# Patient Record
Sex: Male | Born: 1935 | Race: White | Hispanic: No | Marital: Married | Smoking: Former smoker
Health system: Southern US, Community
[De-identification: ages and names within clinical notes are randomized; demographics above are authoritative.]

## PROBLEM LIST (undated history)

## (undated) DIAGNOSIS — K5282 Eosinophilic colitis: Secondary | ICD-10-CM

## (undated) DIAGNOSIS — E7800 Pure hypercholesterolemia, unspecified: Secondary | ICD-10-CM

## (undated) DIAGNOSIS — I10 Essential (primary) hypertension: Secondary | ICD-10-CM

## (undated) DIAGNOSIS — K219 Gastro-esophageal reflux disease without esophagitis: Secondary | ICD-10-CM

## (undated) DIAGNOSIS — M1990 Unspecified osteoarthritis, unspecified site: Secondary | ICD-10-CM

## (undated) DIAGNOSIS — H9190 Unspecified hearing loss, unspecified ear: Secondary | ICD-10-CM

## (undated) DIAGNOSIS — F0390 Unspecified dementia without behavioral disturbance: Secondary | ICD-10-CM

## (undated) HISTORY — DX: Eosinophilic colitis: K52.82

## (undated) HISTORY — PX: TONSILLECTOMY: SUR1361

## (undated) HISTORY — PX: KNEE ARTHROSCOPY: SUR90

---

## 2000-12-07 ENCOUNTER — Ambulatory Visit (HOSPITAL_COMMUNITY): Admission: RE | Admit: 2000-12-07 | Discharge: 2000-12-07 | Payer: Self-pay | Admitting: Orthopedic Surgery

## 2002-12-16 ENCOUNTER — Ambulatory Visit (HOSPITAL_COMMUNITY): Admission: RE | Admit: 2002-12-16 | Discharge: 2002-12-16 | Payer: Self-pay | Admitting: Orthopaedic Surgery

## 2002-12-16 ENCOUNTER — Encounter: Payer: Self-pay | Admitting: Orthopaedic Surgery

## 2003-07-18 ENCOUNTER — Ambulatory Visit (HOSPITAL_COMMUNITY): Admission: RE | Admit: 2003-07-18 | Discharge: 2003-07-18 | Payer: Self-pay | Admitting: Otolaryngology

## 2003-10-09 ENCOUNTER — Inpatient Hospital Stay (HOSPITAL_COMMUNITY): Admission: RE | Admit: 2003-10-09 | Discharge: 2003-10-13 | Payer: Self-pay | Admitting: Orthopedic Surgery

## 2004-11-26 ENCOUNTER — Ambulatory Visit (HOSPITAL_COMMUNITY): Admission: RE | Admit: 2004-11-26 | Discharge: 2004-11-26 | Payer: Self-pay | Admitting: Family Medicine

## 2005-02-07 ENCOUNTER — Ambulatory Visit (HOSPITAL_COMMUNITY): Admission: RE | Admit: 2005-02-07 | Discharge: 2005-02-07 | Payer: Self-pay | Admitting: Family Medicine

## 2005-10-17 ENCOUNTER — Ambulatory Visit (HOSPITAL_COMMUNITY): Admission: RE | Admit: 2005-10-17 | Discharge: 2005-10-17 | Payer: Self-pay | Admitting: Family Medicine

## 2006-01-30 ENCOUNTER — Ambulatory Visit (HOSPITAL_COMMUNITY): Admission: RE | Admit: 2006-01-30 | Discharge: 2006-01-30 | Payer: Self-pay | Admitting: Family Medicine

## 2007-01-11 ENCOUNTER — Encounter (INDEPENDENT_AMBULATORY_CARE_PROVIDER_SITE_OTHER): Payer: Self-pay | Admitting: Urology

## 2007-01-11 ENCOUNTER — Inpatient Hospital Stay (HOSPITAL_COMMUNITY): Admission: RE | Admit: 2007-01-11 | Discharge: 2007-01-13 | Payer: Self-pay | Admitting: Urology

## 2007-08-05 HISTORY — PX: COLONOSCOPY: SHX174

## 2007-11-02 ENCOUNTER — Ambulatory Visit: Payer: Self-pay | Admitting: Gastroenterology

## 2007-11-09 ENCOUNTER — Ambulatory Visit: Payer: Self-pay | Admitting: Gastroenterology

## 2007-11-09 ENCOUNTER — Ambulatory Visit (HOSPITAL_COMMUNITY): Admission: RE | Admit: 2007-11-09 | Discharge: 2007-11-09 | Payer: Self-pay | Admitting: Gastroenterology

## 2008-07-26 ENCOUNTER — Ambulatory Visit (HOSPITAL_COMMUNITY): Admission: RE | Admit: 2008-07-26 | Discharge: 2008-07-26 | Payer: Self-pay | Admitting: Family Medicine

## 2008-09-07 ENCOUNTER — Emergency Department (HOSPITAL_COMMUNITY): Admission: EM | Admit: 2008-09-07 | Discharge: 2008-09-07 | Payer: Self-pay | Admitting: Emergency Medicine

## 2010-08-24 ENCOUNTER — Encounter: Payer: Self-pay | Admitting: Otolaryngology

## 2010-08-25 ENCOUNTER — Encounter: Payer: Self-pay | Admitting: Family Medicine

## 2010-11-19 LAB — COMPREHENSIVE METABOLIC PANEL
ALT: 27 U/L (ref 0–53)
AST: 34 U/L (ref 0–37)
Albumin: 4 g/dL (ref 3.5–5.2)
Alkaline Phosphatase: 61 U/L (ref 39–117)
BUN: 10 mg/dL (ref 6–23)
CO2: 23 mEq/L (ref 19–32)
Calcium: 9.3 mg/dL (ref 8.4–10.5)
Chloride: 102 mEq/L (ref 96–112)
Creatinine, Ser: 0.8 mg/dL (ref 0.4–1.5)
GFR calc Af Amer: >60 mL/min (ref >60)
GFR calc non Af Amer: >60 mL/min (ref >60)
Glucose, Bld: 306 mg/dL — ABNORMAL HIGH (ref 70–99)
Potassium: 4.6 mEq/L (ref 3.5–5.1)
Sodium: 133 mEq/L — ABNORMAL LOW (ref 135–145)
Total Bilirubin: 0.4 mg/dL (ref 0.3–1.2)
Total Protein: 7.2 g/dL (ref 6.0–8.3)

## 2010-11-19 LAB — DIFFERENTIAL
Basophils Absolute: 0 10*3/uL (ref 0.0–0.1)
Basophils Relative: 0 % (ref 0–1)
Eosinophils Absolute: 0.1 10*3/uL (ref 0.0–0.7)
Eosinophils Relative: 1 % (ref 0–5)
Lymphocytes Relative: 21 % (ref 12–46)
Lymphs Abs: 1.6 10*3/uL (ref 0.7–4.0)
Monocytes Absolute: 0.5 10*3/uL (ref 0.1–1.0)
Monocytes Relative: 6 % (ref 3–12)
Neutro Abs: 5.7 10*3/uL (ref 1.7–7.7)
Neutrophils Relative %: 72 % (ref 43–77)

## 2010-11-19 LAB — CBC
HCT: 38.2 % — ABNORMAL LOW (ref 39.0–52.0)
Hemoglobin: 13 g/dL (ref 13.0–17.0)
MCHC: 34 g/dL (ref 30.0–36.0)
MCV: 81.9 fL (ref 78.0–100.0)
Platelets: 160 10*3/uL (ref 150–400)
RBC: 4.66 MIL/uL (ref 4.22–5.81)
RDW: 13.2 % (ref 11.5–15.5)
WBC: 7.9 10*3/uL (ref 4.0–10.5)

## 2010-11-19 LAB — PROTIME-INR
INR: 1 (ref 0.00–1.49)
Prothrombin Time: 13.3 seconds (ref 11.6–15.2)

## 2010-12-17 NOTE — Op Note (Signed)
[name], [name]              ACCOUNT NO.:  [id number]   MEDICAL RECORD NO.:  [id number]          PATIENT TYPE:  AMB   LOCATION:  DAY                           FACILITY:  [facility]   PHYSICIAN:  Kassie Mends, M.D.      DATE OF BIRTH:  11/23/1929   DATE OF PROCEDURE:  11/09/2007  DATE OF DISCHARGE:                                PROCEDURE NOTE   REFERRING PHYSICIAN:  Angus G. Renard Matter, MD   PROCEDURE:  Colonoscopy.   INDICATION FOR EXAMINATION:  [name] is a 77 year old male reportedly  with a history of polyps.  He has had change in his bowel habits.  He  says it is more difficult for him to pass stools.  He has a significant  past medical history of diabetes.  He takes cyclobenzaprine and  methocarbamol as an outpatient.   FINDINGS:  1. Small internal hemorrhoids.  2. Pancolonic diverticulosis, most pronounced in the left colon.   RECOMMENDATIONS:  1. [name] is to continue his chewable fiber supplements.  If his      chewable fiber supplements do not help with his difficulty having      bowel movements, then we would be more than happy to see him in      followup in 4-6 weeks.  2. He should follow a high-fiber diet.  He is given handout on high-      fiber diet, constipation, hemorrhoids, and diverticulosis.  3. Consider screening colonoscopy in 10-15 years.   PROCEDURE TECHNIQUE:  Physical exam was performed.  Informed consent was  obtained from the patient after explaining the benefits, risks, and  alternatives of the procedure.  The patient was connected to the monitor  and placed in left lateral position.  Continuous oxygen was provided by  nasal cannula and IV medicine administered through an indwelling  cannula.  After administration of sedation and rectal exam, the  patient's rectum was intubated  and the scope was advanced under direct visualization to the cecum.  The  scope was removed slowly by carefully examining the color, texture,  anatomy, and integrity  of the mucosa on the way out.  The patient was  recovered in endoscopy and discharged home in satisfactory condition.      Kassie Mends, M.D.  Electronically Signed     SM/MEDQ  D:  11/09/2007  T:  11/10/2007  Job:  [id number]   cc:   Angus G. Renard Matter, MD  Fax: [phone]

## 2010-12-17 NOTE — H&P (Signed)
NAMEKINGSLY, [name]              ACCOUNT NO.:  [id number]   MEDICAL RECORD NO.:  [id number]          PATIENT TYPE:  AMB   LOCATION:  DAY                           FACILITY:  [facility]   PHYSICIAN:  Dennie Maizes, M.D.   DATE OF BIRTH:  [birth year]   DATE OF ADMISSION:  01/11/2007  DATE OF DISCHARGE:  LH                              HISTORY & PHYSICAL   CHIEF COMPLAINT:  Low urinary stream, urinary frequency, nocturia.   HISTORY OF PRESENT ILLNESS:  This is a 77 year old male who is referred  to me by Dr. Megan Mans.  He has an enlarged prostate with bladder neck  obstruction for 2-3 years.  He was treated with Uroxatral 10 mg one p.o.  q.h.s. for two years.  He had acute symptomatic improvement initially.  He had the relapse of the symptoms gradually after the treatment.  The  patient still has considerable voiding difficulty and a slow urinary  stream.  He has urinary frequency times 5-6 and nocturia times three.  There is no history of dysuria, hematuria, or urolithiasis in the past.   PAST MEDICAL HISTORY:  History of hypertension, diabetes mellitus,  elevated cholesterol, BPH with bladder neck obstruction, status post  knee replacement in 2005.   MEDICATIONS:  1. Cyclobenzaprine 10 mg one p.o. t.i.d.  2. Metformin 5 mg two p.o. b.i.d.  3. Glipizide 5 mg one p.o. q. daily.  4. Avodart 0.4 mg p.o. q. daily.  5. Lisinopril 40 mg one p.o. q. daily.  6. Uroxatral 10 mg one p.o. q. daily.  7. Simvastatin 80 mg one p.o. q. daily.  8. Aspirin 81 mg one p.o. q. daily which has been stopped for the      surgery.   ALLERGIES:  None.   FAMILY HISTORY:  The family history is positive for diabetes mellitus.   PHYSICAL EXAMINATION:  VITAL SIGNS:  Height of 5 feet 10 inches, weight  198 pounds.  GENERAL:  Alert and oriented.  HEENT:  The patient is hard of hearing.  He wears hearing aids in the  left ear.  The HEENT are otherwise normal.  NECK:  No masses.  LUNGS:  The lungs are clear  to auscultation.  HEART:  Regular rate and rhythm, no murmurs.  ABDOMEN:  The abdomen is soft, no palpable flank pain or costovertebral  angle tenderness.  The bladder is not palpable.  The penis and testes  are normal.  RECTAL:  Examination shows a 35 gram benign prostate.   SUMMARY:  The patient underwent further evaluation in the office.  Urinary flow studies reveal a peak flow rate of 11 ml per second and the  mean flow rate was 6 ml per second.  The patient voided 263 ml.  The  bladder scan revealed a post residual urine of 50 cc.  A cystoscopy was  done.  The urethra was normal.  There was moderate hypertrophy involving  both lateral lobes of the prostate with bladder neck obstruction.   IMPRESSION:  BPH with bladder neck obstruction, prostatism.   PLAN:  I have discussed with the patient regarding management  options.  He has not responded to medications.  I recommended a TURP and the  patient is agreeable.  I have explained to the patient regarding the  diagnosis, operative details of, alternative treatment, outcome,  possible risks and complications, and he is agreed for the procedure to  be done.  He will be admitted to te hospital in the postoperative  period.      Dennie Maizes, M.D.  Electronically Signed     SK/MEDQ  D:  01/10/2007  T:  01/10/2007  Job:  [id number]   cc:   Dennie Maizes, M.D.  Fax: [phone]

## 2010-12-17 NOTE — Consult Note (Signed)
[name], [name]              ACCOUNT NO.:  [id number]   MEDICAL RECORD NO.:  [id number]          PATIENT TYPE:  INP   LOCATION:  A323                          FACILITY:  [facility]   PHYSICIAN:  Angus G. Renard Matter, MD   DATE OF BIRTH:  1930/05/20   DATE OF CONSULTATION:  01/11/2007  DATE OF DISCHARGE:                                 CONSULTATION   HISTORY OF PRESENT ILLNESS:  A 77 year old male patient of Dr. Rito Ehrlich.  Has a history of bladder neck obstruction for 2-3 years and enlarged  prostate.  Had been treated with Uroxatral, but he had considerable  voiding difficulty and slowing of urinary strain.  Nocturia.  He had  TURP today by Dr. Rito Ehrlich and appears to be fairly comfortable with no  excess bleeding.   PAST MEDICAL HISTORY:  1. History of diabetes.  2. Hypertension.  3. Hyperlipidemia.  4. Previous history of knee replacement 2005.   PHYSICAL EXAMINATION:  GENERAL:  Alert male in no acute distress.  VITAL SIGNS:  BP 118/68, pulse 82.  HEENT:  Negative.  NECK:  Supple.  No JVD.  No thyroid abnormalities.  LUNGS:  Clear to P&A.  HEART:  Regular rhythm.  No murmurs.  ABDOMEN:  No palpable organs or masses   ASSESSMENT:  The patient is in an immediate postop period following  TURP.  Has been stable.  He does have a history of non-insulin dependent  diabetes.   SUGGESTIONS:  Continue current regimen.  Will keep the patient on low  carbohydrate diet.  Will restart his oral medications for his diabetes  tomorrow, but today will go with Accu-Cheks a.c. and h.s. and Humalog  insulin according to sliding scale.      Angus G. Renard Matter, MD  Electronically Signed     AGM/MEDQ  D:  01/11/2007  T:  01/12/2007  Job:  [id number]

## 2010-12-17 NOTE — Op Note (Signed)
NAMEHAIM, HANSSON              ACCOUNT NO.:  [id number]   MEDICAL RECORD NO.:  [id number]          PATIENT TYPE:  INP   LOCATION:  A323                          FACILITY:  [facility]   PHYSICIAN:  Dennie Maizes, M.D.   DATE OF BIRTH:  January 18, 1930   DATE OF PROCEDURE:  01/11/2007  DATE OF DISCHARGE:                               OPERATIVE REPORT   PREOPERATIVE DIAGNOSIS:  Benign prostate hypertrophy with bladder neck  obstruction, prostatism.   POSTOPERATIVE DIAGNOSIS:  Benign prostate hypertrophy with bladder neck  obstruction, prostatism.   OPERATIVE PROCEDURE:  Transurethral resection of the prostate.   ANESTHESIA:  Spinal.   SURGEON:  Dennie Maizes, M.D.   COMPLICATIONS:  None.   ESTIMATED BLOOD LOSS:  100 mL.   DRAINS:  22-French triple lumen Foley catheter 30 mL balloon in the  bladder.   COMPLICATIONS:  None.   INDICATIONS FOR PROCEDURE:  This 77 year old male had enlarged prostate  with bladder neck obstruction.  He did not respond to combination  medical therapy with a Uroxatral and  Avodart.  He was taken to  operating room today for transurethral resection of prostate.   DESCRIPTION OF PROCEDURE:  Spinal anesthesia was induced and the patient  was placed on the OR table in the dorsal lithotomy position.  The lower  abdomen and genitalia were prepped and draped in a sterile fashion.  Cystoscopy was done with to 25-French scope.  Urethra was normal.  There  is moderate hypertrophy involving both lateral lobes of prostate with  obstruction of bladder neck area.  The bladder was found to be normal.   Cystoscope was removed.  The urethra dilated to 30-French with Sissy Hoff  sounds.  A 28-French Iglesias resectoscope with continuous bladder  irrigation was then inserted into the bladder.  Obstructing adenoma at 6  o'clock position was resected first.  The right and left lateral lobes  were then dissected up to the level of capsule.  Finally, dissection was  done in  the anterior midline area.  All the prostate chips were  evacuated and sent for histopathological examination.  The prostate  fossa was then closely examined and fulguration was obtained by  rollerball electrode.  There was no active bleeding at this time.  The  resectoscope was removed.  A 22-French triple  lumen Foley catheter 30 mL balloon was inserted in the bladder.  Continuous bladder irrigation started and the returns were clear.  Estimated blood loss was about 100 mL.  The patient was transferred to  the PACU in a satisfactory condition.      Dennie Maizes, M.D.  Electronically Signed     SK/MEDQ  D:  01/11/2007  T:  01/11/2007  Job:  [id number]   cc:   Angus G. Renard Matter, MD  Fax: [phone]

## 2010-12-17 NOTE — Consult Note (Signed)
[name], Kramer              ACCOUNT NO.:  [id number]   MEDICAL RECORD NO.:  [id number]         PATIENT TYPE:  AMB   LOCATION:  DAY                           FACILITY:  [facility]   PHYSICIAN:  Kassie Mends, M.D.      DATE OF BIRTH:  May 30, 1930   DATE OF CONSULTATION:  11/02/2007  DATE OF DISCHARGE:                                 CONSULTATION   REFERRING PHYSICIAN:  Angus G. Renard Matter, MD   REASON FOR CONSULTATION:  Chronic constipation/need for colonoscopy.   HISTORY OF PRESENT ILLNESS:  [name] is a 77 year old male who was  referred through the courtesy of Dr. Renard Matter for a screening  colonoscopy. Upon further triage he was found to have a recent bout of  constipation.  He has a several-month history of chronic dry hard  stools, although he does seem to have a bowel movement about once a day.  He denies any significant straining.  Denies any abdominal pain or  bloating.  Denies any rectal bleeding or melena.  He believes he had  polyps on a previous colonoscopy, however his wife states that [place] was  [place] of [place] and they are unsure of the findings, and it was at  least 10 years ago or more.  He denies any nausea or vomiting, denies  any problems with his appetite.  He has started over-the-counter stool  softeners this week p.r.n. as well as chewable fiber supplement.   PAST MEDICAL AND SURGICAL HISTORY:  Diabetes mellitus, hypertension,  hyperlipidemia, chronic GERD, arthritis, hard of hearing, tonsillectomy,  right ankle surgery.   CURRENT MEDICATIONS:  1. Aspirin 81 mg daily.  2. Metformin 500 mg nightly.  3. Glipizide 5 mg nightly.  4. Cyclobenzaprine 5 mg t.i.d.  5. Lisinopril 20 mg daily.  6. simvastatin 40 mg daily.  7. Methocarbamol 750 mg q.i.d.  8. Prilosec 20 mg daily.  9. Celebrex 20 mg daily.  10.Chewable fiber once daily.  11.Over-the-counter stool softeners p.r.n.   ALLERGIES:  NO KNOWN DRUG ALLERGIES.   FAMILY HISTORY:  There is no known family  history of colon carcinoma,  liver or chronic GI problems.  Mother deceased in her 45s secondary to  cancer of unknown etiology.  Father deceased at 29 secondary to CVA.  He  has one brother with diabetes mellitus, two siblings who he is unsure  what happened to them and four healthy sisters.   SOCIAL HISTORY:  He is married.  He has three grown healthy children.  He is a retired Academic librarian.  He has a remote history of tobacco use.  He  consumes beer, one to two beers in the evenings when he takes a  notion.  He admits it is not every day, however has difficulty  quantifying how often.   REVIEW OF SYSTEMS:  See HPI.  Otherwise is negative.   PHYSICAL EXAMINATION:  VITAL SIGNS:  Weight 197 pounds, height 70-1/2  inches, temperature 98 degrees, blood pressure 140/82 and pulse 92.  GENERAL:  [name] is an elderly Caucasian male who is alert,  oriented, pleasant, cooperative in no acute distress.  HEENT:  Pupils equal.  Sclerae clear.  Conjunctivae pink.  Oropharynx  pink and moist without any lesions.  NECK:  He does have an approximate 2 cm soft tissue, possible lipoma  just anterior to the right ear.  Neck is supple without thyromegaly.  CHEST:  Heart regular rate rhythm.  Normal S1 and S2.  No murmurs,  clicks, rubs or gallops.  LUNGS:  Clear to auscultation bilaterally.  ABDOMEN:  Positive bowel sounds x4.  No bruits auscultated.  Soft,  nontender without palpable mass or megaly.  No rashes or guarding.  EXTREMITIES:  Without clubbing or edema bilaterally.  SKIN:  Warm and dry without any rash or jaundice.   IMPRESSION:  Mr.  Weide is a 77 year old male with a history of chronic  hard stools.  He has responded well to over-the-counter stool softeners  and chewable fiber which he started 1 week ago.  He gives history of  polyps on a colonoscopy that was [place] over 10 years ago out of state but  is unsure as to whether these were adenomatous.  Would proceed with  colonoscopy for  further evaluation and surveillance.   PLAN:  1. Continue chewable fiber supplement daily.  2. Continue stool softeners p.r.n.  3. Colonoscopy with Dr. Cira Servant in the near future.  I have discussed      the procedure including risks and benefits which include but not      limited to bleeding, infection, perforation, drug reaction.  He      agrees with plan.  Consent will be obtained.  4. He is to take half of his metformin and glipizide the night prior      to the procedure.  5. Constipation literature given for his review.   Thank you Dr. Renard Matter for allowing [place] to participate in the care of Mr.  Melroy.      Lorenza Burton, N.P.      Kassie Mends, M.D.  Electronically Signed    KJ/MEDQ  D:  11/02/2007  T:  11/02/2007  Job:  [id number]   cc:   Kassie Mends, M.D.  [address]  [place] , [place] [address]   Ishmael Holter. Renard Matter, MD  Fax: [phone]

## 2010-12-20 NOTE — H&P (Signed)
[name], CARICO NO.:  [id number]   MEDICAL RECORD NO.:  [id number]                   PATIENT TYPE:  INP   LOCATION:  0457                                 FACILITY:  [facility]   PHYSICIAN:  Ollen Gross, M.D.                 DATE OF BIRTH:  [birth year]   DATE OF ADMISSION:  10/09/2003  DATE OF DISCHARGE:                                HISTORY & PHYSICAL   CHIEF COMPLAINT:  Left knee pain.   HISTORY OF PRESENT ILLNESS:  The patient is a 77 year old male seen by Dr.  Lequita Halt for ongoing left knee pain.  It has been progressive over the past  two years now, no specific injury leading to this.  He is a very active  hunter and fisherman, but this pain progressively over the past couple of  years has started to interfere with his mobility and activity.  He has been  seen by Dr. Hilda Lias in [place] and was told that he needed knee  replacement.  He has had injections, but were not beneficial.  Dr. Butch Penny recommended to be referred over to Dr. Lequita Halt.  The patient was  seen and evaluated and found to have bone-on-bone changes with significant  spurring.  It is felt he has end-stage arthritis and could benefit from  undergoing surgical intervention.  Risks and benefits discussed, and the  patient has elected to proceed with surgery.   ALLERGIES:  No known drug allergies.   CURRENT MEDICATIONS:  1. Relafen.  2. UroXatral.  3. Vytorin.  4. Travatan eye drops.  5. Lisinopril.  6. Aspirin.  7. Metformin.   PAST MEDICAL HISTORY:  1. Diabetes.  2. Hypertension.  3. Glaucoma.  4. Benign prostatic hypertrophy.  5. Difficulty hearing.   PAST SURGICAL HISTORY:  1. Left knee arthroscopy x2.  2. ORIF of the right ankle.  3. Tonsillectomy at age 89.   SOCIAL HISTORY:  Married, three children.  No tobacco, occasional beer.  He  is retired from the [employer].   FAMILY HISTORY:  Father deceased at age 64 old age with a history of  stroke.  Mother deceased in her late 62s with cancer.   REVIEW OF SYSTEMS:  GENERAL:  No fevers, chills, night sweats.  NEUROLOGIC:  No seizures, syncope, paralysis.  RESPIRATORY:  No shortness of breath,  productive cough, hemoptysis.  CARDIOVASCULAR:  No chest pain, angina,  orthopnea.  GASTROINTESTINAL:  Has had a recent stomach virus.  This has  resolved at this time.  No current nausea, vomiting, diarrhea, constipation.  GENITOURINARY:  No dysuria, hematuria, discharge.  MUSCULOSKELETAL:  Pertinent of the knee found in the history of present illness.   PHYSICAL EXAMINATION:  VITAL SIGNS:  Pulse 106, respirations 16, blood  pressure 100/60.  GENERAL:  A 77 year old white male, well-developed, well-nourished, in no  acute distress.  Alert, oriented, cooperative, and pleasant.  HEENT:  Normocephalic, atraumatic.  Pupils equal, round, reactive.  Oropharynx clear.  EOMs are intact.  He is noted to have bilateral hearing  aids.  NECK:  Supple.  No carotid bruits.  CHEST:  Clear anterior and posterior chest walls.  No wheezes, rhonchi, or  rales noted.  HEART:  A grade 3/6 pansystolic murmur best heard at pulmonic and Erb's  point, also at aortic and mitral point.  ABDOMEN:  Soft, nontender, slightly round, bowel sounds are present.  RECTAL:  Not done, not pertinent to present illness.  BREASTS:  Not done, not pertinent to present illness.  GENITOURINARY:  Not done, not pertinent to present illness.  EXTREMITIES:  Significant to that of the left knee.  Just has a slight varus  malalignment, marked crepitus noted on passive range of motion.  Range of  motion is 5 to 115 degrees.  No swelling.  He is more tender medially than  he is laterally.   IMPRESSION:  1. Osteoarthritis, left knee.  2. Diabetes mellitus.  3. Hypertension.  4. Glaucoma.  5. Benign prostatic hypertrophy.  6. Difficulty hearing.   PLAN:  The patient will be admitted to [facility] to undergo a  left  total knee replacement arthroplasty.  Surgery will be performed by Dr.  Ollen Gross.     Alexzandrew L. Julien Girt, P.A.              Ollen Gross, M.D.    ALP/MEDQ  D:  10/10/2003  T:  10/11/2003  Job:  [id number]   cc:   Angus G. Renard Matter, M.D.  [address]  [place]  [place] [address]  Fax: [phone]

## 2010-12-20 NOTE — Op Note (Signed)
Brandon, Kramer NO.:  [id number]   MEDICAL RECORD NO.:  [id number]                   PATIENT TYPE:  INP   LOCATION:  X002                                 FACILITY:  [facility]   PHYSICIAN:  Ollen Gross, M.D.                 DATE OF BIRTH:  November 18, 1929   DATE OF PROCEDURE:  10/09/2003  DATE OF DISCHARGE:                                 OPERATIVE REPORT   PREOPERATIVE DIAGNOSIS:  Osteoarthritis of the left knee.   POSTOPERATIVE DIAGNOSES:  Osteoarthritis of the left knee.   OPERATION/PROCEDURE:  Left total knee arthroplasty.   SURGEON:  Ollen Gross, M.D.   ASSISTANT:  Alexzandrew L. Perkins, P.A.-C.   ANESTHESIA:  General with postoperative pain pump.   ESTIMATED BLOOD LOSS:  Minimal.   DRAINS:  Hemovac x1.   COMPLICATIONS:  None.   TOURNIQUET TIME:  43 minutes at 300 mmHg.   DISPOSITION:  Stable to recovery.   BRIEF CLINICAL NOTE:  [name] is a 77 year old male with end-stage  osteoarthritis of the left knee with pain refractory to nonoperative  management.  He presents now for left total knee arthroplasty.   DESCRIPTION OF PROCEDURE:  After the successful administration of general  anesthesia, tourniquet was placed high on the left thigh and left lower  extremity prepped and draped in the usual sterile fashion.  The extremity  was wrapped in Esmarch, knee flexed, tourniquet inflated to 300 mmHg.   A midline incision was made with a 10-blade through the subcutaneous tissue  to the level of the extensor mechanism.  A fresh blade was used to make a  medial parapatellar arthrotomy and the soft tissue of proximal medial tibia  subperiosteally elevated to the joint line with the knife into the  semimembranosus bursa with a curved osteotome.  Soft tissue over the lateral  tibia was also elevated with attention being paid to avoiding the patellar  tendon on the tibial tubercle.  Patella was then everted and knee flexed 90  degrees and  the ACL and PCL removed.  Drill was used to create a starting  hole at the distal femur.  The canal was irrigated.  A five-degree left  valgus alignment guide was placed.  Referencing with the posterior condyles,  rotation was marked and the block pinned to remove 10 mm off the distal  femur.  Distal femoral resection was made with an oscillating saw.  Sizing  block was then  placed and size 5 was the most appropriate.  Rotation is  marked up the epicondylar axis and AP cutting block placed.  The anterior  and posterior cuts were subsequently made.   Tibia was subluxed forward and the menisci removed.  Extra medullary tibial  alignment guide placed and referencing proximally at the medial aspect of  the tibial tubercle and distally along the second metatarsal axis and tibial  crest.  A block was subsequently  pinned to remove 10 mm of the nondeficient  lateral side.  Tibial resection was made with an oscillating saw.  The size  5 was most appropriate tibia component.  Then the proximal tibia was  prepared with a modular drill and keel punch.  Femoral preparation was then  completed with the intercondylar and chamfer cuts.   The size 5 posterior stabilized posterior femoral was placed with a the size  5 mobile bearing tibial trial and a 10 mm posterior stabilized rotating  platform insert trial.  Full extension was achieved with excellent varus and  valgus balance throughout the full range of motion.  The patella was then  everted.  Thickness measured to be 26 mm free-handed, free-hand resection  taken down to 15 mm, 41 template placed, lug holes were drilled, trial  patella was placed and it tracks normally.  Osteophytes were removed off the  posterior femur with the trial in place.  All trials were then removed and  the cut bone surfaces were then prepared with pulsatile lavage.  Cement was  mixed and once ready for implantation, the size 5 mobile bearing tibial  tray, size 5 posterior  stabilized femur and 41 patella were cemented into  place and patella held with a clamp.  The trial 10 mm insert was placed and  the knee held in full extension and all extruded cement removed.  Once the  cement is fully hardened, then a permanent 10 mm posterior stabilized  rotating platform insert was placed.  The wounds were then copiously  irrigated with antibiotic solution and extensor mechanism closed over a  Hemovac drain with interrupted #1 PDS.  The tourniquet released after a  total time of 43 minutes.  Flexion against gravity is 135 degrees.  Subcutaneous tissues were then closed with interrupted 2-0 Vicryl,  subcuticular running 4-0 Monocryl.  Incision was cleaned and dried and Steri-  Strips and a bulky sterile dressing applied.  Prior to this the Marcaine  pain catheter was placed subcutaneously.  The pump is then hooked up for the  pain catheter.  He was then awakened and transported to recovery in stable  condition.                                               Ollen Gross, M.D.    FA/MEDQ  D:  10/09/2003  T:  10/09/2003  Job:  [id number]

## 2010-12-20 NOTE — Discharge Summary (Signed)
NAMEBARRY, FAIRCLOTH NO.:  [id number]   MEDICAL RECORD NO.:  [id number]                   PATIENT TYPE:  INP   LOCATION:  0457                                 FACILITY:  [facility]   PHYSICIAN:  Ollen Gross, M.D.                 DATE OF BIRTH:  1930-02-12   DATE OF ADMISSION:  10/09/2003  DATE OF DISCHARGE:  10/13/2003                                 DISCHARGE SUMMARY   ADMISSION DIAGNOSES:  1. Osteoarthritis left knee.  2. Diabetes mellitus.  3. Hypertension.  4. Glaucoma.  5. Benign prostatic hypertrophy.  6. Difficulty hearing.   DISCHARGE DIAGNOSES:  1. Osteoarthritis left knee status post left total knee arthroplasty.  2. Mild postoperative blood loss anemia that did not require a transfusion.  3. Postoperative hyponatremia, improved.  4. Postoperative tachycardia, improved.  5. Postoperative fluid overload, improved.  6. Diabetes mellitus.  7. Hypertension.  8. Glaucoma.  9. Benign prostatic hypertrophy.  10.      Difficulty hearing.   PROCEDURE:  The patient was taken to the OR on October 09, 2003 and underwent a  left total knee arthroplasty. Surgeon:  Ollen Gross, M.D.  Assistant:  Alexzandrew L. Perkins, P.A.-C.  Anesthesia:  General.  Postoperative pain  pump.  Minimal blood loss.  Hemovac drain x1.  Tourniquet time 43 minutes at  300 mmHg.   CONSULTATIONS:  None.   BRIEF HISTORY:  [name] is a 77 year old male with an end-stage arthritis  of the left knee.  The pain has been refractory of nonoperative management  and now presents for a total knee arthroplasty.   LABORATORY DATA:  CBC preop showed a hemoglobin of 13.5, hematocrit of 39.9,  white cell count 5.4, red cell count 4.62.  Differential within normal  limits.  Postop H&H 11.3 and 32.9.  Last noted H&H 9 and 26.5.  PT/PTT preop  12.2 and 33 respectively with an INR of 0.9.  Serial pro time was followed.  Last noted PT/INR 15.7 and 1.4.  Chem panel on admission:   Elevated glucose  of 239, minimally elevated AST and ALT of 43 and 41 respectively.  Serial  BMETs were followed.  Sodium dropped from 135 to 129 back up to 132.  Her  electrolytes remained within normal limits.  Urinalysis positive glucose,  otherwise negative.  Blood group type O positive.   EKG preop dated October 04, 2003, normal sinus rhythm, normal EKG, no previous  tracing, confirmed by Dr. Olga Millers.  Follow up EKG on October 11, 2003  sinus tachycardia and possible infarct age-undetermined.  Since last  tracing, rate is faster.  Confirmed by Dr. Peter Swazil[place].  Preop chest x-ray  dated October 04, 2003 no active disease.   HOSPITAL COURSE:  The patient was admitted to [facility], taken to  the OR, and underwent the above-stated procedure without complication.  The  patient tolerated the  procedure well, later sent to recovery, and then to  the orthopedic floor for postoperative care.  Vital signs were followed.  The patient was given 24 hours of postop IV antibiotics in the form of  Ancef, Coumadin for DVT prophylaxis, started back on home meds.  PT and OT  were consulted postop, placed weightbearing as tolerated to the left lower  extremity.  Hemovac drain which was placed at the time of surgery was pulled  on postop day #1 without difficulty.  The patient was noted to have a slight  elevated pulse rate of 117 which was felt to be associated with  postoperative pain.  Abdomen was slightly distended but the patient was  passing flatus.  By day #2, pulse rate had gotten up to 140 through the  night and was still 125.  EKG was checked, showed to be in sinus rhythm  although tachycardic type rhythm with the rate faster.  Was noted the  patient also has some volume fluid overload felt may be an influence on the  possible tachycardia.  Denies any chest pain, or shortness of breath, or  palpitations.  Denied any nausea and vomiting.  The patient was passing more  flatus, which the  abdomen was softer and felt better.  Did undergo some mild  diuresis.  Diuresed the fluid very well.  From a therapy standpoint, the  patient actually did very well.  Was up ambulating approximately 80 feet by  day #2 and then 60 feet by day #3.  Starting moving bowels by day #3.  Dressing change initiated on postop day #2.  Incision was healing well.  The  pain pump was also removed.  PCA and IV's were discontinued on day #2.  By  day #3 continued to do well.  The patient was progressing so well that it  was felt would probably do better and well enough to go home by the  following day.  By day #4, pulse rate had continued to climb down and was  back down to 108 only just slight tachycardia as compared to 2 nights  before.  Ambulating much better.  Pain under control with no complaints and  was discharged home.   DISCHARGE MEDICATIONS AND PLAN:  1. The patient was discharged home on October 13, 2003.  2. Discharge diagnoses please see above.  3. Discharge medications:  Percocet, Robaxin, Coumadin.   DIET:  Diabetic diet, low sodium diet.   ACTIVITY:  Weightbearing as tolerated.  Total knee protocol.  Home health  PT, home health Nursing.   DISPOSITION:  To home.   FOLLOW UP:  In 2 weeks.   CONDITION ON DISCHARGE:  Improved.     Alexzandrew L. Julien Girt, P.A.              Ollen Gross, M.D.    ALP/MEDQ  D:  11/15/2003  T:  11/15/2003  Job:  [id number]   cc:   Angus G. Renard Matter, M.D.  [address]  [place]  [place] [address]  Fax: [phone]

## 2010-12-20 NOTE — Discharge Summary (Signed)
[name], [name]              ACCOUNT NO.:  [id number]   MEDICAL RECORD NO.:  [id number]          PATIENT TYPE:  INP   LOCATION:  A323                          FACILITY:  [facility]   PHYSICIAN:  Dennie Maizes, M.D.   DATE OF BIRTH:  12/26/1929   DATE OF ADMISSION:  01/11/2007  DATE OF DISCHARGE:  06/11/2008LH                               DISCHARGE SUMMARY   CONSULTING PHYSICIAN:  Brandon G. Renard Matter, MD.   FINAL DIAGNOSES:  1. 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  2. Prostatism.  3. Type 2 diabetes mellitus.  4. Hypertension.  5. Hypercholesterolemia.   OPERATIVE PROCEDURE:  Transverse resection of the prostate done on January 11, 2007.   COMPLICATIONS:  None.   DISCHARGE SUMMARY:  This 77 year old male was referred to me by Dr.  Renard Kramer.  He has enlarged prostate with bladder neck obstruction for 2-3  years.  He was treated with Uroxatral 10 mg one p.o. nightly for 2 years  and he had good symptomatic improvement initially.  He  had worsening  symptoms  for the past 1 year.  He had been treated with Avodart  without much improvement.  Still has considerable voiding difficulty and  slow urinary stream.  He has urinary frequency x5-6 and nocturia x3.  There is no history of dysuria, hematuria or urolithiasis in the past.   PAST MEDICAL HISTORY:  1. History of hypertension.  2. Diabetes mellitus.  3. Elevated cholesterol.  4. BPH with bladder neck obstruction.  5. Status post knee replacement in 2005.   MEDICATIONS:  1. Cyclobenzaprine 10 mg one p.o. t.i.d.  2. Metformin 5 mg two p.o. b.i.d.  3. Glipizide 5 mg one p.o. daily.  4. __________  0.4 mg one p.o. daily.  5. Lisinopril 40 mg one p.o. daily.  6. Uroxatral 10 mg one p.o. daily.  7. Simvastatin 80 mg one p.o. daily.  8. Aspirin 81 mg one p.o. daily which has been stopped for the      surgery.   ALLERGIES:  NONE.   FAMILY HISTORY:  Positive for diabetes mellitus.   PHYSICAL EXAMINATION:   VITAL SIGNS:  Height 5 feet 10, weight 298  pounds.  HEENT:  Normal.  The patient is hard of hearing.  He uses a hearing aide  in the left.  NECK:  No masses.  LUNGS:  Clear to auscultation.  HEART:  Regular rate and rhythm.  No murmurs.  ABDOMEN:  Soft.  No palpable flank mass or costovertebral angle  tenderness.  The bladder is not palpable.  GU:  Penis and testes are normal.  RECTAL:  33 g benign prostate.   Evaluation in the office revealed BPH with bladder neck obstruction.  Urinary flow study revealed a peak flow rate of 11 mL /second and the  mean flow rate was only 6 cc/second.  The postvoid residual was 50 mL.  Cystoscopy revealed moderate prostate hypertrophy involving both lateral  lobes  of the prostate bladder with neck obstruction.   COURSE IN THE HOSPITAL:  Preop labs were within normal range.  The  patient was taken  to the OR on January 11, 2007.  Under spinal anesthesia,  transverse resection of the prostate was done.  There were no  intraoperative problems.  The patient did well in the postoperative  period.  He was seen by Dr. Renard Kramer for management of his medical  problems.  The diabetes mellitus was closely followed and he was on  sliding scale insulin.  The first postoperative day, the urine became  clear and the bladder irrigation was discontinued.  His labs within  normal limits.  The second postoperative day, Foley catheter was removed  and the patient was able to empty the bladder well.  He was discharged  and sent home on January 13, 2007.   Pathology of the prostate revealed benign prostate hyperplasia.  There  was small foci of prostatic adenocarcinoma, and Gleason Scale of 60 + 3.  Special stains are being done and the final results were not available  at the time of the discharge summary.   The patient was advised to continue regular medications.  He was also  given Cipro 500 p.o. b.i.d. for 7 days and Percocet 5/325 p.o. nightly  p.r.n. pain #20.  He  will be reviewed in the office in two weeks.   CONDITION ON DISCHARGE:  Condition of the patient at the time of  discharge is stable.  He is advised to call me for fever, chills,  voiding difficulty or gross hematuria.      Dennie Maizes, M.D.  Electronically Signed     SK/MEDQ  D:  02/08/2007  T:  02/08/2007  Job:  [id number]   cc:   Brandon G. Renard Matter, MD  Fax: [phone]

## 2011-05-22 LAB — DIFFERENTIAL
Basophils Absolute: 0
Basophils Relative: 0
Eosinophils Absolute: 0.1
Eosinophils Relative: 1
Lymphocytes Relative: 22
Lymphs Abs: 1.9
Monocytes Absolute: 0.7
Monocytes Relative: 8
Neutro Abs: 6.1
Neutrophils Relative %: 69

## 2011-05-22 LAB — BASIC METABOLIC PANEL
BUN: 12
BUN: 9
CO2: 21
CO2: 26
Calcium: 8.7
Calcium: 8.9
Chloride: 104
Chloride: 106
Creatinine, Ser: 0.86
Creatinine, Ser: 0.92
GFR calc Af Amer: >60
GFR calc Af Amer: >60
GFR calc non Af Amer: >60
GFR calc non Af Amer: >60
Glucose, Bld: 168 — ABNORMAL HIGH
Glucose, Bld: 248 — ABNORMAL HIGH
Potassium: 4.1
Potassium: 4.7
Sodium: 134 — ABNORMAL LOW
Sodium: 138

## 2011-05-22 LAB — CBC
HCT: 35.3 — ABNORMAL LOW
HCT: 36.2 — ABNORMAL LOW
Hemoglobin: 12.3 — ABNORMAL LOW
Hemoglobin: 12.6 — ABNORMAL LOW
MCHC: 34.7
MCHC: 34.8
MCV: 83.6
MCV: 84.6
Platelets: 130 — ABNORMAL LOW
Platelets: 138 — ABNORMAL LOW
RBC: 4.22
RBC: 4.27
RDW: 12.9
RDW: 13.2
WBC: 6.6
WBC: 8.9

## 2011-07-03 ENCOUNTER — Ambulatory Visit (INDEPENDENT_AMBULATORY_CARE_PROVIDER_SITE_OTHER): Payer: Medicare Other | Admitting: Orthopedic Surgery

## 2011-07-03 ENCOUNTER — Encounter: Payer: Self-pay | Admitting: Orthopedic Surgery

## 2011-07-03 VITALS — BP 126/70 | Ht 70.0 in | Wt 192.0 lb

## 2011-07-03 DIAGNOSIS — M7552 Bursitis of left shoulder: Secondary | ICD-10-CM

## 2011-07-03 DIAGNOSIS — M719 Bursopathy, unspecified: Secondary | ICD-10-CM

## 2011-07-03 DIAGNOSIS — M67919 Unspecified disorder of synovium and tendon, unspecified shoulder: Secondary | ICD-10-CM

## 2011-07-03 DIAGNOSIS — IMO0002 Reserved for concepts with insufficient information to code with codable children: Secondary | ICD-10-CM

## 2011-07-03 DIAGNOSIS — M7050 Other bursitis of knee, unspecified knee: Secondary | ICD-10-CM

## 2011-07-03 MED ORDER — HYDROCODONE-ACETAMINOPHEN 5-325 MG PO TABS: 1.0000 | ORAL_TABLET | Freq: Four times a day (QID) | ORAL | Status: AC | PRN

## 2011-07-03 NOTE — Progress Notes (Signed)
Subacromial Shoulder Injection Procedure Note  Pre-operative Diagnosis: left RC Syndrome  Post-operative Diagnosis: same  Indications: pain   Anesthesia: ethyl chloride   Procedure Details   Verbal consent was obtained for the procedure. The shoulder was prepped withalcohol and the skin was anesthetized. A 20 gauge needle was advanced into the subacromial space through posterior approach without difficulty  The space was then injected with 3 ml 1% lidocaine and 1 ml of depomedrol. The injection site was cleansed with isopropyl alcohol and a dressing was applied.  Complications:  None; patient tolerated the procedure well.  Subjective:    [name] is a 77 y.o. male who presents with left shoulder pain. The symptoms began 1 year ago. Aggravating factors: no known event. Pain is located between the neck and shoulder and deltoid. Discomfort is described as sharp/stabbing and throbbing. Symptoms are exacerbated by overhead movements, lying on the shoulder and at night . Evaluation to date: 2 injection at his primary care doctors office . Therapy to date includes: nothing specific.  The following portions of the patient's history were reviewed and updated as appropriate: allergies, current medications, past family history, past medical history, past social history, past surgical history and problem list.  Review of Systems Gastrointestinal: positive for diarrhea and dyspepsia   Objective:    BP 126/70  Ht 5\' 10"  (1.778 m)  Wt 192 lb (87.091 kg)  BMI 27.55 kg/m2 Physical Exam(12) GENERAL: normal development   CDV: pulses are normal   Skin: normal  Lymph: nodes were not palpable/normal  Psychiatric: awake, alert and oriented  Neuro: normal sensation  MSK ambulation is normal   Right shoulder: normal active ROM, no tenderness, no impingement sign  Left shoulder: non-specific diffuse tenderness about the shoulder, moderate tenderness about the glenohumeral joint, negative  for tenderness over the acromioclavicular joint, full ROM, positive for impingement sign, sensory exam normal, motor exam normal and radial pulse intact     Assessment:    Left rotator cuff tendinitis, /bursitis    Plan:    Shoulder injection. See procedure note. Physical therapy referral. hydrocodone

## 2011-07-03 NOTE — Patient Instructions (Signed)
You have received a steroid shot. 15% of patients experience increased pain at the injection site with in the next 24 hours. This is best treated with ice and tylenol extra strength 2 tabs every 8 hours. If you are still having pain please call the office.   Start  OT 

## 2011-07-09 ENCOUNTER — Ambulatory Visit (HOSPITAL_COMMUNITY)
Admission: RE | Admit: 2011-07-09 | Discharge: 2011-07-09 | Disposition: A | Discharge status: Home / Self Care | Payer: Medicare Other | Source: Ambulatory Visit | Attending: Orthopedic Surgery | Admitting: Orthopedic Surgery

## 2011-07-09 DIAGNOSIS — M7552 Bursitis of left shoulder: Secondary | ICD-10-CM

## 2011-07-09 DIAGNOSIS — M25619 Stiffness of unspecified shoulder, not elsewhere classified: Secondary | ICD-10-CM | POA: Insufficient documentation

## 2011-07-09 DIAGNOSIS — M25519 Pain in unspecified shoulder: Secondary | ICD-10-CM | POA: Insufficient documentation

## 2011-07-09 DIAGNOSIS — IMO0001 Reserved for inherently not codable concepts without codable children: Secondary | ICD-10-CM | POA: Insufficient documentation

## 2011-07-09 DIAGNOSIS — M6281 Muscle weakness (generalized): Secondary | ICD-10-CM | POA: Insufficient documentation

## 2011-07-09 NOTE — Progress Notes (Signed)
Occupational Therapy Evaluation  Patient Details  Name: [name] MRN: [id number] Date of Birth: 1930-04-14  Today's Date: 07/09/2011 Time: 1100-1140 Time Calculation (min): 40 min OT Eval 1100-1115 15' Manual Therapy 1116-1130 14' Heat 1131-1140 9' Visit#: 1  of 8   Re-eval: 08/06/11  Assessment Diagnosis: Left Shoulder Bursitis - Dr. Romeo Apple Prior Therapy: none  Past Medical History:  Past Medical History  Diagnosis Date  . Diabetes mellitus    Past Surgical History:  Past Surgical History  Procedure Date  . Tonsillectomy   . Knee arthroscopy     Subjective Symptoms/Limitations Symptoms: S:  I dont baby my arm, I keep going even thought it hurts. Limitations: Pertinent History:  [name] has had pain in his left shoulder for over a year.  He has received a few cortisone injections, with the most recent being 07/03/11.  He experiences the most pain when lying on his shoulder at night.  He has been referred to occupational therapy for evaluation and treatment. Pain Assessment Currently in Pain?: Yes Pain Score:   3 Pain Location: Shoulder Pain Orientation: Left;Posterior Pain Type: Acute pain  Assessment LUE AROM (degrees) LUE Overall AROM Comments: assessed in seated, ER and IR with 90 abd Left Shoulder Flexion  0-170: 142 Degrees Left Shoulder ABduction 0-40: 140 Degrees Left Shoulder Internal Rotation  0-70: 40 Degrees Left Shoulder External Rotation  0-90: 80 Degrees LUE Strength Left Shoulder Flexion:  (4+/5) Left Shoulder ABduction:  (4+/5) Left Shoulder Internal Rotation:  (4+/5) Left Shoulder External Rotation:  (4+/5)  O:  Exercise/Treatments  07/09/11 0700  Shoulder Exercises: Supine  Protraction PROM;10 reps  Horizontal ABduction PROM;10 reps  External Rotation PROM;10 reps  Internal Rotation PROM;10 reps  Flexion PROM;10 reps  ABduction PROM;10 reps   Modalities Modalities: Moist Heat Manual Therapy Manual Therapy: Myofascial  release Myofascial Release: MFR and manual stretching to left shoulder, focusing on supraspinatus, trapezius, and rhomboid region.  1116-1130 Moist Heat Therapy Number Minutes Moist Heat: 10 Minutes Moist Heat Location: Shoulder  Occupational Therapy Assessment and Plan OT Assessment and Plan Clinical Impression Statement: A:  Patient presents with decreased AROM and strength and increased pain and restrictions causing decreased use of LUE with ADLs. Rehab Potential: Excellent OT Frequency: Min 2X/week OT Duration: 4 weeks OT Treatment/Interventions: Self-care/ADL training;Therapeutic exercise;Manual therapy;Therapeutic activities;Patient/family education;Other (comment) (Modalities PRN, HEP:  shoulder and cervical stretches) OT Plan: P:  Skilled OT intervention to increase AROM and strength and decrease pain in his left shoulder.  Treatment Plan:  Manual Therapy as outlined above.  Ther Ex:  supine PROM and AROM.  seated  AROM, scapular tband, x to v, w arms, thumbtacks, prot/ret//elev/dep.  progress as tolerated.   Goals Short Term Goals Time to Complete Short Term Goals: 2 weeks Short Term Goal 1: Patient will be educated on a HEP. Short Term Goal 2: Patient will increase AROM by 10 in his left shoulder for increased ability to reach into overhead shelf in closet. Short Term Goal 3: Patient will decrease pain to 2/10 when lying down at night. Short Term Goal 4: Patient will decrease fascial restrictions from mod to min in his left shoulder region. Long Term Goals Time to Complete Long Term Goals: 4 weeks Long Term Goal 1: Patient will return to prior level of independence with all B/IADLs, work, and leisure activities. Long Term Goal 2: Patient will decrease pain to 1/10 in his left shoulder. 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 Long Term Goal 4: Patient will increase left shoulder strength to 5/5 for increased  ability to lift gardening equipment. Long Term Goal 5: Patient will decrease fascial restrictions to trace in his left shoulder. Additional Long Term Goals?: Yes End of Session Patient Active Problem List  Diagnoses  . Bursitis of shoulder, left  . Pain in joint, shoulder region  . Muscle weakness (generalized)   End of Session Activity Tolerance: Patient tolerated treatment well General Behavior During Session: [facility] for tasks performed Cognition: [facility] for tasks performed  Time Calculation Start Time: 1100 Stop Time: 1140 Time Calculation (min): 40 min  Shirlean Mylar, OTR/L  07/09/2011, 12:03 PM  Physician Documentation Your signature is required to indicate approval of the treatment plan as stated above.  Please sign and either send electronically or make a copy of this report for your files and return this physician signed original.  Please mark one 1.__approve of plan  2. ___approve of plan with the following conditions.   ______________________________                                                          _____________________ Physician Signature                                                                                                             Date

## 2011-07-11 ENCOUNTER — Ambulatory Visit (HOSPITAL_COMMUNITY)
Admission: RE | Admit: 2011-07-11 | Discharge: 2011-07-11 | Disposition: A | Discharge status: Home / Self Care | Payer: Medicare Other | Source: Ambulatory Visit | Attending: Orthopedic Surgery | Admitting: Orthopedic Surgery

## 2011-07-11 DIAGNOSIS — M6281 Muscle weakness (generalized): Secondary | ICD-10-CM

## 2011-07-11 DIAGNOSIS — M25519 Pain in unspecified shoulder: Secondary | ICD-10-CM

## 2011-07-11 NOTE — Progress Notes (Signed)
Occupational Therapy Treatment  Patient Details  Name: Quentyn Kolbeck MRN: [id number] Date of Birth: 04-19-1930  Today's Date: 07/11/2011 Time: 6578-4696 Time Calculation (min): 39 min Visit#: 2  of 8   Re-eval: 08/06/11 Manual Therapy 320-344 24' Therapeutic Exercise  345-359 14'    Subjective Symptoms/Limitations Symptoms: S:  My arm is doing ok. Pain Assessment Currently in Pain?: No/denies  Exercise/Treatments Supine Protraction: PROM;AROM;10 reps Horizontal ABduction: PROM;AROM;10 reps External Rotation: PROM;AROM;10 reps Internal Rotation: PROM;AROM;10 reps Flexion: PROM;AROM;10 reps ABduction: PROM;AROM;10 reps Seated Protraction: AROM;10 reps Horizontal ABduction: AROM;10 reps External Rotation: AROM;10 reps Internal Rotation: AROM;10 reps Flexion: AROM;10 reps Abduction: AROM;10 reps Therapy Ball Flexion: 15 reps ABduction: 15 reps ROM / Strengthening / Isometric Strengthening "W" Arms: x10 X to V Arms: x10  Manual Therapy Myofascial Release: MFR and manual stretching to left shoulder, focusing on supraspinatus, trapezius, and rhomboid region.   Occupational Therapy Assessment and Plan OT Assessment and Plan Clinical Impression Statement: A:  Added multiple new exercises which patient tolerated well. Rehab Potential: Excellent OT Plan: A:  Add prot/ret/elev/dep and wall wash.   Goals Short Term Goals Time to Complete Short Term Goals: 2 weeks Short Term Goal 1: Patient will be educated on a HEP. Short Term Goal 2: Patient will increase AROM by 10 in his left shoulder for increased ability to reach into overhead shelf in closet. Short Term Goal 3: Patient will decrease pain to 2/10 when lying down at night. Short Term Goal 4: Patient will decrease fascial restrictions from mod to min in his left shoulder region. Long Term Goals Time to Complete Long Term Goals: 4 weeks Long Term Goal 1: Patient will return to prior level of independence with all  B/IADLs, work, and leisure activities. Long Term Goal 2: Patient will decrease pain to 1/10 in his left shoulder. 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 Long Term Goal 4: Patient will increase left shoulder strength to 5/5 for increased ability to lift gardening equipment. Long Term Goal 5: Patient will decrease fascial restrictions to trace in his left shoulder. Additional Long Term Goals?: Yes End of Session Patient Active Problem List  Diagnoses  . Bursitis of shoulder, left  . Pain in joint, shoulder region  . Muscle weakness (generalized)   End of Session Activity Tolerance: Patient tolerated treatment well General Behavior During Session: [facility] for tasks performed Cognition: [facility] for tasks performed    Rajvir Ernster L. Shandon [place], COTA/L  07/11/2011, 4:13 PM

## 2011-07-16 ENCOUNTER — Ambulatory Visit (HOSPITAL_COMMUNITY)
Admission: RE | Admit: 2011-07-16 | Discharge: 2011-07-16 | Disposition: A | Discharge status: Home / Self Care | Payer: Medicare Other | Source: Ambulatory Visit | Attending: Orthopedic Surgery | Admitting: Orthopedic Surgery

## 2011-07-16 DIAGNOSIS — M25519 Pain in unspecified shoulder: Secondary | ICD-10-CM

## 2011-07-16 DIAGNOSIS — M6281 Muscle weakness (generalized): Secondary | ICD-10-CM

## 2011-07-16 NOTE — Progress Notes (Signed)
Occupational Therapy Treatment  Patient Details  Name: [name] MRN: [id number] Date of Birth: [birth year]  Today's Date: 07/16/2011 Time: 9147-8295 Time Calculation (min): 37 min Manual Therapy 6213-0865 16' Therapeutic Exercises [phone] 21' Visit#: 3  of 8   Re-eval: 08/06/11    Subjective Symptoms/Limitations Symptoms: S:  I think I was a little too aggressive with my stretches yesterday. Pain Assessment Currently in Pain?: Yes Pain Score:   1 Pain Location: Shoulder Pain Orientation: Left Pain Type: Acute pain  O:  Exercise/Treatments Supine Protraction: PROM;10 reps;AROM;15 reps Horizontal ABduction: PROM;10 reps;AROM;15 reps External Rotation: PROM;10 reps;AROM;15 reps Internal Rotation: PROM;10 reps;AROM;15 reps Flexion: PROM;10 reps;AROM;15 reps ABduction: PROM;10 reps;AROM;15 reps Seated Protraction: AROM;12 reps Horizontal ABduction: AROM;12 reps External Rotation: AROM;12 reps Internal Rotation: AROM;12 reps Flexion: AROM;12 reps Abduction: AROM;12 reps Therapy Ball Flexion: 20 reps ABduction: 20 reps ROM / Strengthening / Isometric Strengthening Wall Wash: 2' "W" Arms:  x 12 X to V Arms: x 12   Manual Therapy Manual Therapy: Myofascial release Myofascial Release: MFR and manual stretching to left shoulder, focusing on supraspinatus, trapezius, and rhomboid region.  [id number]   Occupational Therapy Assessment and Plan OT Assessment and Plan Clinical Impression Statement: A:  Full PROM noted this date. OT Plan: P:  Add UBE and 1 # in supine.   Goals Short Term Goals Time to Complete Short Term Goals: 2 weeks Short Term Goal 1: Patient will be educated on a HEP. Short Term Goal 1 Progress: Progressing toward goal Short Term Goal 2: Patient will increase AROM by 10 in his left shoulder for increased ability to reach into overhead shelf in closet. Short Term Goal 2 Progress: Progressing toward goal Short Term Goal 3: Patient will decrease  pain to 2/10 when lying down at night. Short Term Goal 3 Progress: Progressing toward goal Short Term Goal 4: Patient will decrease fascial restrictions from mod to min in his left shoulder region. Short Term Goal 4 Progress: Progressing toward goal Long Term Goals Time to Complete Long Term Goals: 4 weeks Long Term Goal 1: Patient will return to prior level of independence with all B/IADLs, work, and leisure activities. Long Term Goal 1 Progress: Progressing toward goal Long Term Goal 2: Patient will decrease pain to 1/10 in his left shoulder. 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 Long Term Goal 3 Progress: Progressing toward goal Long Term Goal 4: Patient will increase left shoulder strength to 5/5 for increased ability to lift gardening equipment. Long Term Goal 4 Progress: Progressing toward goal Long Term Goal 5: Patient will decrease fascial restrictions to trace in his left shoulder. Additional Long Term Goals?: Yes End of Session Patient Active Problem List  Diagnoses  . Bursitis of shoulder, left  . Pain in joint, shoulder region  . Muscle weakness (generalized)   End of Session Activity Tolerance: Patient tolerated treatment well General Behavior During Session: [facility] for tasks performed Cognition: [facility] for tasks performed   Shirlean Mylar, OTR/L  07/16/2011, 5:08 PM

## 2011-07-23 ENCOUNTER — Ambulatory Visit (HOSPITAL_COMMUNITY)
Admission: RE | Admit: 2011-07-23 | Discharge: 2011-07-23 | Disposition: A | Discharge status: Home / Self Care | Payer: Medicare Other | Source: Ambulatory Visit | Attending: Orthopedic Surgery | Admitting: Orthopedic Surgery

## 2011-07-23 DIAGNOSIS — M6281 Muscle weakness (generalized): Secondary | ICD-10-CM

## 2011-07-23 DIAGNOSIS — M25519 Pain in unspecified shoulder: Secondary | ICD-10-CM

## 2011-07-23 NOTE — Progress Notes (Signed)
Occupational Therapy Treatment  Patient Details  Name: [name] MRN: [id number] Date of Birth: 17-Jul-1930  Today's Date: 07/23/2011 Time: 4098-1191 Time Calculation (min): 43 min Visit#: 4  of 8   Re-eval: 08/06/11 Manual Therapy: [phone] 17' Therapeutic Exercise: 204-229 25'    Subjective Symptoms/Limitations Symptoms: S:  It comes and goes, sometimes it is bad sometimes not so bad. Pain Assessment Currently in Pain?: Yes Pain Score:   2 Pain Location: Shoulder Pain Orientation: Left Pain Type: Acute pain   Exercise/Treatments Supine Protraction: PROM;Strengthening;10 reps Protraction Weight (lbs): 1# Horizontal ABduction: PROM;Strengthening;10 reps Horizontal ABduction Weight (lbs): 1# External Rotation: PROM;Strengthening;10 reps External Rotation Weight (lbs): 1# Internal Rotation: PROM;Strengthening;10 reps Internal Rotation Weight (lbs): 1# Flexion: PROM;Strengthening;10 reps Shoulder Flexion Weight (lbs): 1# ABduction: PROM;Strengthening;10 reps Shoulder ABduction Weight (lbs): 1# Seated Extension: Theraband;10 reps Theraband Level (Shoulder Extension): Level 3 (Green) Retraction: Theraband;10 reps Theraband Level (Shoulder Retraction): Level 3 (Green) Row: Theraband;10 reps Theraband Level (Shoulder Row): Level 3 (Green) Protraction: AROM;15 reps Horizontal ABduction: AROM;15 reps External Rotation: AROM;15 reps;Theraband;10 reps Theraband Level (Shoulder External Rotation): Level 3 (Green) Internal Rotation: AROM;10 reps;Theraband Theraband Level (Shoulder Internal Rotation): Level 3 (Green) Flexion: AROM;15 reps Abduction: AROM;15 reps  Flexion: 20 reps ABduction: 20 reps ROM / Strengthening / Isometric Strengthening Wall Wash: 3' "W" Arms:  x 12 X to V Arms: x 12     Manual Therapy Manual Therapy: Myofascial release Myofascial Release: MFR and manual stretching to left shoulder, focusing on supraspinatus, trapezius, and rhomboid  region  Occupational Therapy Assessment and Plan OT Assessment and Plan Clinical Impression Statement: A: Added T-Band for scapular strengthening and thumb tacks; added 1 # in supine. Rehab Potential: Excellent OT Plan: P: Add UBE    Goals Short Term Goals Time to Complete Short Term Goals: 2 weeks Short Term Goal 1: Patient will be educated on a HEP. Short Term Goal 2: Patient will increase AROM by 10 in his left shoulder for increased ability to reach into overhead shelf in closet. Short Term Goal 3: Patient will decrease pain to 2/10 when lying down at night. Short Term Goal 4: Patient will decrease fascial restrictions from mod to min in his left shoulder region. Long Term Goals Time to Complete Long Term Goals: 4 weeks Long Term Goal 1: Patient will return to prior level of independence with all B/IADLs, work, and leisure activities. Long Term Goal 2: Patient will decrease pain to 1/10 in his left shoulder. 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 Long Term Goal 4: Patient will increase left shoulder strength to 5/5 for increased ability to lift gardening equipment. Long Term Goal 5: Patient will decrease fascial restrictions to trace in his left shoulder. Additional Long Term Goals?: Yes End of Session Patient Active Problem List  Diagnoses  . Bursitis of shoulder, left  . Pain in joint, shoulder region  . Muscle weakness (generalized)   End of Session Activity Tolerance: Patient tolerated treatment well General Behavior During Session: [facility] for tasks performed Cognition: [facility] for tasks performed   [name], COTA/L  07/23/2011, 2:34 PM

## 2011-07-25 ENCOUNTER — Ambulatory Visit (HOSPITAL_COMMUNITY)
Admission: RE | Admit: 2011-07-25 | Discharge: 2011-07-25 | Disposition: A | Discharge status: Home / Self Care | Payer: Medicare Other | Source: Ambulatory Visit | Attending: Family Medicine | Admitting: Family Medicine

## 2011-07-25 DIAGNOSIS — M25519 Pain in unspecified shoulder: Secondary | ICD-10-CM

## 2011-07-25 DIAGNOSIS — M6281 Muscle weakness (generalized): Secondary | ICD-10-CM

## 2011-07-25 NOTE — Progress Notes (Signed)
Occupational Therapy Treatment  Patient Details  Name: [name] MRN: [id number] Date of Birth: [birth year]  Today's Date: 07/25/2011 Time: 4098-1191 Time Calculation (min): 43 min Manual Therapy [phone] 14' Therapeutic Exercises [phone] 29' Visit#: 5  of 8   Re-eval: 08/06/11    Subjective Symptoms/Limitations Symptoms: S:  My shoulder is feeling ok. Pain Assessment Currently in Pain?: No/denies Pain Score: 0-No pain O:  Exercise/Treatments Supine Protraction: PROM;10 reps;Strengthening;12 reps Protraction Weight (lbs): 1# Horizontal ABduction: PROM;10 reps;Strengthening;12 reps Horizontal ABduction Weight (lbs): 1# External Rotation: PROM;10 reps;Strengthening;12 reps External Rotation Weight (lbs): 1# Internal Rotation: PROM;10 reps;Strengthening;12 reps Internal Rotation Weight (lbs): 1# Flexion: PROM;10 reps;Strengthening;12 reps Shoulder Flexion Weight (lbs): 1# ABduction: PROM;10 reps;Strengthening;12 reps Shoulder ABduction Weight (lbs): 1# Seated Protraction: Strengthening;10 reps Protraction Weight (lbs): 1 Horizontal ABduction: Strengthening;10 reps Horizontal ABduction Weight (lbs): 1 External Rotation: Strengthening;10 reps External Rotation Weight (lbs): 1 Internal Rotation: Strengthening;10 reps Internal Rotation Weight (lbs): 1 Flexion: Strengthening;10 reps Flexion Weight (lbs): 1 Abduction: Strengthening;10 reps ABduction Weight (lbs): 1 Therapy Ball Flexion:  (dc) ABduction:  (dc) Right/Left: 5 reps ROM / Strengthening / Isometric Strengthening UBE (Upper Arm Bike): 3' and 3' 1.0 Cybex Press:  (begin next visit) Cybex Row:  (begin next visit) Wall Wash: 4' "W" Arms: 10 with 1# X to V Arms: 10 with 1#   Manual Therapy Manual Therapy: Myofascial release Myofascial Release: MFR and manual stretching to left shoulder, focusing on supraspinatus, trapezius, and rhomboid region to decrease pain and restrictions.  [phone]  Occupational  Therapy Assessment and Plan OT Assessment and Plan Clinical Impression Statement: A:  Added UBE and 1# to seated. OT Plan: P:  Resume tband, add cybex.   Goals Short Term Goals Time to Complete Short Term Goals: 2 weeks Short Term Goal 1: Patient will be educated on a HEP. Short Term Goal 1 Progress: Progressing toward goal Short Term Goal 2: Patient will increase AROM by 10 in his left shoulder for increased ability to reach into overhead shelf in closet. Short Term Goal 2 Progress: Progressing toward goal Short Term Goal 3: Patient will decrease pain to 2/10 when lying down at night. Short Term Goal 3 Progress: Progressing toward goal Short Term Goal 4: Patient will decrease fascial restrictions from mod to min in his left shoulder region. Short Term Goal 4 Progress: Progressing toward goal Long Term Goals Time to Complete Long Term Goals: 4 weeks Long Term Goal 1: Patient will return to prior level of independence with all B/IADLs, work, and leisure activities. Long Term Goal 1 Progress: Progressing toward goal Long Term Goal 2: Patient will decrease pain to 1/10 in his left shoulder. 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 Long Term Goal 3 Progress: Progressing toward goal Long Term Goal 4: Patient will increase left shoulder strength to 5/5 for increased ability to lift gardening equipment. Long Term Goal 4 Progress: Progressing toward goal Long Term Goal 5: Patient will decrease fascial restrictions to trace in his left shoulder. Long Term Goal 5 Progress: Progressing toward goal Additional Long Term Goals?: Yes End of Session Patient Active Problem List  Diagnoses  . Bursitis of shoulder, left  . Pain in joint, shoulder region  . Muscle weakness (generalized)   End of Session Activity Tolerance: Patient tolerated treatment well General Behavior  During Session: [facility] for tasks performed Cognition: [facility] for tasks performed  Shirlean Mylar, OTR/L  07/25/2011, 8:38 AM

## 2011-07-30 ENCOUNTER — Ambulatory Visit (HOSPITAL_COMMUNITY)
Admission: RE | Admit: 2011-07-30 | Discharge: 2011-07-30 | Disposition: A | Discharge status: Home / Self Care | Payer: Medicare Other | Source: Ambulatory Visit | Attending: Family Medicine | Admitting: Family Medicine

## 2011-07-30 DIAGNOSIS — M25519 Pain in unspecified shoulder: Secondary | ICD-10-CM

## 2011-07-30 DIAGNOSIS — M6281 Muscle weakness (generalized): Secondary | ICD-10-CM

## 2011-07-30 NOTE — Progress Notes (Signed)
Occupational Therapy Treatment  Patient Details  Name: [name] MRN: [id number] Date of Birth: [birth year]  Today's Date: 07/30/2011 Time: 7829-5621 Time Calculation (min): 53 min Visit#: 6  of 8   Re-eval: 08/06/11 Manual Therapy [phone] 22 Therapeutic Exercise [phone]     Subjective Symptoms/Limitations Symptoms: S:  It is too wet to do anything, I feel good. Pain Assessment Currently in Pain?: No/denies  Exercise/Treatments Supine Protraction: PROM;10 reps;Strengthening;15 reps Protraction Weight (lbs): 1# Horizontal ABduction: PROM;10 reps;Strengthening;15 reps Horizontal ABduction Weight (lbs): 1# External Rotation: PROM;10 reps;Strengthening;15 reps External Rotation Weight (lbs): 1# Internal Rotation: PROM;10 reps;Strengthening;15 reps Internal Rotation Weight (lbs): 1# Flexion: PROM;10 reps;Strengthening;15 reps Shoulder Flexion Weight (lbs): 1# ABduction: PROM;10 reps;Strengthening;15 reps Shoulder ABduction Weight (lbs): 1# Seated Extension: Theraband;10 reps Theraband Level (Shoulder Extension): Level 3 (Green) Retraction: Theraband;10 reps Theraband Level (Shoulder Retraction): Level 3 (Green) Row: Theraband;10 reps Theraband Level (Shoulder Row): Level 3 (Green) Protraction: Strengthening;15 reps Protraction Weight (lbs): 1 Horizontal ABduction: Strengthening;15 reps Horizontal ABduction Weight (lbs): 1 External Rotation: Strengthening;15 reps;Theraband;12 reps Theraband Level (Shoulder External Rotation): Level 3 (Green) External Rotation Weight (lbs): 1 Internal Rotation: Strengthening;15 reps;Theraband;12 reps Theraband Level (Shoulder Internal Rotation): Level 3 (Green) Internal Rotation Weight (lbs): 1 Flexion: Strengthening;15 reps Flexion Weight (lbs): 1 Abduction: Strengthening;15 reps ABduction Weight (lbs): 1 Therapy Ball Right/Left: 5 reps ROM / Strengthening / Isometric Strengthening UBE (Upper Arm Bike): 3' and 3' 1.5 Wall  Wash: 3' with 1# "W" Arms: 10 with 1# X to V Arms: 10 with 1#    Manual Therapy Manual Therapy: Myofascial release Myofascial Release: MFR and manual stretching to left shoulder, focusing on supraspinatus, trapezius, and rhomboid region.  Occupational Therapy Assessment and Plan OT Assessment and Plan Clinical Impression Statement: A:  Added 1# to wall wash. Rehab Potential: Excellent OT Plan: P:  Add cybex.   Goals Short Term Goals Time to Complete Short Term Goals: 2 weeks Short Term Goal 1: Patient will be educated on a HEP. Short Term Goal 2: Patient will increase AROM by 10 in his left shoulder for increased ability to reach into overhead shelf in closet. Short Term Goal 3: Patient will decrease pain to 2/10 when lying down at night. Short Term Goal 4: Patient will decrease fascial restrictions from mod to min in his left shoulder region. Long Term Goals Time to Complete Long Term Goals: 4 weeks Long Term Goal 1: Patient will return to prior level of independence with all B/IADLs, work, and leisure activities. Long Term Goal 2: Patient will decrease pain to 1/10 in his left shoulder. 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 Long Term Goal 4: Patient will increase left shoulder strength to 5/5 for increased ability to lift gardening equipment. Long Term Goal 5: Patient will decrease fascial restrictions to trace in his left shoulder. Additional Long Term Goals?: Yes End of Session Patient Active Problem List  Diagnoses  . Bursitis of shoulder, left  . Pain in joint, shoulder region  . Muscle weakness (generalized)   End of Session Activity Tolerance: Patient tolerated treatment well General Behavior During Session: [facility] for tasks performed Cognition: [facility] for tasks performed   Cassius Cullinane L. Maleke Feria, COTA/L  07/30/2011, 4:55 PM

## 2011-08-06 ENCOUNTER — Ambulatory Visit (HOSPITAL_COMMUNITY)
Admission: RE | Admit: 2011-08-06 | Discharge: 2011-08-06 | Disposition: A | Discharge status: Home / Self Care | Payer: Medicare Other | Source: Ambulatory Visit | Attending: Orthopedic Surgery | Admitting: Orthopedic Surgery

## 2011-08-06 DIAGNOSIS — M25619 Stiffness of unspecified shoulder, not elsewhere classified: Secondary | ICD-10-CM | POA: Insufficient documentation

## 2011-08-06 DIAGNOSIS — M6281 Muscle weakness (generalized): Secondary | ICD-10-CM | POA: Insufficient documentation

## 2011-08-06 DIAGNOSIS — IMO0001 Reserved for inherently not codable concepts without codable children: Secondary | ICD-10-CM | POA: Insufficient documentation

## 2011-08-06 DIAGNOSIS — M25519 Pain in unspecified shoulder: Secondary | ICD-10-CM

## 2011-08-06 NOTE — Progress Notes (Signed)
Occupational Therapy Treatment  Patient Details  Name: [name] MRN: [id number] Date of Birth: 01/14/1930  Today's Date: 08/06/2011 Time: 7829-5621 Time Calculation (min): 49 min Manual Therapy [id number] 13' Therapeutic Exercises [phone] 60' Visit#: 7  of 8   Re-eval: 08/07/11    Subjective Symptoms/Limitations Symptoms: S:  It feels much better, I have just a little bit of pain in the front of my shoulder. Pain Assessment Currently in Pain?: Yes Pain Location: Shoulder Pain Orientation: Right Pain Type: Acute pain  Exercise/Treatments Supine Protraction: PROM;Strengthening;10 reps Protraction Weight (lbs): 2 Horizontal ABduction: PROM;Strengthening;10 reps Horizontal ABduction Weight (lbs): 2 External Rotation: PROM;Strengthening;10 reps External Rotation Weight (lbs): 2 Internal Rotation: PROM;Strengthening;10 reps Internal Rotation Weight (lbs): 2 Flexion: PROM;Strengthening;10 reps Shoulder Flexion Weight (lbs): 2 ABduction: PROM;Strengthening;10 reps Shoulder ABduction Weight (lbs): 2 Seated Extension: Theraband;15 reps (greem) Retraction: Theraband;15 reps (green) Row: Theraband;15 reps (green) Protraction: Strengthening;10 reps Protraction Weight (lbs): 2 Horizontal ABduction: Strengthening;10 reps Horizontal ABduction Weight (lbs): 2 External Rotation: Strengthening;10 reps;Theraband;15 reps (green) External Rotation Weight (lbs): 2 Internal Rotation: Strengthening;10 reps;Theraband;15 reps (green) Internal Rotation Weight (lbs): 2 Flexion: Strengthening;10 reps Flexion Weight (lbs): 2 Abduction: Strengthening;10 reps ABduction Weight (lbs): 2 Therapy Ball Right/Left:  (dc) ROM / Strengthening / Isometric Strengthening Cybex Press: 1.5 plate;15 reps Cybex Row: 1.5 plate;15 reps Wall Wash: 4' with 1# "W" Arms: 10 wtih 2# X to V Arms: 10 wtih 2#       Manual Therapy Manual Therapy: Myofascial release Myofascial Release: MFR and manual  stretching to left shoulder focusing on supraspinatus, trapezius, and rhomboid region.  [phone]  Occupational Therapy Assessment and Plan OT Assessment and Plan Clinical Impression Statement: A:  Increased to 2# with supine and seated strengthening exercises. OT Plan: P:  Reassess.   Goals Short Term Goals Time to Complete Short Term Goals: 2 weeks Short Term Goal 1: Patient will be educated on a HEP. Short Term Goal 2: Patient will increase AROM by 10 in his left shoulder for increased ability to reach into overhead shelf in closet. Short Term Goal 3: Patient will decrease pain to 2/10 when lying down at night. Short Term Goal 4: Patient will decrease fascial restrictions from mod to min in his left shoulder region. Long Term Goals Time to Complete Long Term Goals: 4 weeks Long Term Goal 1: Patient will return to prior level of independence with all B/IADLs, work, and leisure activities. Long Term Goal 2: Patient will decrease pain to 1/10 in his left shoulder. 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 Long Term Goal 4: Patient will increase left shoulder strength to 5/5 for increased ability to lift gardening equipment. Long Term Goal 5: Patient will decrease fascial restrictions to trace in his left shoulder. Additional Long Term Goals?: Yes  Problem List Patient Active Problem List  Diagnoses  . Bursitis of shoulder, left  . Pain in joint, shoulder region  . Muscle weakness (generalized)    End of Session Activity Tolerance: Patient tolerated treatment well General Behavior During Session: [facility] for tasks performed Cognition: [facility] for tasks performed   Shirlean Mylar, OTR/L  08/06/2011, 4:01 PM

## 2011-08-07 ENCOUNTER — Ambulatory Visit (HOSPITAL_COMMUNITY)
Admission: RE | Admit: 2011-08-07 | Discharge: 2011-08-07 | Disposition: A | Discharge status: Home / Self Care | Payer: Medicare Other | Source: Ambulatory Visit | Attending: Family Medicine | Admitting: Family Medicine

## 2011-08-07 DIAGNOSIS — M25519 Pain in unspecified shoulder: Secondary | ICD-10-CM

## 2011-08-07 DIAGNOSIS — M6281 Muscle weakness (generalized): Secondary | ICD-10-CM

## 2011-08-07 NOTE — Progress Notes (Signed)
Occupational Therapy Treatment  Patient Details  Name: Ichiro Chesnut MRN: [id number] Date of Birth: January 29, 1930  Today's Date: 08/07/2011 Time: 4098-1191 Time Calculation (min): 27 min Manual Therapy [phone] 15' Therapeutic Exercises [phone] 8' Reassessment [phone] 4' Visit#: 8  of 8   Re-eval: 08/07/11    Subjective Symptoms/Limitations Symptoms: S:  It feels alright so far.  O:  Exercise/Treatments Supine Protraction: PROM;10 reps;Strengthening;12 reps Protraction Weight (lbs): 2 Horizontal ABduction: PROM;10 reps;Strengthening;12 reps Horizontal ABduction Weight (lbs): 2 External Rotation: PROM;10 reps;Strengthening;12 reps External Rotation Weight (lbs): 2 Internal Rotation: PROM;10 reps;Strengthening;12 reps Internal Rotation Weight (lbs): 2 Flexion: PROM;10 reps;Strengthening;12 reps Shoulder Flexion Weight (lbs): 2 ABduction: PROM;10 reps;Strengthening;12 reps Shoulder ABduction Weight (lbs): 2    Manual Therapy Manual Therapy: Myofascial release Myofascial Release: MFR and manual stretching to left shoulder focusing on supraspinatus, trapezius, and rhomboid region. [phone]  Occupational Therapy Assessment and Plan OT Assessment and Plan Clinical Impression Statement: A:  Please see dc summary. OT Plan: P:  DC secondary to all goals met.   Goals Short Term Goals Time to Complete Short Term Goals: 2 weeks Short Term Goal 1: Patient will be educated on a HEP. Short Term Goal 1 Progress: Met Short Term Goal 2: Patient will increase AROM by 10 in his left shoulder for increased ability to reach into overhead shelf in closet. Short Term Goal 2 Progress: Met Short Term Goal 3: Patient will decrease pain to 2/10 when lying down at night. Short Term Goal 3 Progress: Met Short Term Goal 4: Patient will decrease fascial restrictions from mod to min in his left shoulder region. Short Term Goal 4 Progress: Met Long Term Goals Time to Complete Long Term Goals: 4  weeks Long Term Goal 1: Patient will return to prior level of independence with all B/IADLs, work, and leisure activities. Long Term Goal 1 Progress: Met Long Term Goal 2: Patient will decrease pain to 1/10 in his left shoulder. Long Term Goal 2 Progress: Met Long Term Goal 3: Patient will increase AROM to WNL in his left shoulder in order to stock top shelves at work. Long Term Goal 3 Progress: Met Long Term Goal 4: Patient will increase left shoulder strength to 5/5 for increased ability to lift gardening equipment. Long Term Goal 4 Progress: Met Long Term Goal 5: Patient will decrease fascial restrictions to trace in his left shoulder. Long Term Goal 5 Progress: Met Additional Long Term Goals?: Yes  Problem List Patient Active Problem List  Diagnoses  . Bursitis of shoulder, left  . Pain in joint, shoulder region  . Muscle weakness (generalized)    End of Session Activity Tolerance: Patient tolerated treatment well General Behavior During Session: [facility] for tasks performed Cognition: [facility] for tasks performed   Shirlean Mylar, OTR/L  08/07/2011, 10:12 AM

## 2012-07-06 ENCOUNTER — Ambulatory Visit (INDEPENDENT_AMBULATORY_CARE_PROVIDER_SITE_OTHER): Payer: Medicare Other | Admitting: Orthopedic Surgery

## 2012-07-06 ENCOUNTER — Encounter: Payer: Self-pay | Admitting: Orthopedic Surgery

## 2012-07-06 VITALS — Ht 70.0 in | Wt 187.0 lb

## 2012-07-06 DIAGNOSIS — M7552 Bursitis of left shoulder: Secondary | ICD-10-CM

## 2012-07-06 DIAGNOSIS — M67919 Unspecified disorder of synovium and tendon, unspecified shoulder: Secondary | ICD-10-CM

## 2012-07-06 NOTE — Progress Notes (Signed)
Patient ID: [name], male   DOB: 01-13-1930, 78 y.o.   MRN: [id number] Chief Complaint  Patient presents with  . Follow-up    Recheck on left shoulder.    No diagnosis found.  He did well with an injection back in November of last year would like another injection. He still has 150 of active forward elevation. We have approved him to get the following injection  Subacromial Shoulder Injection Procedure Note  Pre-operative Diagnosis: left RC Syndrome  Post-operative Diagnosis: same  Indications: pain   Anesthesia: ethyl chloride   Procedure Details   Verbal consent was obtained for the procedure. The shoulder was prepped withalcohol and the skin was anesthetized. A 20 gauge needle was advanced into the subacromial space through posterior approach without difficulty  The space was then injected with 3 ml 1% lidocaine and 1 ml of depomedrol. The injection site was cleansed with isopropyl alcohol and a dressing was applied.  Complications:  None; patient tolerated the procedure well.

## 2012-07-06 NOTE — Patient Instructions (Addendum)
You have received a steroid shot. 15% of patients experience increased pain at the injection site with in the next 24 hours. This is best treated with ice and tylenol extra strength 2 tabs every 8 hours. If you are still having pain please call the office.    Exercises at home

## 2012-08-16 ENCOUNTER — Other Ambulatory Visit: Payer: Self-pay | Admitting: *Deleted

## 2012-08-16 ENCOUNTER — Telehealth: Payer: Self-pay | Admitting: Orthopedic Surgery

## 2012-08-16 DIAGNOSIS — R52 Pain, unspecified: Secondary | ICD-10-CM

## 2012-08-16 MED ORDER — HYDROCODONE-ACETAMINOPHEN 5-325 MG PO TABS: 1.0000 | ORAL_TABLET | Freq: Four times a day (QID) | ORAL | Status: DC | PRN

## 2012-08-16 NOTE — Telephone Encounter (Signed)
Called and relayed doctor's reply.  [name] will bring his new insurance card by the office today.

## 2012-08-16 NOTE — Telephone Encounter (Signed)
Mr. Colby Reels received an injection in left shoulder 07/06/12 but it has not helped his pain very much.  He takes Hydrocodone and it helps some. Asking if he needs to come back in or what do you advise?   His # [phone]

## 2012-08-16 NOTE — Telephone Encounter (Signed)
CALL HIM  TELL HIM HE WILL NEED MRI (OR ARTHROGRAM IF ANY CONTRAINDICATIONS)  AND AFTER THAT HE CAN BE SEEN AGAIN   (THEN ORDER MRI)

## 2012-08-19 ENCOUNTER — Other Ambulatory Visit: Payer: Self-pay | Admitting: Radiology

## 2012-08-19 DIAGNOSIS — M75100 Unspecified rotator cuff tear or rupture of unspecified shoulder, not specified as traumatic: Secondary | ICD-10-CM

## 2012-08-24 ENCOUNTER — Telehealth: Payer: Self-pay | Admitting: Radiology

## 2012-08-24 NOTE — Telephone Encounter (Signed)
Patient has MRI appointment at [facility] on 08-27-12 at 11:30. Patient has [name], authorization # [id number] and it expires on 10-08-12. Patient will follow up back here in our office for results.

## 2012-08-27 ENCOUNTER — Ambulatory Visit (HOSPITAL_COMMUNITY)
Admission: RE | Admit: 2012-08-27 | Discharge: 2012-08-27 | Disposition: A | Discharge status: Home / Self Care | Payer: Medicare Other | Source: Ambulatory Visit | Attending: Orthopedic Surgery | Admitting: Orthopedic Surgery

## 2012-08-27 DIAGNOSIS — M249 Joint derangement, unspecified: Secondary | ICD-10-CM | POA: Insufficient documentation

## 2012-08-27 DIAGNOSIS — M75100 Unspecified rotator cuff tear or rupture of unspecified shoulder, not specified as traumatic: Secondary | ICD-10-CM

## 2012-08-27 DIAGNOSIS — R937 Abnormal findings on diagnostic imaging of other parts of musculoskeletal system: Secondary | ICD-10-CM | POA: Insufficient documentation

## 2012-08-27 DIAGNOSIS — M25519 Pain in unspecified shoulder: Secondary | ICD-10-CM | POA: Insufficient documentation

## 2012-09-01 ENCOUNTER — Encounter: Payer: Self-pay | Admitting: Orthopedic Surgery

## 2012-09-01 ENCOUNTER — Ambulatory Visit (INDEPENDENT_AMBULATORY_CARE_PROVIDER_SITE_OTHER): Payer: Medicare Other | Admitting: Orthopedic Surgery

## 2012-09-01 VITALS — BP 118/68 | Ht 70.0 in | Wt 187.0 lb

## 2012-09-01 DIAGNOSIS — S43429A Sprain of unspecified rotator cuff capsule, initial encounter: Secondary | ICD-10-CM

## 2012-09-01 DIAGNOSIS — M7502 Adhesive capsulitis of left shoulder: Secondary | ICD-10-CM

## 2012-09-01 DIAGNOSIS — M75110 Incomplete rotator cuff tear or rupture of unspecified shoulder, not specified as traumatic: Secondary | ICD-10-CM | POA: Insufficient documentation

## 2012-09-01 DIAGNOSIS — R52 Pain, unspecified: Secondary | ICD-10-CM

## 2012-09-01 DIAGNOSIS — M7500 Adhesive capsulitis of unspecified shoulder: Secondary | ICD-10-CM

## 2012-09-01 MED ORDER — HYDROCODONE-ACETAMINOPHEN 5-325 MG PO TABS: 1.0000 | ORAL_TABLET | Freq: Four times a day (QID) | ORAL | Status: DC | PRN

## 2012-09-01 NOTE — Progress Notes (Signed)
Patient ID: [name], male   DOB: Dec 29, 1929, 79 y.o.   MRN: [id number] Chief Complaint  Patient presents with  . Follow-up    MRI results, left shoulder pain     The patient reports for MRI followup left shoulder continued pain after physical therapy and to injections  MRI was done on January 24  The impression is that he has a small full-thickness not retracted supraspinatus tendon tear no atrophy with changes consistent with adhesive capsulitis he has some mild superior labral degeneration and bicipital tendinosis  He is 79 years old he still has good forward elevation good strength is pain and some limitations in his range of motion  I advised him to continue with therapy and pain medication and if that is unsatisfactory to him to come back and see [place] for reevaluation and possible arthroscopic lysis of adhesions and manipulation and cuff repair  He was not too excited about surgery so he is agreed to continue exercises at home and take Norco for pain on as-needed basis  Review of systems he denies numbness tingling in his upper extremity  His shoulder is nontender to palpation has restrictions in internal rotation external rotation and forward elevation compared to his opposite right shoulder which has 150 of flexion 45 external rotation. It is stable. Left shoulder stable. Strength appears to be normal with internal and external rotation as well as the supraspinatus. Neurovascular exam is intact bilaterally  Impression  1. Pain  HYDROcodone-acetaminophen (NORCO/VICODIN) 5-325 MG per tablet  2. Adhesive bursitis of left shoulder    3. Partial tear of rotator cuff       Plan continue therapy return in 2 months reassess

## 2012-09-01 NOTE — Patient Instructions (Signed)
Take norco for pain and do exercises at home

## 2012-09-06 ENCOUNTER — Telehealth: Payer: Self-pay | Admitting: Orthopedic Surgery

## 2012-09-06 NOTE — Telephone Encounter (Signed)
Patient, wife, called to ask for a call back, as has questions about what was discussed about shoulder at last office visit, 09/01/12. States patient needs to know about how long out of work, if he does consider surgery, and whether it would be done as out-patient.   Ph [phone].

## 2012-09-07 NOTE — Telephone Encounter (Signed)
Called patient's wife back and answered questions regarding surgery.

## 2012-09-29 ENCOUNTER — Ambulatory Visit (INDEPENDENT_AMBULATORY_CARE_PROVIDER_SITE_OTHER): Payer: Medicare Other | Admitting: Orthopedic Surgery

## 2012-09-29 ENCOUNTER — Encounter: Payer: Self-pay | Admitting: Orthopedic Surgery

## 2012-09-29 VITALS — BP 120/58 | Ht 70.0 in | Wt 187.0 lb

## 2012-09-29 DIAGNOSIS — M75110 Incomplete rotator cuff tear or rupture of unspecified shoulder, not specified as traumatic: Secondary | ICD-10-CM

## 2012-09-29 DIAGNOSIS — M7502 Adhesive capsulitis of left shoulder: Secondary | ICD-10-CM

## 2012-09-29 DIAGNOSIS — S43429A Sprain of unspecified rotator cuff capsule, initial encounter: Secondary | ICD-10-CM

## 2012-09-29 DIAGNOSIS — M7500 Adhesive capsulitis of unspecified shoulder: Secondary | ICD-10-CM

## 2012-09-29 NOTE — Progress Notes (Signed)
Patient ID: [name], male   DOB: 11/15/1929, 79 y.o.   MRN: [id number] Chief Complaint  Patient presents with  . Follow-up    Recheck oleft shoulder, schedule surgery.    This patient has been evaluated several times for pain and stiffness in his left shoulder is also undergone physical therapy and cortisone injections as well as oral medications he continues to complain of pain in the left shoulder with a deep dull ache moderate in severity constant worse at night, affect his ability to sleep. He sat for several months. No major trauma was noted. He is concerned about his motion and his inability to perform normal activities  Review of systems x10 no findings at this time  Past family social history as follows  Past Medical History  Diagnosis Date  . Diabetes mellitus     Past Surgical History  Procedure Laterality Date  . Tonsillectomy    . Knee arthroscopy      Family History  Problem Relation Age of Onset  . Arthritis    . Cancer    . Diabetes      Comprehensive exam BP 120/58  Ht 5\' 10"  (1.778 m)  Wt 187 lb (84.823 kg)  BMI 26.83 kg/m2 General appearance is normal frame is small he is oriented x3 with normal mood and affect he ambulates reasonably normal gait  Lower extremity exam  Ambulation is normal.  The right and left lower extremity:  Inspection and palpation revealed no tenderness or abnormality in alignment in the lower extremities. Range of motion is full.  Strength is grade 5.  All joints are stable.   His right upper extremity shows full range of motion no tenderness. Stability tests are normal strength is normal grade 5. Skin is intact.  His left upper extremity shows pain around the shoulder joint and the deltoid. No a.c. joint tenderness. Positive impingement sign pain at 120 of flexion passive range of motion limited to 130 of flexion. Stability tests normal. Strength tests show reasonable strength grade 5 minus over 5 supraspinatus  normal external and internal rotators. Skin intact.  Pulses are normal he has no palpable lymph nodes sensations intact deep tendon reflexes are normal and his coordination and balance are normal  The MRI as recorded in the epigastric partial tear with findings consistent with adhesive capsulitis  Partial tear of rotator cuff  Adhesive bursitis of left shoulder   Plan is for arthroscopic rotator cuff repair left shoulder, with the option to open.  I don't think he'll need manipulation. I think we can lyse any adhesions during the arthroscopic portion of the procedure.  The procedure risks and benefits postoperative plan is been explained to the patient his wife was present. They wish to proceed with surgery despite these risks and they understand him.

## 2012-09-29 NOTE — Patient Instructions (Signed)
Surgery March 14th  Shoulder arthroscopy and open rotator cuff repair left shoulder   Surgery for Rotator Cuff Tear with Rehab Rotator cuff surgery is only recommended for individuals who have experienced persistent disability for greater than 3 months of non-surgical (conservative) treatment. Surgery is not necessary but is recommended for individuals who experience difficulty completing daily activities or athletes who are unable to compete. Rotator cuff tears do not usually heal without surgical intervention. If left alone small rotator cuff tears usually become larger. Younger athletes who have a rotator cuff tear may be recommended for surgery without attempting conservative rehabilitation. The purpose of surgery is to regain function of the shoulder joint and eliminate pain associated with the injury. In addition to repairing the tendon tear, the surgery often removes a portion of the bony roof of the shoulder (acromion) as well as the chronically thickened and inflamed membrane below the acromion (subacromial bursa). REASONS NOT TO OPERATE   Infection of the shoulder.  Inability to complete a rehabilitation program.  Patients who have other conditions (emotional or psychological) conditions that contribute to their shoulder condition. RISKS AND COMPLICATIONS  Infection.  Re-tear of the rotator cuff tendons or muscles.  Shoulder stiffness and/or weakness.  Inability to compete in athletics.  Acromioclavicular (AC) joint paint.  Risks of surgery: infection, bleeding, nerve damage, or damage to surrounding tissues. TECHNIQUE There are different surgical procedures used to treat rotator cuff tears. The type of procedure depends on the extent of injury as well as the surgeon's preference. All of the surgical techniques for rotator cuff tears have the same goal of repairing the torn tendon, removing part of the acromion, and removing the subacromial bursa. There are two main types of  procedures: arthroscopic and open incision. Arthroscopic procedures are usually completed and you go home the same day as surgery (outpatient). These procedures use multiple small incisions in which tools and a video camera are placed to work on the shoulder. An electric shaver removes the bursa, then a power burr shaves down the portion of the acromion that places pressure on the rotator cuff. Finally the rotator cuff is sewed (sutured) back to the humeral head. Open incision procedures require a larger incision. The deltoid muscle is detached from the acromion and a ligament in the shoulder (coracoacromial) is cut in order for the surgeon to access the rotator cuff. The subacromial bursa is removed as well as part of the acromion to give the rotator cuff room to move freely. The torn tendon is then sutured to the humeral head. After the rotator cuff is repaired, then the deltoid is reattached and the incision is closed up.  RECOVERY   Post-operative care depends on the surgical technique and the preferences of your therapist.  Keep the wound clean and dry for the first 10 to 14 days after surgery.  Keep your shoulder and arm in the sling provided to you for as long as you have been instructed to.  You will be given pain medications by your caregiver.  Passive (without using muscles) shoulder movements may be begun immediately after surgery.  It is important to follow through with you rehabilitation program in order to have the best possible recovery. RETURN TO SPORTS   The rehabilitation period will depend on the sport and position you play as well as the success of the operation.  The minimum recovery period is 6 months.  You must have regained complete shoulder motion and strength before returning to sports. SEEK IMMEDIATE MEDICAL  CARE IF:   Any medications produce adverse side effects.  Any complications from surgery occur:  Pain, numbness, or coldness in the extremity operated  upon.  Discoloration of the nail beds (they become blue or gray) of the extremity operated upon.  Signs of infections (fever, pain, inflammation, redness, or persistent bleeding).

## 2012-10-01 ENCOUNTER — Encounter (HOSPITAL_COMMUNITY): Payer: Self-pay | Admitting: Pharmacy Technician

## 2012-10-07 ENCOUNTER — Telehealth: Payer: Self-pay | Admitting: Orthopedic Surgery

## 2012-10-07 NOTE — H&P (Signed)
[name]  MRN:  [id number]   Description: 79 year old male  Provider: Vickki Hearing, MD  Department: [facility]        Diagnoses    Partial tear of rotator cuff    -  Primary    840.4    Adhesive bursitis of left shoulder        726.0      Reason for Visit    Follow-up    Recheck oleft shoulder, schedule surgery.        Current Vitals - Last Recorded    BP Ht Wt BMI        120/58 5\' 10"  (1.778 m) 187 lb (84.823 kg) 26.83 kg/m2           Vitals History Recorded       Progress Notes    Vickki Hearing, MD at 09/29/2012 11:04 AM    Status: Signed                   Patient ID: [name], male   DOB: 26-Aug-1929, 79 y.o.   MRN: [id number] Chief Complaint   Patient presents with   .  Follow-up       Recheck oleft shoulder, schedule surgery.        This patient has been evaluated several times for pain and stiffness in his left shoulder is also undergone physical therapy and cortisone injections as well as oral medications he continues to complain of pain in the left shoulder with a deep dull ache moderate in severity constant worse at night, affect his ability to sleep. He sat for several months. No major trauma was noted. He is concerned about his motion and his inability to perform normal activities   Review of systems x10 no findings at this time   Past family social history as follows    Past Medical History   Diagnosis  Date   .  Diabetes mellitus           Past Surgical History   Procedure  Laterality  Date   .  Tonsillectomy       .  Knee arthroscopy             Family History   Problem  Relation  Age of Onset   .  Arthritis       .  Cancer       .  Diabetes            Comprehensive exam BP 120/58  Ht 5\' 10"  (1.778 m)  Wt 187 lb (84.823 kg)  BMI 26.83 kg/m2 General appearance is normal frame is small he is oriented x3 with normal mood and affect he ambulates reasonably normal gait   Lower extremity  exam   Ambulation is normal.   The right and left lower extremity:   Inspection and palpation revealed no tenderness or abnormality in alignment in the lower extremities. Range of motion is full.  Strength is grade 5.   All joints are stable.     His right upper extremity shows full range of motion no tenderness. Stability tests are normal strength is normal grade 5. Skin is intact.   His left upper extremity shows pain around the shoulder joint and the deltoid. No a.c. joint tenderness. Positive impingement sign pain at 120 of flexion passive range of motion limited to 130 of flexion. Stability tests normal. Strength tests show reasonable strength grade 5 minus over 5 supraspinatus normal external  and internal rotators. Skin intact.   Pulses are normal he has no palpable lymph nodes sensations intact deep tendon reflexes are normal and his coordination and balance are normal   The MRI as recorded in the epigastric partial tear with findings consistent with adhesive capsulitis   Partial tear of rotator cuff   Adhesive bursitis of left shoulder     Plan is for arthroscopic rotator cuff repair left shoulder, with the option to open.   I don't think he'll need manipulation. I think we can lyse any adhesions during the arthroscopic portion of the procedure.   The procedure risks and benefits postoperative plan is been explained to the patient his wife was present. They wish to proceed with surgery despite these risks and they understand him.

## 2012-10-07 NOTE — Telephone Encounter (Signed)
Contacted [employer] at [phone], per representative Joaquin Courts. No precert required for outpatient surgery for CPT [phone] and [phone]

## 2012-10-08 NOTE — Patient Instructions (Addendum)
[name]  10/08/2012   Your procedure is scheduled on:  10/15/2012  Report to [facility] at  615  AM.  Call this number if you have problems the morning of surgery: [phone]   Remember:   Do not eat food or drink liquids after midnight.   Take these medicines the morning of surgery with A SIP OF WATER: norco,lisinopril,zantac   Do not wear jewelry, make-up or nail polish.  Do not wear lotions, powders, or perfumes.   Do not shave 48 hours prior to surgery. Men may shave face and neck.  Do not bring valuables to the hospital.  Contacts, dentures or bridgework may not be worn into surgery.  Leave suitcase in the car. After surgery it may be brought to your room.  For patients admitted to the hospital, checkout time is 11:00 AM the day of discharge.   Patients discharged the day of surgery will not be allowed to drive  home.  Name and phone number of your driver: family  Special Instructions: Shower using CHG 2 nights before surgery and the night before surgery.  If you shower the day of surgery use CHG.  Use special wash - you have one bottle of CHG for all showers.  You should use approximately 1/3 of the bottle for each shower.   Please read over the following fact sheets that you were given: Pain Booklet, Coughing and Deep Breathing, MRSA Information, Surgical Site Infection Prevention, Anesthesia Post-op Instructions and Care and Recovery After Surgery Surgery for Rotator Cuff Tear with Rehab Rotator cuff surgery is only recommended for individuals who have experienced persistent disability for greater than 3 months of non-surgical (conservative) treatment. Surgery is not necessary but is recommended for individuals who experience difficulty completing daily activities or athletes who are unable to compete. Rotator cuff tears do not usually heal without surgical intervention. If left alone small rotator cuff tears usually become larger. Younger athletes who have a rotator  cuff tear may be recommended for surgery without attempting conservative rehabilitation. The purpose of surgery is to regain function of the shoulder joint and eliminate pain associated with the injury. In addition to repairing the tendon tear, the surgery often removes a portion of the bony roof of the shoulder (acromion) as well as the chronically thickened and inflamed membrane below the acromion (subacromial bursa). REASONS NOT TO OPERATE   Infection of the shoulder.  Inability to complete a rehabilitation program.  Patients who have other conditions (emotional or psychological) conditions that contribute to their shoulder condition. RISKS AND COMPLICATIONS  Infection.  Re-tear of the rotator cuff tendons or muscles.  Shoulder stiffness and/or weakness.  Inability to compete in athletics.  Acromioclavicular (AC) joint paint.  Risks of surgery: infection, bleeding, nerve damage, or damage to surrounding tissues. TECHNIQUE There are different surgical procedures used to treat rotator cuff tears. The type of procedure depends on the extent of injury as well as the surgeon's preference. All of the surgical techniques for rotator cuff tears have the same goal of repairing the torn tendon, removing part of the acromion, and removing the subacromial bursa. There are two main types of procedures: arthroscopic and open incision. Arthroscopic procedures are usually completed and you go home the same day as surgery (outpatient). These procedures use multiple small incisions in which tools and a video camera are placed to work on the shoulder. An electric shaver removes the bursa, then a power burr shaves down the portion  of the acromion that places pressure on the rotator cuff. Finally the rotator cuff is sewed (sutured) back to the humeral head. Open incision procedures require a larger incision. The deltoid muscle is detached from the acromion and a ligament in the shoulder (coracoacromial) is cut  in order for the surgeon to access the rotator cuff. The subacromial bursa is removed as well as part of the acromion to give the rotator cuff room to move freely. The torn tendon is then sutured to the humeral head. After the rotator cuff is repaired, then the deltoid is reattached and the incision is closed up.  RECOVERY   Post-operative care depends on the surgical technique and the preferences of your therapist.  Keep the wound clean and dry for the first 10 to 14 days after surgery.  Keep your shoulder and arm in the sling provided to you for as long as you have been instructed to.  You will be given pain medications by your caregiver.  Passive (without using muscles) shoulder movements may be begun immediately after surgery.  It is important to follow through with you rehabilitation program in order to have the best possible recovery. RETURN TO SPORTS   The rehabilitation period will depend on the sport and position you play as well as the success of the operation.  The minimum recovery period is 6 months.  You must have regained complete shoulder motion and strength before returning to sports. SEEK IMMEDIATE MEDICAL CARE IF:   Any medications produce adverse side effects.  Any complications from surgery occur:  Pain, numbness, or coldness in the extremity operated upon.  Discoloration of the nail beds (they become blue or gray) of the extremity operated upon.  Signs of infections (fever, pain, inflammation, redness, or persistent bleeding). EXERCISES  RANGE OF MOTION (ROM) AND STRETCHING EXERCISES - Rotator Cuff Tear, Surgery For These exercises may help you restore your elbow mobility once your physician has discontinued your immobilization period. Beginning these before your provider's approval may result in delayed healing. Your symptoms may resolve with or without further involvement from your physician, physical therapist or athletic trainer. While completing these  exercises, remember:   Restoring tissue flexibility helps normal motion to return to the joints. This allows healthier, less painful movement and activity.  An effective stretch should be held for at least 30 seconds. A stretch should never be painful. You should only feel a gentle lengthening or release in the stretch. ROM - Pendulum   Bend at the waist so that your right / left arm falls away from your body. Support yourself with your opposite hand on a solid surface, such as a table or a countertop.  Your right / left arm should be perpendicular to the ground. If it is not perpendicular, you need to lean over farther. Relax the muscles in your right / left arm and shoulder as much as possible.  Gently sway your hips and trunk so they move your right / left arm without any use of your right / left shoulder muscles.  Progress your movements so that your right / left arm moves side to side, then forward and backward, and finally, both clockwise and counterclockwise.  Complete __________ repetitions in each direction. Many people use this exercise to relieve discomfort in their shoulder as well as to gain range of motion. Repeat __________ times. Complete this exercise __________ times per day. STRETCH  Flexion, Seated   Sit in a firm chair so that your right / left  forearm can rest on a table or on a table or countertop. Your right / left elbow should rest below the height of your shoulder so that your shoulder feels supported and not tense or uncomfortable.  Keeping your right / left shoulder relaxed, lean forward at your waist, allowing your right / left hand to slide forward. Bend forward until you feel a moderate stretch in your shoulder, but before you feel an increase in your pain.  Hold __________ seconds. Slowly return to your starting position. Repeat __________ times. Complete this exercise __________ times per day.  STRETCH  Flexion, Standing   Stand with good posture. With an  underhand grip on your right / left and an overhand grip on the opposite hand, grasp a broomstick or cane so that your hands are a little more than shoulder-width apart.  Keeping your right / left elbow straight and shoulder muscles relaxed, push the stick with your opposite hand to raise your right / left arm in front of your body and then overhead. Raise your arm until you feel a stretch in your right / left shoulder, but before you have increased shoulder pain.  Try to avoid shrugging your right / left shoulder as your arm rises by keeping your shoulder blade tucked down and toward your mid-back spine. Hold __________ seconds.  Slowly return to the starting position. Repeat __________ times. Complete this exercise __________ times per day.  STRETCH  Abduction, Supine   Stand with good posture. With an underhand grip on your right / left and an overhand grip on the opposite hand, grasp a broomstick or cane so that your hands are a little more than shoulder-width apart.  Keeping your right / left elbow straight and shoulder muscles relaxed, push the stick with your opposite hand to raise your right / left arm out to the side of your body and then overhead. Raise your arm until you feel a stretch in your right / left shoulder, but before you have increased shoulder pain.  Try to avoid shrugging your right / left shoulder as your arm rises by keeping your shoulder blade tucked down and toward your mid-back spine. Hold __________ seconds.  Slowly return to the starting position. Repeat __________ times. Complete this exercise __________ times per day.  ROM  Flexion, Active-Assisted  Lie on your back. You may bend your knees for comfort.  Grasp a broomstick or cane so your hands are about shoulder-width apart. Your right / left hand should grip the end of the stick/cane so that your hand is positioned "thumbs-up," as if you were about to shake hands.  Using your healthy arm to lead, raise your  right / left arm overhead until you feel a gentle stretch in your shoulder. Hold __________ seconds.  Use the stick/cane to assist in returning your right / left arm to its starting position. Repeat __________ times. Complete this exercise __________ times per day.  STRETCH  External Rotation   Tuck a folded towel or small ball under your right / left upper arm. Grasp a broomstick or cane with an underhand grasp a little more than shoulder width apart. Bend your elbows to 90 degrees.  Stand with good posture or sit in a chair without arms.  Use your strong arm to push the stick across your body. Do not allow the towel or ball to fall. This will rotate your right / left arm away from your abdomen. Using the stick turn/rotate your hand and forearm away from  your body. Hold __________ seconds. Repeat __________ times. Complete this exercise __________ times per day.  STRENGTHENING EXERCISES - Rotator Cuff Tear, Surgery For These exercises may help you begin to restore your elbow strength in the initial stage of your rehabilitation. Your physician will determine when you begin these exercises depending on the severity of your injury and the integrity of your repaired tissues. Beginning these before your provider's approval may result in delayed healing. While completing these exercises, remember:   Muscles can gain both the endurance and the strength needed for everyday activities through controlled exercises.  Complete these exercises as instructed by your physician, physical therapist or athletic trainer. Progress the resistance and repetitions only as guided.  You may experience muscle soreness or fatigue, but the pain or discomfort you are trying to eliminate should never worsen during these exercises. If this pain does worsen, stop and make certain you are following the directions exactly. If the pain is still present after adjustments, discontinue the exercise until you can discuss the trouble  with your clinician. STRENGTH - Shoulder Flexion, Isometric   With good posture and facing a wall, stand or sit about 4-6 inches away.  Keeping your right / left elbow straight, gently press the top of your fist into the wall. Increase the pressure gradually until you are pressing as hard as you can without shrugging your shoulder or increasing any shoulder discomfort.  Hold __________ seconds. Release the tension slowly. Relax your shoulder muscles completely before you do the next repetition. Repeat __________ times. Complete this exercise __________ times per day.  STRENGTH - Shoulder Abductors, Isometric   With good posture, stand or sit about 4-6 inches from a wall with your right / left side facing the wall.  Bend your right / left elbow. Gently press your right / left elbow into the wall. Increase the pressure gradually until you are pressing as hard as you can without shrugging your shoulder or increasing any shoulder discomfort.  Hold __________ seconds.  Release the tension slowly. Relax your shoulder muscles completely before you do the next repetition. Repeat __________ times. Complete this exercise __________ times per day.  STRENGTH - Internal Rotators, Isometric   Keep your right / left elbow at your side and bend it 90 degrees.  Step into a door frame so that the inside of your right / left wrist can press against the door frame without your upper arm leaving your side.  Gently press your right / left wrist into the door frame as if you were trying to draw the palm of your hand to your abdomen. Gradually increase the tension until you are pressing as hard as you can without shrugging your shoulder or increasing any shoulder discomfort.  Hold __________ seconds.  Release the tension slowly. Relax your shoulder muscles completely before you do the next repetition. Repeat __________ times. Complete this exercise __________ times per day.  STRENGTH - External Rotators,  Isometric   Keep your right / left elbow at your side and bend it 90 degrees.  Step into a door frame so that the outside of your right / left wrist can press against the door frame without your upper arm leaving your side.  Gently press your right / left wrist into the door frame as if you were trying to swing the back of your hand away from your abdomen. Gradually increase the tension until you are pressing as hard as you can without shrugging your shoulder or increasing any shoulder  discomfort.  Hold __________ seconds.  Release the tension slowly. Relax your shoulder muscles completely before you do the next repetition. Repeat __________ times. Complete this exercise __________ times per day.  Document Released: 07/21/2005 Document Revised: 10/13/2011 Document Reviewed: 11/02/2008 [facility] Patient Information 2013 [place], [place]. Shoulder Arthroscopy Because the shoulder is one of the most mobile joints, it is more prone to injury. It is a very shallow ball and socket joint located between the large bone in your upper arm (humerus) and the shoulder blade (scapula). Arthroscopy is a valuable test for evaluating and treating injuries involving the shoulder joint. Arthroscopy is a surgical technique which uses small incisions (cuts by the surgeon) to insert a small telescope like instrument (arthroscope) and other tools into the shoulder. This allows the surgeon to look directly at the problem. When the arthroscope is in the joint, fluid is used to expand the joint space. This allows the surgeon to examine it more easily. The arthroscope then beams light into the joint and sends an image to a TV screen. As your surgeon examines your shoulder, he or she can also repair a number of problems found at the same time. Sometimes the procedure may change to an open surgery. This would happen if the problems are severe enough that they cannot be corrected with just arthroscopy. This is usually a very safe  surgery. Rare complications include damage to nerves or blood vessels, excess bleeding, blood clots, infection, and rarely instrument failure. This is most often performed as a same day surgery. This means you will not have to stay in the hospital overnight. Recovery from this surgery is also much faster than having an open procedure. LET YOUR CAREGIVER KNOW ABOUT:  Allergies.  Medications taken including herbs, eye drops, over the counter medications, and creams.  Use of steroids (by mouth or creams).  Previous problems with anesthetics or novocaine.  Possibility of pregnancy, if this applies.  History of blood clots (thrombophlebitis).  History of bleeding or blood problems.  Previous surgery.  Other health problems.  Family history of anesthetic problems. BEFORE THE PROCEDURE   Stop all anti-inflammatory medications at least one week before surgery unless instructed otherwise. Tell your surgeon if you have been taking cortisone or other steroids.  Do not eat or drink after midnight or as instructed. Take medications as directed by your caregiver. You may have lab tests the morning of surgery.  You should be present 60 minutes prior to your procedure or as directed. PROCEDURE  You may have general (go to sleep) or local (numb the area) anesthetic. Your surgeon will discuss this with you. During the procedure as discussed above, your surgeon may find a variety of problems which he or she can improve or correct using small instruments. When the procedure is finished the tiny incisions will be closed with stitches or tape. AFTER YOUR PROCEDURE  After surgery you will be taken to the recovery area. A nurse will watch and check your progress. Once you are awake, stable, and taking fluids well, barring other problems you will be allowed to go home.  Once home, apply an ice pack to your operative site for twenty minutes, three to four times per day, for two to three days. This may help  with discomfort and keep the swelling down.  Use a sling and medications if prescribed or as instructed.  Unless your caregiver advises otherwise, move your arm and shoulder gently and frequently following the procedure. This can help prevent stiffness and  swelling. REHABILITATION  Almost as important as your surgery is your rehabilitation. If physical therapy and exercises are prescribed by your surgeon, follow them diligently. Once comfortable and on your way to full use, do muscle strengthening exercises as instructed.  Only take over-the-counter or prescription medicines for pain, discomfort, or fever as directed by your caregiver. SEEK IMMEDIATE MEDICAL CARE IF:   There is redness, swelling, or increasing pain in the wound or joint.  You notice purulent (colored- pus-like) drainage coming from the wound.  An unexplained oral temperature above 102 F (38.9 C) develops.  You notice a foul smell coming from the wound or dressing.  There is a breaking open of the wound. The edges do not stay together after sutures or tape has been removed.  Persistent bleeding from the small incision. Document Released: 07/18/2000 Document Revised: 10/13/2011 Document Reviewed: 11/06/2008 [facility] Patient Information 2013 [place], [place]. PATIENT INSTRUCTIONS POST-ANESTHESIA  IMMEDIATELY FOLLOWING SURGERY:  Do not drive or operate machinery for the first twenty four hours after surgery.  Do not make any important decisions for twenty four hours after surgery or while taking narcotic pain medications or sedatives.  If you develop intractable nausea and vomiting or a severe headache please notify your doctor immediately.  FOLLOW-UP:  Please make an appointment with your surgeon as instructed. You do not need to follow up with anesthesia unless specifically instructed to do so.  WOUND CARE INSTRUCTIONS (if applicable):  Keep a dry clean dressing on the anesthesia/puncture wound site if there is  drainage.  Once the wound has quit draining you may leave it open to air.  Generally you should leave the bandage intact for twenty four hours unless there is drainage.  If the epidural site drains for more than 36-48 hours please call the anesthesia department.  QUESTIONS?:  Please feel free to call your physician or the hospital operator if you have any questions, and they will be happy to assist you.

## 2012-10-11 ENCOUNTER — Other Ambulatory Visit: Payer: Self-pay

## 2012-10-11 ENCOUNTER — Encounter (HOSPITAL_COMMUNITY): Payer: Self-pay

## 2012-10-11 ENCOUNTER — Encounter (HOSPITAL_COMMUNITY)
Admission: RE | Admit: 2012-10-11 | Discharge: 2012-10-11 | Disposition: A | Discharge status: Home / Self Care | Payer: Medicare Other | Source: Ambulatory Visit | Attending: Orthopedic Surgery | Admitting: Orthopedic Surgery

## 2012-10-11 HISTORY — DX: Unspecified osteoarthritis, unspecified site: M19.90

## 2012-10-11 HISTORY — DX: Gastro-esophageal reflux disease without esophagitis: K21.9

## 2012-10-11 HISTORY — DX: Essential (primary) hypertension: I10

## 2012-10-11 LAB — BASIC METABOLIC PANEL
BUN: 15 mg/dL (ref 6–23)
Calcium: 9.9 mg/dL (ref 8.4–10.5)
Chloride: 100 mEq/L (ref 96–112)
Creatinine, Ser: 0.98 mg/dL (ref 0.50–1.35)
GFR calc Af Amer: 88 mL/min — ABNORMAL LOW (ref >90)
GFR calc non Af Amer: 76 mL/min — ABNORMAL LOW (ref >90)

## 2012-10-11 LAB — SURGICAL PCR SCREEN: MRSA, PCR: NEGATIVE

## 2012-10-11 LAB — HEMOGLOBIN AND HEMATOCRIT, BLOOD
HCT: 38.6 % — ABNORMAL LOW (ref 39.0–52.0)
Hemoglobin: 13 g/dL (ref 13.0–17.0)

## 2012-10-15 ENCOUNTER — Encounter (HOSPITAL_COMMUNITY)
Admission: RE | Disposition: A | Discharge status: Home / Self Care | Payer: Self-pay | Source: Ambulatory Visit | Attending: Orthopedic Surgery

## 2012-10-15 ENCOUNTER — Encounter (HOSPITAL_COMMUNITY): Payer: Self-pay | Admitting: Anesthesiology

## 2012-10-15 ENCOUNTER — Other Ambulatory Visit: Payer: Self-pay | Admitting: Orthopedic Surgery

## 2012-10-15 ENCOUNTER — Ambulatory Visit (HOSPITAL_COMMUNITY)
Admission: RE | Admit: 2012-10-15 | Discharge: 2012-10-15 | Disposition: A | Discharge status: Home / Self Care | Payer: Medicare Other | Source: Ambulatory Visit | Attending: Orthopedic Surgery | Admitting: Orthopedic Surgery

## 2012-10-15 ENCOUNTER — Ambulatory Visit (HOSPITAL_COMMUNITY): Payer: Medicare Other | Admitting: Anesthesiology

## 2012-10-15 ENCOUNTER — Encounter (HOSPITAL_COMMUNITY): Payer: Self-pay | Admitting: *Deleted

## 2012-10-15 DIAGNOSIS — M7502 Adhesive capsulitis of left shoulder: Secondary | ICD-10-CM

## 2012-10-15 DIAGNOSIS — S43429A Sprain of unspecified rotator cuff capsule, initial encounter: Secondary | ICD-10-CM

## 2012-10-15 DIAGNOSIS — M19019 Primary osteoarthritis, unspecified shoulder: Secondary | ICD-10-CM | POA: Diagnosis present

## 2012-10-15 DIAGNOSIS — M75110 Incomplete rotator cuff tear or rupture of unspecified shoulder, not specified as traumatic: Secondary | ICD-10-CM

## 2012-10-15 DIAGNOSIS — M67919 Unspecified disorder of synovium and tendon, unspecified shoulder: Secondary | ICD-10-CM | POA: Insufficient documentation

## 2012-10-15 DIAGNOSIS — M7520 Bicipital tendinitis, unspecified shoulder: Secondary | ICD-10-CM

## 2012-10-15 DIAGNOSIS — I10 Essential (primary) hypertension: Secondary | ICD-10-CM | POA: Insufficient documentation

## 2012-10-15 DIAGNOSIS — Z01812 Encounter for preprocedural laboratory examination: Secondary | ICD-10-CM | POA: Insufficient documentation

## 2012-10-15 DIAGNOSIS — M75102 Unspecified rotator cuff tear or rupture of left shoulder, not specified as traumatic: Secondary | ICD-10-CM

## 2012-10-15 DIAGNOSIS — M7522 Bicipital tendinitis, left shoulder: Secondary | ICD-10-CM | POA: Diagnosis present

## 2012-10-15 DIAGNOSIS — Z01810 Encounter for preprocedural cardiovascular examination: Secondary | ICD-10-CM | POA: Insufficient documentation

## 2012-10-15 DIAGNOSIS — M25819 Other specified joint disorders, unspecified shoulder: Secondary | ICD-10-CM | POA: Insufficient documentation

## 2012-10-15 DIAGNOSIS — M7552 Bursitis of left shoulder: Secondary | ICD-10-CM

## 2012-10-15 DIAGNOSIS — M25512 Pain in left shoulder: Secondary | ICD-10-CM

## 2012-10-15 DIAGNOSIS — E119 Type 2 diabetes mellitus without complications: Secondary | ICD-10-CM | POA: Insufficient documentation

## 2012-10-15 DIAGNOSIS — M719 Bursopathy, unspecified: Secondary | ICD-10-CM | POA: Insufficient documentation

## 2012-10-15 HISTORY — PX: SHOULDER ARTHROSCOPY WITH OPEN ROTATOR CUFF REPAIR: SHX6092

## 2012-10-15 HISTORY — PX: SHOULDER ACROMIOPLASTY: SHX6093

## 2012-10-15 HISTORY — PX: RESECTION DISTAL CLAVICAL: SHX5053

## 2012-10-15 LAB — GLUCOSE, CAPILLARY: Glucose-Capillary: 213 mg/dL — ABNORMAL HIGH (ref 70–99)

## 2012-10-15 SURGERY — ARTHROSCOPY, SHOULDER WITH REPAIR, ROTATOR CUFF, OPEN
Anesthesia: General | Site: Shoulder | Laterality: Left | Wound class: Clean

## 2012-10-15 MED ORDER — PREGABALIN 50 MG PO CAPS
ORAL_CAPSULE | ORAL | Status: AC
  Filled 2012-10-15: qty 1

## 2012-10-15 MED ORDER — ROCURONIUM BROMIDE 50 MG/5ML IV SOLN
INTRAVENOUS | Status: AC
  Filled 2012-10-15: qty 1

## 2012-10-15 MED ORDER — CELECOXIB 100 MG PO CAPS
400.0000 mg | ORAL_CAPSULE | Freq: Once | ORAL | Status: AC
  Administered 2012-10-15: 400 mg via ORAL

## 2012-10-15 MED ORDER — ONDANSETRON HCL 4 MG/2ML IJ SOLN
4.0000 mg | Freq: Once | INTRAMUSCULAR | Status: AC
  Administered 2012-10-15: 4 mg via INTRAVENOUS

## 2012-10-15 MED ORDER — BUPIVACAINE-EPINEPHRINE PF 0.5-1:200000 % IJ SOLN
INTRAMUSCULAR | Status: AC
  Filled 2012-10-15: qty 20

## 2012-10-15 MED ORDER — PROPOFOL 10 MG/ML IV BOLUS
INTRAVENOUS | Status: DC | PRN
  Administered 2012-10-15: 110 mg via INTRAVENOUS

## 2012-10-15 MED ORDER — PROPOFOL 10 MG/ML IV EMUL
INTRAVENOUS | Status: AC
  Filled 2012-10-15: qty 20

## 2012-10-15 MED ORDER — CELECOXIB 100 MG PO CAPS
ORAL_CAPSULE | ORAL | Status: AC
  Filled 2012-10-15: qty 4

## 2012-10-15 MED ORDER — SODIUM CHLORIDE 0.9 % IR SOLN
Status: DC | PRN
  Administered 2012-10-15 (×7)

## 2012-10-15 MED ORDER — ONDANSETRON HCL 4 MG/2ML IJ SOLN: 4.0000 mg | Freq: Once | INTRAMUSCULAR | Status: DC | PRN

## 2012-10-15 MED ORDER — CEFAZOLIN SODIUM-DEXTROSE 2-3 GM-% IV SOLR: 2.0000 g | INTRAVENOUS | Status: DC

## 2012-10-15 MED ORDER — METHOCARBAMOL 500 MG PO TABS: 500.0000 mg | ORAL_TABLET | Freq: Four times a day (QID) | ORAL | Status: DC

## 2012-10-15 MED ORDER — PREGABALIN 50 MG PO CAPS
50.0000 mg | ORAL_CAPSULE | Freq: Once | ORAL | Status: AC
  Administered 2012-10-15: 50 mg via ORAL

## 2012-10-15 MED ORDER — MIDAZOLAM HCL 2 MG/2ML IJ SOLN
INTRAMUSCULAR | Status: AC
  Filled 2012-10-15: qty 2

## 2012-10-15 MED ORDER — GLYCOPYRROLATE 0.2 MG/ML IJ SOLN
INTRAMUSCULAR | Status: DC | PRN
  Administered 2012-10-15: 0.4 mg via INTRAVENOUS

## 2012-10-15 MED ORDER — EPHEDRINE SULFATE 50 MG/ML IJ SOLN
INTRAMUSCULAR | Status: DC | PRN
  Administered 2012-10-15: 10 mg via INTRAVENOUS
  Administered 2012-10-15 (×2): 5 mg via INTRAVENOUS
  Administered 2012-10-15: 10 mg via INTRAVENOUS

## 2012-10-15 MED ORDER — HYDROCODONE-ACETAMINOPHEN 10-325 MG PO TABS: 1.0000 | ORAL_TABLET | ORAL | Status: DC | PRN

## 2012-10-15 MED ORDER — SODIUM CHLORIDE 0.9 % IR SOLN
Status: DC | PRN
  Administered 2012-10-15: 1000 mL

## 2012-10-15 MED ORDER — CHLORHEXIDINE GLUCONATE 4 % EX LIQD: 60.0000 mL | Freq: Once | CUTANEOUS | Status: DC

## 2012-10-15 MED ORDER — EPINEPHRINE HCL 1 MG/ML IJ SOLN
INTRAMUSCULAR | Status: AC
  Filled 2012-10-15: qty 5

## 2012-10-15 MED ORDER — HYDROCODONE-ACETAMINOPHEN 7.5-325 MG PO TABS
1.0000 | ORAL_TABLET | Freq: Once | ORAL | Status: AC
  Administered 2012-10-15: 1 via ORAL

## 2012-10-15 MED ORDER — IBUPROFEN 800 MG PO TABS: 800.0000 mg | ORAL_TABLET | Freq: Three times a day (TID) | ORAL | Status: DC | PRN

## 2012-10-15 MED ORDER — KETOROLAC TROMETHAMINE 30 MG/ML IJ SOLN
INTRAMUSCULAR | Status: AC
  Filled 2012-10-15: qty 1

## 2012-10-15 MED ORDER — LACTATED RINGERS IV SOLN
INTRAVENOUS | Status: DC
  Administered 2012-10-15 (×2): via INTRAVENOUS

## 2012-10-15 MED ORDER — MIDAZOLAM HCL 5 MG/5ML IJ SOLN
INTRAMUSCULAR | Status: DC | PRN
  Administered 2012-10-15: 2 mg via INTRAVENOUS

## 2012-10-15 MED ORDER — EPHEDRINE SULFATE 50 MG/ML IJ SOLN
INTRAMUSCULAR | Status: AC
  Filled 2012-10-15: qty 1

## 2012-10-15 MED ORDER — LIDOCAINE HCL 1 % IJ SOLN
INTRAMUSCULAR | Status: DC | PRN
  Administered 2012-10-15: 50 mg via INTRADERMAL

## 2012-10-15 MED ORDER — HYDROCODONE-ACETAMINOPHEN 7.5-325 MG PO TABS
ORAL_TABLET | ORAL | Status: AC
  Filled 2012-10-15: qty 1

## 2012-10-15 MED ORDER — FENTANYL CITRATE 0.05 MG/ML IJ SOLN: 25.0000 ug | INTRAMUSCULAR | Status: DC | PRN

## 2012-10-15 MED ORDER — FENTANYL CITRATE 0.05 MG/ML IJ SOLN
INTRAMUSCULAR | Status: AC
  Filled 2012-10-15: qty 5

## 2012-10-15 MED ORDER — PROMETHAZINE HCL 12.5 MG PO TABS: 12.5000 mg | ORAL_TABLET | Freq: Four times a day (QID) | ORAL | Status: DC | PRN

## 2012-10-15 MED ORDER — EPINEPHRINE HCL 1 MG/ML IJ SOLN
INTRAMUSCULAR | Status: AC
  Filled 2012-10-15: qty 3

## 2012-10-15 MED ORDER — KETOROLAC TROMETHAMINE 30 MG/ML IJ SOLN
30.0000 mg | Freq: Once | INTRAMUSCULAR | Status: AC
  Administered 2012-10-15: 30 mg via INTRAVENOUS

## 2012-10-15 MED ORDER — ACETAMINOPHEN 10 MG/ML IV SOLN
INTRAVENOUS | Status: AC
  Filled 2012-10-15: qty 100

## 2012-10-15 MED ORDER — SUCCINYLCHOLINE CHLORIDE 20 MG/ML IJ SOLN
INTRAMUSCULAR | Status: AC
  Filled 2012-10-15: qty 1

## 2012-10-15 MED ORDER — ONDANSETRON HCL 4 MG/2ML IJ SOLN
4.0000 mg | Freq: Once | INTRAMUSCULAR | Status: AC
Start: 2012-10-15 — End: 2012-10-15
  Administered 2012-10-15: 4 mg via INTRAVENOUS

## 2012-10-15 MED ORDER — ACETAMINOPHEN 10 MG/ML IV SOLN
500.0000 mg | Freq: Once | INTRAVENOUS | Status: AC
  Administered 2012-10-15: 500 mg via INTRAVENOUS

## 2012-10-15 MED ORDER — MIDAZOLAM HCL 2 MG/2ML IJ SOLN
1.0000 mg | INTRAMUSCULAR | Status: DC | PRN
  Administered 2012-10-15: 2 mg via INTRAVENOUS

## 2012-10-15 MED ORDER — FENTANYL CITRATE 0.05 MG/ML IJ SOLN
INTRAMUSCULAR | Status: DC | PRN
  Administered 2012-10-15 (×3): 50 ug via INTRAVENOUS
  Administered 2012-10-15: 100 ug via INTRAVENOUS
  Administered 2012-10-15 (×2): 50 ug via INTRAVENOUS

## 2012-10-15 MED ORDER — ONDANSETRON HCL 4 MG/2ML IJ SOLN
INTRAMUSCULAR | Status: AC
  Filled 2012-10-15: qty 2

## 2012-10-15 MED ORDER — ROCURONIUM BROMIDE 100 MG/10ML IV SOLN
INTRAVENOUS | Status: DC | PRN
  Administered 2012-10-15: 40 mg via INTRAVENOUS
  Administered 2012-10-15: 20 mg via INTRAVENOUS

## 2012-10-15 MED ORDER — BUPIVACAINE-EPINEPHRINE 0.5% -1:200000 IJ SOLN
INTRAMUSCULAR | Status: DC | PRN
  Administered 2012-10-15: 60 mL

## 2012-10-15 MED ORDER — LIDOCAINE HCL (PF) 1 % IJ SOLN
INTRAMUSCULAR | Status: AC
  Filled 2012-10-15: qty 5

## 2012-10-15 MED ORDER — CEFAZOLIN SODIUM-DEXTROSE 2-3 GM-% IV SOLR
INTRAVENOUS | Status: DC | PRN
  Administered 2012-10-15: 2 g via INTRAVENOUS

## 2012-10-15 MED ORDER — NEOSTIGMINE METHYLSULFATE 1 MG/ML IJ SOLN
INTRAMUSCULAR | Status: DC | PRN
  Administered 2012-10-15: 3 mg via INTRAVENOUS

## 2012-10-15 MED ORDER — FENTANYL CITRATE 0.05 MG/ML IJ SOLN
INTRAMUSCULAR | Status: AC
  Filled 2012-10-15: qty 2

## 2012-10-15 MED ORDER — CEFAZOLIN SODIUM-DEXTROSE 2-3 GM-% IV SOLR
INTRAVENOUS | Status: AC
  Filled 2012-10-15: qty 50

## 2012-10-15 SURGICAL SUPPLY — 101 items
ANCH SUT PUSHLCK 24X4.5 STRL (Orthopedic Implant) ×1 IMPLANT
ANCHOR TITAN 6.5 (Anchor) ×1 IMPLANT
BAG HAMPER (MISCELLANEOUS) ×2 IMPLANT
BIT DRILL 2.0X128 (BIT) ×1 IMPLANT
BLADE AGGRESSIVE PLUS 4.0 (BLADE) ×2 IMPLANT
BLADE AVERAGE 25X9 (BLADE) IMPLANT
BLADE HEX COATED 2.75 (ELECTRODE) ×2 IMPLANT
BLADE SURG 15 STRL LF DISP TIS (BLADE) ×1 IMPLANT
BLADE SURG 15 STRL SS (BLADE) ×2
BLADE SURG SZ10 CARB STEEL (BLADE) ×3 IMPLANT
BLADE SURG SZ11 CARB STEEL (BLADE) ×2 IMPLANT
BNDG COHESIVE 4X5 TAN STRL (GAUZE/BANDAGES/DRESSINGS) ×2 IMPLANT
BUR 5.0 BARRELL (BURR) ×1 IMPLANT
BUR BARRELL 4.0 (BURR) IMPLANT
BUR ROUND 5.0 (BURR) IMPLANT
CANNULA DRILOCK 5.0X75 (CANNULA) IMPLANT
CANNULA DRILOCK 6.5X75 (CANNULA) ×1 IMPLANT
CANNULA DRILOCK 8.0X75 (CANNULA) IMPLANT
CATH KIT ON Q 2.5IN SLV (PAIN MANAGEMENT) IMPLANT
CHLORAPREP W/TINT 26ML (MISCELLANEOUS) ×2 IMPLANT
CLOTH BEACON ORANGE TIMEOUT ST (SAFETY) ×2 IMPLANT
COOLER CRYO CUFF IC AND MOTOR (MISCELLANEOUS) ×1 IMPLANT
COVER LIGHT HANDLE STERIS (MISCELLANEOUS) ×4 IMPLANT
COVER PROBE W GEL 5X96 (DRAPES) ×2 IMPLANT
CUFF CRYO UNI SHDR 32X48 (MISCELLANEOUS) ×1 IMPLANT
DECANTER SPIKE VIAL GLASS SM (MISCELLANEOUS) ×3 IMPLANT
DRAPE PROXIMA HALF (DRAPES) ×2 IMPLANT
DRAPE SHOULDER BEACH CHAIR (DRAPES) ×2 IMPLANT
DRAPE U-SHAPE 47X51 STRL (DRAPES) ×1 IMPLANT
DRESSING ALLEVYN BORDER HEEL (GAUZE/BANDAGES/DRESSINGS) ×3 IMPLANT
ELECT REM PT RETURN 9FT ADLT (ELECTROSURGICAL) ×2
ELECTRODE REM PT RTRN 9FT ADLT (ELECTROSURGICAL) ×1 IMPLANT
FACESHIELD STD STERILE (MASK) ×1 IMPLANT
FIBERSTICK 2 (SUTURE) IMPLANT
FLOOR PAD 36X40 (MISCELLANEOUS)
GAUZE SPONGE 4X4 16PLY XRAY LF (GAUZE/BANDAGES/DRESSINGS) ×2 IMPLANT
GAUZE XEROFORM 5X9 LF (GAUZE/BANDAGES/DRESSINGS) IMPLANT
GLOVE ECLIPSE 6.5 STRL STRAW (GLOVE) ×1 IMPLANT
GLOVE SKINSENSE NS SZ8.0 LF (GLOVE) ×1
GLOVE SKINSENSE STRL SZ8.0 LF (GLOVE) ×1 IMPLANT
GLOVE SS N UNI LF 8.5 STRL (GLOVE) ×2 IMPLANT
GOWN STRL REIN XL XLG (GOWN DISPOSABLE) ×7 IMPLANT
INST SET MINOR BONE (KITS) ×2 IMPLANT
IV NS IRRIG 3000ML ARTHROMATIC (IV SOLUTION) ×9 IMPLANT
KIT BLADEGUARD II DBL (SET/KITS/TRAYS/PACK) ×2 IMPLANT
KIT POSITION SHOULDER SCHLEI (MISCELLANEOUS) ×2 IMPLANT
KIT ROOM TURNOVER APOR (KITS) ×2 IMPLANT
MANIFOLD NEPTUNE II (INSTRUMENTS) ×2 IMPLANT
MARKER SKIN DUAL TIP RULER LAB (MISCELLANEOUS) ×2 IMPLANT
NDL HYPO 18GX1.5 BLUNT FILL (NEEDLE) IMPLANT
NDL HYPO 21X1.5 SAFETY (NEEDLE) ×1 IMPLANT
NDL MAYO 6 CRC TAPER PT (NEEDLE) ×1 IMPLANT
NDL SCORPION (NEEDLE) IMPLANT
NDL SPNL 18GX3.5 QUINCKE PK (NEEDLE) ×1 IMPLANT
NEEDLE HYPO 18GX1.5 BLUNT FILL (NEEDLE) ×4 IMPLANT
NEEDLE HYPO 21X1.5 SAFETY (NEEDLE) ×2 IMPLANT
NEEDLE MAYO 6 CRC TAPER PT (NEEDLE) ×2 IMPLANT
NEEDLE SCORPION (NEEDLE) IMPLANT
NEEDLE SPNL 18GX3.5 QUINCKE PK (NEEDLE) ×2 IMPLANT
NS IRRIG 1000ML POUR BTL (IV SOLUTION) ×2 IMPLANT
PACK BASIC III (CUSTOM PROCEDURE TRAY) ×2
PACK SRG BSC III STRL LF ECLPS (CUSTOM PROCEDURE TRAY) ×1 IMPLANT
PAD ABD 5X9 TENDERSORB (GAUZE/BANDAGES/DRESSINGS) IMPLANT
PAD ARMBOARD 7.5X6 YLW CONV (MISCELLANEOUS) ×3 IMPLANT
PAD FLOOR 36X40 (MISCELLANEOUS) ×1 IMPLANT
PASSER SUT CAPTURE FIRST (SUTURE) IMPLANT
PENCIL HANDSWITCHING (ELECTRODE) ×2 IMPLANT
PUSHLOCK PEEK 4.5X24 (Orthopedic Implant) ×1 IMPLANT
RASP SM TEAR CROSS CUT (RASP) IMPLANT
SET ARTHROSCOPY INST (INSTRUMENTS) ×2 IMPLANT
SET BASIN LINEN APH (SET/KITS/TRAYS/PACK) ×2 IMPLANT
SPONGE GAUZE 4X4 12PLY (GAUZE/BANDAGES/DRESSINGS) IMPLANT
SPONGE LAP 18X18 X RAY DECT (DISPOSABLE) ×1 IMPLANT
STAPLER VISISTAT 35W (STAPLE) ×1 IMPLANT
STOCKINETTE IMPERVIOUS LG (DRAPES) ×2 IMPLANT
STRIP CLOSURE SKIN 1/2X4 (GAUZE/BANDAGES/DRESSINGS) IMPLANT
SUT BONE WAX W31G (SUTURE) IMPLANT
SUT ETHIBOND NAB OS 4 #2 30IN (SUTURE) ×4 IMPLANT
SUT ETHILON 3 0 FSL (SUTURE) ×1 IMPLANT
SUT FIBERWIRE #2 38 REV NDL BL (SUTURE)
SUT FIBERWIRE #2 38 T-5 BLUE (SUTURE)
SUT LASSO 45 DEGREE (SUTURE) IMPLANT
SUT LASSO 45 DEGREE LEFT (SUTURE) IMPLANT
SUT LASSO 45D RIGHT (SUTURE) IMPLANT
SUT MON AB 2-0 CT1 36 (SUTURE) ×1 IMPLANT
SUT PROLENE 2 0 FS (SUTURE) IMPLANT
SUT PROLENE 2 0 SH 30 (SUTURE) IMPLANT
SUT PROLENE 3 0 PS 1 (SUTURE) ×1 IMPLANT
SUT VIC AB 1 CT1 27 (SUTURE) ×4
SUT VIC AB 1 CT1 27XBRD ANTBC (SUTURE) ×2 IMPLANT
SUTURE FIBERWR #2 38 T-5 BLUE (SUTURE) IMPLANT
SUTURE FIBERWR#2 38 REV NDL BL (SUTURE) IMPLANT
SYR 30ML LL (SYRINGE) ×2 IMPLANT
SYR 5ML LL (SYRINGE) ×1 IMPLANT
SYR BULB IRRIGATION 50ML (SYRINGE) ×1 IMPLANT
SYRINGE 10CC LL (SYRINGE) ×1 IMPLANT
TAPE MEDIFIX FOAM 3 (GAUZE/BANDAGES/DRESSINGS) IMPLANT
TOWEL OR 17X26 4PK STRL BLUE (TOWEL DISPOSABLE) ×2 IMPLANT
TUBING ARTHROSCOPY INFLOW/OUT (IRRIGATION / IRRIGATOR) ×2 IMPLANT
WAND 90 DEG TURBOVAC W/CORD (SURGICAL WAND) ×2 IMPLANT
YANKAUER SUCT BULB TIP 10FT TU (MISCELLANEOUS) ×5 IMPLANT

## 2012-10-15 NOTE — Op Note (Addendum)
10/15/2012  10:14 AM  PATIENT:  [name]  79 y.o. male  PRE-OPERATIVE DIAGNOSIS:  left rotator cuff tear biceps tendinosis, impingement syndrome, a.c. joint arthrosis  POST-OPERATIVE DIAGNOSIS:  left rotator cuff tear, biceps tendinosis, impingement syndrome, a.c. joint arthrosis  PROCEDURE:  Procedure(s) with comments: SHOULDER ARTHROSCOPY WITH OPEN ROTATOR CUFF REPAIR (Left) - open at 0912; with bicep tendon debridement; removal of distal clavicle SHOULDER ACROMIOPLASTY (Left)  Procedure shoulder arthroscopy with arthroscopic biceps tendon debridement limited, acromioplasty, arthroscopic removal of distal clavicle.  Open rotator cuff repair  Operative findings biceps tendinosis with intact biceps tendon, large impinging osteophyte from the acromioclavicular joint. Anterior acromial impingement. Supraspinatus tear full thickness and partial thickness no retraction measuring 2 cm. Tear was chronic bursal side  Because of the procedure  Primary indication of is pain despite injection medication therapy  The patient was identified in the preop area the surgical site was confirmed and marked. The chart is reviewed. The patient was taken to the operating room and given an appropriate dose of Ancef based on this week.  After intubation was placed in the modified beach chair position. Timeout was completed after sterile prep and drape.  The posterior portal for diagnostic arthroscopy was established and the scope was placed in the joint. A diagnostic arthroscopy revealed a normal humeral head a normal glenoid . The biceps tendon was partially torn and inflamed. This was debrided with a motorized shaver was stable tendon and the stent an anterior portal.  The rotator cuff was examined from underneath on the articular side was found to be intact with a small rent in the supraspinatus tendon.  The scope was then placed into the subacromial space where a large amount of bursal tissue was  noted and this was debrided.  The acromioplasty was performed due to an anterior spur. A large acromial clavicular spur was also noted on MRI and was found to be impinging on the rotator cuff and this was debrided and removed as well. Approximately 7 mm of bone was removed with a barrel tipped burr.  The scope was removed from the joint and an anterolateral incision was made over the anterior and middle third of the deltoid. The deltoid split was performed and the tear was noticeable once the subacromial bursa which was thick was removed. A lot of adhesions were noted in the subdeltoid space and these were released with finger dissection. An ArthroCare suture anchor was placed after debridement of the torn tendon at its attachment on the tuberosity and drilling of 3 bone stimulation tunnels. 2 sutures were then placed in the rotator cuff to repair it side to side and to bone. The sutures were placed in the remaining undersurface portion of the supraspinatus tendon and in the side to side tear. This gave an excellent approximation of the tendon edges. We also placed a second horizontal interrupted mattress suture in the rotator cuff tendon supraspinatus secondary to a small superficial tear that was noted. The suture limbs were then passed in a separate bone tunnel using a push LOCK anchor.  The cuff repair was then inspected and found to be watertight. There were no suture edges or lens noted in the subacromial space the subacromial decompression was inspected and palpated and was found to be smooth.  The wound was then irrigated. The deltoid split was repaired with #1 prolonged suture in a running fashion. Subcutaneous tissue was closed with 2-0 Monocryl. Its staples were then used to reapproximate the skin edges. The portal  sites were closed with 3-0 nylon suture and 30 cc of Marcaine with epinephrine was injected in the subacromial space  A sterile dressing and sling were applied. The patient was  extubated taken to recovery room in stable condition   SURGEON:  Surgeon(s) and Role:    * Vickki Hearing, MD - Primary  PHYSICIAN ASSISTANT:   ASSISTANTS: BETTY ASHLEY  ANESTHESIA:   general  EBL:  Total I/O In: 1000 [I.V.:1000] Out: -   BLOOD ADMINISTERED:none  DRAINS: none   LOCAL MEDICATIONS USED:  MARCAINE   , Amount: 60 ml and OTHER W EPI  SPECIMEN:  No Specimen  DISPOSITION OF SPECIMEN:  N/A  COUNTS:  YES  TOURNIQUET:  * No tourniquets in log *  DICTATION: .Dragon Dictation  PLAN OF CARE: To be determined after PACU evaluation  PATIENT DISPOSITION:  PACU - hemodynamically stable.   Delay start of Pharmacological VTE agent (>24hrs) due to surgical blood loss or risk of bleeding: not applicable

## 2012-10-15 NOTE — Interval H&P Note (Signed)
History and Physical Interval Note:  10/15/2012 7:20 AM  [name]  has presented today for surgery, with the diagnosis of left rotator cuff tear  The various methods of treatment have been discussed with the patient and family. After consideration of risks, benefits and other options for treatment, the patient has consented to  Procedure(s): SHOULDER ARTHROSCOPY WITH OPEN ROTATOR CUFF REPAIR (Left) as a surgical intervention .  The patient's history has been reviewed, patient examined, no change in status, stable for surgery.  I have reviewed the patient's chart and labs.  Questions were answered to the patient's satisfaction.     Fuller Canada

## 2012-10-15 NOTE — Anesthesia Postprocedure Evaluation (Signed)
  Anesthesia Post-op Note  Patient: [name]  Procedure(s) Performed: Procedure(s) with comments: SHOULDER ARTHROSCOPY WITH OPEN ROTATOR CUFF REPAIR (Left) - open at 0912; with bicep tendon debridement; removal of distal clavicle SHOULDER ACROMIOPLASTY (Left)  Patient Location: PACU  Anesthesia Type:General  Level of Consciousness: awake, alert , oriented and patient cooperative  Airway and Oxygen Therapy: Patient Spontanous Breathing  Post-op Pain: 5 /10, moderate  Post-op Assessment: Post-op Vital signs reviewed, Patient's Cardiovascular Status Stable, Respiratory Function Stable, Patent Airway, No signs of Nausea or vomiting and Pain level controlled  Post-op Vital Signs: Reviewed and stable  Complications: No apparent anesthesia complications

## 2012-10-15 NOTE — Anesthesia Procedure Notes (Signed)
Procedure Name: Intubation Date/Time: 10/15/2012 7:44 AM Performed by: Despina Hidden Pre-anesthesia Checklist: Emergency Drugs available, Suction available, Patient identified and Patient being monitored Patient Re-evaluated:Patient Re-evaluated prior to inductionOxygen Delivery Method: Circle system utilized Intubation Type: IV induction Ventilation: Mask ventilation without difficulty Laryngoscope Size: Mac and 3 Grade View: Grade I Tube type: Oral Tube size: 8.0 mm Number of attempts: 1 Airway Equipment and Method: Patient positioned with wedge pillow and Stylet Placement Confirmation: ETT inserted through vocal cords under direct vision,  positive ETCO2 and breath sounds checked- equal and bilateral Secured at: 22 cm Tube secured with: Tape Dental Injury: Teeth and Oropharynx as per pre-operative assessment

## 2012-10-15 NOTE — Transfer of Care (Signed)
Immediate Anesthesia Transfer of Care Note  Patient: [name]  Procedure(s) Performed: Procedure(s) with comments: SHOULDER ARTHROSCOPY WITH OPEN ROTATOR CUFF REPAIR (Left) - open at 0912; with bicep tendon debridement; removal of distal clavicle SHOULDER ACROMIOPLASTY (Left)  Patient Location: PACU  Anesthesia Type:General  Level of Consciousness: patient cooperative  Airway & Oxygen Therapy: Patient Spontanous Breathing and Patient connected to face mask oxygen  Post-op Assessment: Report given to PACU RN, Post -op Vital signs reviewed and stable and Patient moving all extremities  Post vital signs: Reviewed and stable  Complications: No apparent anesthesia complications

## 2012-10-15 NOTE — Anesthesia Preprocedure Evaluation (Signed)
Anesthesia Evaluation  Patient identified by MRN, date of birth, ID band Patient awake    Reviewed: Allergy & Precautions, H&P , NPO status , Patient's Chart, lab work & pertinent test results  Airway Mallampati: III TM Distance: >3 FB     Dental  (+) Edentulous Upper and Edentulous Lower   Pulmonary neg pulmonary ROS,  breath sounds clear to auscultation        Cardiovascular hypertension, Pt. on medications Rhythm:Regular Rate:Normal     Neuro/Psych    GI/Hepatic GERD-  Medicated and Controlled,  Endo/Other  diabetes, Well Controlled, Type 2, Oral Hypoglycemic Agents  Renal/GU      Musculoskeletal   Abdominal   Peds  Hematology   Anesthesia Other Findings   Reproductive/Obstetrics                           Anesthesia Physical Anesthesia Plan  ASA: III  Anesthesia Plan: General   Post-op Pain Management:    Induction: Intravenous, Rapid sequence and Cricoid pressure planned  Airway Management Planned: Oral ETT  Additional Equipment:   Intra-op Plan:   Post-operative Plan: Extubation in OR  Informed Consent: I have reviewed the patients History and Physical, chart, labs and discussed the procedure including the risks, benefits and alternatives for the proposed anesthesia with the patient or authorized representative who has indicated his/her understanding and acceptance.     Plan Discussed with:   Anesthesia Plan Comments:         Anesthesia Quick Evaluation

## 2012-10-15 NOTE — Brief Op Note (Addendum)
10/15/2012  10:14 AM  PATIENT:  [name]  79 y.o. male  PRE-OPERATIVE DIAGNOSIS:  left rotator cuff tear biceps tendinosis, impingement syndrome, a.c. joint arthrosis  POST-OPERATIVE DIAGNOSIS:  left rotator cuff tear, biceps tendinosis, impingement syndrome, a.c. joint arthrosis  PROCEDURE:  Procedure(s) with comments: SHOULDER ARTHROSCOPY WITH OPEN ROTATOR CUFF REPAIR (Left) - open at 0912; with bicep tendon debridement; removal of distal clavicle SHOULDER ACROMIOPLASTY (Left) Procedure shoulder arthroscopy with arthroscopic biceps tendon debridement limited, acromioplasty, arthroscopic removal of distal clavicle <1.0 cm). (this will be coded as shoulder arthroscopy extensive debridement) per cpt code update 2012   Open rotator cuff repair  Operative findings biceps tendinosis with intact biceps tendon, large impinging osteophyte from the acromioclavicular joint. Anterior acromial impingement. Supraspinatus tear full thickness and partial thickness no retraction measuring 2 cm.   SURGEON:  Surgeon(s) and Role:    * Vickki Hearing, MD - Primary  PHYSICIAN ASSISTANT:   ASSISTANTS: BETTY ASHLEY  ANESTHESIA:   general  EBL:  Total I/O In: 1000 [I.V.:1000] Out: -   BLOOD ADMINISTERED:none  DRAINS: none   LOCAL MEDICATIONS USED:  MARCAINE   , Amount: 60 ml and OTHER W EPI  SPECIMEN:  No Specimen  DISPOSITION OF SPECIMEN:  N/A  COUNTS:  YES  TOURNIQUET:  * No tourniquets in log *  DICTATION: .Dragon Dictation  PLAN OF CARE: To be determined after PACU evaluation  PATIENT DISPOSITION:  PACU - hemodynamically stable.   Delay start of Pharmacological VTE agent (>24hrs) due to surgical blood loss or risk of bleeding: not applicable

## 2012-10-18 ENCOUNTER — Ambulatory Visit (INDEPENDENT_AMBULATORY_CARE_PROVIDER_SITE_OTHER): Payer: Medicare Other | Admitting: Orthopedic Surgery

## 2012-10-18 VITALS — BP 122/60 | Ht 70.0 in | Wt 187.0 lb

## 2012-10-18 DIAGNOSIS — M7522 Bicipital tendinitis, left shoulder: Secondary | ICD-10-CM

## 2012-10-18 DIAGNOSIS — M7520 Bicipital tendinitis, unspecified shoulder: Secondary | ICD-10-CM

## 2012-10-18 DIAGNOSIS — M67919 Unspecified disorder of synovium and tendon, unspecified shoulder: Secondary | ICD-10-CM

## 2012-10-18 DIAGNOSIS — M7552 Bursitis of left shoulder: Secondary | ICD-10-CM

## 2012-10-18 DIAGNOSIS — S43429A Sprain of unspecified rotator cuff capsule, initial encounter: Secondary | ICD-10-CM

## 2012-10-18 DIAGNOSIS — M19019 Primary osteoarthritis, unspecified shoulder: Secondary | ICD-10-CM

## 2012-10-18 DIAGNOSIS — M75110 Incomplete rotator cuff tear or rupture of unspecified shoulder, not specified as traumatic: Secondary | ICD-10-CM

## 2012-10-18 NOTE — Progress Notes (Signed)
Patient ID: [name], male   DOB: 28-Mar-1930, 79 y.o.   MRN: [id number] Chief Complaint  Patient presents with  . Follow-up    post op 1 left rotator cuff surgery DOS 10/15/12    Postop #1 status post rotator cuff repair PRE-OPERATIVE DIAGNOSIS: left rotator cuff tear biceps tendinosis, impingement syndrome, a.c. joint arthrosis  POST-OPERATIVE DIAGNOSIS: left rotator cuff tear, biceps tendinosis, impingement syndrome, a.c. joint arthrosis  PROCEDURE: Procedure(s) with comments:  SHOULDER ARTHROSCOPY WITH OPEN ROTATOR CUFF REPAIR (Left) - open at 0912; with bicep tendon debridement; removal of distal clavicle  SHOULDER ACROMIOPLASTY (Left)  Procedure shoulder arthroscopy with arthroscopic biceps tendon debridement limited, acromioplasty, arthroscopic removal of distal clavicle.  Open rotator cuff repair  Operative findings biceps tendinosis with intact biceps tendon, large impinging osteophyte from the acromioclavicular joint. Anterior acromial impingement. Supraspinatus tear full thickness and partial thickness no retraction measuring 2 cm. Tear was chronic bursal side    He is doing well we change his dressing had a little bloody drainage. He is neurovascularly intact is placed in a sling shot  Return for suture removal  Will probably start his range of motion exercises at 1 month postop

## 2012-10-19 ENCOUNTER — Encounter (HOSPITAL_COMMUNITY): Payer: Self-pay | Admitting: Orthopedic Surgery

## 2012-10-25 ENCOUNTER — Encounter: Payer: Self-pay | Admitting: Orthopedic Surgery

## 2012-10-25 ENCOUNTER — Ambulatory Visit (INDEPENDENT_AMBULATORY_CARE_PROVIDER_SITE_OTHER): Payer: Medicare Other | Admitting: Orthopedic Surgery

## 2012-10-25 VITALS — BP 110/62 | Ht 70.0 in | Wt 187.0 lb

## 2012-10-25 DIAGNOSIS — S43429A Sprain of unspecified rotator cuff capsule, initial encounter: Secondary | ICD-10-CM

## 2012-10-25 DIAGNOSIS — M7520 Bicipital tendinitis, unspecified shoulder: Secondary | ICD-10-CM

## 2012-10-25 DIAGNOSIS — M7522 Bicipital tendinitis, left shoulder: Secondary | ICD-10-CM

## 2012-10-25 DIAGNOSIS — M75110 Incomplete rotator cuff tear or rupture of unspecified shoulder, not specified as traumatic: Secondary | ICD-10-CM

## 2012-10-25 DIAGNOSIS — M19019 Primary osteoarthritis, unspecified shoulder: Secondary | ICD-10-CM

## 2012-10-25 NOTE — Patient Instructions (Addendum)
Sling off   3 x a day   Do not move arm away from the body

## 2012-10-25 NOTE — Progress Notes (Signed)
Patient ID: [name], male   DOB: 02-25-1930, 79 y.o.   MRN: [id number] Chief Complaint  Patient presents with  . Follow-up    Post op 2 Left shoulder surgery DOS 10/15/12    Postop appointment to get his staples and sutures removed from the LEFT shoulder status post rotator cuff repair. He is in a sling shot. A stay in this for 4 weeks and then followup for six-week appointment and can start therapy at that time.  He can remove the sling 3 times a day as not to move his arm away from his body.  He will be out of work an additional 6 weeks than the original estimate.  This is primarily because he cannot return to work without restrictions.

## 2012-10-26 ENCOUNTER — Telehealth: Payer: Self-pay | Admitting: Orthopedic Surgery

## 2012-10-26 NOTE — Telephone Encounter (Signed)
Office notes + work notes faxed (DOS 10/25/12) as update to short-term disability claim, to [place], fax# [phone], ph# [phone]. Authorization on file.

## 2012-11-01 ENCOUNTER — Telehealth: Payer: Self-pay | Admitting: Orthopedic Surgery

## 2012-11-01 NOTE — Telephone Encounter (Signed)
6 WEEKS AFTER SURGERY

## 2012-11-01 NOTE — Telephone Encounter (Signed)
Mr. Sarin asked when he can start driving?  [phone]

## 2012-11-01 NOTE — Telephone Encounter (Signed)
Advised of doctor's reply °

## 2012-11-03 ENCOUNTER — Ambulatory Visit: Payer: Medicare Other | Admitting: Orthopedic Surgery

## 2012-11-23 ENCOUNTER — Encounter: Payer: Self-pay | Admitting: Orthopedic Surgery

## 2012-11-23 ENCOUNTER — Ambulatory Visit (INDEPENDENT_AMBULATORY_CARE_PROVIDER_SITE_OTHER): Payer: Medicare Other | Admitting: Orthopedic Surgery

## 2012-11-23 VITALS — BP 120/60 | Ht 70.0 in | Wt 187.0 lb

## 2012-11-23 DIAGNOSIS — S43422D Sprain of left rotator cuff capsule, subsequent encounter: Secondary | ICD-10-CM

## 2012-11-23 DIAGNOSIS — Z5189 Encounter for other specified aftercare: Secondary | ICD-10-CM

## 2012-11-23 NOTE — Progress Notes (Signed)
Patient ID: [name], male   DOB: 1930/06/28, 79 y.o.   MRN: [id number] Postop visit status post rotator cuff repair left shoulder  Surgery was March 14  She's been in a sling  He is ready to start therapy  Is not taking any pain medicine wound looks good  Start therapy followup 6 weeks

## 2012-11-23 NOTE — Patient Instructions (Signed)
Call to make therapy appointment

## 2012-11-24 ENCOUNTER — Telehealth: Payer: Self-pay | Admitting: Orthopedic Surgery

## 2012-11-24 NOTE — Telephone Encounter (Signed)
Office notes + work notes faxed (DOS 11/23/12) as update to short-term disability claim, to [place], fax# [phone], ph# [phone].  Authorization on file.

## 2012-11-29 ENCOUNTER — Ambulatory Visit (HOSPITAL_COMMUNITY)
Admission: RE | Admit: 2012-11-29 | Discharge: 2012-11-29 | Disposition: A | Discharge status: Home / Self Care | Payer: Medicare Other | Source: Ambulatory Visit | Attending: Orthopedic Surgery | Admitting: Orthopedic Surgery

## 2012-11-29 DIAGNOSIS — M25519 Pain in unspecified shoulder: Secondary | ICD-10-CM | POA: Insufficient documentation

## 2012-11-29 DIAGNOSIS — I10 Essential (primary) hypertension: Secondary | ICD-10-CM | POA: Insufficient documentation

## 2012-11-29 DIAGNOSIS — E119 Type 2 diabetes mellitus without complications: Secondary | ICD-10-CM | POA: Insufficient documentation

## 2012-11-29 DIAGNOSIS — IMO0001 Reserved for inherently not codable concepts without codable children: Secondary | ICD-10-CM | POA: Insufficient documentation

## 2012-11-29 NOTE — Evaluation (Signed)
Occupational Therapy Evaluation  Patient Details  Name: [name] MRN: [id number] Date of Birth: 1929-08-25  Today's Date: 11/29/2012 Time: 7829-5621 OT Time Calculation (min): 35 min OT eval [phone]  Visit#: 1 of 12  Re-eval: 12/27/12  Assessment Diagnosis: Lt shoulder bursitis Surgical Date: 10/15/12 Next MD Visit: 01/04/13 Prior Therapy: None  Authorization: [employer] Medicare  Authorization Time Period: before 10th visit  Authorization Visit#: 1 of 10   Past Medical History:  Past Medical History  Diagnosis Date  . Diabetes mellitus   . Hypertension   . Hyperchloremia   . GERD (gastroesophageal reflux disease)   . Arthritis    Past Surgical History:  Past Surgical History  Procedure Laterality Date  . Tonsillectomy    . Knee arthroscopy    . Sholder arthroscopy with open rotator cuff repair Left 10/15/2012    Procedure: SHOULDER ARTHROSCOPY WITH OPEN ROTATOR CUFF REPAIR;  Surgeon: Vickki Hearing, MD;  Location: [facility];  Service: Orthopedics;  Laterality: Left;  open at 0912; with bicep tendon debridement;   . Shoulder acromioplasty Left 10/15/2012    Procedure: SHOULDER ACROMIOPLASTY;  Surgeon: Vickki Hearing, MD;  Location: [facility];  Service: Orthopedics;  Laterality: Left;  . Resection distal clavical Left 10/15/2012    Procedure: RESECTION DISTAL CLAVICAL;  Surgeon: Vickki Hearing, MD;  Location: [facility];  Service: Orthopedics;  Laterality: Left;    Subjective Symptoms/Limitations Symptoms: S: I want to be able to do everything I did before. The doctor said it just needs to be freed up.  Pertinent History: Strother is a 79 y/o male s/p Left bursitis sx presenting with left shoulder pain, decreased ROM, and increased fascial restrictions. Dr. Romeo Apple has referred patient to occupational therapy for evaluation and treatment.  Limitations: lifting, reaching overhead, unable to ride tractor, decreased comfort during sleep, unable to work at  [employer] Repetition: Increases Symptoms Special Tests: DASH 42.5 with ideal score being 0. Patient Stated Goals: To get my arm back to normal and return to work. Pain Assessment Currently in Pain?: No/denies (only during movement.)  Precautions/Restrictions  Precautions Precautions: Shoulder  Balance Screening Balance Screen Has the patient fallen in the past 6 months: No  Prior Functioning  Home Living Lives With: Spouse Prior Function Level of Independence: Independent with basic ADLs;Independent with gait Able to Take Stairs?: Yes Vocation: Part time employment Vocation Requirements: Biochemist, clinical and odd jobs Leisure: Hobbies-yes (Comment) Comments: being outside on [place], gardening  Assessment ADL/Vision/Perception ADL ADL Comments: difficulty with reaching overhead, donning shirt, lifting heavy items Dominant Hand: Right Vision - History Baseline Vision: Wears glasses all the time  Cognition/Observation Cognition Overall Cognitive Status: Within Functional Limits for tasks assessed Arousal/Alertness: Awake/alert Orientation Level: Oriented X4 Comments: HOH. Hearing aid Rt. ear   Additional Assessments LUE Assessment LUE Assessment:  (assessed supine. IR/ER ADD) LUE AROM (degrees) Left Shoulder Flexion: 90 Degrees Left Shoulder ABduction: 79 Degrees Left Shoulder Internal Rotation: 75 Degrees Left Shoulder External Rotation: 49 Degrees LUE PROM (degrees) Left Shoulder Flexion: 105 Degrees Left Shoulder ABduction: 82 Degrees Left Shoulder Internal Rotation: 82 Degrees Left Shoulder External Rotation: 51 Degrees LUE Strength Left Shoulder Flexion: 3-/5 Left Shoulder ABduction: 3-/5 Left Shoulder Internal Rotation: 3/5 Left Shoulder External Rotation: 3/5 Palpation Palpation: Max fascial restrictions felt in bicep and trapezius region.       Occupational Therapy Assessment and Plan OT Assessment and Plan Clinical Impression Statement:  A:   Patient presents s/p :eft shoulder bursitis with decreased  PROM and strength and increased pain and fascial restrictions. These deficts are causing decreased independence with daily activities. Pt will benefit from skilled therapeutic intervention in order to improve on the following deficits: Impaired UE functional use;Increased fascial restricitons;Pain;Decreased strength Rehab Potential: Excellent OT Frequency: Min 2X/week OT Duration: 6 weeks OT Treatment/Interventions: Self-care/ADL training;Therapeutic activities;Therapeutic exercise;Manual therapy;Modalities;Patient/family education OT Plan: P:  Skilled OT intervention to decrease pain and fascial restrictions and increase pain free mobility and strength in his left shoulder for increased independence with daily activities.  Treatment Plan: Myofascial release, PROM, isometric strengthening, bridges, elev, ext, row.  pulleys. Progress as tolerater.   Goals Short Term Goals Time to Complete Short Term Goals: 3 weeks Short Term Goal 1: Patient will be educated on HEP. Short Term Goal 2:  Patient will increase PROM to [facility] for increased independence with donning and doffing jackets and shirts. Short Term Goal 3: Patient will increase his left shoulder strength to 3+/5 for increased ability to lift arm overhead into cabinets. Short Term Goal 4: Patient will decrease the pain level in his left shoulder to 4/10 with activity and when sleeping. Short Term Goal 5: Patient will decrease fascial restrictions from mod-max to moderate in his left shoulder region for greater mobility with B/IADLs.  Long Term Goals Time to Complete Long Term Goals: 6 weeks Long Term Goal 1: Patient will return to prior level of independence with all daily, leisure, and work activities. Long Term Goal 2:  Patient will increase AROM to [facility] for increased independence with donning and doffing jackets and shirts. Long Term Goal 3: Patient will increase his left shoulder  strength to 4/5 for increased ability to lift arm overhead into cabinets. Long Term Goal 4: Patient will decrease the pain level in his left shoulder to 1/10 with activity and when sleeping. Long Term Goal 5: Patient will decrease fascial restrictions from moderate to trace in his left shoulder region for greater mobility with B/IADLs.   Problem List Patient Active Problem List  Diagnosis  . Bursitis of shoulder, left  . Pain in joint, shoulder region  . Muscle weakness (generalized)  . Adhesive bursitis of left shoulder  . Partial tear of rotator cuff  . Arthritis, shoulder region  . Biceps tendonitis on left    End of Session Activity Tolerance: Patient tolerated treatment well General Behavior During Therapy: WFL for tasks assessed/performed Cognition: WFL for tasks performed OT Plan of Care OT Home Exercise Plan: towels slides and pendulum OT Patient Instructions: handout Consulted and Agree with Plan of Care: Family member/caregiver;Patient Family Member Consulted: wife  GO Functional Assessment Tool Used: DASH 42.5 with ideal score being 0. Functional Limitation: Carrying, moving and handling objects Carrying, Moving and Handling Objects Current Status [phone]): At least 40 percent but less than 60 percent impaired, limited or restricted Carrying, Moving and Handling Objects Goal Status [phone]): At least 1 percent but less than 20 percent impaired, limited or restricted  Limmie Patricia, OTR/L,CBIS   11/29/2012, 1:38 PM  Physician Documentation Your signature is required to indicate approval of the treatment plan as stated above.  Please sign and either send electronically or make a copy of this report for your files and return this physician signed original.  Please mark one 1.__approve of plan  2. ___approve of plan with the following conditions.   ______________________________  _____________________ Physician  Signature                                                                                                             Date

## 2012-12-07 ENCOUNTER — Ambulatory Visit (HOSPITAL_COMMUNITY)
Admission: RE | Admit: 2012-12-07 | Discharge: 2012-12-07 | Disposition: A | Discharge status: Home / Self Care | Payer: Medicare Other | Source: Ambulatory Visit | Attending: Orthopedic Surgery | Admitting: Orthopedic Surgery

## 2012-12-07 DIAGNOSIS — I10 Essential (primary) hypertension: Secondary | ICD-10-CM | POA: Insufficient documentation

## 2012-12-07 DIAGNOSIS — E119 Type 2 diabetes mellitus without complications: Secondary | ICD-10-CM | POA: Insufficient documentation

## 2012-12-07 DIAGNOSIS — M25519 Pain in unspecified shoulder: Secondary | ICD-10-CM | POA: Insufficient documentation

## 2012-12-07 DIAGNOSIS — IMO0001 Reserved for inherently not codable concepts without codable children: Secondary | ICD-10-CM | POA: Insufficient documentation

## 2012-12-07 NOTE — Progress Notes (Signed)
Occupational Therapy Treatment Patient Details  Name: [name] MRN: [id number] Date of Birth: May 30, 1930  Today's Date: 12/07/2012 Time: 1020-1100 OT Time Calculation (min): 40 min MFR 1020-1037 17' Therex 1037-1100 23'  Visit#: 2 of 12  Re-eval: 12/27/12    Authorization: [employer]  Authorization Time Period: before 10th visit  Authorization Visit#: 2 of 10  Subjective Symptoms/Limitations Pertinent History: Surgery on 10/15/12 was left rotator cuff repair.   Limitations: Dr. Mort Sawyers protocol:  First 3 weeks of therapy through 5/19:  PROM only of shoulder, AROM of elbow-hand.  5/20-6/2:  continue PROM, scapular stability (ext, row strengthening).  6/3 and beyond, begin AAROM and progress to AROM as tolerated.   Pain Assessment Currently in Pain?: No/denies  Precautions/Restrictions  Precautions Precautions: Shoulder Type of Shoulder Precautions: Rotator Cuff Protocol  Exercise/Treatments Supine Protraction: PROM;10 reps Horizontal ABduction: PROM;10 reps External Rotation: PROM;10 reps Internal Rotation: PROM;10 reps Flexion: PROM;10 reps ABduction: PROM;10 reps Seated Elevation: AROM;10 reps Extension: AROM;10 reps Row: AROM;10 reps Therapy Ball Flexion: 15 reps ABduction: 15 reps ROM / Strengthening / Isometric Strengthening   Flexion: Supine;3X3" Extension: Supine;3X3" External Rotation: Supine;3X3" Internal Rotation: Supine;3X3" ABduction: Supine;3X3" ADduction: Supine;3X3"    Manual Therapy Manual Therapy: Myofascial release Myofascial Release: MFR and manual stretching to left upper arm, scapularis and trapezius region to decrease fascial restrictions and increase joint mobility in a pain free zone.   Occupational Therapy Assessment and Plan OT Assessment and Plan Clinical Impression Statement: A: pt tolerated exercises with minimal pain during manual stretching.  OT Plan: P: Increase isometric exercises to 3x5   Goals Short Term  Goals Short Term Goal 1 Progress: Progressing toward goal Short Term Goal 2:  Patient will increase PROM to [facility] for increased independence with donning and doffing jackets and shirts. Short Term Goal 2 Progress: Progressing toward goal Short Term Goal 3: Patient will increase his left shoulder strength to 3+/5 for increased ability to lift arm overhead into cabinets. Short Term Goal 3 Progress: Progressing toward goal Short Term Goal 4: Patient will decrease the pain level in his left shoulder to 4/10 with activity and when sleeping. Short Term Goal 4 Progress: Progressing toward goal Short Term Goal 5: Patient will decrease fascial restrictions from mod-max to moderate in his left shoulder region for greater mobility with B/IADLs.  Long Term Goals Long Term Goal 1: Patient will return to prior level of independence with all daily, leisure, and work activities. Long Term Goal 1 Progress: Progressing toward goal Long Term Goal 2:  Patient will increase AROM to [facility] for increased independence with donning and doffing jackets and shirts. Long Term Goal 2 Progress: Progressing toward goal Long Term Goal 3: Patient will increase his left shoulder strength to 4/5 for increased ability to lift arm overhead into cabinets. Long Term Goal 3 Progress: Progressing toward goal Long Term Goal 4: Patient will decrease the pain level in his left shoulder to 1/10 with activity and when sleeping. Long Term Goal 4 Progress: Progressing toward goal Long Term Goal 5: Patient will decrease fascial restrictions from moderate to trace in his left shoulder region for greater mobility with B/IADLs.   Problem List Patient Active Problem List   Diagnosis Date Noted  . Arthritis, shoulder region 10/15/2012  . Biceps tendonitis on left 10/15/2012  . Adhesive bursitis of left shoulder 09/01/2012  . Partial tear of rotator cuff 09/01/2012  . Pain in joint, shoulder region 07/09/2011  . Muscle weakness (generalized)  07/09/2011  . Bursitis of  shoulder, left 07/03/2011    End of Session Activity Tolerance: Patient tolerated treatment well General Behavior During Therapy: [facility] for tasks assessed/performed Cognition: [facility] for tasks performed   Limmie Patricia, OTR/L,CBIS   12/07/2012, 1:11 PM

## 2012-12-09 ENCOUNTER — Ambulatory Visit (HOSPITAL_COMMUNITY)
Admission: RE | Admit: 2012-12-09 | Discharge: 2012-12-09 | Disposition: A | Discharge status: Home / Self Care | Payer: Medicare Other | Source: Ambulatory Visit | Attending: Orthopedic Surgery | Admitting: Orthopedic Surgery

## 2012-12-09 NOTE — Progress Notes (Signed)
Occupational Therapy Treatment Patient Details  Name: [name] MRN: [id number] Date of Birth: August 17, 1929  Today's Date: 12/09/2012 Time: 4034-7425 OT Time Calculation (min): 42 min MFR 1026-1040 19' Therex [id number] 23'  Visit#: 3 of 12  Re-eval: 12/27/12    Authorization: [employer]  Authorization Time Period: before 10th visit  Authorization Visit#: 3 of 10  Subjective Symptoms/Limitations Symptoms: S: I've been doing a lot outside in the garden. Pain Assessment Currently in Pain?: No/denies  Precautions/Restrictions  Precautions Precautions: Shoulder Type of Shoulder Precautions: Rotator Cuff Protocol  Exercise/Treatments Supine Protraction: PROM;10 reps Horizontal ABduction: PROM;10 reps External Rotation: PROM;10 reps Internal Rotation: PROM;10 reps Flexion: PROM;10 reps ABduction: PROM;10 reps Seated Elevation: AROM;15 reps Extension: AROM;15 reps Row: AROM;15 reps Therapy Ball Flexion: 15 reps ABduction: 15 reps ROM / Strengthening / Isometric Strengthening Prot/Ret//Elev/Dep: 1' Flexion: Supine;3X5" Extension: Supine;3X5" External Rotation: Supine;3X5" Internal Rotation: Supine;3X5" ABduction: Supine;3X5" ADduction: Supine;3X5"     Manual Therapy Manual Therapy: Myofascial release Myofascial Release: MFR and manual stretching to left upper arm, scapularis and trapezius region to decrease fascial restrictions and increase joint mobility in a pain free zone.  Occupational Therapy Assessment and Plan OT Assessment and Plan Clinical Impression Statement: A: increased time during supine isometrics and tolerated well. Added pro/ret/elev/dep. OT Plan: P: increase isometric exercises to 5x5   Goals Short Term Goals Short Term Goal 2:  Patient will increase PROM to [facility] for increased independence with donning and doffing jackets and shirts. Short Term Goal 3: Patient will increase his left shoulder strength to 3+/5 for increased ability to lift  arm overhead into cabinets. Short Term Goal 4: Patient will decrease the pain level in his left shoulder to 4/10 with activity and when sleeping. Short Term Goal 5: Patient will decrease fascial restrictions from mod-max to moderate in his left shoulder region for greater mobility with B/IADLs.  Long Term Goals Long Term Goal 1: Patient will return to prior level of independence with all daily, leisure, and work activities. Long Term Goal 2:  Patient will increase AROM to [id number] for increased independence with donning and doffing jackets and shirts. Long Term Goal 3: Patient will increase his left shoulder strength to 4/5 for increased ability to lift arm overhead into cabinets. Long Term Goal 4: Patient will decrease the pain level in his left shoulder to 1/10 with activity and when sleeping. Long Term Goal 5: Patient will decrease fascial restrictions from moderate to trace in his left shoulder region for greater mobility with B/IADLs.   Problem List Patient Active Problem List   Diagnosis Date Noted  . Arthritis, shoulder region 10/15/2012  . Biceps tendonitis on left 10/15/2012  . Adhesive bursitis of left shoulder 09/01/2012  . Partial tear of rotator cuff 09/01/2012  . Pain in joint, shoulder region 07/09/2011  . Muscle weakness (generalized) 07/09/2011  . Bursitis of shoulder, left 07/03/2011    End of Session Activity Tolerance: Patient tolerated treatment well General Behavior During Therapy: [facility] for tasks assessed/performed Cognition: [facility] for tasks performed   Limmie Patricia, OTR/L,CBIS   12/09/2012, 11:27 AM

## 2012-12-14 ENCOUNTER — Ambulatory Visit (HOSPITAL_COMMUNITY)
Admission: RE | Admit: 2012-12-14 | Discharge: 2012-12-14 | Disposition: A | Discharge status: Home / Self Care | Payer: Medicare Other | Source: Ambulatory Visit | Attending: Orthopedic Surgery | Admitting: Orthopedic Surgery

## 2012-12-14 NOTE — Progress Notes (Signed)
Occupational Therapy Treatment Patient Details  Name: [name] MRN: [id number] Date of Birth: 08/17/1929  Today's Date: 12/14/2012 Time: 1020-1057 OT Time Calculation (min): 37 min Manual Therapy 1020-1034 14' Therapeutic Exercisese 4098-1191 23' Visit#: 4 of 12  Re-eval: 12/27/12    Authorization: [employer]  Authorization Time Period: before 10th visit  Authorization Visit#: 4 of 10  Subjective S:  Its getting easier or less painful to do things around the house.  (reminded patient of precautions - no lifting and no AROM of shoulder above waist height) Limitations: Dr. Mort Sawyers protocol:  First 3 weeks of therapy through 5/19:  PROM only of shoulder, AROM of elbow-hand.  5/20-6/2:  continue PROM, scapular stability (ext, row strengthening).  6/3 and beyond, begin AAROM and progress to AROM as tolerated.   Pain Assessment Currently in Pain?: Yes Pain Score:   3 Pain Location: Shoulder Pain Orientation: Left Pain Type: Acute pain  Precautions/Restrictions    Dr. Mort Sawyers protocol:  First 3 weeks of therapy through 5/19:  PROM only of shoulder, AROM of elbow-hand.  5/20-6/2:  continue PROM, scapular stability (ext, row strengthening).  6/3 and beyond, begin AAROM and progress to AROM as tolerated.    Exercise/Treatments Supine Protraction: PROM;10 reps Horizontal ABduction: PROM;10 reps External Rotation: PROM;10 reps Internal Rotation: PROM;10 reps Flexion: PROM;10 reps ABduction: PROM;10 reps Other Supine Exercises: bridges 20 times  Seated Elevation: AROM;15 reps Extension: AROM;15 reps Row: AROM;15 reps Other Seated Exercises: elbow flexion/extension, supination/pronation, wrist flexion/extension X 10 reps with 1# resistance.  Therapy Ball Flexion: 20 reps ABduction: 20 reps ROM / Strengthening / Isometric Strengthening Prot/Ret//Elev/Dep: 1' with max vg and tactile cuing for proper sequencing of exercise. Flexion: Supine;5X5" Extension:  Supine;5X5" External Rotation: Supine;5X5" Internal Rotation: Supine;5X5" ABduction: Supine;5X5" ADduction: Supine;5X5"    Manual Therapy Manual Therapy: Myofascial release Myofascial Release: MFR and manual stretching to left upper arm, scapularis and trapezius region to decrease fascial restrictions and increase joint mobility in a pain free zone within parameters of protocol.  Occupational Therapy Assessment and Plan OT Assessment and Plan Clinical Impression Statement: A:  Requires mod vg with bicep curl for proper technique.  Abduction is most painful movement for patient, both at home and in the clinic.  OT Plan: P:  Increase to 3X10" for isometric strengthening in supine. Decrease pain with abduction while increasing PROM.    Goals Short Term Goals Short Term Goal 1 Progress: Progressing toward goal Short Term Goal 2:  Patient will increase PROM to [facility] for increased independence with donning and doffing jackets and shirts. Short Term Goal 3: Patient will increase his left shoulder strength to 3+/5 for increased ability to lift arm overhead into cabinets. Short Term Goal 3 Progress: Progressing toward goal Short Term Goal 4: Patient will decrease the pain level in his left shoulder to 4/10 with activity and when sleeping. Short Term Goal 4 Progress: Progressing toward goal Short Term Goal 5: Patient will decrease fascial restrictions from mod-max to moderate in his left shoulder region for greater mobility with B/IADLs.  Short Term Goal 5 Progress: Progressing toward goal Long Term Goals Long Term Goal 1: Patient will return to prior level of independence with all daily, leisure, and work activities. Long Term Goal 1 Progress: Progressing toward goal Long Term Goal 2:  Patient will increase AROM to [facility] for increased independence with donning and doffing jackets and shirts. Long Term Goal 2 Progress: Progressing toward goal Long Term Goal 3: Patient will increase his left shoulder  strength to 4/5 for increased ability to lift arm overhead into cabinets. Long Term Goal 3 Progress: Progressing toward goal Long Term Goal 4: Patient will decrease the pain level in his left shoulder to 1/10 with activity and when sleeping. Long Term Goal 4 Progress: Progressing toward goal Long Term Goal 5: Patient will decrease fascial restrictions from moderate to trace in his left shoulder region for greater mobility with B/IADLs.  Long Term Goal 5 Progress: Progressing toward goal  Problem List Patient Active Problem List   Diagnosis Date Noted  . Arthritis, shoulder region 10/15/2012  . Biceps tendonitis on left 10/15/2012  . Adhesive bursitis of left shoulder 09/01/2012  . Partial tear of rotator cuff 09/01/2012  . Pain in joint, shoulder region 07/09/2011  . Muscle weakness (generalized) 07/09/2011  . Bursitis of shoulder, left 07/03/2011    End of Session Activity Tolerance: Patient tolerated treatment well General Behavior During Therapy: [facility] for tasks assessed/performed Cognition: WFL for tasks performed  GO    Shirlean Mylar, OTR/L  12/14/2012, 10:58 AM

## 2012-12-16 ENCOUNTER — Inpatient Hospital Stay (HOSPITAL_COMMUNITY): Admission: RE | Admit: 2012-12-16 | Payer: Medicare Other | Source: Ambulatory Visit

## 2012-12-17 ENCOUNTER — Ambulatory Visit (HOSPITAL_COMMUNITY)
Admission: RE | Admit: 2012-12-17 | Discharge: 2012-12-17 | Disposition: A | Discharge status: Home / Self Care | Payer: Medicare Other | Source: Ambulatory Visit | Attending: Orthopedic Surgery | Admitting: Orthopedic Surgery

## 2012-12-17 NOTE — Progress Notes (Signed)
Occupational Therapy Treatment Patient Details  Name: [name] MRN: [id number] Date of Birth: 1929/11/16  Today's Date: 12/17/2012 Time: 4098-1191 OT Time Calculation (min): 38 min Manual Therapy 932-944 12' Therapeutic Exercises [phone] 26' Visit#: 5 of 12  Re-eval: 12/27/12    Authorization: [employer]  Authorization Time Period: before 10th visit  Authorization Visit#: 5 of 10  Subjective Symptoms/Limitations Symptoms: S:  It hurts a little bit in my arm.  Limitations: Dr. Mort Sawyers protocol:  First 3 weeks of therapy through 5/19:  PROM only of shoulder, AROM of elbow-hand.  5/20-6/2:  continue PROM, scapular stability (ext, row strengthening).  6/3 and beyond, begin AAROM and progress to AROM as tolerated.   Pain Assessment Currently in Pain?: Yes Pain Score:   3 Pain Location: Shoulder Pain Orientation: Left Pain Type: Acute pain  Precautions/Restrictions    Dr. Mort Sawyers protocol:  First 3 weeks of therapy through 5/19:  PROM only of shoulder, AROM of elbow-hand.  5/20-6/2:  continue PROM, scapular stability (ext, row strengthening).  6/3 and beyond, begin AAROM and progress to AROM as tolerated.   Exercise/Treatments Supine Protraction: PROM;10 reps Horizontal ABduction: PROM;10 reps External Rotation: PROM;10 reps Internal Rotation: PROM;10 reps Flexion: PROM;10 reps ABduction: PROM;10 reps Other Supine Exercises: bridges 20 times  Seated Elevation: AROM;15 reps Extension: AROM;15 reps Row: AROM;15 reps Other Seated Exercises: elbow flexion/extension, supination/pronation, wrist flexion/extension X 15 reps with 1# resistance.  Therapy Ball Flexion: 20 reps ABduction: 20 reps ROM / Strengthening / Isometric Strengthening Prot/Ret//Elev/Dep: 1' with max vg and tactile cuing for proper sequencing of exercise. Flexion: Supine;Other (comment) (3 X 10") Extension: Supine;Other (comment) (3 X 10") External Rotation: Supine (3X 10") Internal Rotation:  Supine (3 X 10") ABduction: Supine (3 X 10") ADduction: Supine;Other (comment) (3 X 10")    Manual Therapy Manual Therapy: Myofascial release Myofascial Release: MFR and manual stretching to left upper arm, scapularis and trapezius region to decrease fascial restrictions and increase joint mobility in a pain free zone within parameters of protocol.  Occupational Therapy Assessment and Plan OT Assessment and Plan Clinical Impression Statement: A:  Hard end feel at 100 degrees of PROM flexion in supine.  less cuing required with elbow flexion exercises.   OT Plan: P:  Add tband for scapular strengthening of extension, row.  Increase PROM to [facility] for all shoulder ranges in supine for greater mobility and I with BADLS.   Goals Short Term Goals Short Term Goal 2:  Patient will increase PROM to [facility] for increased independence with donning and doffing jackets and shirts. Short Term Goal 3: Patient will increase his left shoulder strength to 3+/5 for increased ability to lift arm overhead into cabinets. Short Term Goal 4: Patient will decrease the pain level in his left shoulder to 4/10 with activity and when sleeping. Short Term Goal 5: Patient will decrease fascial restrictions from mod-max to moderate in his left shoulder region for greater mobility with B/IADLs.  Long Term Goals Long Term Goal 1: Patient will return to prior level of independence with all daily, leisure, and work activities. Long Term Goal 2:  Patient will increase AROM to [facility] for increased independence with donning and doffing jackets and shirts. Long Term Goal 3: Patient will increase his left shoulder strength to 4/5 for increased ability to lift arm overhead into cabinets. Long Term Goal 4: Patient will decrease the pain level in his left shoulder to 1/10 with activity and when sleeping. Long Term Goal 5: Patient will decrease fascial  restrictions from moderate to trace in his left shoulder region for greater mobility with  B/IADLs.   Problem List Patient Active Problem List   Diagnosis Date Noted  . Arthritis, shoulder region 10/15/2012  . Biceps tendonitis on left 10/15/2012  . Adhesive bursitis of left shoulder 09/01/2012  . Partial tear of rotator cuff 09/01/2012  . Pain in joint, shoulder region 07/09/2011  . Muscle weakness (generalized) 07/09/2011  . Bursitis of shoulder, left 07/03/2011    End of Session Activity Tolerance: Patient tolerated treatment well General Behavior During Therapy: [facility] for tasks assessed/performed Cognition: WFL for tasks performed  GO    Shirlean Mylar, OTR/L  12/17/2012, 10:07 AM

## 2012-12-21 ENCOUNTER — Other Ambulatory Visit: Payer: Self-pay | Admitting: *Deleted

## 2012-12-21 ENCOUNTER — Ambulatory Visit (HOSPITAL_COMMUNITY)
Admission: RE | Admit: 2012-12-21 | Discharge: 2012-12-21 | Disposition: A | Discharge status: Home / Self Care | Payer: Medicare Other | Source: Ambulatory Visit | Attending: Family Medicine | Admitting: Family Medicine

## 2012-12-21 DIAGNOSIS — M75102 Unspecified rotator cuff tear or rupture of left shoulder, not specified as traumatic: Secondary | ICD-10-CM

## 2012-12-21 MED ORDER — IBUPROFEN 800 MG PO TABS: 800.0000 mg | ORAL_TABLET | Freq: Three times a day (TID) | ORAL | Status: DC | PRN

## 2012-12-21 NOTE — Progress Notes (Signed)
Occupational Therapy Treatment Patient Details  Name: [name] MRN: [id number] Date of Birth: Aug 11, 1929  Today's Date: 12/21/2012 Time: 4098-1191 OT Time Calculation (min): 46 min Manual Therapy [id number] 25' Therpeutic Exercises 1308-6578 21' Visit#: 6 of 12  Re-eval: 12/27/12    Authorization: [employer] Medicare  Authorization Time Period: before 10th visit  Authorization Visit#: 6 of 10  Subjective Symptoms/Limitations Symptoms: S:  It feels ok, I cut my thumb working on a tractor. Limitations: Dr. Mort Sawyers protocol:  First 3 weeks of therapy through 5/19:  PROM only of shoulder, AROM of elbow-hand.  5/20-6/2:  continue PROM, scapular stability (ext, row strengthening).  6/3 and beyond, begin AAROM and progress to AROM as tolerated.   Pain Assessment Currently in Pain?: Yes Pain Score:   3 Pain Location: Shoulder Pain Orientation: Left Pain Type: Acute pain  Precautions/Restrictions    Dr. Mort Sawyers protocol:  First 3 weeks of therapy through 5/19:  PROM only of shoulder, AROM of elbow-hand.  5/20-6/2:  continue PROM, scapular stability (ext, row strengthening).  6/3 and beyond, begin AAROM and progress to AROM as tolerated.   Exercise/Treatments Supine Protraction: PROM;10 reps Horizontal ABduction: PROM;10 reps External Rotation: PROM;10 reps Internal Rotation: PROM;10 reps Flexion: PROM;10 reps ABduction: PROM;10 reps Other Supine Exercises: bridges 20 times  Seated Elevation: AROM;15 reps Extension: Theraband;10 reps Theraband Level (Shoulder Extension): Level 2 (Red) Retraction: Theraband;10 reps Theraband Level (Shoulder Retraction): Level 2 (Red) Row: Theraband;10 reps Theraband Level (Shoulder Row): Level 2 (Red) Other Seated Exercises: resume next visit Therapy Ball Flexion: 20 reps ABduction: 20 reps ROM / Strengthening / Isometric Strengthening Proximal Shoulder Strengthening, Supine: 10 reps in supine without resting between activities, with min  hand over hand facilitation by OTR/L Prot/Ret//Elev/Dep: resume next visit Flexion: Supine;Other (comment) (3 X 10") Extension: Supine;Other (comment) (3 X 10") External Rotation: Supine (3X 10") Internal Rotation: Supine (3 X 10") ABduction: Supine (3 X 10") ADduction: Supine;Other (comment) (3 X 10")    Manual Therapy Manual Therapy: Myofascial release Myofascial Release: MFR and manual stretching to left upper arm, scapularis and trapezius region to decrease fascial restrictions and increase joint mobility in a pain free zone within parameters of protocol.  Occupational Therapy Assessment and Plan OT Assessment and Plan Clinical Impression Statement: A:  Per protocol added scapular stability exercises in seated and supine.  Required hand over hand assist with theraband exercises, for technique only. OT Plan: P:  Increase Independence with technique of exercises.  Add serratus anterior punch in supine for added scapular stability needed for functional use of LUE with daily activities.    Goals Short Term Goals Short Term Goal 1 Progress: Progressing toward goal Short Term Goal 2:  Patient will increase PROM to [facility] for increased independence with donning and doffing jackets and shirts. Short Term Goal 2 Progress: Progressing toward goal Short Term Goal 3: Patient will increase his left shoulder strength to 3+/5 for increased ability to lift arm overhead into cabinets. Short Term Goal 3 Progress: Progressing toward goal Short Term Goal 4: Patient will decrease the pain level in his left shoulder to 4/10 with activity and when sleeping. Short Term Goal 4 Progress: Progressing toward goal Short Term Goal 5: Patient will decrease fascial restrictions from mod-max to moderate in his left shoulder region for greater mobility with B/IADLs.  Short Term Goal 5 Progress: Progressing toward goal Long Term Goals Long Term Goal 1: Patient will return to prior level of independence with all daily,  leisure, and work activities. Long  Term Goal 1 Progress: Progressing toward goal Long Term Goal 2:  Patient will increase AROM to [facility] for increased independence with donning and doffing jackets and shirts. Long Term Goal 2 Progress: Progressing toward goal Long Term Goal 3: Patient will increase his left shoulder strength to 4/5 for increased ability to lift arm overhead into cabinets. Long Term Goal 3 Progress: Progressing toward goal Long Term Goal 4: Patient will decrease the pain level in his left shoulder to 1/10 with activity and when sleeping. Long Term Goal 4 Progress: Progressing toward goal Long Term Goal 5: Patient will decrease fascial restrictions from moderate to trace in his left shoulder region for greater mobility with B/IADLs.  Long Term Goal 5 Progress: Progressing toward goal  Problem List Patient Active Problem List   Diagnosis Date Noted  . Arthritis, shoulder region 10/15/2012  . Biceps tendonitis on left 10/15/2012  . Adhesive bursitis of left shoulder 09/01/2012  . Partial tear of rotator cuff 09/01/2012  . Pain in joint, shoulder region 07/09/2011  . Muscle weakness (generalized) 07/09/2011  . Bursitis of shoulder, left 07/03/2011    End of Session Activity Tolerance: Patient tolerated treatment well General Behavior During Therapy: [facility] for tasks assessed/performed Cognition: WFL for tasks performed   Shirlean Mylar, OTR/L  12/21/2012, 11:56 AM

## 2012-12-23 ENCOUNTER — Telehealth: Payer: Self-pay | Admitting: Orthopedic Surgery

## 2012-12-23 ENCOUNTER — Emergency Department (HOSPITAL_COMMUNITY)
Admission: EM | Admit: 2012-12-23 | Discharge: 2012-12-23 | Disposition: A | Discharge status: Home / Self Care | Payer: Medicare Other | Attending: Emergency Medicine | Admitting: Emergency Medicine

## 2012-12-23 ENCOUNTER — Encounter (HOSPITAL_COMMUNITY): Payer: Self-pay | Admitting: Emergency Medicine

## 2012-12-23 ENCOUNTER — Ambulatory Visit (HOSPITAL_COMMUNITY)
Admission: RE | Admit: 2012-12-23 | Discharge: 2012-12-23 | Disposition: A | Discharge status: Home / Self Care | Payer: Medicare Other | Source: Ambulatory Visit | Attending: Orthopedic Surgery | Admitting: Orthopedic Surgery

## 2012-12-23 ENCOUNTER — Emergency Department (HOSPITAL_COMMUNITY): Payer: Medicare Other

## 2012-12-23 DIAGNOSIS — Z87891 Personal history of nicotine dependence: Secondary | ICD-10-CM | POA: Insufficient documentation

## 2012-12-23 DIAGNOSIS — E119 Type 2 diabetes mellitus without complications: Secondary | ICD-10-CM | POA: Insufficient documentation

## 2012-12-23 DIAGNOSIS — I10 Essential (primary) hypertension: Secondary | ICD-10-CM | POA: Insufficient documentation

## 2012-12-23 DIAGNOSIS — M79672 Pain in left foot: Secondary | ICD-10-CM

## 2012-12-23 DIAGNOSIS — Z862 Personal history of diseases of the blood and blood-forming organs and certain disorders involving the immune mechanism: Secondary | ICD-10-CM | POA: Insufficient documentation

## 2012-12-23 DIAGNOSIS — Z8739 Personal history of other diseases of the musculoskeletal system and connective tissue: Secondary | ICD-10-CM | POA: Insufficient documentation

## 2012-12-23 DIAGNOSIS — K219 Gastro-esophageal reflux disease without esophagitis: Secondary | ICD-10-CM | POA: Insufficient documentation

## 2012-12-23 DIAGNOSIS — M25579 Pain in unspecified ankle and joints of unspecified foot: Secondary | ICD-10-CM | POA: Insufficient documentation

## 2012-12-23 DIAGNOSIS — Z7982 Long term (current) use of aspirin: Secondary | ICD-10-CM | POA: Insufficient documentation

## 2012-12-23 DIAGNOSIS — Z8639 Personal history of other endocrine, nutritional and metabolic disease: Secondary | ICD-10-CM | POA: Insufficient documentation

## 2012-12-23 DIAGNOSIS — Z79899 Other long term (current) drug therapy: Secondary | ICD-10-CM | POA: Insufficient documentation

## 2012-12-23 MED ORDER — NAPROXEN 250 MG PO TABS
375.0000 mg | ORAL_TABLET | Freq: Once | ORAL | Status: AC
  Administered 2012-12-23: 375 mg via ORAL
  Filled 2012-12-23: qty 2

## 2012-12-23 MED ORDER — NAPROXEN 375 MG PO TABS: 375.0000 mg | ORAL_TABLET | Freq: Two times a day (BID) | ORAL | Status: DC

## 2012-12-23 MED ORDER — PREDNISONE 20 MG PO TABS
40.0000 mg | ORAL_TABLET | Freq: Once | ORAL | Status: AC
  Administered 2012-12-23: 40 mg via ORAL
  Filled 2012-12-23: qty 2

## 2012-12-23 MED ORDER — PREDNISONE 20 MG PO TABS: ORAL_TABLET | ORAL | Status: DC

## 2012-12-23 NOTE — Telephone Encounter (Signed)
Patient had called following receipt of a letter from [facility], the provider of the FMLA, under patient's employer [employer], as patient is concerned that Loletta Parish is awaiting further medical records information.  I have contacted Loletta Parish at ph# [phone] (fax # is [phone]), and have spoken with Janelle.  She has reviewed patient's claim, which indicates, as patient has been made aware, that the current FMLA leave is approved through 01/04/13.  This is also the date of patient's next scheduled appointment in this office.  Karolee Stamps has also spoken with the claims examiner for patient's FMLA, who has also reviewed all records, work notes, and forms which have been forwarded to them as requested, with the appropriate signed release of medical information.  Janelle relayed that at this time, no further medical records or notes are needed, as they already have received all current records.    At patient's next appointment, 01/04/13, we are to provide the medical records and work status note, as we have been faxing to their # [phone].  She states that if it is determined that patient is unable to return to work by 01/11/13 (current note indicates out of work through estimated date of 01/10/13),  then patient's FMLA leave will expire, and patient will be then applying for, and needing to be approved for, personal leave of absence.  I have reviewed this information to the patient, and have advised to contact [facility] and/or employer, [facility] directly, if any further questions or concerns.

## 2012-12-23 NOTE — ED Provider Notes (Signed)
History    This chart was scribed for Donnetta Hutching, MD by Sofie Rower, ED Scribe. The patient was seen in room APA11/APA11 and the patient's care was started at 8:15PM.    CSN: [id number]  Arrival date & time 12/23/12  2000   First MD Initiated Contact with Patient 12/23/12 2015      Chief Complaint  Patient presents with  . Foot Pain    (Consider location/radiation/quality/duration/timing/severity/associated sxs/prior treatment) The history is provided by the patient, the spouse and a relative. No language interpreter was used.    [name] is a 79 y.o. male , with a hx of diabetes mellitus, hypertension, GERD, and arthritis, who presents to the Emergency Department complaining of intermittent, progressively worsening, foot pain located at the left foot, onset yesterday (12/22/12). The pt reports he has been experiencing a sharp, shooting, painful sensation within his left foot, occuring every few minutes, for the past 24 hours, which has inhibited his ability to sleep. The progressively worsening foot pain has prompted the pt's concern and desire to seek medical evaluation at [facility] this evening (12/23/12). The pt is able to ambulate at present, however, he informs it is indeed painful.  The pt denies any injury or trauma to the left foot which may have given rise to his left foot pain at present.   The pt is a former smoker, however, she does not drink alcohol.   PCP is Dr. Renard Matter.    Past Medical History  Diagnosis Date  . Diabetes mellitus   . Hypertension   . Hyperchloremia   . GERD (gastroesophageal reflux disease)   . Arthritis     Past Surgical History  Procedure Laterality Date  . Tonsillectomy    . Knee arthroscopy    . Sholder arthroscopy with open rotator cuff repair Left 10/15/2012    Procedure: SHOULDER ARTHROSCOPY WITH OPEN ROTATOR CUFF REPAIR;  Surgeon: Vickki Hearing, MD;  Location: [facility];  Service: Orthopedics;  Laterality: Left;  open at 0912; with  bicep tendon debridement;   . Shoulder acromioplasty Left 10/15/2012    Procedure: SHOULDER ACROMIOPLASTY;  Surgeon: Vickki Hearing, MD;  Location: [facility];  Service: Orthopedics;  Laterality: Left;  . Resection distal clavical Left 10/15/2012    Procedure: RESECTION DISTAL CLAVICAL;  Surgeon: Vickki Hearing, MD;  Location: [facility];  Service: Orthopedics;  Laterality: Left;    Family History  Problem Relation Age of Onset  . Arthritis    . Cancer    . Diabetes      History  Substance Use Topics  . Smoking status: Former Smoker -- 3.00 packs/day for 30 years    Types: Cigarettes    Quit date: 10/11/1993  . Smokeless tobacco: Not on file  . Alcohol Use: No      Review of Systems  Musculoskeletal: Positive for arthralgias.  All other systems reviewed and are negative.    Allergies  Review of patient's allergies indicates no known allergies.  Home Medications   Current Outpatient Rx  Name  Route  Sig  Dispense  Refill  . aspirin 81 MG tablet   Oral   Take 81 mg by mouth daily.           . fish oil-omega-3 fatty acids 1000 MG capsule   Oral   Take 1 g by mouth 2 (two) times daily.          Marland Kitchen glipiZIDE (GLUCOTROL) 5 MG tablet   Oral   Take 5  mg by mouth 2 (two) times daily before a meal.           . HYDROcodone-acetaminophen (NORCO) 10-325 MG per tablet   Oral   Take 1 tablet by mouth every 4 (four) hours as needed for pain.   42 tablet   3   . ibuprofen (ADVIL,MOTRIN) 800 MG tablet   Oral   Take 1 tablet (800 mg total) by mouth every 8 (eight) hours as needed for pain.   90 tablet   0   . lisinopril (PRINIVIL,ZESTRIL) 40 MG tablet   Oral   Take 40 mg by mouth daily.           . metFORMIN (GLUCOPHAGE) 1000 MG tablet   Oral   Take 1,000 mg by mouth 2 (two) times daily with a meal.           . methocarbamol (ROBAXIN) 500 MG tablet   Oral   Take 1 tablet (500 mg total) by mouth 4 (four) times daily.   60 tablet   1   . NIASPAN 500 MG CR  tablet   Oral   Take 500 mg by mouth at bedtime.          . ONGLYZA 5 MG TABS tablet   Oral   Take 5 mg by mouth daily.          . promethazine (PHENERGAN) 12.5 MG tablet   Oral   Take 1 tablet (12.5 mg total) by mouth every 6 (six) hours as needed for nausea.   30 tablet   0   . ranitidine (ZANTAC) 150 MG capsule   Oral   Take 150 mg by mouth 2 (two) times daily.          . simvastatin (ZOCOR) 80 MG tablet   Oral   Take 80 mg by mouth at bedtime.             BP 173/89  Pulse 98  Temp(Src) 97.1 F (36.2 C) (Oral)  Resp 22  Ht 5\' 10"  (1.778 m)  Wt 190 lb (86.183 kg)  BMI 27.26 kg/m2  SpO2 98%  Physical Exam  Nursing note and vitals reviewed. Constitutional: He is oriented to person, place, and time. He appears well-developed and well-nourished.  HENT:  Head: Normocephalic and atraumatic.  Eyes: Conjunctivae and EOM are normal. Pupils are equal, round, and reactive to light.  Neck: Normal range of motion. Neck supple.  Cardiovascular: Normal rate, regular rhythm and normal heart sounds.   Pulmonary/Chest: Effort normal and breath sounds normal.  Abdominal: Soft. Bowel sounds are normal.  Musculoskeletal: Normal range of motion. He exhibits tenderness.       Left foot: He exhibits tenderness.  Tender within the proximal, dorsal, lateral aspect of the left foot. No swelling detected. Neurovascularly intact.   Neurological: He is alert and oriented to person, place, and time.  Skin: Skin is warm and dry.  Psychiatric: He has a normal mood and affect.    ED Course  Procedures (including critical care time)  DIAGNOSTIC STUDIES: Oxygen Saturation is 98% on room air, normal by my interpretation.    COORDINATION OF CARE:  8:29 PM- Treatment plan concerning radiologic evaluation of the left foot discussed with patient. Pt agrees with treatment.      Labs Reviewed - No data to display  Dg Foot Complete Left  12/23/2012   *RADIOLOGY REPORT*  Clinical  Data: Lateral left foot pain, no known injury  LEFT FOOT - COMPLETE 3+  VIEW  Comparison: 01/30/2006  Findings: No fracture or dislocation is seen.  The joint spaces are essentially preserved.  Small plantar and posterior calcaneal enthesophytes.  The visualized soft tissues are unremarkable.  IMPRESSION: No acute osseous abnormality is seen.   Original Report Authenticated By: [name], M.D.      No diagnosis found.    MDM  Probable nerve pain in left foot. Uncertain etiology. X-ray negative. Discharge home with prednisone and Naprosyn. Followup with orthopedics      I personally performed the services described in this documentation, which was scribed in my presence. The recorded information has been reviewed and is accurate.    Donnetta Hutching, MD 12/23/12 2217

## 2012-12-23 NOTE — ED Notes (Signed)
Patient complaining of left foot pain that started suddenly last night. Complaining of throbbing and sharp pains.

## 2012-12-23 NOTE — Progress Notes (Signed)
Occupational Therapy Treatment Patient Details  Name: [name] MRN: [id number] Date of Birth: 02/05/1930  Today's Date: 12/23/2012 Time: 1350-1430 OT Time Calculation (min): 40 min MFR 1350-1400 10' Therex 1400-1430 30'  Visit#: 7 of 12  Re-eval: 12/27/12    Authorization: [employer]  Authorization Time Period: before 10th visit  Authorization Visit#: 7 of 10  Subjective Symptoms/Limitations Symptoms: S: I did something to my foot. It jumps all over. I have been up since 2am.  Pain Assessment Currently in Pain?: No/denies  Precautions/Restrictions     Exercise/Treatments Supine Protraction: PROM;10 reps Horizontal ABduction: PROM;10 reps External Rotation: PROM;10 reps Internal Rotation: PROM;10 reps Flexion: PROM;10 reps ABduction: PROM;10 reps Other Supine Exercises: bridges 20 times  Seated Elevation: AROM;15 reps Extension: Theraband;10 reps Theraband Level (Shoulder Extension): Level 2 (Red) Retraction: Theraband;10 reps Theraband Level (Shoulder Retraction): Level 2 (Red) Row: Theraband;10 reps Theraband Level (Shoulder Row): Level 2 (Red) Therapy Ball Flexion: 20 reps ABduction: 20 reps ROM / Strengthening / Isometric Strengthening   Flexion: Supine;Other (comment) (3x10) Extension: Supine;Other (comment) (3x10) External Rotation: Supine (3x10) Internal Rotation: Supine (3x10) ABduction: Supine (3x10) ADduction: Supine;Other (comment) (3x10)    Manual Therapy Manual Therapy: Myofascial release Myofascial Release: MFR and manual stretching to left upper arm, scapularis and trapezius region to decrease fascial restrictions and increase joint mobility in a pain free zone within parameters of protocol  Occupational Therapy Assessment and Plan OT Assessment and Plan Clinical Impression Statement: A: Added serratus anterior punch. Tolerated well. Patient required only intial cues to begn exercises.  OT Plan: P: Cont. working on correct  form  with all exercises.    Goals Short Term Goals Short Term Goal 2:  Patient will increase PROM to [facility] for increased independence with donning and doffing jackets and shirts. Short Term Goal 3: Patient will increase his left shoulder strength to 3+/5 for increased ability to lift arm overhead into cabinets. Short Term Goal 4: Patient will decrease the pain level in his left shoulder to 4/10 with activity and when sleeping. Short Term Goal 5: Patient will decrease fascial restrictions from mod-max to moderate in his left shoulder region for greater mobility with B/IADLs.  Long Term Goals Long Term Goal 1: Patient will return to prior level of independence with all daily, leisure, and work activities. Long Term Goal 2:  Patient will increase AROM to [facility] for increased independence with donning and doffing jackets and shirts. Long Term Goal 3: Patient will increase his left shoulder strength to 4/5 for increased ability to lift arm overhead into cabinets. Long Term Goal 4: Patient will decrease the pain level in his left shoulder to 1/10 with activity and when sleeping. Long Term Goal 5: Patient will decrease fascial restrictions from moderate to trace in his left shoulder region for greater mobility with B/IADLs.   Problem List Patient Active Problem List   Diagnosis Date Noted  . Arthritis, shoulder region 10/15/2012  . Biceps tendonitis on left 10/15/2012  . Adhesive bursitis of left shoulder 09/01/2012  . Partial tear of rotator cuff 09/01/2012  . Pain in joint, shoulder region 07/09/2011  . Muscle weakness (generalized) 07/09/2011  . Bursitis of shoulder, left 07/03/2011    End of Session Activity Tolerance: Patient tolerated treatment well General Behavior During Therapy: [facility] for tasks assessed/performed Cognition: [facility] for tasks performed   Limmie Patricia, OTR/L,CBIS   12/23/2012, 2:32 PM

## 2012-12-23 NOTE — ED Notes (Signed)
Pt c/o left foot pain x1 day. Pt describes pain as sharp and shooting. Pt denies any injury.

## 2012-12-29 ENCOUNTER — Ambulatory Visit (HOSPITAL_COMMUNITY)
Admission: RE | Admit: 2012-12-29 | Discharge: 2012-12-29 | Disposition: A | Discharge status: Home / Self Care | Payer: Medicare Other | Source: Ambulatory Visit | Attending: Orthopedic Surgery | Admitting: Orthopedic Surgery

## 2012-12-29 NOTE — Evaluation (Signed)
Occupational Therapy Re-Evaluation  Patient Details  Name: [name] MRN: [id number] Date of Birth: 11/27/1929  Today's Date: 12/29/2012 Time: 1350-1430 OT Time Calculation (min): 40 min MFR 1350-1410 20' Reassess 4098-1191 12' Therex 4782-9562  9'  Visit#: 8 of 12  Re-eval: 01/26/13  Assessment Diagnosis: Lt shoulder bursitis  Authorization: [employer]  Authorization Time Period: before 18th visit  Authorization Visit#: 8 of 18   Past Medical History:  Past Medical History  Diagnosis Date  . Diabetes mellitus   . Hypertension   . Hyperchloremia   . GERD (gastroesophageal reflux disease)   . Arthritis    Past Surgical History:  Past Surgical History  Procedure Laterality Date  . Tonsillectomy    . Knee arthroscopy    . Sholder arthroscopy with open rotator cuff repair Left 10/15/2012    Procedure: SHOULDER ARTHROSCOPY WITH OPEN ROTATOR CUFF REPAIR;  Surgeon: Vickki Hearing, MD;  Location: [facility];  Service: Orthopedics;  Laterality: Left;  open at 0912; with bicep tendon debridement;   . Shoulder acromioplasty Left 10/15/2012    Procedure: SHOULDER ACROMIOPLASTY;  Surgeon: Vickki Hearing, MD;  Location: [facility];  Service: Orthopedics;  Laterality: Left;  . Resection distal clavical Left 10/15/2012    Procedure: RESECTION DISTAL CLAVICAL;  Surgeon: Vickki Hearing, MD;  Location: [facility];  Service: Orthopedics;  Laterality: Left;    Subjective Symptoms/Limitations Special Tests: DASH score of 25 with ideal score of 0. (On eval: 42.5) Pain Assessment Currently in Pain?: No/denies  Precautions/Restrictions  Precautions Precautions: Shoulder Type of Shoulder Precautions: Rotator Cuff Protocol   Assessment Additional Assessments LUE Assessment LUE Assessment:  (assessed supine. I) LUE AROM (degrees) Left Shoulder Flexion: 120 Degrees (on eval: 90) Left Shoulder ABduction: 110 Degrees (On eval: 79) Left Shoulder Internal Rotation: 80 Degrees (on  eval: 75) Left Shoulder External Rotation: 50 Degrees (on eval: 49) LUE PROM (degrees) Left Shoulder Flexion: 134 Degrees (on eval: 105) Left Shoulder ABduction: 112 Degrees (on eval: 82) Left Shoulder Internal Rotation: 83 Degrees (on eval: 82) Left Shoulder External Rotation: 56 Degrees (on eval: 51) LUE Strength Left Shoulder Flexion: 3/5 (on eval: 3-/5) Left Shoulder ABduction: 3/5 (on eval: 3-/5) Left Shoulder Internal Rotation: 3/5 Left Shoulder External Rotation: 3/5 Palpation Palpation: Mod fascial restrictions felt in bicep and trapezius region.     Exercise/Treatments Supine Protraction: PROM;10 reps Horizontal ABduction: PROM;10 reps External Rotation: PROM;10 reps Internal Rotation: PROM;10 reps Flexion: PROM;10 reps ABduction: PROM;10 reps Therapy Ball Flexion: 20 reps ABduction: 20 reps ROM / Strengthening / Isometric Strengthening Prot/Ret//Elev/Dep: 1'      Manual Therapy Manual Therapy: Myofascial release Myofascial Release: MFR and manual stretching to left upper arm, scapularis and trapezius region to decrease fascial restrictions and increase joint mobility in a pain free zone within parameters of protocol  Occupational Therapy Assessment and Plan OT Assessment and Plan Clinical Impression Statement: A: See MD note for progress. Pt has shown increased AROM/PROM measurements this date. Pt has no pain when sleeping. OT Plan: P: Cont. working on correct  form with all exercises. 6/3: begin AAROM>   Goals Short Term Goals Short Term Goal 1 Progress: Met Short Term Goal 2:  Patient will increase PROM to [facility] for increased independence with donning and doffing jackets and shirts. Short Term Goal 2 Progress: Met Short Term Goal 3: Patient will increase his left shoulder strength to 3+/5 for increased ability to lift arm overhead into cabinets. Short Term Goal 3 Progress: Met Short Term Goal  4: Patient will decrease the pain level in his left shoulder to 4/10  with activity and when sleeping. Short Term Goal 4 Progress: Met Short Term Goal 5: Patient will decrease fascial restrictions from mod-max to moderate in his left shoulder region for greater mobility with B/IADLs.  Short Term Goal 5 Progress: Met Long Term Goals Long Term Goal 1: Patient will return to prior level of independence with all daily, leisure, and work activities. Long Term Goal 1 Progress: Progressing toward goal Long Term Goal 2:  Patient will increase AROM to [facility] for increased independence with donning and doffing jackets and shirts. Long Term Goal 2 Progress: Progressing toward goal Long Term Goal 3: Patient will increase his left shoulder strength to 4/5 for increased ability to lift arm overhead into cabinets. Long Term Goal 3 Progress: Progressing toward goal Long Term Goal 4: Patient will decrease the pain level in his left shoulder to 1/10 with activity and when sleeping. Long Term Goal 4 Progress: Met Long Term Goal 5: Patient will decrease fascial restrictions from moderate to trace in his left shoulder region for greater mobility with B/IADLs.  Long Term Goal 5 Progress: Progressing toward goal  Problem List Patient Active Problem List   Diagnosis Date Noted  . Arthritis, shoulder region 10/15/2012  . Biceps tendonitis on left 10/15/2012  . Adhesive bursitis of left shoulder 09/01/2012  . Partial tear of rotator cuff 09/01/2012  . Pain in joint, shoulder region 07/09/2011  . Muscle weakness (generalized) 07/09/2011  . Bursitis of shoulder, left 07/03/2011    End of Session Activity Tolerance: Patient tolerated treatment well General Behavior During Therapy: [facility] for tasks assessed/performed Cognition: WFL for tasks performed  GO Functional Assessment Tool Used: DASH score of 25 with ideal score of 0. Functional Limitation: Carrying, moving and handling objects Carrying, Moving and Handling Objects Current Status [phone]): At least 20 percent but less than 40  percent impaired, limited or restricted Carrying, Moving and Handling Objects Goal Status [phone]): At least 1 percent but less than 20 percent impaired, limited or restricted  Limmie Patricia, OTR/L,CBIS   12/29/2012, 2:35 PM  Physician Documentation Your signature is required to indicate approval of the treatment plan as stated above.  Please sign and either send electronically or make a copy of this report for your files and return this physician signed original.  Please mark one 1.__approve of plan  2. ___approve of plan with the following conditions.   ______________________________                                                          _____________________ Physician Signature                                                                                                             Date

## 2012-12-31 ENCOUNTER — Ambulatory Visit (HOSPITAL_COMMUNITY)
Admission: RE | Admit: 2012-12-31 | Discharge: 2012-12-31 | Disposition: A | Discharge status: Home / Self Care | Payer: Medicare Other | Source: Ambulatory Visit | Attending: Orthopedic Surgery | Admitting: Orthopedic Surgery

## 2012-12-31 NOTE — Progress Notes (Signed)
Occupational Therapy Treatment Patient Details  Name: [name] MRN: [id number] Date of Birth: [birth year]  Today's Date: 12/31/2012 Time: 1150-1230 OT Time Calculation (min): 40 min MFR 4098-1191 12' Therex 4782-9562 28'   Visit#: 9 of 20  Re-eval: 01/26/13    Authorization: [employer]  Authorization Time Period: before 10th visit  Authorization Visit#: 8 of 18  Subjective Symptoms/Limitations Symptoms: S: I go to the doctor next week. Pain Assessment Currently in Pain?: No/denies  Precautions/Restrictions  Precautions Precautions: Shoulder Type of Shoulder Precautions: Rotator Cuff Protocol  Exercise/Treatments Supine Protraction: PROM;10 reps Horizontal ABduction: PROM;10 reps External Rotation: PROM;10 reps Internal Rotation: PROM;10 reps Flexion: PROM;10 reps ABduction: PROM;10 reps Other Supine Exercises: bridges 20 times  Seated Elevation: AROM;15 reps Extension: Theraband;12 reps Theraband Level (Shoulder Extension): Level 2 (Red) Retraction: Theraband;12 reps Theraband Level (Shoulder Retraction): Level 2 (Red) Row: Theraband;12 reps Theraband Level (Shoulder Row): Level 2 (Red) Protraction: Theraband;12 reps Therapy Ball Flexion: 20 reps ABduction: 20 reps ROM / Strengthening / Isometric Strengthening Thumb Tacks: 1' Proximal Shoulder Strengthening, Supine: 10 reps in supine without resting between activities, with min hand over hand facilitation by OTR/L Prot/Ret//Elev/Dep: 1' Flexion: Supine;Other (comment) (3x10) Extension: Supine;Other (comment) (3x10) External Rotation: Supine (3x10) Internal Rotation: Supine (3x10) ABduction: Supine (3x10) ADduction: Supine;Other (comment) (3x10)    Manual Therapy Manual Therapy: Myofascial release Myofascial Release: MFR and manual stretching to left upper arm, scapularis and trapezius region to decrease fascial restrictions and increase joint mobility in a pain free zone within parameters of  protocol  Occupational Therapy Assessment and Plan OT Assessment and Plan Clinical Impression Statement: A: Added thumb tacks and increased theraband reps. Pt tolerated well. Pt has MD appt on 6/3. OT Plan: P: Begin AAROM supine. Add pulleys.   Goals Short Term Goals Short Term Goal 2:  Patient will increase PROM to [facility] for increased independence with donning and doffing jackets and shirts. Short Term Goal 3: Patient will increase his left shoulder strength to 3+/5 for increased ability to lift arm overhead into cabinets. Short Term Goal 4: Patient will decrease the pain level in his left shoulder to 4/10 with activity and when sleeping. Short Term Goal 5: Patient will decrease fascial restrictions from mod-max to moderate in his left shoulder region for greater mobility with B/IADLs.  Long Term Goals Long Term Goal 1: Patient will return to prior level of independence with all daily, leisure, and work activities. Long Term Goal 1 Progress: Progressing toward goal Long Term Goal 2:  Patient will increase AROM to [facility] for increased independence with donning and doffing jackets and shirts. Long Term Goal 2 Progress: Progressing toward goal Long Term Goal 3: Patient will increase his left shoulder strength to 4/5 for increased ability to lift arm overhead into cabinets. Long Term Goal 4: Patient will decrease the pain level in his left shoulder to 1/10 with activity and when sleeping. Long Term Goal 5: Patient will decrease fascial restrictions from moderate to trace in his left shoulder region for greater mobility with B/IADLs.  Long Term Goal 5 Progress: Progressing toward goal  Problem List Patient Active Problem List   Diagnosis Date Noted  . Arthritis, shoulder region 10/15/2012  . Biceps tendonitis on left 10/15/2012  . Adhesive bursitis of left shoulder 09/01/2012  . Partial tear of rotator cuff 09/01/2012  . Pain in joint, shoulder region 07/09/2011  . Muscle weakness (generalized)  07/09/2011  . Bursitis of shoulder, left 07/03/2011    End of Session Activity Tolerance:  Patient tolerated treatment well General Behavior During Therapy: [facility] for tasks assessed/performed Cognition: WFL for tasks performed   Limmie Patricia, OTR/L,CBIS   12/31/2012, 12:35 PM

## 2013-01-03 ENCOUNTER — Ambulatory Visit (HOSPITAL_COMMUNITY)
Admission: RE | Admit: 2013-01-03 | Discharge: 2013-01-03 | Disposition: A | Discharge status: Home / Self Care | Payer: Medicare Other | Source: Ambulatory Visit | Attending: Orthopedic Surgery | Admitting: Orthopedic Surgery

## 2013-01-03 DIAGNOSIS — I10 Essential (primary) hypertension: Secondary | ICD-10-CM | POA: Insufficient documentation

## 2013-01-03 DIAGNOSIS — M25519 Pain in unspecified shoulder: Secondary | ICD-10-CM | POA: Insufficient documentation

## 2013-01-03 DIAGNOSIS — E119 Type 2 diabetes mellitus without complications: Secondary | ICD-10-CM | POA: Insufficient documentation

## 2013-01-03 DIAGNOSIS — IMO0001 Reserved for inherently not codable concepts without codable children: Secondary | ICD-10-CM | POA: Insufficient documentation

## 2013-01-03 NOTE — Progress Notes (Signed)
Occupational Therapy Treatment Patient Details  Name: Willoughby Doell MRN: [id number] Date of Birth: Dec 14, 1929  Today's Date: 01/03/2013 Time: 4098-1191 OT Time Calculation (min): 41 min MFR 4782-9562 13' Therex 1308-6578 28  Visit#: 10 of 20  Re-eval: 01/26/13    Authorization: [employer]  Authorization Time Period: before 10th visit  Authorization Visit#: 10 of 18  Subjective Symptoms/Limitations Symptoms: S: I did a lot of sweeping in the buildings this weekend. My Dr appt is tomorrow.  Pain Assessment Currently in Pain?: No/denies  Precautions/Restrictions  Precautions Precautions: Shoulder Type of Shoulder Precautions: Rotator Cuff Protocol  Exercise/Treatments Supine Protraction: PROM;AAROM;10 reps Horizontal ABduction: PROM;AAROM;10 reps External Rotation: PROM;AAROM;10 reps Internal Rotation: PROM;AAROM;10 reps Flexion: PROM;AAROM;10 reps ABduction: PROM;AAROM;10 reps Other Supine Exercises: d/c Seated Protraction: AAROM;10 reps Horizontal ABduction: AAROM;10 reps External Rotation: AAROM;10 reps Internal Rotation: AAROM;10 reps Flexion: AAROM;10 reps Abduction: AAROM;10 reps Pulleys Flexion: 1 minute ABduction: 1 minute ROM / Strengthening / Isometric Strengthening Thumb Tacks: 1' Flexion:  (d/c) Extension:  (d/c) External Rotation:  (d/c) Internal Rotation:  (d/c) ABduction:  (d/c) ADduction:  (d/c)       Manual Therapy Manual Therapy: Myofascial release Myofascial Release: MFR and manual stretching to left upper arm, scapularis and trapezius region to decrease fascial restrictions and increase joint mobility in a pain free zone within parameters of protocol  Occupational Therapy Assessment and Plan OT Assessment and Plan Clinical Impression Statement: A: Added AAROM supine and seated. Required min physical and verbal guidance for form and technique. Added pulleys. Tolerated well  with min soreness.  OT Plan: P: Increase AAROM reps if able  to tolerate.   Goals Short Term Goals Short Term Goal 2:  Patient will increase PROM to [facility] for increased independence with donning and doffing jackets and shirts. Short Term Goal 3: Patient will increase his left shoulder strength to 3+/5 for increased ability to lift arm overhead into cabinets. Short Term Goal 4: Patient will decrease the pain level in his left shoulder to 4/10 with activity and when sleeping. Short Term Goal 5: Patient will decrease fascial restrictions from mod-max to moderate in his left shoulder region for greater mobility with B/IADLs.  Long Term Goals Long Term Goal 1: Patient will return to prior level of independence with all daily, leisure, and work activities. Long Term Goal 1 Progress: Progressing toward goal Long Term Goal 2:  Patient will increase AROM to [facility] for increased independence with donning and doffing jackets and shirts. Long Term Goal 2 Progress: Progressing toward goal Long Term Goal 3: Patient will increase his left shoulder strength to 4/5 for increased ability to lift arm overhead into cabinets. Long Term Goal 3 Progress: Progressing toward goal Long Term Goal 4: Patient will decrease the pain level in his left shoulder to 1/10 with activity and when sleeping. Long Term Goal 5: Patient will decrease fascial restrictions from moderate to trace in his left shoulder region for greater mobility with B/IADLs.  Long Term Goal 5 Progress: Progressing toward goal  Problem List Patient Active Problem List   Diagnosis Date Noted  . Arthritis, shoulder region 10/15/2012  . Biceps tendonitis on left 10/15/2012  . Adhesive bursitis of left shoulder 09/01/2012  . Partial tear of rotator cuff 09/01/2012  . Pain in joint, shoulder region 07/09/2011  . Muscle weakness (generalized) 07/09/2011  . Bursitis of shoulder, left 07/03/2011    End of Session Activity Tolerance: Patient tolerated treatment well General Behavior During Therapy: [facility] for tasks  assessed/performed Cognition: [facility]  for tasks performed   Limmie Patricia, OTR/L,CBIS   01/03/2013, 12:15 PM

## 2013-01-04 ENCOUNTER — Ambulatory Visit (INDEPENDENT_AMBULATORY_CARE_PROVIDER_SITE_OTHER): Payer: Medicare Other | Admitting: Orthopedic Surgery

## 2013-01-04 ENCOUNTER — Encounter: Payer: Self-pay | Admitting: Orthopedic Surgery

## 2013-01-04 VITALS — BP 112/60 | Ht 70.0 in | Wt 187.0 lb

## 2013-01-04 DIAGNOSIS — Z9889 Other specified postprocedural states: Secondary | ICD-10-CM

## 2013-01-04 DIAGNOSIS — M75102 Unspecified rotator cuff tear or rupture of left shoulder, not specified as traumatic: Secondary | ICD-10-CM

## 2013-01-04 DIAGNOSIS — S43429A Sprain of unspecified rotator cuff capsule, initial encounter: Secondary | ICD-10-CM

## 2013-01-04 DIAGNOSIS — M75100 Unspecified rotator cuff tear or rupture of unspecified shoulder, not specified as traumatic: Secondary | ICD-10-CM | POA: Insufficient documentation

## 2013-01-04 NOTE — Progress Notes (Signed)
Patient ID: [name], male   DOB: [birth year], 79 y.o.   MRN: [id number] Chief Complaint  Patient presents with  . Follow-up    6 week recheck left RC post op DOS 10/15/12    Left shoulder    The incision is clean, healed well. Passive range of motion 110 of forward flexion.  Recommend continue therapy to work on flexion and strength  Follow up in 8 weeks continue to work status

## 2013-01-04 NOTE — Patient Instructions (Addendum)
8 weeks OOW  Continue therapy

## 2013-01-06 ENCOUNTER — Ambulatory Visit (HOSPITAL_COMMUNITY)
Admission: RE | Admit: 2013-01-06 | Discharge: 2013-01-06 | Disposition: A | Discharge status: Home / Self Care | Payer: Medicare Other | Source: Ambulatory Visit | Attending: Orthopedic Surgery | Admitting: Orthopedic Surgery

## 2013-01-06 NOTE — Progress Notes (Signed)
Occupational Therapy Treatment Patient Details  Name: [name] MRN: [id number] Date of Birth: 1930/06/17  Today's Date: 01/06/2013 Time: 4098-1191 OT Time Calculation (min): 44 min Manual Therapy 4782-9562 20' Therapeutic exercises 1205-1229 24' Visit#: 11 of 20  Re-eval: 01/26/13    Authorization: [employer]  Authorization Time Period: before 18th visit  Authorization Visit#: 11 of 18  Subjective Symptoms/Limitations Symptoms: S:  I went to the MD and he gave me 8 more weeks off of therapy.  Limitations: Dr. Mort Sawyers protocol:  First 3 weeks of therapy through 5/19:  PROM only of shoulder, AROM of elbow-hand.  5/20-6/2:  continue PROM, scapular stability (ext, row strengthening).  6/3 and beyond, begin AAROM and progress to AROM as tolerated.   Pain Assessment Currently in Pain?: No/denies Pain Score: 0-No pain  Precautions/Restrictions     6/3 and beyond, begin AAROM and progress to AROM as tolerated.     Exercise/Treatments Supine Protraction: PROM;10 reps;AAROM;15 reps Horizontal ABduction: PROM;10 reps;AAROM;15 reps External Rotation: PROM;10 reps;AAROM;15 reps Internal Rotation: PROM;10 reps;AAROM;15 reps Flexion: PROM;10 reps;AAROM;15 reps ABduction: PROM;10 reps;AAROM;15 reps Other Supine Exercises: serratus anterior punch 10 tiems Seated Protraction: AAROM;10 reps Horizontal ABduction: AAROM;10 reps External Rotation: AAROM;10 reps Internal Rotation: AAROM;10 reps Flexion: AAROM;10 reps Abduction: AAROM;10 reps Pulleys Flexion: 2 minutes ABduction: 2 minutes Therapy Ball Flexion: 20 reps ABduction: 20 reps ROM / Strengthening / Isometric Strengthening Thumb Tacks: resume next visit Proximal Shoulder Strengthening, Supine: 10 reps in supine without resting between activities, with min hand over hand facilitation by OTR/L Prot/Ret//Elev/Dep: resume next visit  Rhythmic Stabilization, Supine: 30" with shoulder flexed to 90 and 30" for ER/IR       Manual Therapy Manual Therapy: Myofascial release Myofascial Release: MFR and manual stretching to left upper arm, scapularis and trapezius region to decrease fascial restrictions and increase joint mobility in a pain free zone within parameters of protocol  Occupational Therapy Assessment and Plan OT Assessment and Plan Clinical Impression Statement: A:  added proximal shoudler strengthening and rhythmic stabilization in supine for increased scapular stability.   OT Plan: P:  Increase ability to reach overhead and out to side during functional activities.  Increase to I with proximal shoulder strengthening in supine.    Goals Short Term Goals Short Term Goal 2:  Patient will increase PROM to [facility] for increased independence with donning and doffing jackets and shirts. Short Term Goal 3: Patient will increase his left shoulder strength to 3+/5 for increased ability to lift arm overhead into cabinets. Short Term Goal 4: Patient will decrease the pain level in his left shoulder to 4/10 with activity and when sleeping. Short Term Goal 5: Patient will decrease fascial restrictions from mod-max to moderate in his left shoulder region for greater mobility with B/IADLs.  Long Term Goals Long Term Goal 1: Patient will return to prior level of independence with all daily, leisure, and work activities. Long Term Goal 2:  Patient will increase AROM to [facility] for increased independence with donning and doffing jackets and shirts. Long Term Goal 3: Patient will increase his left shoulder strength to 4/5 for increased ability to lift arm overhead into cabinets. Long Term Goal 4: Patient will decrease the pain level in his left shoulder to 1/10 with activity and when sleeping. Long Term Goal 5: Patient will decrease fascial restrictions from moderate to trace in his left shoulder region for greater mobility with B/IADLs.   Problem List Patient Active Problem List   Diagnosis Date Noted  . Rotator  cuff tear  01/04/2013  . S/P rotator cuff repair 01/04/2013  . Arthritis, shoulder region 10/15/2012  . Biceps tendonitis on left 10/15/2012  . Adhesive bursitis of left shoulder 09/01/2012  . Partial tear of rotator cuff 09/01/2012  . Pain in joint, shoulder region 07/09/2011  . Muscle weakness (generalized) 07/09/2011  . Bursitis of shoulder, left 07/03/2011    End of Session Activity Tolerance: Patient tolerated treatment well General Behavior During Therapy: [facility] for tasks assessed/performed Cognition: WFL for tasks performed OT Plan of Care OT Home Exercise Plan: dowel rod exercises in seated or supine (instruction sheet given and scanned)  Shirlean Mylar, OTR/L  01/06/2013, 12:23 PM

## 2013-01-11 ENCOUNTER — Ambulatory Visit (HOSPITAL_COMMUNITY)
Admission: RE | Admit: 2013-01-11 | Discharge: 2013-01-11 | Disposition: A | Discharge status: Home / Self Care | Payer: Medicare Other | Source: Ambulatory Visit | Attending: Orthopedic Surgery | Admitting: Orthopedic Surgery

## 2013-01-11 NOTE — Progress Notes (Signed)
Occupational Therapy Treatment Patient Details  Name: [name] MRN: [id number] Date of Birth: 1930/05/21  Today's Date: 01/11/2013 Time: 6962-9528 OT Time Calculation (min): 40 min Manual Therapy 1435-1450 15' Therapeutic Exercises [phone] 25' Visit#: 12 of 24  Re-eval: 01/26/13    Authorization: [employer]  Authorization Time Period: before 18th visit  Authorization Visit#: 12 of 18  Subjective Symptoms/Limitations Symptoms: S:  Everything is fine. Limitations: Dr. Mort Sawyers protocol:  First 3 weeks of therapy through 5/19:  PROM only of shoulder, AROM of elbow-hand.  5/20-6/2:  continue PROM, scapular stability (ext, row strengthening).  6/3 and beyond, begin AAROM and progress to AROM as tolerated.   Pain Assessment Currently in Pain?: No/denies Pain Score: 0-No pain  Precautions/Restrictions    First 3 weeks of therapy through 5/19:  PROM only of shoulder, AROM of elbow-hand.  5/20-6/2:  continue PROM, scapular stability (ext, row strengthening).  6/3 and beyond, begin AAROM and progress to AROM as tolerated.     Exercise/Treatments Supine Protraction: PROM;10 reps;AAROM;15 reps Horizontal ABduction: PROM;10 reps;AAROM;15 reps External Rotation: PROM;10 reps;AAROM;15 reps Internal Rotation: PROM;10 reps;AAROM;15 reps Flexion: PROM;10 reps;AAROM;15 reps ABduction: PROM;10 reps;AAROM;15 reps Other Supine Exercises: serratus anterior punch 10 tiems Seated Extension: Theraband;15 reps Theraband Level (Shoulder Extension): Level 2 (Red) Row: Theraband;15 reps Theraband Level (Shoulder Row): Level 2 (Red) Protraction: AAROM;12 reps Horizontal ABduction: AAROM;12 reps External Rotation: AAROM;12 reps Internal Rotation: AAROM;12 reps Flexion: AAROM;12 reps Abduction: AAROM;12 reps ROM / Strengthening / Isometric Strengthening Thumb Tacks: 1' Proximal Shoulder Strengthening, Supine: 10 reps in supine without resting between activities, with min hand over hand  facilitation by OTR/L Prot/Ret//Elev/Dep: 1' with max vg for technique Rhythmic Stabilization, Supine: 30" with shoulder flexed to 90 and 30" for ER/IR      Manual Therapy Manual Therapy: Myofascial release Myofascial Release: MFR and manual stretching to left upper arm, scapularis and trapezius region to decrease fascial restrictions and increase joint mobility in a pain free zone within parameters of protocol  Occupational Therapy Assessment and Plan OT Assessment and Plan Clinical Impression Statement: A:  Increased AAROM in supine and seated this date.  Required min facilitation with proximal shoulder strengthening in supine.  OT Plan: P:  Increase to [facility] AAROM in supine.  Resume pulleys and ball stretches, attempt ball circles.    Goals Short Term Goals Short Term Goal 2:  Patient will increase PROM to [facility] for increased independence with donning and doffing jackets and shirts. Short Term Goal 3: Patient will increase his left shoulder strength to 3+/5 for increased ability to lift arm overhead into cabinets. Short Term Goal 4: Patient will decrease the pain level in his left shoulder to 4/10 with activity and when sleeping. Short Term Goal 5: Patient will decrease fascial restrictions from mod-max to moderate in his left shoulder region for greater mobility with B/IADLs.  Long Term Goals Long Term Goal 1: Patient will return to prior level of independence with all daily, leisure, and work activities. Long Term Goal 1 Progress: Progressing toward goal Long Term Goal 2:  Patient will increase AROM to [facility] for increased independence with donning and doffing jackets and shirts. Long Term Goal 2 Progress: Progressing toward goal Long Term Goal 3: Patient will increase his left shoulder strength to 4/5 for increased ability to lift arm overhead into cabinets. Long Term Goal 3 Progress: Progressing toward goal Long Term Goal 4: Patient will decrease the pain level in his left shoulder to 1/10  with activity and when sleeping.  Long Term Goal 5: Patient will decrease fascial restrictions from moderate to trace in his left shoulder region for greater mobility with B/IADLs.  Long Term Goal 5 Progress: Progressing toward goal  Problem List Patient Active Problem List   Diagnosis Date Noted  . Rotator cuff tear 01/04/2013  . S/P rotator cuff repair 01/04/2013  . Arthritis, shoulder region 10/15/2012  . Biceps tendonitis on left 10/15/2012  . Adhesive bursitis of left shoulder 09/01/2012  . Partial tear of rotator cuff 09/01/2012  . Pain in joint, shoulder region 07/09/2011  . Muscle weakness (generalized) 07/09/2011  . Bursitis of shoulder, left 07/03/2011    End of Session Activity Tolerance: Patient tolerated treatment well General Behavior During Therapy: [facility] for tasks assessed/performed Cognition: WFL for tasks performed  GO    Shirlean Mylar, OTR/L  01/11/2013, 3:20 PM

## 2013-01-14 ENCOUNTER — Ambulatory Visit (HOSPITAL_COMMUNITY)
Admission: RE | Admit: 2013-01-14 | Discharge: 2013-01-14 | Disposition: A | Discharge status: Home / Self Care | Payer: Medicare Other | Source: Ambulatory Visit | Attending: Orthopedic Surgery | Admitting: Orthopedic Surgery

## 2013-01-14 NOTE — Progress Notes (Signed)
Occupational Therapy Treatment Patient Details  Name: [name] MRN: [id number] Date of Birth: 1929-10-29  Today's Date: 01/14/2013 Time: 7829-5621 OT Time Calculation (min): 42 min MFR 3086-5784 13' Therex 6962-9528 29'  Visit#: 13 of 24  Re-eval: 01/26/13    Authorization: [employer]  Authorization Time Period: before 18th visit  Authorization Visit#: 13 of 18  Subjective Symptoms/Limitations Symptoms: S: I did my exercises this morning. I have a pulley in the barn with a rope and the doctor said I could use that to stretch it.  Pain Assessment Currently in Pain?: No/denies  Precautions/Restrictions  Precautions Precautions: Shoulder Type of Shoulder Precautions: Rotator Cuff Protocol  Exercise/Treatments Supine Protraction: PROM;10 reps;AAROM;15 reps Horizontal ABduction: PROM;10 reps;AAROM;15 reps External Rotation: PROM;10 reps;AAROM;15 reps Internal Rotation: PROM;10 reps;AAROM;15 reps Flexion: PROM;10 reps;AAROM;15 reps ABduction: PROM;10 reps;AAROM;15 reps Other Supine Exercises: serratus anterior punch 10 tiems Seated Protraction: AAROM;12 reps Horizontal ABduction: AAROM;12 reps External Rotation: AAROM;12 reps Internal Rotation: AAROM;12 reps Flexion: AAROM;12 reps Abduction: AAROM;12 reps Therapy Ball Right/Left: 5 reps ROM / Strengthening / Isometric Strengthening Thumb Tacks: 1' Proximal Shoulder Strengthening, Supine: 10 reps in supine without resting between activities, with min hand over hand facilitation by OTR/L      Manual Therapy Manual Therapy: Myofascial release Myofascial Release: MFR and manual stretching to left upper arm, scapularis and trapezius region to decrease fascial restrictions and increase joint mobility in a pain free zone within parameters of protocol  Occupational Therapy Assessment and Plan OT Assessment and Plan Clinical Impression Statement: A: Added ball circles. Patient tolerated well. Still have ROM  limitations during manual stretching and AAROM exercises.  OT Plan: P: Per protocol, add theraband.   Goals Short Term Goals Short Term Goal 2:  Patient will increase PROM to [facility] for increased independence with donning and doffing jackets and shirts. Short Term Goal 3: Patient will increase his left shoulder strength to 3+/5 for increased ability to lift arm overhead into cabinets. Short Term Goal 4: Patient will decrease the pain level in his left shoulder to 4/10 with activity and when sleeping. Short Term Goal 5: Patient will decrease fascial restrictions from mod-max to moderate in his left shoulder region for greater mobility with B/IADLs.  Long Term Goals Long Term Goal 1: Patient will return to prior level of independence with all daily, leisure, and work activities. Long Term Goal 2:  Patient will increase AROM to [facility] for increased independence with donning and doffing jackets and shirts. Long Term Goal 3: Patient will increase his left shoulder strength to 4/5 for increased ability to lift arm overhead into cabinets. Long Term Goal 4: Patient will decrease the pain level in his left shoulder to 1/10 with activity and when sleeping. Long Term Goal 5: Patient will decrease fascial restrictions from moderate to trace in his left shoulder region for greater mobility with B/IADLs.   Problem List Patient Active Problem List   Diagnosis Date Noted  . Rotator cuff tear 01/04/2013  . S/P rotator cuff repair 01/04/2013  . Arthritis, shoulder region 10/15/2012  . Biceps tendonitis on left 10/15/2012  . Adhesive bursitis of left shoulder 09/01/2012  . Partial tear of rotator cuff 09/01/2012  . Pain in joint, shoulder region 07/09/2011  . Muscle weakness (generalized) 07/09/2011  . Bursitis of shoulder, left 07/03/2011    End of Session Activity Tolerance: Patient tolerated treatment well General Behavior During Therapy: [facility] for tasks assessed/performed Cognition: [facility] for tasks  performed   Limmie Patricia, OTR/L,CBIS   01/14/2013,  1:47 PM

## 2013-01-18 ENCOUNTER — Ambulatory Visit (HOSPITAL_COMMUNITY)
Admission: RE | Admit: 2013-01-18 | Discharge: 2013-01-18 | Disposition: A | Discharge status: Home / Self Care | Payer: Medicare Other | Source: Ambulatory Visit | Attending: Orthopedic Surgery | Admitting: Orthopedic Surgery

## 2013-01-18 DIAGNOSIS — M6281 Muscle weakness (generalized): Secondary | ICD-10-CM | POA: Insufficient documentation

## 2013-01-18 DIAGNOSIS — IMO0001 Reserved for inherently not codable concepts without codable children: Secondary | ICD-10-CM | POA: Insufficient documentation

## 2013-01-18 DIAGNOSIS — M25519 Pain in unspecified shoulder: Secondary | ICD-10-CM | POA: Insufficient documentation

## 2013-01-18 NOTE — Progress Notes (Signed)
Occupational Therapy Treatment Patient Details  Name: [name] MRN: [id number] Date of Birth: Jul 07, 1930  Today's Date: 01/18/2013 Time: 5784-6962 OT Time Calculation (min): 37 min Manual Therapy 1045-1100 15' Therapeutic exercises 1100-1122 22' Visit#: 14 of 24  Re-eval: 01/26/13    Authorization: [employer]  Authorization Time Period: before 18th visit  Authorization Visit#: 14 of 18  Subjective S:  I use my arm alot, I just dont do anything that involves lifting anything heavy. Pertinent History: Surgery on 10/15/12 was left rotator cuff repair.   Limitations: Dr. Mort Sawyers protocol:  First 3 weeks of therapy through 5/19:  PROM only of shoulder, AROM of elbow-hand.  5/20-6/2:  continue PROM, scapular stability (ext, row strengthening).  6/3 and beyond, begin AAROM and progress to AROM as tolerated.   Pain Assessment Currently in Pain?: No/denies  Precautions/Restrictions     6/3 and beyond, begin AAROM and progress to AROM as tolerated.     Exercise/Treatments Supine Protraction: PROM;AROM;10 reps Horizontal ABduction: PROM;AROM;10 reps External Rotation: PROM;AROM;10 reps Internal Rotation: PROM;AROM;10 reps Flexion: PROM;AROM;10 reps ABduction: PROM;AROM;10 reps Seated Protraction: AROM;10 reps Horizontal ABduction: AROM;10 reps External Rotation: AROM;10 reps Internal Rotation: AROM;10 reps Flexion: AROM;10 reps Abduction: AROM;10 reps Standing External Rotation: Theraband;15 reps Theraband Level (Shoulder External Rotation): Level 2 (Red) Internal Rotation: Theraband;15 reps Theraband Level (Shoulder Internal Rotation): Level 2 (Red) Extension: Theraband;15 reps Theraband Level (Shoulder Extension): Level 2 (Red) Row: Theraband;15 reps Theraband Level (Shoulder Row): Level 2 (Red) Retraction: Theraband;15 reps Theraband Level (Shoulder Retraction): Level 2 (Red) Therapy Ball Right/Left: 5 reps ROM / Strengthening / Isometric Strengthening UBE  (Upper Arm Bike): 2' forward and 2' reverse 1/0 resistance Wall Wash: 1 1/2 minutes X to V Arms: 5X Proximal Shoulder Strengthening, Seated: 10 X in seated position without resting between exercises       Manual Therapy Manual Therapy: Myofascial release Myofascial Release: Myofascial release and manual stretching to left upper arm, scapularis and trapezius region to decrease fascial restrictions and increase joint mobility in a pain free zone within parameters of protocol  Occupational Therapy Assessment and Plan OT Assessment and Plan Clinical Impression Statement: A: Patient demonstrated WFL AAROM in supine and seated, therefore transitioned to AROM in supine and seated.  AROM of LUE is greater than RUE, however fatigues quickly. OT Plan: P:  Increase AROM reps, add w arm and increase x to v reps needed for greater proximal shoulder stability needed for greater independence with B/IADLs.    Goals Short Term Goals Short Term Goal 2:  Patient will increase PROM to [facility] for increased independence with donning and doffing jackets and shirts. Short Term Goal 3: Patient will increase his left shoulder strength to 3+/5 for increased ability to lift arm overhead into cabinets. Short Term Goal 4: Patient will decrease the pain level in his left shoulder to 4/10 with activity and when sleeping. Short Term Goal 5: Patient will decrease fascial restrictions from mod-max to moderate in his left shoulder region for greater mobility with B/IADLs.  Long Term Goals Long Term Goal 1: Patient will return to prior level of independence with all daily, leisure, and work activities. Long Term Goal 1 Progress: Progressing toward goal Long Term Goal 2:  Patient will increase AROM to [facility] for increased independence with donning and doffing jackets and shirts. Long Term Goal 2 Progress: Progressing toward goal Long Term Goal 3: Patient will increase his left shoulder strength to 4/5 for increased ability to lift  arm overhead into cabinets. Long  Term Goal 3 Progress: Progressing toward goal Long Term Goal 4: Patient will decrease the pain level in his left shoulder to 1/10 with activity and when sleeping. Long Term Goal 5: Patient will decrease fascial restrictions from moderate to trace in his left shoulder region for greater mobility with B/IADLs.  Long Term Goal 5 Progress: Progressing toward goal  Problem List Patient Active Problem List   Diagnosis Date Noted  . Rotator cuff tear 01/04/2013  . S/P rotator cuff repair 01/04/2013  . Arthritis, shoulder region 10/15/2012  . Biceps tendonitis on left 10/15/2012  . Adhesive bursitis of left shoulder 09/01/2012  . Partial tear of rotator cuff 09/01/2012  . Pain in joint, shoulder region 07/09/2011  . Muscle weakness (generalized) 07/09/2011  . Bursitis of shoulder, left 07/03/2011    End of Session Activity Tolerance: Patient tolerated treatment well General Behavior During Therapy: [facility] for tasks assessed/performed Cognition: WFL for tasks performed  GO    Shirlean Mylar, OTR/L  01/18/2013, 12:09 PM

## 2013-01-20 ENCOUNTER — Ambulatory Visit (HOSPITAL_COMMUNITY)
Admission: RE | Admit: 2013-01-20 | Discharge: 2013-01-20 | Disposition: A | Discharge status: Home / Self Care | Payer: Medicare Other | Source: Ambulatory Visit | Attending: Orthopedic Surgery | Admitting: Orthopedic Surgery

## 2013-01-20 NOTE — Progress Notes (Signed)
Occupational Therapy Treatment Patient Details  Name: [name] MRN: [id number] Date of Birth: November 02, 1929  Today's Date: 01/20/2013 Time: 4098-1191 OT Time Calculation (min): 47 min Manual Therapy 4782-9562 19' Therapeutic exercises 1325-1353 28' Visit#: 15 of 24  Re-eval: 01/26/13    Authorization: [employer] Medicare  Authorization Time Period: before 18th visit  Authorization Visit#: 15 of 18  Subjective  S:  Im anxious to get back to work.  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 Limitations: Dr. Mort Sawyers protocol:  First 3 weeks of therapy through 5/19:  PROM only of shoulder, AROM of elbow-hand.  5/20-6/2:  continue PROM, scapular stability (ext, row strengthening).  6/3 and beyond, begin AAROM and progress to AROM as tolerated.   Pain Assessment Currently in Pain?: No/denies Pain Score: 0-No pain  Precautions/Restrictions     6/3 and beyond, begin AAROM and progress to AROM as tolerated.     Exercise/Treatments Supine Protraction: PROM;10 reps;AROM;12 reps Horizontal ABduction: PROM;10 reps;AROM;12 reps External Rotation: PROM;10 reps;AROM;12 reps Internal Rotation: PROM;10 reps;AROM;12 reps Flexion: PROM;10 reps;AROM;12 reps ABduction: PROM;10 reps;AROM;12 reps Seated Protraction: AROM;12 reps Horizontal ABduction: AROM;12 reps External Rotation: AROM;12 reps Internal Rotation: AROM;12 reps Flexion: AROM;12 reps Abduction: AROM;12 reps Therapy Ball Right/Left: 5 reps ROM / Strengthening / Isometric Strengthening UBE (Upper Arm Bike): 3' forward and 3' in reverse 1.5 resistance Wall Wash: 2 minutes "W" Arms: 10X X to V Arms: 10X Proximal Shoulder Strengthening, Seated: 12X each without resting between exercises  Ball on Wall: 1 minute with shoudler flexed to 90 and 1 min with shoulder abducted to 90       Manual Therapy Manual Therapy: Myofascial release Myofascial Release: Myofascial release and manual stretching to left upper  arm, scapularis and trapezius region to decrease fascial restrictions and increase joint mobility in a pain free zone within parameters of protocol  Occupational Therapy Assessment and Plan OT Assessment and Plan Clinical Impression Statement: A:  Increased independence with x to v arms and added w arms.  Increased to 12 reps with supine and seated AROM. OT Plan: P:  Add 1# resistance to supine exercises, reassess.   Goals Short Term Goals Short Term Goal 2:  Patient will increase PROM to [facility] for increased independence with donning and doffing jackets and shirts. Short Term Goal 3: Patient will increase his left shoulder strength to 3+/5 for increased ability to lift arm overhead into cabinets. Short Term Goal 4: Patient will decrease the pain level in his left shoulder to 4/10 with activity and when sleeping. Short Term Goal 5: Patient will decrease fascial restrictions from mod-max to moderate in his left shoulder region for greater mobility with B/IADLs.  Long Term Goals Long Term Goal 1: Patient will return to prior level of independence with all daily, leisure, and work activities. Long Term Goal 2:  Patient will increase AROM to [facility] for increased independence with donning and doffing jackets and shirts. Long Term Goal 3: Patient will increase his left shoulder strength to 4/5 for increased ability to lift arm overhead into cabinets. Long Term Goal 4: Patient will decrease the pain level in his left shoulder to 1/10 with activity and when sleeping. Long Term Goal 5: Patient will decrease fascial restrictions from moderate to trace in his left shoulder region for greater mobility with B/IADLs.   Problem List Patient Active Problem List   Diagnosis Date Noted  . Rotator cuff tear 01/04/2013  . S/P rotator cuff repair  01/04/2013  . Arthritis, shoulder region 10/15/2012  . Biceps tendonitis on left 10/15/2012  . Adhesive bursitis of left shoulder 09/01/2012  . Partial tear of rotator  cuff 09/01/2012  . Pain in joint, shoulder region 07/09/2011  . Muscle weakness (generalized) 07/09/2011  . Bursitis of shoulder, left 07/03/2011    End of Session Activity Tolerance: Patient tolerated treatment well General Behavior During Therapy: [facility] for tasks assessed/performed Cognition: WFL for tasks performed  GO    Shirlean Mylar, OTR/L  01/20/2013, 1:52 PM

## 2013-01-25 ENCOUNTER — Ambulatory Visit (HOSPITAL_COMMUNITY)
Admission: RE | Admit: 2013-01-25 | Discharge: 2013-01-25 | Disposition: A | Discharge status: Home / Self Care | Payer: Medicare Other | Source: Ambulatory Visit | Attending: Orthopedic Surgery | Admitting: Orthopedic Surgery

## 2013-01-25 NOTE — Progress Notes (Signed)
Occupational Therapy Treatment Patient Details  Name: [name] MRN: [id number] Date of Birth: [birth year]  Today's Date: 01/25/2013 Time: 1308-6578 OT Time Calculation (min): 40 min Manual Therapy [phone] 19' ROM/MMT [phone] 21' Visit#: 16 of 8  Re-eval: 01/26/13    Authorization: [employer]  Authorization Time Period: before 18th visit  Authorization Visit#: 16 of 18  Subjective S:  I really can do everything I want to do, except lift real heavy equipment. Pertinent History: Surgery on 10/15/12 was left rotator cuff repair.   Limitations: Dr. Mort Sawyers protocol:  First 3 weeks of therapy through 5/19:  PROM only of shoulder, AROM of elbow-hand.  5/20-6/2:  continue PROM, scapular stability (ext, row strengthening).  6/3 and beyond, begin AAROM and progress to AROM as tolerated.   Special Tests: DASH score is 9, was 25, with ideal score being 0. Pain Assessment Currently in Pain?: No/denies  Precautions/Restrictions   n/a progress as tolerated  Exercise/Treatments    Manual Therapy Manual Therapy: Myofascial release Myofascial Release: Myofascial release and manual stretching to left upper arm, scapularis and trapezius region to decrease fascial restrictions and increase joint mobility in a pain free zone within parameters of protocol  Occupational Therapy Assessment and Plan OT Assessment and Plan Clinical Impression Statement: A:  Reassessment this date:  current seated AROM (12/29/12 supine AROM)  flexion 125 4+/5 (120/134 3/5), abduction 125 4+/5 (110/112 3/5), external rotation with shoulder adducted 50 4+/5 (50/56 3/5), internal rotation with shoulder adducted 85 4+/5 (80/83 3/5). More so, his AROM equals his dominant RUE.  Patient has met all OT goals, has improved his score from  25 to 9.  He is completing all functional activities at home and feels ready to return to work. OT Plan: P:  DC from skilled OT intervention.  Patient is pleased with his progress and will  continue a strengthening HEP at home.    Goals Short Term Goals Short Term Goal 2:  Patient will increase PROM to [facility] for increased independence with donning and doffing jackets and shirts. Short Term Goal 3: Patient will increase his left shoulder strength to 3+/5 for increased ability to lift arm overhead into cabinets. Short Term Goal 4: Patient will decrease the pain level in his left shoulder to 4/10 with activity and when sleeping. Short Term Goal 5: Patient will decrease fascial restrictions from mod-max to moderate in his left shoulder region for greater mobility with B/IADLs.  Long Term Goals Long Term Goal 1: Patient will return to prior level of independence with all daily, leisure, and work activities. Long Term Goal 1 Progress: Met Long Term Goal 2:  Patient will increase AROM to [facility] for increased independence with donning and doffing jackets and shirts. Long Term Goal 2 Progress: Met Long Term Goal 3: Patient will increase his left shoulder strength to 4/5 for increased ability to lift arm overhead into cabinets. Long Term Goal 3 Progress: Met Long Term Goal 4: Patient will decrease the pain level in his left shoulder to 1/10 with activity and when sleeping. Long Term Goal 4 Progress: Met Long Term Goal 5: Patient will decrease fascial restrictions from moderate to trace in his left shoulder region for greater mobility with B/IADLs.  Long Term Goal 5 Progress: Met  Problem List Patient Active Problem List   Diagnosis Date Noted  . Rotator cuff tear 01/04/2013  . S/P rotator cuff repair 01/04/2013  . Arthritis, shoulder region 10/15/2012  . Biceps tendonitis on left 10/15/2012  . Adhesive bursitis  of left shoulder 09/01/2012  . Partial tear of rotator cuff 09/01/2012  . Pain in joint, shoulder region 07/09/2011  . Muscle weakness (generalized) 07/09/2011  . Bursitis of shoulder, left 07/03/2011    End of Session Activity Tolerance: Patient tolerated treatment  well General Behavior During Therapy: WFL for tasks assessed/performed Cognition: WFL for tasks performed OT Plan of Care OT Home Exercise Plan: dowel rod exercises in seated or supine (instruction sheet given and scanned)  GO Functional Assessment Tool Used: DASH score is 9, was 25 one month ago, ideal score is 0. Functional Limitation: Carrying, moving and handling objects Carrying, Moving and Handling Objects Goal Status [phone]): At least 1 percent but less than 20 percent impaired, limited or restricted Carrying, Moving and Handling Objects Discharge Status [phone]): At least 1 percent but less than 20 percent impaired, limited or restricted  Shirlean Mylar, OTR/L  01/25/2013, 10:12 AM

## 2013-01-27 ENCOUNTER — Ambulatory Visit (HOSPITAL_COMMUNITY): Payer: Medicare Other

## 2013-02-01 ENCOUNTER — Ambulatory Visit (HOSPITAL_COMMUNITY): Payer: Medicare Other

## 2013-02-03 ENCOUNTER — Ambulatory Visit (HOSPITAL_COMMUNITY): Payer: Medicare Other | Admitting: Specialist

## 2013-02-08 ENCOUNTER — Ambulatory Visit (HOSPITAL_COMMUNITY): Payer: Medicare Other

## 2013-02-10 ENCOUNTER — Ambulatory Visit (HOSPITAL_COMMUNITY): Payer: Medicare Other

## 2013-02-24 ENCOUNTER — Encounter: Payer: Self-pay | Admitting: Orthopedic Surgery

## 2013-02-24 ENCOUNTER — Ambulatory Visit (INDEPENDENT_AMBULATORY_CARE_PROVIDER_SITE_OTHER): Payer: Medicare Other | Admitting: Orthopedic Surgery

## 2013-02-24 VITALS — BP 130/82 | Ht 70.0 in | Wt 187.0 lb

## 2013-02-24 DIAGNOSIS — Z9889 Other specified postprocedural states: Secondary | ICD-10-CM

## 2013-02-24 NOTE — Patient Instructions (Addendum)
Ok to return to work 03/02/13

## 2013-02-24 NOTE — Progress Notes (Signed)
Patient ID: [name], male   DOB: 1930/02/26, 79 y.o.   MRN: [id number] Chief Complaint  Patient presents with  . Follow-up     recheck left RC post op DOS 10-27-12    The patient's son died today but they were concerned about getting a work release to go back to work for [employer] next week  The patient says he is ready to go back to work. He still a little tight in a little weak his external rotation is 30 his forward elevation is 120 he is grade 5 minus/5 weakness in the supraspinatus  He is returning to work next week

## 2013-03-01 ENCOUNTER — Telehealth: Payer: Self-pay | Admitting: Orthopedic Surgery

## 2013-03-01 ENCOUNTER — Ambulatory Visit: Payer: Medicare Other | Admitting: Orthopedic Surgery

## 2013-03-01 NOTE — Telephone Encounter (Signed)
Faxed notes for date of service 02/24/13, including work note, to [facility] at ph# [phone] (fax # is [phone]. Authorization on file.

## 2013-06-27 ENCOUNTER — Telehealth: Payer: Self-pay | Admitting: Orthopedic Surgery

## 2013-06-27 NOTE — Telephone Encounter (Signed)
Patient's wife called to request an appointment for 06/28/13 for his left great toe, said it looks black.  Told her we do not have any available appointments for  This week and suggested she call his primary care doctor or go to the emergency room.  She agreed with this as he is a diabetic and needs to be checked very soon

## 2013-10-05 ENCOUNTER — Telehealth: Payer: Self-pay | Admitting: Orthopedic Surgery

## 2013-10-05 NOTE — Telephone Encounter (Signed)
Patient;s wife called to request an appointment for [name] for heel pain.  He has seen Dr. Hilda LiasKeeling for this problem recently and still has the pain. Explained that we will need to get the records from Dr. Hilda LiasKeeling for Dr, Romeo AppleHarrison to review as this was very recent.  Said Dr. Hilda LiasKeeling advised he see his primary Care doctor.  She will call back if decides to get the records.

## 2014-02-15 ENCOUNTER — Encounter (HOSPITAL_COMMUNITY): Payer: Self-pay | Admitting: Pharmacy Technician

## 2014-02-20 NOTE — Patient Instructions (Signed)
Your procedure is scheduled on:  Tuesday, 02/28/14  [facility] to [facility] at  0830 AM.  Call this number if you have problems the morning of surgery: [phone]   Remember:   Do not eat or drink   After Midnight.  Take these medicines the morning of surgery with A SIP OF WATER: Zantac, lisinopril. Please do not take any of youir medications for diabetes.    Do not wear jewelry, make-up or nail polish.  Do not wear lotions, powders, or perfumes. You may wear deodorant.  Do not bring valuables to the hospital.  Contacts, dentures or bridgework may not be worn into surgery.     Patients discharged the day of surgery will not be allowed to drive home.  Name and phone number of your driver: driver  Special Instructions: Use eye drops as directed.   Please read over the following fact sheets that you were given: Pain Booklet, Anesthesia Post-op Instructions and Care and Recovery After Surgery    Cataract Surgery  A cataract is a clouding of the lens of the eye. When a lens becomes cloudy, vision is reduced based on the degree and nature of the clouding. Surgery may be needed to improve vision. Surgery removes the cloudy lens and usually replaces it with a substitute lens (intraocular lens, IOL). LET YOUR EYE DOCTOR KNOW ABOUT:  Allergies to food or medicine.   Medicines taken including herbs, eyedrops, over-the-counter medicines, and creams.   Use of steroids (by mouth or creams).   Previous problems with anesthetics or numbing medicine.   History of bleeding problems or blood clots.   Previous surgery.   Other health problems, including diabetes and kidney problems.   Possibility of pregnancy, if this applies.  RISKS AND COMPLICATIONS  Infection.   Inflammation of the eyeball (endophthalmitis) that can spread to both eyes (sympathetic ophthalmia).   Poor wound healing.   If an IOL is inserted, it can later fall out of proper position. This is very uncommon.   Clouding of the  part of your eye that holds an IOL in place. This is called an "after-cataract." These are uncommon, but easily treated.  BEFORE THE PROCEDURE  Do not eat or drink anything except small amounts of water for 8 to 12 before your surgery, or as directed by your caregiver.   Unless you are told otherwise, continue any eyedrops you have been prescribed.   Talk to your primary caregiver about all other medicines that you take (both prescription and non-prescription). In some cases, you may need to stop or change medicines near the time of your surgery. This is most important if you are taking blood-thinning medicine.Do not stop medicines unless you are told to do so.   Arrange for someone to drive you to and from the procedure.   Do not put contact lenses in either eye on the day of your surgery.  PROCEDURE There is more than one method for safely removing a cataract. Your doctor can explain the differences and help determine which is best for you. Phacoemulsification surgery is the most common form of cataract surgery.  An injection is given behind the eye or eyedrops are given to make this a painless procedure.   A small cut (incision) is made on the edge of the clear, dome-shaped surface that covers the front of the eye (cornea).   A tiny probe is painlessly inserted into the eye. This device gives off ultrasound waves that soften and break up the cloudy  center of the lens. This makes it easier for the cloudy lens to be removed by suction.   An IOL may be implanted.   The normal lens of the eye is covered by a clear capsule. Part of that capsule is intentionally left in the eye to support the IOL.   Your surgeon may or may not use stitches to close the incision.  There are other forms of cataract surgery that require a larger incision and stiches to close the eye. This approach is taken in cases where the doctor feels that the cataract cannot be easily removed using  phacoemulsification. AFTER THE PROCEDURE  When an IOL is implanted, it does not need care. It becomes a permanent part of your eye and cannot be seen or felt.   Your doctor will schedule follow-up exams to check on your progress.   Review your other medicines with your doctor to see which can be resumed after surgery.   Use eyedrops or take medicine as prescribed by your doctor.  Document Released: 07/10/2011 Document Reviewed: 07/07/2011 [facility] Patient Information 2012 [place], [place].  PATIENT INSTRUCTIONS POST-ANESTHESIA  IMMEDIATELY FOLLOWING SURGERY:  Do not drive or operate machinery for the first twenty four hours after surgery.  Do not make any important decisions for twenty four hours after surgery or while taking narcotic pain medications or sedatives.  If you develop intractable nausea and vomiting or a severe headache please notify your doctor immediately.  FOLLOW-UP:  Please make an appointment with your surgeon as instructed. You do not need to follow up with anesthesia unless specifically instructed to do so.  WOUND CARE INSTRUCTIONS (if applicable):  Keep a dry clean dressing on the anesthesia/puncture wound site if there is drainage.  Once the wound has quit draining you may leave it open to air.  Generally you should leave the bandage intact for twenty four hours unless there is drainage.  If the epidural site drains for more than 36-48 hours please call the anesthesia department.  QUESTIONS?:  Please feel free to call your physician or the hospital operator if you have any questions, and they will be happy to assist you.

## 2014-02-21 ENCOUNTER — Encounter (HOSPITAL_COMMUNITY)
Admission: RE | Admit: 2014-02-21 | Discharge: 2014-02-21 | Disposition: A | Discharge status: Home / Self Care | Payer: Medicare Other | Source: Ambulatory Visit | Attending: Ophthalmology | Admitting: Ophthalmology

## 2014-02-28 NOTE — Patient Instructions (Signed)
Your procedure is scheduled on:  03/07/14  Report to [facility] at 06:30 AM.  Call this number if you have problems the morning of surgery: [phone]   Remember:   Do not eat food or drink liquids after midnight.   Take these medicines the morning of surgery with A SIP OF WATER: Lisinopril and Ranitidine   Do not wear jewelry, make-up or nail polish.  Do not wear lotions, powders, or perfumes. You may wear deodorant.  Do not bring valuables to the hospital.  [facility] is not responsible for any belongings or valuables.               Contacts, dentures or bridgework may not be worn into surgery.               Patients discharged the day of surgery will not be allowed to drive home.   Special Instructions: Start using your eye drops prior to surgery as directed by your eye doctor.   Please read over the following fact sheets that you were given: Anesthesia Post-op Instructions and Care and Recovery After Surgery     Cataract Surgery  A cataract is a clouding of the lens of the eye. When a lens becomes cloudy, vision is reduced based on the degree and nature of the clouding. Surgery may be needed to improve vision. Surgery removes the cloudy lens and usually replaces it with a substitute lens (intraocular lens, IOL). LET YOUR EYE DOCTOR KNOW ABOUT:  Allergies to food or medicine.  Medicines taken including herbs, eyedrops, over-the-counter medicines, and creams.  Use of steroids (by mouth or creams).  Previous problems with anesthetics or numbing medicine.  History of bleeding problems or blood clots.  Previous surgery.  Other health problems, including diabetes and kidney problems.  Possibility of pregnancy, if this applies. RISKS AND COMPLICATIONS  Infection.  Inflammation of the eyeball (endophthalmitis) that can spread to both eyes (sympathetic ophthalmia).  Poor wound healing.  If an IOL is inserted, it can later fall out of proper position. This is very  uncommon.  Clouding of the part of your eye that holds an IOL in place. This is called an "after-cataract." These are uncommon, but easily treated. BEFORE THE PROCEDURE  Do not eat or drink anything except small amounts of water for 8 to 12 before your surgery, or as directed by your caregiver.  Unless you are told otherwise, continue any eyedrops you have been prescribed.  Talk to your primary caregiver about all other medicines that you take (both prescription and non-prescription). In some cases, you may need to stop or change medicines near the time of your surgery. This is most important if you are taking blood-thinning medicine.Do not stop medicines unless you are told to do so.  Arrange for someone to drive you to and from the procedure.  Do not put contact lenses in either eye on the day of your surgery. PROCEDURE There is more than one method for safely removing a cataract. Your doctor can explain the differences and help determine which is best for you. Phacoemulsification surgery is the most common form of cataract surgery.  An injection is given behind the eye or eyedrops are given to make this a painless procedure.  A small cut (incision) is made on the edge of the clear, dome-shaped surface that covers the front of the eye (cornea).  A tiny probe is painlessly inserted into the eye. This device gives off ultrasound waves that soften and break  up the cloudy center of the lens. This makes it easier for the cloudy lens to be removed by suction.  An IOL may be implanted.  The normal lens of the eye is covered by a clear capsule. Part of that capsule is intentionally left in the eye to support the IOL.  Your surgeon may or may not use stitches to close the incision. There are other forms of cataract surgery that require a larger incision and stiches to close the eye. This approach is taken in cases where the doctor feels that the cataract cannot be easily removed using  phacoemulsification. AFTER THE PROCEDURE  When an IOL is implanted, it does not need care. It becomes a permanent part of your eye and cannot be seen or felt.  Your doctor will schedule follow-up exams to check on your progress.  Review your other medicines with your doctor to see which can be resumed after surgery.  Use eyedrops or take medicine as prescribed by your doctor. Document Released: 07/10/2011 Document Revised: 10/13/2011 Document Reviewed: 07/10/2011 [facility] Patient Information 2013 [place].    PATIENT INSTRUCTIONS POST-ANESTHESIA  IMMEDIATELY FOLLOWING SURGERY:  Do not drive or operate machinery for the first twenty four hours after surgery.  Do not make any important decisions for twenty four hours after surgery or while taking narcotic pain medications or sedatives.  If you develop intractable nausea and vomiting or a severe headache please notify your doctor immediately.  FOLLOW-UP:  Please make an appointment with your surgeon as instructed. You do not need to follow up with anesthesia unless specifically instructed to do so.  WOUND CARE INSTRUCTIONS (if applicable):  Keep a dry clean dressing on the anesthesia/puncture wound site if there is drainage.  Once the wound has quit draining you may leave it open to air.  Generally you should leave the bandage intact for twenty four hours unless there is drainage.  If the epidural site drains for more than 36-48 hours please call the anesthesia department.  QUESTIONS?:  Please feel free to call your physician or the hospital operator if you have any questions, and they will be happy to assist you.

## 2014-03-01 ENCOUNTER — Encounter (HOSPITAL_COMMUNITY): Payer: Self-pay

## 2014-03-01 ENCOUNTER — Encounter (HOSPITAL_COMMUNITY)
Admission: RE | Admit: 2014-03-01 | Discharge: 2014-03-01 | Disposition: A | Discharge status: Home / Self Care | Payer: Medicare Other | Source: Ambulatory Visit | Attending: Ophthalmology | Admitting: Ophthalmology

## 2014-03-01 DIAGNOSIS — Z01812 Encounter for preprocedural laboratory examination: Secondary | ICD-10-CM | POA: Diagnosis not present

## 2014-03-01 DIAGNOSIS — Z01810 Encounter for preprocedural cardiovascular examination: Secondary | ICD-10-CM | POA: Insufficient documentation

## 2014-03-01 LAB — BASIC METABOLIC PANEL
ANION GAP: 14 (ref 5–15)
BUN: 12 mg/dL (ref 6–23)
CALCIUM: 8.7 mg/dL (ref 8.4–10.5)
CO2: 24 meq/L (ref 19–32)
CREATININE: 1 mg/dL (ref 0.50–1.35)
Chloride: 103 mEq/L (ref 96–112)
GFR, EST AFRICAN AMERICAN: 79 mL/min — AB (ref >90)
GFR, EST NON AFRICAN AMERICAN: 68 mL/min — AB (ref >90)
Glucose, Bld: 250 mg/dL — ABNORMAL HIGH (ref 70–99)
Potassium: 4.9 mEq/L (ref 3.7–5.3)
SODIUM: 141 meq/L (ref 137–147)

## 2014-03-01 LAB — CBC WITH DIFFERENTIAL/PLATELET
BASOS ABS: 0 10*3/uL (ref 0.0–0.1)
BASOS PCT: 0 % (ref 0–1)
EOS ABS: 0.1 10*3/uL (ref 0.0–0.7)
EOS PCT: 1 % (ref 0–5)
HEMATOCRIT: 33.7 % — AB (ref 39.0–52.0)
Hemoglobin: 11.3 g/dL — ABNORMAL LOW (ref 13.0–17.0)
Lymphocytes Relative: 22 % (ref 12–46)
Lymphs Abs: 1.4 10*3/uL (ref 0.7–4.0)
MCH: 28.5 pg (ref 26.0–34.0)
MCHC: 33.5 g/dL (ref 30.0–36.0)
MCV: 85.1 fL (ref 78.0–100.0)
MONO ABS: 0.6 10*3/uL (ref 0.1–1.0)
Monocytes Relative: 9 % (ref 3–12)
Neutro Abs: 4.3 10*3/uL (ref 1.7–7.7)
Neutrophils Relative %: 68 % (ref 43–77)
Platelets: 127 10*3/uL — ABNORMAL LOW (ref 150–400)
RBC: 3.96 MIL/uL — ABNORMAL LOW (ref 4.22–5.81)
RDW: 13.6 % (ref 11.5–15.5)
WBC: 6.4 10*3/uL (ref 4.0–10.5)

## 2014-03-06 MED ORDER — TETRACAINE HCL 0.5 % OP SOLN
OPHTHALMIC | Status: AC
  Filled 2014-03-06: qty 2

## 2014-03-06 MED ORDER — CYCLOPENTOLATE-PHENYLEPHRINE OP SOLN OPTIME - NO CHARGE
OPHTHALMIC | Status: AC
  Filled 2014-03-06: qty 2

## 2014-03-06 MED ORDER — PHENYLEPHRINE HCL 2.5 % OP SOLN
OPHTHALMIC | Status: AC
  Filled 2014-03-06: qty 15

## 2014-03-06 MED ORDER — KETOROLAC TROMETHAMINE 0.5 % OP SOLN
OPHTHALMIC | Status: AC
  Filled 2014-03-06: qty 5

## 2014-03-07 ENCOUNTER — Encounter (HOSPITAL_COMMUNITY): Payer: Self-pay

## 2014-03-07 ENCOUNTER — Encounter (HOSPITAL_COMMUNITY): Payer: Self-pay | Admitting: *Deleted

## 2014-03-07 ENCOUNTER — Encounter (HOSPITAL_COMMUNITY)
Admission: RE | Disposition: A | Discharge status: Home / Self Care | Payer: Self-pay | Source: Ambulatory Visit | Attending: Ophthalmology

## 2014-03-07 ENCOUNTER — Ambulatory Visit (HOSPITAL_COMMUNITY)
Admission: RE | Admit: 2014-03-07 | Discharge: 2014-03-07 | Disposition: A | Discharge status: Home / Self Care | Payer: Medicare Other | Source: Ambulatory Visit | Attending: Ophthalmology | Admitting: Ophthalmology

## 2014-03-07 ENCOUNTER — Encounter (HOSPITAL_COMMUNITY): Payer: Medicare Other | Admitting: Anesthesiology

## 2014-03-07 ENCOUNTER — Ambulatory Visit (HOSPITAL_COMMUNITY): Payer: Medicare Other | Admitting: Anesthesiology

## 2014-03-07 ENCOUNTER — Encounter (HOSPITAL_COMMUNITY): Payer: Self-pay | Admitting: Pharmacy Technician

## 2014-03-07 DIAGNOSIS — Z87891 Personal history of nicotine dependence: Secondary | ICD-10-CM | POA: Insufficient documentation

## 2014-03-07 DIAGNOSIS — H2510 Age-related nuclear cataract, unspecified eye: Secondary | ICD-10-CM | POA: Insufficient documentation

## 2014-03-07 DIAGNOSIS — I10 Essential (primary) hypertension: Secondary | ICD-10-CM | POA: Diagnosis not present

## 2014-03-07 DIAGNOSIS — Z7982 Long term (current) use of aspirin: Secondary | ICD-10-CM | POA: Insufficient documentation

## 2014-03-07 DIAGNOSIS — K219 Gastro-esophageal reflux disease without esophagitis: Secondary | ICD-10-CM | POA: Insufficient documentation

## 2014-03-07 DIAGNOSIS — Z79899 Other long term (current) drug therapy: Secondary | ICD-10-CM | POA: Insufficient documentation

## 2014-03-07 DIAGNOSIS — E119 Type 2 diabetes mellitus without complications: Secondary | ICD-10-CM | POA: Diagnosis not present

## 2014-03-07 HISTORY — PX: CATARACT EXTRACTION W/PHACO: SHX586

## 2014-03-07 LAB — GLUCOSE, CAPILLARY: GLUCOSE-CAPILLARY: 145 mg/dL — AB (ref 70–99)

## 2014-03-07 SURGERY — PHACOEMULSIFICATION, CATARACT, WITH IOL INSERTION
Anesthesia: Monitor Anesthesia Care | Laterality: Left

## 2014-03-07 MED ORDER — CYCLOPENTOLATE-PHENYLEPHRINE 0.2-1 % OP SOLN
1.0000 [drp] | OPHTHALMIC | Status: AC
  Administered 2014-03-07 (×3): 1 [drp] via OPHTHALMIC

## 2014-03-07 MED ORDER — EPINEPHRINE HCL 1 MG/ML IJ SOLN
INTRAMUSCULAR | Status: DC | PRN
  Administered 2014-03-07: 08:00:00

## 2014-03-07 MED ORDER — EPINEPHRINE HCL 1 MG/ML IJ SOLN
INTRAMUSCULAR | Status: AC
Start: 2014-03-07 — End: 2014-03-07
  Filled 2014-03-07: qty 1

## 2014-03-07 MED ORDER — PHENYLEPHRINE HCL 2.5 % OP SOLN
1.0000 [drp] | OPHTHALMIC | Status: AC
  Administered 2014-03-07 (×3): 1 [drp] via OPHTHALMIC

## 2014-03-07 MED ORDER — TETRACAINE HCL 0.5 % OP SOLN
1.0000 [drp] | OPHTHALMIC | Status: AC
  Administered 2014-03-07 (×3): 1 [drp] via OPHTHALMIC

## 2014-03-07 MED ORDER — FENTANYL CITRATE 0.05 MG/ML IJ SOLN
25.0000 ug | INTRAMUSCULAR | Status: DC | PRN
  Administered 2014-03-14: 25 ug via INTRAVENOUS

## 2014-03-07 MED ORDER — KETOROLAC TROMETHAMINE 0.5 % OP SOLN
1.0000 [drp] | OPHTHALMIC | Status: AC
  Administered 2014-03-07 (×3): 1 [drp] via OPHTHALMIC

## 2014-03-07 MED ORDER — MIDAZOLAM HCL 2 MG/2ML IJ SOLN
1.0000 mg | INTRAMUSCULAR | Status: DC | PRN
  Administered 2014-03-07: 2 mg via INTRAVENOUS

## 2014-03-07 MED ORDER — FENTANYL CITRATE 0.05 MG/ML IJ SOLN
INTRAMUSCULAR | Status: AC
  Filled 2014-03-07: qty 2

## 2014-03-07 MED ORDER — ONDANSETRON HCL 4 MG/2ML IJ SOLN: 4.0000 mg | Freq: Once | INTRAMUSCULAR | Status: AC | PRN

## 2014-03-07 MED ORDER — LACTATED RINGERS IV SOLN
INTRAVENOUS | Status: DC
  Administered 2014-03-07: 08:00:00 via INTRAVENOUS

## 2014-03-07 MED ORDER — FENTANYL CITRATE 0.05 MG/ML IJ SOLN
25.0000 ug | INTRAMUSCULAR | Status: AC
  Administered 2014-03-07: 25 ug via INTRAVENOUS

## 2014-03-07 MED ORDER — MIDAZOLAM HCL 2 MG/2ML IJ SOLN
INTRAMUSCULAR | Status: AC
  Filled 2014-03-07: qty 2

## 2014-03-07 MED ORDER — PROVISC 10 MG/ML IO SOLN
INTRAOCULAR | Status: DC | PRN
  Administered 2014-03-07: 0.85 mL via INTRAOCULAR

## 2014-03-07 MED ORDER — BSS IO SOLN
INTRAOCULAR | Status: DC | PRN
  Administered 2014-03-07: 15 mL via INTRAOCULAR

## 2014-03-07 SURGICAL SUPPLY — 10 items
CLOTH BEACON ORANGE TIMEOUT ST (SAFETY) ×2 IMPLANT
EYE SHIELD UNIVERSAL CLEAR (GAUZE/BANDAGES/DRESSINGS) ×2 IMPLANT
GLOVE BIO SURGEON STRL SZ 6.5 (GLOVE) ×2 IMPLANT
GLOVE BIO SURGEONS STRL SZ 6.5 (GLOVE) ×2
GLOVE EXAM NITRILE MD LF STRL (GLOVE) ×2 IMPLANT
PAD ARMBOARD 7.5X6 YLW CONV (MISCELLANEOUS) ×2 IMPLANT
SIGHTPATH CAT PROC W REG LENS (Ophthalmic Related) ×3 IMPLANT
TAPE SURG TRANSPORE 1 IN (GAUZE/BANDAGES/DRESSINGS) IMPLANT
TAPE SURGICAL TRANSPORE 1 IN (GAUZE/BANDAGES/DRESSINGS) ×2
WATER STERILE IRR 250ML POUR (IV SOLUTION) ×2 IMPLANT

## 2014-03-07 NOTE — Op Note (Signed)
Patient brought to the operating room and prepped and draped in the usual manner.  Lid speculum inserted in left eye.  Stab incision made at the twelve o'clock position.  Provisc instilled in the anterior chamber.   A 2.4 mm. Stab incision was made temporally.  An anterior capsulotomy was done with a bent 25 gauge needle.  The nucleus was hydrodissected.  The Phaco tip was inserted in the anterior chamber and the nucleus was emulsified.  CDE was 7.11.  The cortical material was then removed with the I and A tip.  Posterior capsule was the polished.  The anterior chamber was deepened with Provisc.  A 21.0 Diopter Rayner 570C IOL was then inserted in the capsular bag.  Provisc was then removed with the I and A tip.  The wound was then hydrated.  Patient sent to the Recovery Room in good condition with follow up in my office.  Preoperative Diagnosis:  Nuclear Cataract OS Postoperative Diagnosis:  Same Procedure name: [facility]man Phacoemulsification OS with IOL

## 2014-03-07 NOTE — Discharge Instructions (Signed)
Ples SpecterFrancis Brancato  03/07/2014           [facility] Instructions [address]- [place] [id number] [address]      1. Avoid closing eyes tightly. One often closes the eye tightly when laughing, talking, sneezing, coughing or if they feel irritated. At these times, you should be careful not to close your eyes tightly.  2. Instill eye drops as instructed. To instill drops in your eye, open it, look up and have someone gently pull the lower lid down and instill a couple of drops inside the lower lid.  3. Do not touch upper lid.  4. Take Advil or Tylenol for pain.  5. You may use either eye for near work, such as reading or sewing and you may watch television.  6. You may have your hair done at the beauty parlor at any time.  7. Wear dark glasses with or without your own glasses if you are in bright light.  8. Call our office at [phone] or [phone] if you have sharp pain in your eye or unusual symptoms.  9. Do not be concerned because vision in the operative eye is not good. It will not be good, no matter how successful the operation, until you get a special lens for it. Your old glasses will not be suited to the new eye that was operated on and you will not be ready for a new lens for about a month.  10. [facility].    I have received a copy of the above instructions and will follow them.

## 2014-03-07 NOTE — Transfer of Care (Signed)
Immediate Anesthesia Transfer of Care Note  Patient: [name]  Procedure(s) Performed: Procedure(s): CATARACT EXTRACTION PHACO AND INTRAOCULAR LENS PLACEMENT LEFT EYE CDE=7.11 (Left)  Patient Location: Short Stay  Anesthesia Type:MAC  Level of Consciousness: awake, alert , oriented and patient cooperative  Airway & Oxygen Therapy: Patient Spontanous Breathing  Post-op Assessment: Report given to PACU RN, Post -op Vital signs reviewed and stable and Patient moving all extremities  Post vital signs: Reviewed and stable  Complications: No apparent anesthesia complications

## 2014-03-07 NOTE — Anesthesia Postprocedure Evaluation (Signed)
  Anesthesia Post-op Note  Patient: [name]  Procedure(s) Performed: Procedure(s): CATARACT EXTRACTION PHACO AND INTRAOCULAR LENS PLACEMENT LEFT EYE CDE=7.11 (Left)  Patient Location: Short Stay  Anesthesia Type:MAC  Level of Consciousness: awake, alert , oriented and patient cooperative  Airway and Oxygen Therapy: Patient Spontanous Breathing  Post-op Pain: none  Post-op Assessment: Post-op Vital signs reviewed, Patient's Cardiovascular Status Stable, Respiratory Function Stable, Patent Airway and Pain level controlled  Post-op Vital Signs: Reviewed and stable  Last Vitals:  Filed Vitals:   03/07/14 0750  BP: 128/74  Temp:   Resp: 20    Complications: No apparent anesthesia complications

## 2014-03-07 NOTE — Anesthesia Preprocedure Evaluation (Signed)
Anesthesia Evaluation  Patient identified by MRN, date of birth, ID band Patient awake    Reviewed: Allergy & Precautions, H&P , NPO status , Patient's Chart, lab work & pertinent test results  Airway Mallampati: III TM Distance: >3 FB     Dental  (+) Edentulous Upper, Edentulous Lower   Pulmonary neg pulmonary ROS, former smoker,  breath sounds clear to auscultation        Cardiovascular hypertension, Pt. on medications Rhythm:Regular Rate:Normal     Neuro/Psych    GI/Hepatic GERD-  Medicated and Controlled,  Endo/Other  diabetes, Well Controlled, Type 2, Oral Hypoglycemic Agents  Renal/GU      Musculoskeletal   Abdominal   Peds  Hematology   Anesthesia Other Findings   Reproductive/Obstetrics                           Anesthesia Physical Anesthesia Plan  ASA: III  Anesthesia Plan: MAC   Post-op Pain Management:    Induction: Intravenous  Airway Management Planned: Nasal Cannula  Additional Equipment:   Intra-op Plan:   Post-operative Plan:   Informed Consent: I have reviewed the patients History and Physical, chart, labs and discussed the procedure including the risks, benefits and alternatives for the proposed anesthesia with the patient or authorized representative who has indicated his/her understanding and acceptance.     Plan Discussed with:   Anesthesia Plan Comments:         Anesthesia Quick Evaluation

## 2014-03-07 NOTE — H&P (Signed)
The patient was re examined and there is no change in the patients condition since the original H and P. 

## 2014-03-07 NOTE — Patient Instructions (Signed)
Your procedure is scheduled on: 03/14/2014  [facility]   [facility].  Call this number if you have problems the morning of surgery: [phone]   Do not eat food or drink liquids :After Midnight.      Take these medicines the morning of surgery with A SIP OF WATER: lisinopril and zantac   Do not wear jewelry, make-up or nail polish.  Do not wear lotions, powders, or perfumes.   Do not shave 48 hours prior to surgery.  Do not bring valuables to the hospital.  Contacts, dentures or bridgework may not be worn into surgery.  Leave suitcase in the car. After surgery it may be brought to your room.  For patients admitted to the hospital, checkout time is 11:00 AM the day of discharge.   Patients discharged the day of surgery will not be allowed to drive home.  :     Please read over the following fact sheets that you were given: Coughing and Deep Breathing, Surgical Site Infection Prevention, Anesthesia Post-op Instructions and Care and Recovery After Surgery    Cataract A cataract is a clouding of the lens of the eye. When a lens becomes cloudy, vision is reduced based on the degree and nature of the clouding. Many cataracts reduce vision to some degree. Some cataracts make people more near-sighted as they develop. Other cataracts increase glare. Cataracts that are ignored and become worse can sometimes look white. The white color can be seen through the pupil. CAUSES   Aging. However, cataracts may occur at any age, even in newborns.   Certain drugs.   Trauma to the eye.   Certain diseases such as diabetes.   Specific eye diseases such as chronic inflammation inside the eye or a sudden attack of a rare form of glaucoma.   Inherited or acquired medical problems.  SYMPTOMS   Gradual, progressive drop in vision in the affected eye.   Severe, rapid visual loss. This most often happens when trauma is the cause.  DIAGNOSIS  To detect a cataract, an eye doctor examines the  lens. Cataracts are best diagnosed with an exam of the eyes with the pupils enlarged (dilated) by drops.  TREATMENT  For an early cataract, vision may improve by using different eyeglasses or stronger lighting. If that does not help your vision, surgery is the only effective treatment. A cataract needs to be surgically removed when vision loss interferes with your everyday activities, such as driving, reading, or watching TV. A cataract may also have to be removed if it prevents examination or treatment of another eye problem. Surgery removes the cloudy lens and usually replaces it with a substitute lens (intraocular lens, IOL).  At a time when both you and your doctor agree, the cataract will be surgically removed. If you have cataracts in both eyes, only one is usually removed at a time. This allows the operated eye to heal and be out of danger from any possible problems after surgery (such as infection or poor wound healing). In rare cases, a cataract may be doing damage to your eye. In these cases, your caregiver may advise surgical removal right away. The vast majority of people who have cataract surgery have better vision afterward. HOME CARE INSTRUCTIONS  If you are not planning surgery, you may be asked to do the following:  Use different eyeglasses.   Use stronger or brighter lighting.   Ask your eye doctor about reducing your medicine dose or  changing medicines if it is thought that a medicine caused your cataract. Changing medicines does not make the cataract go away on its own.   Become familiar with your surroundings. Poor vision can lead to injury. Avoid bumping into things on the affected side. You are at a higher risk for tripping or falling.   Exercise extreme care when driving or operating machinery.   Wear sunglasses if you are sensitive to bright light or experiencing problems with glare.  SEEK IMMEDIATE MEDICAL CARE IF:   You have a worsening or sudden vision loss.   You  notice redness, swelling, or increasing pain in the eye.   You have a fever.  Document Released: 07/21/2005 Document Revised: 07/10/2011 Document Reviewed: 03/14/2011 [facility] Patient Information 2012 [place].PATIENT INSTRUCTIONS POST-ANESTHESIA  IMMEDIATELY FOLLOWING SURGERY:  Do not drive or operate machinery for the first twenty four hours after surgery.  Do not make any important decisions for twenty four hours after surgery or while taking narcotic pain medications or sedatives.  If you develop intractable nausea and vomiting or a severe headache please notify your doctor immediately.  FOLLOW-UP:  Please make an appointment with your surgeon as instructed. You do not need to follow up with anesthesia unless specifically instructed to do so.  WOUND CARE INSTRUCTIONS (if applicable):  Keep a dry clean dressing on the anesthesia/puncture wound site if there is drainage.  Once the wound has quit draining you may leave it open to air.  Generally you should leave the bandage intact for twenty four hours unless there is drainage.  If the epidural site drains for more than 36-48 hours please call the anesthesia department.  QUESTIONS?:  Please feel free to call your physician or the hospital operator if you have any questions, and they will be happy to assist you.

## 2014-03-08 ENCOUNTER — Encounter (HOSPITAL_COMMUNITY): Payer: Self-pay | Admitting: Ophthalmology

## 2014-03-08 ENCOUNTER — Encounter (HOSPITAL_COMMUNITY): Payer: Medicare Other | Attending: Ophthalmology

## 2014-03-13 MED ORDER — KETOROLAC TROMETHAMINE 0.5 % OP SOLN
OPHTHALMIC | Status: AC
  Filled 2014-03-13: qty 5

## 2014-03-13 MED ORDER — TETRACAINE HCL 0.5 % OP SOLN
OPHTHALMIC | Status: AC
  Filled 2014-03-13: qty 2

## 2014-03-13 MED ORDER — PHENYLEPHRINE HCL 2.5 % OP SOLN
OPHTHALMIC | Status: AC
Start: 2014-03-13 — End: 2014-03-13
  Filled 2014-03-13: qty 15

## 2014-03-13 MED ORDER — CYCLOPENTOLATE-PHENYLEPHRINE OP SOLN OPTIME - NO CHARGE
OPHTHALMIC | Status: AC
  Filled 2014-03-13: qty 2

## 2014-03-14 ENCOUNTER — Encounter (HOSPITAL_COMMUNITY)
Admission: RE | Disposition: A | Discharge status: Home / Self Care | Payer: Self-pay | Source: Ambulatory Visit | Attending: Ophthalmology

## 2014-03-14 ENCOUNTER — Ambulatory Visit (HOSPITAL_COMMUNITY)
Admission: RE | Admit: 2014-03-14 | Discharge: 2014-03-14 | Disposition: A | Discharge status: Home / Self Care | Payer: Medicare Other | Source: Ambulatory Visit | Attending: Ophthalmology | Admitting: Ophthalmology

## 2014-03-14 ENCOUNTER — Encounter (HOSPITAL_COMMUNITY): Payer: Medicare Other | Admitting: Anesthesiology

## 2014-03-14 ENCOUNTER — Ambulatory Visit (HOSPITAL_COMMUNITY): Payer: Medicare Other | Admitting: Anesthesiology

## 2014-03-14 ENCOUNTER — Encounter (HOSPITAL_COMMUNITY): Payer: Self-pay | Admitting: Anesthesiology

## 2014-03-14 DIAGNOSIS — H2510 Age-related nuclear cataract, unspecified eye: Secondary | ICD-10-CM | POA: Insufficient documentation

## 2014-03-14 DIAGNOSIS — Z87891 Personal history of nicotine dependence: Secondary | ICD-10-CM | POA: Insufficient documentation

## 2014-03-14 DIAGNOSIS — E119 Type 2 diabetes mellitus without complications: Secondary | ICD-10-CM | POA: Diagnosis not present

## 2014-03-14 DIAGNOSIS — I10 Essential (primary) hypertension: Secondary | ICD-10-CM | POA: Diagnosis not present

## 2014-03-14 DIAGNOSIS — K219 Gastro-esophageal reflux disease without esophagitis: Secondary | ICD-10-CM | POA: Diagnosis not present

## 2014-03-14 HISTORY — PX: CATARACT EXTRACTION W/PHACO: SHX586

## 2014-03-14 LAB — GLUCOSE, CAPILLARY: GLUCOSE-CAPILLARY: 205 mg/dL — AB (ref 70–99)

## 2014-03-14 SURGERY — PHACOEMULSIFICATION, CATARACT, WITH IOL INSERTION
Anesthesia: Monitor Anesthesia Care | Laterality: Right

## 2014-03-14 MED ORDER — EPINEPHRINE HCL 1 MG/ML IJ SOLN
INTRAMUSCULAR | Status: DC | PRN
  Administered 2014-03-14: 08:00:00

## 2014-03-14 MED ORDER — FENTANYL CITRATE 0.05 MG/ML IJ SOLN
25.0000 ug | INTRAMUSCULAR | Status: AC
  Administered 2014-03-14: 25 ug via INTRAVENOUS

## 2014-03-14 MED ORDER — EPINEPHRINE HCL 1 MG/ML IJ SOLN
INTRAMUSCULAR | Status: AC
  Filled 2014-03-14: qty 1

## 2014-03-14 MED ORDER — CYCLOPENTOLATE-PHENYLEPHRINE 0.2-1 % OP SOLN
1.0000 [drp] | OPHTHALMIC | Status: AC
  Administered 2014-03-14 (×3): 1 [drp] via OPHTHALMIC

## 2014-03-14 MED ORDER — BSS IO SOLN
INTRAOCULAR | Status: DC | PRN
  Administered 2014-03-14: 15 mL

## 2014-03-14 MED ORDER — LACTATED RINGERS IV SOLN
INTRAVENOUS | Status: DC
  Administered 2014-03-14: 08:00:00 via INTRAVENOUS

## 2014-03-14 MED ORDER — PHENYLEPHRINE HCL 2.5 % OP SOLN
1.0000 [drp] | OPHTHALMIC | Status: AC
  Administered 2014-03-14 (×3): 1 [drp] via OPHTHALMIC

## 2014-03-14 MED ORDER — LACTATED RINGERS IV SOLN
INTRAVENOUS | Status: DC | PRN
  Administered 2014-03-14: 08:00:00 via INTRAVENOUS

## 2014-03-14 MED ORDER — TETRACAINE 0.5 % OP SOLN OPTIME - NO CHARGE
OPHTHALMIC | Status: DC | PRN
  Administered 2014-03-14: 2 [drp] via OPHTHALMIC

## 2014-03-14 MED ORDER — MIDAZOLAM HCL 2 MG/2ML IJ SOLN
1.0000 mg | INTRAMUSCULAR | Status: DC | PRN
  Administered 2014-03-14: 2 mg via INTRAVENOUS

## 2014-03-14 MED ORDER — FENTANYL CITRATE 0.05 MG/ML IJ SOLN
INTRAMUSCULAR | Status: AC
  Filled 2014-03-14: qty 2

## 2014-03-14 MED ORDER — PROVISC 10 MG/ML IO SOLN
INTRAOCULAR | Status: DC | PRN
  Administered 2014-03-14: 0.85 mL via INTRAOCULAR

## 2014-03-14 MED ORDER — MIDAZOLAM HCL 2 MG/2ML IJ SOLN
INTRAMUSCULAR | Status: AC
  Filled 2014-03-14: qty 2

## 2014-03-14 MED ORDER — KETOROLAC TROMETHAMINE 0.5 % OP SOLN
1.0000 [drp] | OPHTHALMIC | Status: AC
  Administered 2014-03-14 (×3): 1 [drp] via OPHTHALMIC

## 2014-03-14 MED ORDER — TETRACAINE HCL 0.5 % OP SOLN
1.0000 [drp] | OPHTHALMIC | Status: AC
  Administered 2014-03-14 (×3): 1 [drp] via OPHTHALMIC

## 2014-03-14 SURGICAL SUPPLY — 10 items
CLOTH BEACON ORANGE TIMEOUT ST (SAFETY) ×2 IMPLANT
EYE SHIELD UNIVERSAL CLEAR (GAUZE/BANDAGES/DRESSINGS) ×2 IMPLANT
GLOVE BIOGEL PI IND STRL 7.0 (GLOVE) IMPLANT
GLOVE BIOGEL PI INDICATOR 7.0 (GLOVE) ×2
GLOVE EXAM NITRILE MD LF STRL (GLOVE) ×2 IMPLANT
PAD ARMBOARD 7.5X6 YLW CONV (MISCELLANEOUS) ×2 IMPLANT
SIGHTPATH CAT PROC W REG LENS (Ophthalmic Related) ×3 IMPLANT
TAPE SURG TRANSPORE 1 IN (GAUZE/BANDAGES/DRESSINGS) IMPLANT
TAPE SURGICAL TRANSPORE 1 IN (GAUZE/BANDAGES/DRESSINGS) ×2
WATER STERILE IRR 250ML POUR (IV SOLUTION) ×2 IMPLANT

## 2014-03-14 NOTE — Anesthesia Preprocedure Evaluation (Signed)
Anesthesia Evaluation  Patient identified by MRN, date of birth, ID band Patient awake    Reviewed: Allergy & Precautions, H&P , NPO status , Patient's Chart, lab work & pertinent test results  Airway Mallampati: III TM Distance: >3 FB     Dental  (+) Edentulous Upper, Edentulous Lower   Pulmonary neg pulmonary ROS, former smoker,  breath sounds clear to auscultation        Cardiovascular hypertension, Pt. on medications Rhythm:Regular Rate:Normal     Neuro/Psych    GI/Hepatic GERD-  Medicated and Controlled,  Endo/Other  diabetes, Well Controlled, Type 2, Oral Hypoglycemic Agents  Renal/GU      Musculoskeletal   Abdominal   Peds  Hematology   Anesthesia Other Findings   Reproductive/Obstetrics                           Anesthesia Physical Anesthesia Plan  ASA: III  Anesthesia Plan: MAC   Post-op Pain Management:    Induction: Intravenous  Airway Management Planned: Nasal Cannula  Additional Equipment:   Intra-op Plan:   Post-operative Plan:   Informed Consent: I have reviewed the patients History and Physical, chart, labs and discussed the procedure including the risks, benefits and alternatives for the proposed anesthesia with the patient or authorized representative who has indicated his/her understanding and acceptance.     Plan Discussed with:   Anesthesia Plan Comments:         Anesthesia Quick Evaluation  

## 2014-03-14 NOTE — Anesthesia Postprocedure Evaluation (Signed)
  Anesthesia Post-op Note  Patient: [name]  Procedure(s) Performed: Procedure(s): CATARACT EXTRACTION PHACO AND INTRAOCULAR LENS PLACEMENT RIGHT EYE CDE=10.62 (Right)  Patient Location: Short Stay  Anesthesia Type:MAC  Level of Consciousness: awake, alert , oriented and patient cooperative  Airway and Oxygen Therapy: Patient Spontanous Breathing  Post-op Pain: none  Post-op Assessment: Post-op Vital signs reviewed, Patient's Cardiovascular Status Stable, Respiratory Function Stable, Patent Airway, No signs of Nausea or vomiting and Pain level controlled  Post-op Vital Signs: Reviewed and stable  Last Vitals:  Filed Vitals:   03/14/14 0727  Pulse: 74  Temp: 36.6 C  Resp: 18    Complications: No apparent anesthesia complications

## 2014-03-14 NOTE — H&P (Signed)
The patient was re examined and there is no change in the patients condition since the original H and P. 

## 2014-03-14 NOTE — Transfer of Care (Signed)
Immediate Anesthesia Transfer of Care Note  Patient: [name]  Procedure(s) Performed: Procedure(s): CATARACT EXTRACTION PHACO AND INTRAOCULAR LENS PLACEMENT RIGHT EYE CDE=10.62 (Right)  Patient Location: Short Stay  Anesthesia Type:MAC  Level of Consciousness: awake, alert  and oriented  Airway & Oxygen Therapy: Patient Spontanous Breathing  Post-op Assessment: Report given to PACU RN and Post -op Vital signs reviewed and stable  Post vital signs: Reviewed and stable  Complications: No apparent anesthesia complications

## 2014-03-14 NOTE — Op Note (Signed)
Patient brought to the operating room and prepped and draped in the usual manner.  Lid speculum inserted in right eye.  Stab incision made at the twelve o'clock position.  Provisc instilled in the anterior chamber.   A 2.4 mm. Stab incision was made temporally.  An anterior capsulotomy was done with a bent 25 gauge needle.  The nucleus was hydrodissected.  The Phaco tip was inserted in the anterior chamber and the nucleus was emulsified.  CDE was 10.62.  The cortical material was then removed with the I and A tip.  Posterior capsule was the polished.  The anterior chamber was deepened with Provisc.  A 20.5 Diopter Rayner 570C IOL was then inserted in the capsular bag.  Provisc was then removed with the I and A tip.  The wound was then hydrated.  Patient sent to the Recovery Room in good condition with follow up in my office.  Preoperative Diagnosis:  Nuclear Cataract OD Postoperative Diagnosis:  Same Procedure name: Kelman Phacoemulsification OD with IOL

## 2014-03-14 NOTE — Discharge Instructions (Signed)
[name]  03/14/2014           [facility] Instructions [address]- [place] [id number] [address]      1. Avoid closing eyes tightly. One often closes the eye tightly when laughing, talking, sneezing, coughing or if they feel irritated. At these times, you should be careful not to close your eyes tightly.  2. Instill eye drops as instructed. To instill drops in your eye, open it, look up and have someone gently pull the lower lid down and instill a couple of drops inside the lower lid.  3. Do not touch upper lid.  4. Take Advil or Tylenol for pain.  5. You may use either eye for near work, such as reading or sewing and you may watch television.  6. You may have your hair done at the beauty parlor at any time.  7. Wear dark glasses with or without your own glasses if you are in bright light.  8. Call our office at [phone] or [phone] if you have sharp pain in your eye or unusual symptoms.  9. Do not be concerned because vision in the operative eye is not good. It will not be good, no matter how successful the operation, until you get a special lens for it. Your old glasses will not be suited to the new eye that was operated on and you will not be ready for a new lens for about a month.  10. [facility].    I have received a copy of the above instructions and will follow them.      Follow up today with [name] today between 2-3 pm.

## 2014-03-14 NOTE — Anesthesia Procedure Notes (Signed)
Procedure Name: MAC Date/Time: 03/14/2014 7:56 AM Performed by: Pernell DupreADAMS, AMY A Pre-anesthesia Checklist: Patient identified, Timeout performed, Emergency Drugs available, Suction available and Patient being monitored Oxygen Delivery Method: Nasal cannula

## 2014-03-15 ENCOUNTER — Encounter (HOSPITAL_COMMUNITY): Payer: Self-pay | Admitting: Ophthalmology

## 2014-06-06 ENCOUNTER — Ambulatory Visit (INDEPENDENT_AMBULATORY_CARE_PROVIDER_SITE_OTHER): Payer: Medicare Other | Admitting: Orthopedic Surgery

## 2014-06-06 ENCOUNTER — Ambulatory Visit (INDEPENDENT_AMBULATORY_CARE_PROVIDER_SITE_OTHER): Payer: Medicare Other

## 2014-06-06 ENCOUNTER — Encounter: Payer: Self-pay | Admitting: Orthopedic Surgery

## 2014-06-06 VITALS — BP 121/77 | Ht 70.5 in | Wt 191.0 lb

## 2014-06-06 DIAGNOSIS — M545 Low back pain: Secondary | ICD-10-CM

## 2014-06-06 DIAGNOSIS — M25551 Pain in right hip: Secondary | ICD-10-CM

## 2014-06-06 DIAGNOSIS — M25552 Pain in left hip: Secondary | ICD-10-CM

## 2014-06-06 MED ORDER — IBUPROFEN 800 MG PO TABS: 800.0000 mg | ORAL_TABLET | Freq: Three times a day (TID) | ORAL | Status: DC | PRN

## 2014-06-06 MED ORDER — HYDROCODONE-ACETAMINOPHEN 5-325 MG PO TABS: 1.0000 | ORAL_TABLET | ORAL | Status: DC | PRN

## 2014-06-06 NOTE — Progress Notes (Signed)
Patient ID: [name], male   DOB: [birth year], 80 y.o.   MRN: [id number] Subjective:   Chief Complaint  Patient presents with  . Hip Pain    bilateral hip pain     [name] is a 80 y.o. male self-referred for evaluation and treatment of 3 weeks of  low back pain. He complains of 8 out of 10 constant aching pain in both hips which is actually in his lower back and buttocks without radiation no previous treatment. Ibuprofen hydrocodone has been taking for pain. Worse when he gets up or moves off of a chair  Review of systems negative except for stiff joints and joint pain. Cervically genitourinary and gastrointestinal review of systems negative no fever chills or history of cancer.  Outside reports reviewed: none.  The following portions of the patient's history were reviewed and updated as appropriate: allergies, current medications, past family history, past medical history, past social history, past surgical history and problem list.  Review of Systems Pertinent items are noted in HPI.    Objective:     General :    alert  Gait: Slowed gait slightly flexed posture and flexed knees to balance in the sagittal plane  Tenderness:   bilateral flank muscles, bilateral paralumbar, bilateral iliac crest, bilateral sacroiliac, bilateral gluteal  Edema:   absent.  Back ROM:  leg lengths equal with no atrophy noted, flexion 30, extension +10, lateral rotation 10/10, lateral bending 10/10  Extension:   +10           Atrophy:    is absent  Pulses:  2+ and symmetric  Strength:  abductor 5/5; quadraceps 5/5; hamstrings 5/5; adductors 5/5; iliopsoas 5/5  Straight Leg Raise:  no pain is reproduced but he has extremely tight hamstrings  Patellar Reflexes:  1+ bilaterally  Ankle Reflexes:  1+ bilaterally   Imaging Pelvic x-ray independently review no evidence of hip arthritis Lumbar spine independently reviewed possible spondylolysis and listhesis at L5-S1        Assessment:    Low back pain, 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  . Bilateral hip pain Yes  . Low back pain, unspecified back pain laterality, with sciatica presence unspecified    Orders Placed This Encounter  Procedures  . DG Pelvis 1-2 Views    Standing Status: Future     Number of Occurrences: 1     Standing Expiration Date: 08/07/2015    Order Specific Question:  Reason for Exam (SYMPTOM  OR DIAGNOSIS REQUIRED)    Answer:  bilateral hip pain    Order Specific Question:  Preferred imaging location?    Answer:  External  . DG Lumbar Spine 2-3 Views    Standing Status: Future     Number of Occurrences: 1     Standing Expiration Date: 08/07/2015    Order Specific Question:  Reason for Exam (SYMPTOM  OR DIAGNOSIS REQUIRED)    Answer:  low back pain    Order Specific Question:  Preferred imaging location?    Answer:  External  . Ambulatory referral to Physical Therapy    Referral Priority:  Routine    Referral Type:  Physical Medicine    Referral Reason:  Specialty Services Required    Requested Specialty:  Physical Therapy    Number of Visits Requested:  1   Meds ordered this encounter  Medications  . ibuprofen (ADVIL,MOTRIN) 800 MG tablet  Sig: Take 1 tablet (800 mg total) by mouth every 8 (eight) hours as needed.    Dispense:  90 tablet    Refill:  0  . HYDROcodone-acetaminophen (NORCO/VICODIN) 5-325 MG per tablet    Sig: Take 1 tablet by mouth every 4 (four) hours as needed for moderate pain.    Dispense:  60 tablet    Refill:  0

## 2014-06-22 ENCOUNTER — Encounter (HOSPITAL_COMMUNITY): Payer: Self-pay | Admitting: Physical Therapy

## 2014-06-22 ENCOUNTER — Ambulatory Visit (HOSPITAL_COMMUNITY)
Admission: RE | Admit: 2014-06-22 | Discharge: 2014-06-22 | Disposition: A | Discharge status: Home / Self Care | Payer: Medicare Other | Source: Ambulatory Visit | Attending: Orthopedic Surgery | Admitting: Orthopedic Surgery

## 2014-06-22 DIAGNOSIS — M25562 Pain in left knee: Secondary | ICD-10-CM | POA: Insufficient documentation

## 2014-06-22 DIAGNOSIS — M25561 Pain in right knee: Secondary | ICD-10-CM | POA: Diagnosis not present

## 2014-06-22 DIAGNOSIS — M2560 Stiffness of unspecified joint, not elsewhere classified: Secondary | ICD-10-CM

## 2014-06-22 DIAGNOSIS — M25552 Pain in left hip: Secondary | ICD-10-CM | POA: Insufficient documentation

## 2014-06-22 DIAGNOSIS — M25551 Pain in right hip: Secondary | ICD-10-CM | POA: Diagnosis not present

## 2014-06-22 DIAGNOSIS — R293 Abnormal posture: Secondary | ICD-10-CM

## 2014-06-22 DIAGNOSIS — Z5189 Encounter for other specified aftercare: Secondary | ICD-10-CM | POA: Diagnosis not present

## 2014-06-22 DIAGNOSIS — M545 Low back pain, unspecified: Secondary | ICD-10-CM

## 2014-06-22 DIAGNOSIS — M546 Pain in thoracic spine: Secondary | ICD-10-CM | POA: Diagnosis not present

## 2014-06-22 NOTE — Therapy (Addendum)
Physical Therapy Evaluation  Patient Details  Name: Brandon SpecterFrancis Balderson MRN: [id number] Date of Birth: 12/04/1929  Encounter Date: 06/22/2014      PT End of Session - 06/22/14 1158    Visit Number 1   Number of Visits 6   Date for PT Re-Evaluation 07/20/14   Authorization Type Medicare   Authorization - Visit Number 1   Authorization - Number of Visits 6   PT Start Time 1108   PT Stop Time 1155   PT Time Calculation (min) 47 min      Past Medical History  Diagnosis Date  . Diabetes mellitus   . Hypertension   . Hyperchloremia   . GERD (gastroesophageal reflux disease)   . Arthritis     Past Surgical History  Procedure Laterality Date  . Tonsillectomy    . Knee arthroscopy Left   . Shoulder arthroscopy with open rotator cuff repair Left 10/15/2012    Procedure: SHOULDER ARTHROSCOPY WITH OPEN ROTATOR CUFF REPAIR;  Surgeon: [name], MD;  Location: [facility];  Service: Orthopedics;  Laterality: Left;  open at 0912; with bicep tendon debridement;   . Shoulder acromioplasty Left 10/15/2012    Procedure: SHOULDER ACROMIOPLASTY;  Surgeon: [name], MD;  Location: [facility];  Service: Orthopedics;  Laterality: Left;  . Resection distal clavical Left 10/15/2012    Procedure: RESECTION DISTAL CLAVICAL;  Surgeon: [name], MD;  Location: [facility];  Service: Orthopedics;  Laterality: Left;  . Cataract extraction w/phaco Left 03/07/2014    Procedure: CATARACT EXTRACTION PHACO AND INTRAOCULAR LENS PLACEMENT LEFT EYE CDE=7.11;  Surgeon: [name], MD;  Location: [facility];  Service: Ophthalmology;  Laterality: Left;  . Cataract extraction w/phaco Right 03/14/2014    Procedure: CATARACT EXTRACTION PHACO AND INTRAOCULAR LENS PLACEMENT RIGHT EYE CDE=10.62;  Surgeon: [name], MD;  Location: [facility];  Service: Ophthalmology;  Laterality: Right;    There were no vitals taken for this visit.  Visit Diagnosis:  Bilateral low back pain without sciatica  Stiffness in  joint  Body posture problem      Subjective Assessment - 06/22/14 1128    Pertinent History Pt had polio as a child ; Lt TKR    Mr. Laren [name] states that he has radicular pain when weight bearing.  The pain is more on the Rt than the Lt but is B.  Pain level is a 7/10.   Pt has no precautions or restrictions.       [facility] - 06/22/14 1118    Assessment   Medical Diagnosis lumbar radiculopathy    Onset Date 05/05/14   Prior Therapy none   Balance Screen   Has the patient fallen in the past 6 months No   Has the patient had a decrease in activity level because of a fear of falling?  No   Is the patient reluctant to leave their home because of a fear of falling?  No   Prior Function   Level of Independence Independent with basic ADLs   Vocation Part time employment   Vocation Requirements on feet    Observation/Other Assessments   Observations stand with forward bend postiion of 10 degrees    Focus on Therapeutic Outcomes (FOTO)  foto   AROM   Lumbar Flexion decreased 50%   Lumbar Extension decrased 20%   Strength   Right Hip Flexion 5/5   Right Hip Extension 4/5   Right Hip ABduction 5/5   Left Hip Flexion  5/5   Left Hip Extension 4/5   Left Hip ABduction 5/5   Right Knee Flexion 5/5   Right Knee Extension 5/5   Left Knee Flexion 5/5   Left Knee Extension 5/5   Right Ankle Dorsiflexion 5/5   Left Ankle Dorsiflexion 4/5   Flexibility   Hamstrings tight especially on Rt   Piriformis tght especially on Rt   Quadratus Lumborum tight especially on Rt           [facility] Adult PT Treatment/Exercise - Jul 02, 2014 1131    Exercises   Exercises Lumbar   Lumbar Exercises: Stretches   Active Hamstring Stretch 3 reps;30 seconds   Single Knee to Chest Stretch 3 reps;30 seconds   Pelvic Tilt --  scapular retraction x 10   Standing Extension Limitations 5   Piriformis Stretch 30 seconds;2 reps   Piriformis Stretch Limitations supine             PT Short Term  Goals - Jul 02, 2014 1204    PT SHORT TERM GOAL #1   Title I with hep   Time 1   Period Weeks   PT SHORT TERM GOAL #2   Title Pain to mid thigh region only   Time 1   Period Weeks   PT SHORT TERM GOAL #3   Title Pain intensity decreased to a 4/10   Time 1   Period Weeks          PT Long Term Goals - 07-02-14 1205    PT LONG TERM GOAL #1   Title I in advance HEP   Time 3   Period Weeks   PT LONG TERM GOAL #2   Title no radicular sx    Time 3   Period Weeks   PT LONG TERM GOAL #3   Title Pt to be able to sit for an hour without having buttock pain   Time 3   Period Weeks   PT LONG TERM GOAL #4   Title Pt pain down to no greater than a 2/10 80% of the time   Time 3   Period Weeks   PT LONG TERM GOAL #5   Title Pt to be able to lift his let into his truck    Time 3   Period Weeks          Plan - 07-02-2014 1159    Clinical Impression Statement PT is an 80 yo male who began having radicular back pain approximately five weeks ago.  He has been referred to physical therapy.  Examination demonstrates tightness in almost all  back, hip and knee mm.  Pt strength is very good for his age.  Pt will benefit from skilled therapy for stretching and mobility exercises for thoracic, lumbar, hip and knee area.  Pt will only be seen twice a week secondary to pt financial concern due to having a $40.00 co pay.    Pt will benefit from skilled therapeutic intervention in order to improve on the following deficits Pain;Decreased range of motion;Increased muscle spasms;Impaired flexibility   Rehab Potential Good   PT Frequency 2x / week   PT Duration 3 weeks   PT Treatment/Interventions Therapeutic exercise;Therapeutic activities;Manual techniques   PT Next Visit Plan begin sitting piriformis stretching, 3-D thoracic and hip excursion, 3-way hamstring stretch,  quadricep stretch, POE    PT Home Exercise Plan given           G-Codes - 07-02-2014 1210    Functional Assessment Tool  Used foto    Functional Limitation Changing and maintaining body position   Changing and Maintaining Body Position Current Status [phone]([id number]) At least 60 percent but less than 80 percent impaired, limited or restricted   Changing and Maintaining Body Position Goal Status ([id number]) At least 40 percent but less than 60 percent impaired, limited or restricted      Problem List Patient Active Problem List   Diagnosis Date Noted  . Rotator cuff tear 01/04/2013  . S/P rotator cuff repair 01/04/2013  . Arthritis, shoulder region 10/15/2012  . Biceps tendonitis on left 10/15/2012  . Adhesive bursitis of left shoulder 09/01/2012  . Partial tear of rotator cuff 09/01/2012  . Pain in joint, shoulder region 07/09/2011  . Muscle weakness (generalized) 07/09/2011  . Bursitis of shoulder, left 07/03/2011        Donnamae JudeCindy Mikael Debell PT/CLT [phone]    06/22/2014, 12:11 PM

## 2014-06-22 NOTE — Patient Instructions (Signed)
Knee-to-Chest Stretch: Unilateral   With hand behind right knee, pull knee in to chest until a comfortable stretch is felt in lower back and buttocks. Keep back relaxed. Hold ____ seconds. Repeat ____ times per set. Do ____ sets per session. Do ____ sessions per day.  http://orth.exer.us/126   Copyright  VHI. All rights reserved.  Piriformis (Supine)   Cross legs, right on top. Gently pull other knee toward chest until stretch is felt in buttock/hip of top leg. Hold ____ seconds. Repeat ____ times per set. Do ____ sets per session. Do ____ sessions per day.  http://orth.exer.us/676   Copyright  VHI. All rights reserved.  Hamstring Stretch: Active   Support behind right knee. Starting with knee bent, attempt to straighten knee until a comfortable stretch is felt in back of thigh. Hold ____ seconds. Repeat ____ times per set. Do ____ sets per session. Do ____ sessions per day.  http://orth.exer.us/158   Copyright  VHI. All rights reserved.  Backward Bend (Standing)   Arch backward to make hollow of back deeper. Hold ____ seconds. Repeat ____ times per set. Do ____ sets per session. Do ____ sessions per day.  http://orth.exer.us/178   Copyright  VHI. All rights reserved.  Scapular Retraction (Standing)   With arms at sides, pinch shoulder blades together. Repeat ____ times per set. Do ____ sets per session. Do ____ sessions per day.  http://orth.exer.us/944   Copyright  VHI. All rights reserved.

## 2014-06-27 ENCOUNTER — Ambulatory Visit (HOSPITAL_COMMUNITY)
Admission: RE | Admit: 2014-06-27 | Discharge: 2014-06-27 | Disposition: A | Discharge status: Home / Self Care | Payer: Medicare Other | Source: Ambulatory Visit | Attending: Orthopedic Surgery | Admitting: Orthopedic Surgery

## 2014-06-27 DIAGNOSIS — M25519 Pain in unspecified shoulder: Secondary | ICD-10-CM

## 2014-06-27 DIAGNOSIS — M6281 Muscle weakness (generalized): Secondary | ICD-10-CM

## 2014-06-27 DIAGNOSIS — R293 Abnormal posture: Secondary | ICD-10-CM

## 2014-06-27 DIAGNOSIS — Z5189 Encounter for other specified aftercare: Secondary | ICD-10-CM | POA: Diagnosis not present

## 2014-06-27 DIAGNOSIS — M545 Low back pain, unspecified: Secondary | ICD-10-CM

## 2014-06-27 DIAGNOSIS — M2560 Stiffness of unspecified joint, not elsewhere classified: Secondary | ICD-10-CM

## 2014-06-27 NOTE — Therapy (Signed)
Physical Therapy Treatment  Patient Details  Name: [name] MRN: [id number] Date of Birth: [birth year]  Encounter Date: 06/27/2014      PT End of Session - 06/27/14 1153    Visit Number 2   Number of Visits 6   Date for PT Re-Evaluation 07/20/14   Authorization Type Medicare   Authorization - Visit Number 2   Authorization - Number of Visits 6   PT Start Time 1102   PT Stop Time 1142   PT Time Calculation (min) 40 min   Activity Tolerance Patient tolerated treatment well   Behavior During Therapy [facility] for tasks assessed/performed      Past Medical History  Diagnosis Date  . Diabetes mellitus   . Hypertension   . Hyperchloremia   . GERD (gastroesophageal reflux disease)   . Arthritis     Past Surgical History  Procedure Laterality Date  . Tonsillectomy    . Knee arthroscopy Left   . Shoulder arthroscopy with open rotator cuff repair Left 10/15/2012    Procedure: SHOULDER ARTHROSCOPY WITH OPEN ROTATOR CUFF REPAIR;  Surgeon: [name], MD;  Location: [facility];  Service: Orthopedics;  Laterality: Left;  open at 0912; with bicep tendon debridement;   . Shoulder acromioplasty Left 10/15/2012    Procedure: SHOULDER ACROMIOPLASTY;  Surgeon: [name], MD;  Location: [facility];  Service: Orthopedics;  Laterality: Left;  . Resection distal clavical Left 10/15/2012    Procedure: RESECTION DISTAL CLAVICAL;  Surgeon: [name], MD;  Location: [facility];  Service: Orthopedics;  Laterality: Left;  . Cataract extraction w/phaco Left 03/07/2014    Procedure: CATARACT EXTRACTION PHACO AND INTRAOCULAR LENS PLACEMENT LEFT EYE CDE=7.11;  Surgeon: [name], MD;  Location: [facility];  Service: Ophthalmology;  Laterality: Left;  . Cataract extraction w/phaco Right 03/14/2014    Procedure: CATARACT EXTRACTION PHACO AND INTRAOCULAR LENS PLACEMENT RIGHT EYE CDE=10.62;  Surgeon: [name], MD;  Location: [facility];  Service: Ophthalmology;  Laterality: Right;    There  were no vitals taken for this visit.  Visit Diagnosis:  Bilateral low back pain without sciatica  Stiffness in joint  Body posture problem  Pain in joint, shoulder region, unspecified laterality  Muscle weakness (generalized)      Subjective Assessment - 06/27/14 1146    Symptoms Pt reports he is sore from the new exericses, however reports compliance with HEP.  Reports he has most pain with transitioning from sit to stand.  States he works at [employer] part time.   Currently in Pain? Yes   Pain Score 7    Pain Location Back   Pain Orientation Lower   Pain Descriptors / Indicators Aching   Pain Radiating Towards Bilateral LE's to knee level   Pain Onset More than a month ago   Pain Frequency Intermittent            [facility] Adult PT Treatment/Exercise - 06/27/14 1148    Exercises   Exercises Lumbar   Lumbar Exercises: Stretches   Active Hamstring Stretch 3 reps;30 seconds;Limitations   Active Hamstring Stretch Limitations standing with 14" box    Passive Hamstring Stretch 2 reps;30 seconds   Passive Hamstring Stretch Limitations supine   Lower Trunk Rotation 5 reps;10 seconds   Prone on Elbows Stretch 60 seconds;1 rep   Piriformis Stretch 1 rep;60 seconds;Limitations   Piriformis Stretch Limitations seated and PROM   Lumbar Exercises: Standing   Other Standing Lumbar Exercises hip excursion 10 reps each  Lumbar Exercises: Seated   Other Seated Lumbar Exercises thoracic excursion arms crossed 10 reps   Lumbar Exercises: Supine   Straight Leg Raise 10 reps   Lumbar Exercises: Prone   Straight Leg Raise 10 reps   Other Prone Lumbar Exercises Rt hip IR stretch, Lt hip ER stretch   Manual Therapy   Manual Therapy Passive ROM   Passive ROM hamstring, piriformis, hip IR/ER            PT Short Term Goals - 06/27/14 1158    PT SHORT TERM GOAL #1   Title I with hep   Time 1   Period Weeks   PT SHORT TERM GOAL #2   Title Pain to mid thigh region only   Time 1    Period Weeks   PT SHORT TERM GOAL #3   Title Pain intensity decreased to a 4/10   Time 1   Period Weeks          PT Long Term Goals - 06/27/14 1158    PT LONG TERM GOAL #1   Title I in advance HEP   Time 3   Period Weeks   PT LONG TERM GOAL #2   Title no radicular sx    Time 3   Period Weeks   PT LONG TERM GOAL #3   Title Pt to be able to sit for an hour without having buttock pain   Time 3   Period Weeks   PT LONG TERM GOAL #4   Title Pt pain down to no greater than a 2/10 80% of the time   Time 3   Period Weeks   PT LONG TERM GOAL #5   Title Pt to be able to lift his let into his truck    Time 3   Period Weeks          Plan - 06/27/14 1154    Clinical Impression Statement Progressed POC per PT plan.  PT with noted tightness in hip rotators, piriformis and hamstrings.  Pt also with c/o bilateral shoulder pain and may benefirt from OT consult (will discuss with evaluating therapist).  Manual completed to get deeper stretch of LE musculature.  Pt without c/o pain at end of session.   PT Next Visit Plan Continue to progress towards goals per therapist POC.  Inquire regarding OT consult for bilateral shoulder pain.        Problem List Patient Active Problem List   Diagnosis Date Noted  . Rotator cuff tear 01/04/2013  . S/P rotator cuff repair 01/04/2013  . Arthritis, shoulder region 10/15/2012  . Biceps tendonitis on left 10/15/2012  . Adhesive bursitis of left shoulder 09/01/2012  . Partial tear of rotator cuff 09/01/2012  . Pain in joint, shoulder region 07/09/2011  . Muscle weakness (generalized) 07/09/2011  . Bursitis of shoulder, left 07/03/2011       Lurena Nidamy B Frazier, PTA/CLT [phone] 06/27/2014, 11:59 AM

## 2014-07-03 NOTE — Addendum Note (Signed)
Encounter addended by: Bella Kennedyynthia J Russell, PT on: 07/03/2014 10:46 AM<BR>     Documentation filed: Clinical Notes

## 2014-07-04 ENCOUNTER — Encounter (HOSPITAL_COMMUNITY): Payer: Medicare Other | Admitting: Physical Therapy

## 2014-07-05 ENCOUNTER — Ambulatory Visit (HOSPITAL_COMMUNITY)
Admission: RE | Admit: 2014-07-05 | Discharge: 2014-07-05 | Disposition: A | Discharge status: Home / Self Care | Payer: Medicare Other | Source: Ambulatory Visit | Attending: Family Medicine | Admitting: Family Medicine

## 2014-07-05 DIAGNOSIS — M546 Pain in thoracic spine: Secondary | ICD-10-CM | POA: Diagnosis not present

## 2014-07-05 DIAGNOSIS — M545 Low back pain, unspecified: Secondary | ICD-10-CM

## 2014-07-05 DIAGNOSIS — Z5189 Encounter for other specified aftercare: Secondary | ICD-10-CM | POA: Insufficient documentation

## 2014-07-05 DIAGNOSIS — M25561 Pain in right knee: Secondary | ICD-10-CM | POA: Insufficient documentation

## 2014-07-05 DIAGNOSIS — M25551 Pain in right hip: Secondary | ICD-10-CM | POA: Insufficient documentation

## 2014-07-05 DIAGNOSIS — R293 Abnormal posture: Secondary | ICD-10-CM

## 2014-07-05 DIAGNOSIS — M6281 Muscle weakness (generalized): Secondary | ICD-10-CM

## 2014-07-05 DIAGNOSIS — M2560 Stiffness of unspecified joint, not elsewhere classified: Secondary | ICD-10-CM

## 2014-07-05 DIAGNOSIS — M25552 Pain in left hip: Secondary | ICD-10-CM | POA: Insufficient documentation

## 2014-07-05 DIAGNOSIS — M25562 Pain in left knee: Secondary | ICD-10-CM | POA: Insufficient documentation

## 2014-07-05 DIAGNOSIS — M25519 Pain in unspecified shoulder: Secondary | ICD-10-CM

## 2014-07-05 NOTE — Addendum Note (Signed)
Encounter addended by: Lurena NidaAmy B Frazier, PTA on: 07/05/2014  1:11 PM<BR>     Documentation filed: Clinical Notes, Inpatient Document Flowsheet

## 2014-07-05 NOTE — Therapy (Addendum)
[facility] [address] [place], [place], [address] Phone: [phone]   Fax:  [phone]  Physical Therapy Treatment  Patient Details  Name: [name] MRN: [id number] Date of Birth: May 29, 1930  Encounter Date: 07/05/2014      PT End of Session - 07/05/14 1156    Visit Number 3   Number of Visits 6   Date for PT Re-Evaluation 07/20/14   Authorization Type Medicare   Authorization - Visit Number 3   Authorization - Number of Visits 6   PT Start Time 1055   PT Stop Time 1142   PT Time Calculation (min) 47 min   Activity Tolerance Patient tolerated treatment well   Behavior During Therapy [facility] for tasks assessed/performed      Past Medical History  Diagnosis Date  . Diabetes mellitus   . Hypertension   . Hyperchloremia   . GERD (gastroesophageal reflux disease)   . Arthritis     Past Surgical History  Procedure Laterality Date  . Tonsillectomy    . Knee arthroscopy Left   . Shoulder arthroscopy with open rotator cuff repair Left 10/15/2012    Procedure: SHOULDER ARTHROSCOPY WITH OPEN ROTATOR CUFF REPAIR;  Surgeon: [name], MD;  Location: [facility];  Service: Orthopedics;  Laterality: Left;  open at 0912; with bicep tendon debridement;   . Shoulder acromioplasty Left 10/15/2012    Procedure: SHOULDER ACROMIOPLASTY;  Surgeon: [name], MD;  Location: [facility];  Service: Orthopedics;  Laterality: Left;  . Resection distal clavical Left 10/15/2012    Procedure: RESECTION DISTAL CLAVICAL;  Surgeon: [name], MD;  Location: [facility];  Service: Orthopedics;  Laterality: Left;  . Cataract extraction w/phaco Left 03/07/2014    Procedure: CATARACT EXTRACTION PHACO AND INTRAOCULAR LENS PLACEMENT LEFT EYE CDE=7.11;  Surgeon: [name], MD;  Location: [facility];  Service: Ophthalmology;  Laterality: Left;  . Cataract extraction w/phaco Right 03/14/2014    Procedure: CATARACT EXTRACTION PHACO AND INTRAOCULAR LENS PLACEMENT  RIGHT EYE CDE=10.62;  Surgeon: [name], MD;  Location: [facility];  Service: Ophthalmology;  Laterality: Right;    There were no vitals taken for this visit.  Visit Diagnosis:  Stiffness in joint  Bilateral low back pain without sciatica  Body posture problem  Pain in joint, shoulder region, unspecified laterality  Muscle weakness (generalized)      Subjective Assessment - 07/05/14 1216    Symptoms Wife reports patient has been completing his exercises in the floor.  States his shoulder bothers him as bad as his hips.  currently wtih dull ache/pain in hips.   Currently in Pain? Yes   Pain Score 5    Pain Location Hip   Pain Orientation Right;Left   Pain Descriptors / Indicators Aching            [facility] Adult PT Treatment/Exercise - 07/05/14 1102    Lumbar Exercises: Stretches   Active Hamstring Stretch 3 reps;30 seconds;Limitations   Active Hamstring Stretch Limitations standing with 14" box    Passive Hamstring Stretch 2 reps;30 seconds   Lumbar Exercises: Standing   Functional Squats 10 reps   Forward Lunge 10 reps;Limitations   Forward Lunge Limitations onto 6" step with UE assist   Other Standing Lumbar Exercises tandem gait, retro gait, SLS on 4" box  with A/P toe tapping 10 reps each   Other Standing Lumbar Exercises SLS max of 3 no UE's Rt:  6", LT: 10"   Lumbar Exercises: Seated  Sit to Stand 10 reps;Limitations   Sit to Stand Limitations from edge of mat no UE's   Lumbar Exercises: Supine   Straight Leg Raise 10 reps   Other Supine Lumbar Exercises PROM hamstring and piriformis   Lumbar Exercises: Sidelying   Hip Abduction 10 reps;Limitations   Hip Abduction Limitations 2 sets bilaterally   Lumbar Exercises: Prone   Straight Leg Raise 10 reps   Straight Leg Raises Limitations 2 sets bilaterally   Other Prone Lumbar Exercises Rt hip IR stretch, Lt hip ER, quad stretch   Manual Therapy   Manual Therapy Passive ROM   Passive ROM HS, piriformis, quad,  hip ER/IR            PT Short Term Goals - 07/05/14 1213    PT SHORT TERM GOAL #1   Title I with hep   Time 1   Period Weeks   PT SHORT TERM GOAL #2   Title Pain to mid thigh region only   Time 1   Period Weeks   PT SHORT TERM GOAL #3   Title Pain intensity decreased to a 4/10   Time 1   Period Weeks          PT Long Term Goals - 07/05/14 1214    PT LONG TERM GOAL #1   Title I in advance HEP   Time 3   Period Weeks   PT LONG TERM GOAL #2   Title no radicular sx    Time 3   Period Weeks   PT LONG TERM GOAL #3   Title Pt to be able to sit for an hour without having buttock pain   Time 3   Period Weeks   PT LONG TERM GOAL #4   Title Pt pain down to no greater than a 2/10 80% of the time   Time 3   Period Weeks   PT LONG TERM GOAL #5   Title Pt to be able to lift his let into his truck    Time 3   Period Weeks          Plan - 07/05/14 1207    Clinical Impression Statement Continued with focus on increasing stabiltiy and improving flexibility to decrease pain.  Progressed with SLS actvities to activate glute musculature and added balance actviteis as patient reports his balance is deficient. Upon discussion with wife regarding insurance, decided to add shoulder treatment to current therapy rather than obtaining separate OT consult.  Order faxed to MD requesting addition of shoulder.   PT Next Visit Plan Continue to progress towards goals per therapist POC.  Have evaluating therapist evaluate shoulder when order received.        Problem List Patient Active Problem List   Diagnosis Date Noted  . Rotator cuff tear 01/04/2013  . S/P rotator cuff repair 01/04/2013  . Arthritis, shoulder region 10/15/2012  . Biceps tendonitis on left 10/15/2012  . Adhesive bursitis of left shoulder 09/01/2012  . Partial tear of rotator cuff 09/01/2012  . Pain in joint, shoulder region 07/09/2011  . Muscle weakness (generalized) 07/09/2011  . Bursitis of shoulder, left  07/03/2011    Lurena Nidamy B Frazier, PTA/CLT [phone] 07/05/2014, 1:10 PM

## 2014-07-06 ENCOUNTER — Ambulatory Visit (HOSPITAL_COMMUNITY)
Admission: RE | Admit: 2014-07-06 | Discharge: 2014-07-06 | Disposition: A | Discharge status: Home / Self Care | Payer: Medicare Other | Source: Ambulatory Visit | Attending: Orthopedic Surgery | Admitting: Orthopedic Surgery

## 2014-07-06 DIAGNOSIS — M6281 Muscle weakness (generalized): Secondary | ICD-10-CM

## 2014-07-06 DIAGNOSIS — M545 Low back pain, unspecified: Secondary | ICD-10-CM

## 2014-07-06 DIAGNOSIS — Z5189 Encounter for other specified aftercare: Secondary | ICD-10-CM | POA: Diagnosis not present

## 2014-07-06 DIAGNOSIS — M2560 Stiffness of unspecified joint, not elsewhere classified: Secondary | ICD-10-CM

## 2014-07-06 DIAGNOSIS — M25519 Pain in unspecified shoulder: Secondary | ICD-10-CM

## 2014-07-06 DIAGNOSIS — R293 Abnormal posture: Secondary | ICD-10-CM

## 2014-07-06 NOTE — Therapy (Signed)
[facility] [address] [place], [place], [address] Phone: [phone]   Fax:  [phone]  Physical Therapy Treatment  Patient Details  Name: Brandon SpecterFrancis Ludke MRN: [id number] Date of Birth: 1930/04/21  Encounter Date: 07/06/2014      PT End of Session - 07/06/14 0928    Visit Number 4   Number of Visits 6   Date for PT Re-Evaluation 07/20/14   Authorization Type Medicare   Authorization - Visit Number 4   Authorization - Number of Visits 6   PT Start Time 0845   PT Stop Time 0930   PT Time Calculation (min) 45 min   Activity Tolerance Patient tolerated treatment well   Behavior During Therapy [facility] tasks assessed/performed      Past Medical History  Diagnosis Date  . Diabetes mellitus   . Hypertension   . Hyperchloremia   . GERD (gastroesophageal reflux disease)   . Arthritis     Past Surgical History  Procedure Laterality Date  . Tonsillectomy    . Knee arthroscopy Left   . Shoulder arthroscopy with open rotator cuff repair Left 10/15/2012    Procedure: SHOULDER ARTHROSCOPY WITH OPEN ROTATOR CUFF REPAIR;  Surgeon: [name], MD;  Location: [facility];  Service: Orthopedics;  Laterality: Left;  open at 0912; with bicep tendon debridement;   . Shoulder acromioplasty Left 10/15/2012    Procedure: SHOULDER ACROMIOPLASTY;  Surgeon: [name], MD;  Location: [facility];  Service: Orthopedics;  Laterality: Left;  . Resection distal clavical Left 10/15/2012    Procedure: RESECTION DISTAL CLAVICAL;  Surgeon: [name], MD;  Location: [facility];  Service: Orthopedics;  Laterality: Left;  . Cataract extraction w/phaco Left 03/07/2014    Procedure: CATARACT EXTRACTION PHACO AND INTRAOCULAR LENS PLACEMENT LEFT EYE CDE=7.11;  Surgeon: [name], MD;  Location: [facility];  Service: Ophthalmology;  Laterality: Left;  . Cataract extraction w/phaco Right 03/14/2014    Procedure: CATARACT EXTRACTION PHACO AND INTRAOCULAR LENS PLACEMENT  RIGHT EYE CDE=10.62;  Surgeon: [name], MD;  Location: [facility];  Service: Ophthalmology;  Laterality: Right;    There were no vitals taken for this visit.  Visit Diagnosis:  Stiffness in joint  Bilateral low back pain without sciatica  Body posture problem  Pain in joint, shoulder region, unspecified laterality  Muscle weakness (generalized)      Subjective Assessment - 07/05/14 1216    Symptoms Wife reports patient has been completing his exercises in the floor.  States his shoulder bothers him as bad as his hips.  currently wtih dull ache/pain in hips.   Currently in Pain? Yes   Pain Score 5    Pain Location Hip   Pain Orientation Right;Left   Pain Descriptors / Indicators Aching            [facility] Adult PT Treatment/Exercise - 07/06/14 0932    Lumbar Exercises: Stretches   Active Hamstring Stretch 3 reps;20 seconds;Limitations   Active Hamstring Stretch Limitations on 14" step   Lumbar Exercises: Aerobic   Stationary Bike nustep level 2 hills #3 8 minutes LE only   Lumbar Exercises: Standing   Other Standing Lumbar Exercises gastroc stretch 3X20" slantboard   Other Standing Lumbar Exercises rockerboard A/P, R/L 2 minutes each   Balance Exercises   Sidestepping 1 rep   Sidestepping Limitations sumo squat walk   Tandem Walking 2 round trips   Tandem Walking Limitations fwd and backward   March on Foam/Wedge 10  reps  on airex   SLS Eyes open;3 reps   SLS Limitations rt: 5", Lt:7"   Tandem Stance Eyes open;20 secs   Tandem Stance Limitations R/L lead no UE's   Heel Raises Limitations 1RT heel walk   Toe Raise Limitations 1RT toe walk            PT Short Term Goals - 07/06/14 0939    PT SHORT TERM GOAL #1   Title I with hep   Time 1   Period Weeks   PT SHORT TERM GOAL #2   Title Pain to mid thigh region only   Time 1   Period Weeks   PT SHORT TERM GOAL #3   Title Pain intensity decreased to a 4/10   Time 1   Period Weeks          PT Long  Term Goals - 07/06/14 [id number]    PT LONG TERM GOAL #1   Title I in advance HEP   Time 3   Period Weeks   PT LONG TERM GOAL #2   Title no radicular sx    Time 3   Period Weeks   PT LONG TERM GOAL #3   Title Pt to be able to sit for an hour without having buttock pain   Time 3   Period Weeks   PT LONG TERM GOAL #4   Title Pt pain down to no greater than a 2/10 80% of the time   Time 3   Period Weeks   PT LONG TERM GOAL #5   Title Pt to be able to lift his let into his truck    Time 3   Period Weeks          Plan - 07/06/14 [id number]    Clinical Impression Statement Focused today on balance actvities as patient requested additional work for stability.  Added new activities with noted improvment in tandem gait today.  Pt without LOB, however noted instability.  PT encouraged to work on SLS at home at sink as well as cointinuing all other established exercises.    PT Next Visit Plan Continue to progress towards goals per therapist POC.  Have evaluating therapist evaluate shoulder when order received.  Add balance beam next visit.            Problem List Patient Active Problem List   Diagnosis Date Noted  . Rotator cuff tear 01/04/2013  . S/P rotator cuff repair 01/04/2013  . Arthritis, shoulder region 10/15/2012  . Biceps tendonitis on left 10/15/2012  . Adhesive bursitis of left shoulder 09/01/2012  . Partial tear of rotator cuff 09/01/2012  . Pain in joint, shoulder region 07/09/2011  . Muscle weakness (generalized) 07/09/2011  . Bursitis of shoulder, left 07/03/2011    Lurena Nidamy B Frazier, PTA/CLT [phone] 07/06/2014, 9:40 AM

## 2014-07-07 ENCOUNTER — Encounter (HOSPITAL_COMMUNITY): Payer: Medicare Other | Admitting: Physical Therapy

## 2014-07-10 ENCOUNTER — Ambulatory Visit (HOSPITAL_COMMUNITY)
Admission: RE | Admit: 2014-07-10 | Discharge: 2014-07-10 | Disposition: A | Discharge status: Home / Self Care | Payer: Medicare Other | Source: Ambulatory Visit | Attending: Orthopedic Surgery | Admitting: Orthopedic Surgery

## 2014-07-10 ENCOUNTER — Encounter (HOSPITAL_COMMUNITY): Payer: Medicare Other

## 2014-07-10 DIAGNOSIS — M2560 Stiffness of unspecified joint, not elsewhere classified: Secondary | ICD-10-CM

## 2014-07-10 DIAGNOSIS — R293 Abnormal posture: Secondary | ICD-10-CM

## 2014-07-10 DIAGNOSIS — Z5189 Encounter for other specified aftercare: Secondary | ICD-10-CM | POA: Diagnosis not present

## 2014-07-10 DIAGNOSIS — M545 Low back pain, unspecified: Secondary | ICD-10-CM

## 2014-07-10 DIAGNOSIS — M25519 Pain in unspecified shoulder: Secondary | ICD-10-CM

## 2014-07-10 DIAGNOSIS — M6281 Muscle weakness (generalized): Secondary | ICD-10-CM

## 2014-07-10 NOTE — Therapy (Signed)
[facility] [address] [place], [place], [address] Phone: [phone]   Fax:  [phone]  Physical Therapy Treatment  Patient Details  Name: [name] MRN: [id number] Date of Birth: Jan 13, 1930  Encounter Date: 07/10/2014      PT End of Session - 07/10/14 1224    Visit Number 5   Number of Visits 6   Date for PT Re-Evaluation 07/20/14   Authorization Type Medicare   Authorization - Visit Number 5   Authorization - Number of Visits 6   PT Start Time 1140   PT Stop Time 1222   PT Time Calculation (min) 42 min   Activity Tolerance Patient tolerated treatment well   Behavior During Therapy [facility] for tasks assessed/performed      Past Medical History  Diagnosis Date  . Diabetes mellitus   . Hypertension   . Hyperchloremia   . GERD (gastroesophageal reflux disease)   . Arthritis     Past Surgical History  Procedure Laterality Date  . Tonsillectomy    . Knee arthroscopy Left   . Shoulder arthroscopy with open rotator cuff repair Left 10/15/2012    Procedure: SHOULDER ARTHROSCOPY WITH OPEN ROTATOR CUFF REPAIR;  Surgeon: [name], MD;  Location: [facility];  Service: Orthopedics;  Laterality: Left;  open at 0912; with bicep tendon debridement;   . Shoulder acromioplasty Left 10/15/2012    Procedure: SHOULDER ACROMIOPLASTY;  Surgeon: [name], MD;  Location: [facility];  Service: Orthopedics;  Laterality: Left;  . Resection distal clavical Left 10/15/2012    Procedure: RESECTION DISTAL CLAVICAL;  Surgeon: [name], MD;  [facility]: [facility];  Service: Orthopedics;  Laterality: Left;  . Cataract extraction w/phaco Left 03/07/2014    Procedure: CATARACT EXTRACTION PHACO AND INTRAOCULAR LENS PLACEMENT LEFT EYE CDE=7.11;  Surgeon: [name], MD;  Location: [facility];  Service: Ophthalmology;  Laterality: Left;  . Cataract extraction w/phaco Right 03/14/2014    Procedure: CATARACT EXTRACTION PHACO AND INTRAOCULAR LENS PLACEMENT  RIGHT EYE CDE=10.62;  Surgeon: [name], MD;  Location: [facility];  Service: Ophthalmology;  Laterality: Right;    There were no vitals taken for this visit.  Visit Diagnosis:  Stiffness in joint  Bilateral low back pain without sciatica  Body posture problem  Pain in joint, shoulder region, unspecified laterality  Muscle weakness (generalized)          [facility] Adult PT Treatment/Exercise - 07/10/14 1158    Lumbar Exercises: Stretches   Active Hamstring Stretch 3 reps;20 seconds;Limitations   Active Hamstring Stretch Limitations on 14" step   Piriformis Stretch 1 rep;60 seconds;Limitations   Piriformis Stretch Limitations seated and PROM   Lumbar Exercises: Aerobic   Stationary Bike nustep level 2 hills #3 8 minutes LE only   Lumbar Exercises: Standing   Functional Squats 15 reps   Forward Lunge 10 reps;Limitations   Forward Lunge Limitations onto 4" step with UE assist   Other Standing Lumbar Exercises gastroc stretch 3X20" slantboard, balance beam 3RT   Other Standing Lumbar Exercises rockerboard A/P, R/L 2 minutes each   Lumbar Exercises: Seated   Sit to Stand 10 reps;Limitations   Sit to Stand Limitations from edge of mat no UE's   Other Seated Lumbar Exercises thoracic excursion with UE movments 5 reps            PT Short Term Goals - 07/10/14 1158    PT SHORT TERM GOAL #1   Title I with hep  Time 1   Period Weeks   PT SHORT TERM GOAL #2   Title Pain to mid thigh region only   Time 1   Period Weeks   PT SHORT TERM GOAL #3   Title Pain intensity decreased to a 4/10   Time 1   Period Weeks          PT Long Term Goals - 07/10/14 1158    PT LONG TERM GOAL #1   Title I in advance HEP   Time 3   Period Weeks   PT LONG TERM GOAL #2   Title no radicular sx    Time 3   Period Weeks   PT LONG TERM GOAL #3   Title Pt to be able to sit for an hour without having buttock pain   Time 3   Period Weeks   PT LONG TERM GOAL #4   Title Pt pain down to  no greater than a 2/10 80% of the time   Time 3   Period Weeks   PT LONG TERM GOAL #5   Title Pt to be able to lift his let into his truck    Time 3   Period Weeks          Plan - 07/10/14 1225    Clinical Impression Statement Continued focus on LE strengthening, stretching and balance activities.  Added balance beam today and overall improved SLS time and ease with obtaining seated piriformis stretch.  Pain continues to be major limiting factor, especially with sit to stand transitional movements.    PT Next Visit Plan Have evaluating therapist evaluate shoulder when order received.  Re-evaluate for lumbar next visit.                               Problem List Patient Active Problem List   Diagnosis Date Noted  . Rotator cuff tear 01/04/2013  . S/P rotator cuff repair 01/04/2013  . Arthritis, shoulder region 10/15/2012  . Biceps tendonitis on left 10/15/2012  . Adhesive bursitis of left shoulder 09/01/2012  . Partial tear of rotator cuff 09/01/2012  . Pain in joint, shoulder region 07/09/2011  . Muscle weakness (generalized) 07/09/2011  . Bursitis of shoulder, left 07/03/2011    Lurena Nidamy B Kwanza Cancelliere, PTA/CLT [phone] 07/10/2014, 12:28 PM

## 2014-07-12 ENCOUNTER — Encounter (HOSPITAL_COMMUNITY): Payer: Medicare Other | Admitting: Physical Therapy

## 2014-07-12 ENCOUNTER — Telehealth: Payer: Self-pay | Admitting: Orthopedic Surgery

## 2014-07-12 ENCOUNTER — Other Ambulatory Visit: Payer: Self-pay | Admitting: *Deleted

## 2014-07-12 MED ORDER — HYDROCODONE-ACETAMINOPHEN 5-325 MG PO TABS: 1.0000 | ORAL_TABLET | ORAL | Status: DC | PRN

## 2014-07-12 NOTE — Telephone Encounter (Signed)
Patient is calling asking for a refill on pain medicaiton HYDROcodone-acetaminophen (NORCO/VICODIN) 5-325 MG per tablet please advise?

## 2014-07-12 NOTE — Telephone Encounter (Signed)
Prescription available, patient aware  

## 2014-07-13 ENCOUNTER — Ambulatory Visit (HOSPITAL_COMMUNITY)
Admission: RE | Admit: 2014-07-13 | Discharge: 2014-07-13 | Disposition: A | Discharge status: Home / Self Care | Payer: Medicare Other | Source: Ambulatory Visit | Attending: Orthopedic Surgery | Admitting: Orthopedic Surgery

## 2014-07-13 DIAGNOSIS — M545 Low back pain, unspecified: Secondary | ICD-10-CM

## 2014-07-13 DIAGNOSIS — M2560 Stiffness of unspecified joint, not elsewhere classified: Secondary | ICD-10-CM

## 2014-07-13 DIAGNOSIS — Z5189 Encounter for other specified aftercare: Secondary | ICD-10-CM | POA: Diagnosis not present

## 2014-07-13 DIAGNOSIS — R293 Abnormal posture: Secondary | ICD-10-CM

## 2014-07-13 NOTE — Therapy (Signed)
[facility] [address] [place], [place], [address] Phone: [phone]   Fax:  [phone]  Physical Therapy Reassessment Patient Details  Name: [name] MRN: [id number] Date of Birth: 1930/07/27  Encounter Date: 07/13/2014      PT End of Session - 07/13/14 0915    Visit Number 6   Number of Visits 6   Date for PT Re-Evaluation 07/20/14   Authorization Type Medicare   Authorization - Visit Number 6   Authorization - Number of Visits 6   PT Start Time 0850   PT Stop Time 0930   PT Time Calculation (min) 40 min      Past Medical History  Diagnosis Date  . Diabetes mellitus   . Hypertension   . Hyperchloremia   . GERD (gastroesophageal reflux disease)   . Arthritis     Past Surgical History  Procedure Laterality Date  . Tonsillectomy    . Knee arthroscopy Left   . Shoulder arthroscopy with open rotator cuff repair Left 10/15/2012    Procedure: SHOULDER ARTHROSCOPY WITH OPEN ROTATOR CUFF REPAIR;  Surgeon: Carole Civil, MD;  Location: [facility];  Service: Orthopedics;  Laterality: Left;  open at 0912; with bicep tendon debridement;   . Shoulder acromioplasty Left 10/15/2012    Procedure: SHOULDER ACROMIOPLASTY;  Surgeon: Carole Civil, MD;  Location: [facility];  Service: Orthopedics;  Laterality: Left;  . Resection distal clavical Left 10/15/2012    Procedure: RESECTION DISTAL CLAVICAL;  Surgeon: Carole Civil, MD;  Location: [facility];  Service: Orthopedics;  Laterality: Left;  . Cataract extraction w/phaco Left 03/07/2014    Procedure: CATARACT EXTRACTION PHACO AND INTRAOCULAR LENS PLACEMENT LEFT EYE CDE=7.11;  Surgeon: Elta Guadeloupe T. Gershon Crane, MD;  Location: [facility];  Service: Ophthalmology;  Laterality: Left;  . Cataract extraction w/phaco Right 03/14/2014    Procedure: CATARACT EXTRACTION PHACO AND INTRAOCULAR LENS PLACEMENT RIGHT EYE CDE=10.62;  Surgeon: Elta Guadeloupe T. Gershon Crane, MD;  Location: [facility];  Service: Ophthalmology;  Laterality: Right;     There were no vitals taken for this visit.  Visit Diagnosis:  Stiffness in joint  Bilateral low back pain without sciatica  Body posture problem      Subjective Assessment - 07/13/14 0853    Symptoms Pt states he can not do the exercises on his bed.  Pt states he is not any better    Pertinent History Pt had polio as a child ; Lt TKR   How long can you sit comfortably? He has pain almost immediate with sitting.   How long can you stand comfortably? Standing able to tolerate for ten minutes .     How long can you walk comfortably? Pt is able to walk without difficulty he works at [employer].   Currently in Pain? Yes   Pain Score 10-Worst pain ever   Pain Location Hip   Pain Orientation Right;Left          [facility] PT Assessment - 07/13/14 0855    Assessment   Medical Diagnosis lumbar radiculopathy    Onset Date 05/05/14   Prior Therapy none   Prior Function   Level of Independence Independent with basic ADLs   Vocation Part time employment   Vocation Requirements on feet    Observation/Other Assessments   Observations stand with forward bend postiion of 10 degrees    Focus on Therapeutic Outcomes (FOTO)  foto   AROM   Lumbar Flexion decreased 40%  was decreased 50%  Lumbar Extension decrased 20%  was decreased 20%   Strength   Right Hip Flexion 5/5   Right Hip Extension 5/5  was 4/5   Right Hip ABduction 5/5   Left Hip Flexion 5/5   Left Hip Extension 5/5  was 4/5   Left Hip ABduction 5/5   Right Knee Flexion 5/5   Right Knee Extension 5/5   Left Knee Flexion 5/5   Left Knee Extension 5/5   Right Ankle Dorsiflexion 5/5   Left Ankle Dorsiflexion 4/5  was 4/5   Flexibility   Hamstrings tight especially on Rt   Piriformis tght especially on Rt   Quadratus Lumborum tight especially on Rt           [facility] Adult PT Treatment/Exercise - 28-Jul-2014 0910    Lumbar Exercises: Stretches   Active Hamstring Stretch 3 reps;30 seconds   Active Hamstring Stretch  Limitations supine    Single Knee to Chest Stretch 3 reps;30 seconds   Double Knee to Chest Stretch 3 reps;30 seconds   Lower Trunk Rotation 5 reps   Prone on Elbows Stretch 60 seconds   Quad Stretch 3 reps;30 seconds   Piriformis Stretch 3 reps;30 seconds   Piriformis Stretch Limitations suoine   Lumbar Exercises: Standing   Other Standing Lumbar Exercises 3 D hip excursion    Lumbar Exercises: Seated   Other Seated Lumbar Exercises thoracic excursion             PT Short Term Goals - Jul 28, 2014 1629    PT SHORT TERM GOAL #1   Title I with hep   Time 1   Period Weeks   Status Achieved          PT Long Term Goals - 07-28-2014 2683    PT LONG TERM GOAL #1   Title I in advance HEP   Time 3   Period Weeks   Status Not Met   PT LONG TERM GOAL #2   Title no radicular sx    Time 3   Period Weeks   Status Not Met   PT LONG TERM GOAL #3   Title Pt to be able to sit for an hour without having buttock pain   Time 3   Period Weeks   Status Not Met   PT LONG TERM GOAL #4   Title Pt pain down to no greater than a 2/10 80% of the time   Time 3   Period Weeks   PT LONG TERM GOAL #5   Title Pt to be able to lift his let into his truck    Time 3   Period Weeks   Status Not Met          Plan - July 28, 2014 1628    Clinical Impression Statement Pt has not benefitted from therapy but pt is very tight and it has only been three weeks.  Pt was encouraged to continue HEP as pt states he is not able to afford further visits.    PT Next Visit Plan discharge patient   PT Home Exercise Plan given           G-Codes - 07/28/2014 1630    Functional Assessment Tool Used foto 61   Functional Limitation Changing and maintaining body position   Changing and Maintaining Body Position Goal Status (M1962) At least 40 percent but less than 60 percent impaired, limited or restricted   Changing and Maintaining Body Position Discharge Status (I2979) At least 40 percent but  less than 60 percent  impaired, limited or restricted        Problem List Patient Active Problem List   Diagnosis Date Noted  . Rotator cuff tear 01/04/2013  . S/P rotator cuff repair 01/04/2013  . Arthritis, shoulder region 10/15/2012  . Biceps tendonitis on left 10/15/2012  . Adhesive bursitis of left shoulder 09/01/2012  . Partial tear of rotator cuff 09/01/2012  . Pain in joint, shoulder region 07/09/2011  . Muscle weakness (generalized) 07/09/2011  . Bursitis of shoulder, left 07/03/2011   Azucena Freed PT/CLT [phone]  07/13/2014, 4:44 PM

## 2014-07-13 NOTE — Telephone Encounter (Signed)
Patient picked up Rx

## 2014-07-14 ENCOUNTER — Encounter (HOSPITAL_COMMUNITY): Payer: Medicare Other | Admitting: Physical Therapy

## 2014-07-17 ENCOUNTER — Encounter (HOSPITAL_COMMUNITY): Payer: Medicare Other | Admitting: Physical Therapy

## 2014-07-19 ENCOUNTER — Encounter (HOSPITAL_COMMUNITY): Payer: Medicare Other | Admitting: Physical Therapy

## 2014-07-20 ENCOUNTER — Ambulatory Visit (INDEPENDENT_AMBULATORY_CARE_PROVIDER_SITE_OTHER): Payer: Medicare Other | Admitting: Orthopedic Surgery

## 2014-07-20 VITALS — BP 147/87 | Ht 70.5 in | Wt 191.0 lb

## 2014-07-20 DIAGNOSIS — M48061 Spinal stenosis, lumbar region without neurogenic claudication: Secondary | ICD-10-CM

## 2014-07-20 DIAGNOSIS — M4806 Spinal stenosis, lumbar region: Secondary | ICD-10-CM

## 2014-07-20 MED ORDER — GABAPENTIN 100 MG PO CAPS: 100.0000 mg | ORAL_CAPSULE | Freq: Three times a day (TID) | ORAL | Status: DC

## 2014-07-20 NOTE — Patient Instructions (Signed)
MRI L SPINE  RESULTS BY PHONE  F/B REFERRAL TO NEUROSURGERY

## 2014-07-20 NOTE — Progress Notes (Signed)
Patient ID: [name], male   DOB: [birth year], 80 y.o.   MRN: [id number] Chief Complaint  Patient presents with  . Follow-up    follow up bilateral hip/low back pain, s/p therapy    I diagnosis patient with spinal stenosis because of his bilateral buttock pain and weakness with normal hip x-rays he comes in after therapy notes no improvement. He did take some Norco for pain that doesn't help  His symptoms include bilateral buttock pain and pain in the lower back with no further radiation. Note ibuprofen did not help as well.  Review of systems stiff joints no fever or chills  At this point and recommended an MRI of his back he should continue with his current medications in addition we will add Neurontin 100 mg 3 times a day  We will call him with the results and then refer him to Dr. Channing Muttersoy for further management

## 2014-07-21 ENCOUNTER — Encounter (HOSPITAL_COMMUNITY): Payer: Medicare Other | Admitting: Physical Therapy

## 2014-08-03 ENCOUNTER — Telehealth: Payer: Self-pay | Admitting: Orthopedic Surgery

## 2014-08-03 ENCOUNTER — Other Ambulatory Visit: Payer: Self-pay | Admitting: *Deleted

## 2014-08-03 ENCOUNTER — Ambulatory Visit (HOSPITAL_COMMUNITY)
Admission: RE | Admit: 2014-08-03 | Discharge: 2014-08-03 | Disposition: A | Discharge status: Home / Self Care | Payer: Medicare Other | Source: Ambulatory Visit | Attending: Orthopedic Surgery | Admitting: Orthopedic Surgery

## 2014-08-03 DIAGNOSIS — M79606 Pain in leg, unspecified: Secondary | ICD-10-CM | POA: Diagnosis not present

## 2014-08-03 DIAGNOSIS — M5127 Other intervertebral disc displacement, lumbosacral region: Secondary | ICD-10-CM | POA: Insufficient documentation

## 2014-08-03 DIAGNOSIS — M5126 Other intervertebral disc displacement, lumbar region: Secondary | ICD-10-CM | POA: Diagnosis not present

## 2014-08-03 DIAGNOSIS — M2578 Osteophyte, vertebrae: Secondary | ICD-10-CM | POA: Diagnosis not present

## 2014-08-03 DIAGNOSIS — M48061 Spinal stenosis, lumbar region without neurogenic claudication: Secondary | ICD-10-CM

## 2014-08-03 MED ORDER — IBUPROFEN 800 MG PO TABS: 800.0000 mg | ORAL_TABLET | Freq: Three times a day (TID) | ORAL | Status: DC | PRN

## 2014-08-03 MED ORDER — HYDROCODONE-ACETAMINOPHEN 5-325 MG PO TABS: 1.0000 | ORAL_TABLET | ORAL | Status: DC | PRN

## 2014-08-03 NOTE — Telephone Encounter (Signed)
Prescription available, patient aware  

## 2014-08-03 NOTE — Telephone Encounter (Signed)
Patient picked up prescription.

## 2014-08-03 NOTE — Telephone Encounter (Signed)
Patient requests refill on both of the following medications: HYDROcodone-acetaminophen (NORCO/VICODIN) 5-325 MG per tablet [[id number]][[id number]] and also Ibuprofen (Advil/Motrin)800mg . States uses [facility] - if Ibuprofen "can be called in."  Patient's wife spoke with our office as well - states his MRI is scheduled today, 08/03/14, and aware that Dr Romeo AppleHarrison will call with results.  Patient ph# [phone]

## 2014-08-07 ENCOUNTER — Telehealth: Payer: Self-pay | Admitting: Orthopedic Surgery

## 2014-08-07 NOTE — Telephone Encounter (Signed)
Patient is calling asking for results of recent MRI, please advise?

## 2014-08-09 NOTE — Telephone Encounter (Signed)
Routing to Dr Harrison 

## 2014-08-14 ENCOUNTER — Telehealth: Payer: Self-pay | Admitting: *Deleted

## 2014-08-14 ENCOUNTER — Encounter: Payer: Self-pay | Admitting: Orthopedic Surgery

## 2014-08-14 ENCOUNTER — Ambulatory Visit (INDEPENDENT_AMBULATORY_CARE_PROVIDER_SITE_OTHER): Payer: Medicare Other | Admitting: Orthopedic Surgery

## 2014-08-14 ENCOUNTER — Other Ambulatory Visit: Payer: Self-pay | Admitting: *Deleted

## 2014-08-14 VITALS — BP 142/79 | Ht 70.0 in | Wt 191.0 lb

## 2014-08-14 DIAGNOSIS — M75101 Unspecified rotator cuff tear or rupture of right shoulder, not specified as traumatic: Secondary | ICD-10-CM

## 2014-08-14 DIAGNOSIS — M48061 Spinal stenosis, lumbar region without neurogenic claudication: Secondary | ICD-10-CM

## 2014-08-14 DIAGNOSIS — M75102 Unspecified rotator cuff tear or rupture of left shoulder, not specified as traumatic: Secondary | ICD-10-CM

## 2014-08-14 NOTE — Telephone Encounter (Signed)
REFERRAL SENT TO DR ROY FOR SPINAL STENOSIS

## 2014-08-14 NOTE — Progress Notes (Signed)
Chief Complaint  Patient presents with  . Shoulder Problem    bilateral shoulder injections    Status post left rotator cuff repair complains of right greater than left bilateral shoulder pain no trauma  Patient wanted inject came in for injections  Procedure note the subacromial injection shoulder left   Verbal consent was obtained to inject the  Left   Shoulder  Timeout was completed to confirm the injection site is a subacromial space of the  left  shoulder  Medication used Depo-Medrol 40 mg and lidocaine 1% 3 cc  Anesthesia was provided by ethyl chloride  The injection was performed in the left  posterior subacromial space. After cleaning the skin with alcohol and anesthetized the skin with ethyl chloride the subacromial space was injected using a 20-gauge needle. There were no complications  Sterile dressing was applied.     Procedure note the subacromial injection shoulder RIGHT  Verbal consent was obtained to inject the  RIGHT   Shoulder  Timeout was completed to confirm the injection site is a subacromial space of the  RIGHT  shoulder   Medication used Depo-Medrol 40 mg and lidocaine 1% 3 cc  Anesthesia was provided by ethyl chloride  The injection was performed in the RIGHT  posterior subacromial space. After pinning the skin with alcohol and anesthetized the skin with ethyl chloride the subacromial space was injected using a 20-gauge needle. There were no complications  Sterile dressing was applied.

## 2014-08-30 NOTE — Telephone Encounter (Signed)
APPT WITH DR ROY 11/29/14 1PM, WIFE AWARE

## 2014-09-29 ENCOUNTER — Telehealth: Payer: Self-pay | Admitting: *Deleted

## 2014-09-29 NOTE — Telephone Encounter (Signed)
Patient's appointment with Dr Roy has bChanning Mutterseen moved up to 10/03/14 per Okey Regalarol who spoke with patients wife

## 2014-10-09 ENCOUNTER — Telehealth: Payer: Self-pay | Admitting: Orthopedic Surgery

## 2014-10-09 NOTE — Telephone Encounter (Signed)
Call from patient/patient's wife, to relay that patient was seen by Dr Channing Muttersoy, neurosurgeon, at an earlier date appointment that became available, 10/05/14; states he is scheduled for an epidural steroid injection with Dr Laurian Brim'Toole on 10/07/14, which Dr Channing Muttersoy recommended doing prior to any surgery.  Patient pleased with treatment plan.

## 2014-10-18 ENCOUNTER — Other Ambulatory Visit: Payer: Self-pay | Admitting: Orthopedic Surgery

## 2015-01-09 ENCOUNTER — Ambulatory Visit (INDEPENDENT_AMBULATORY_CARE_PROVIDER_SITE_OTHER): Payer: Medicare Other | Admitting: Orthopedic Surgery

## 2015-01-09 ENCOUNTER — Ambulatory Visit (INDEPENDENT_AMBULATORY_CARE_PROVIDER_SITE_OTHER): Payer: Medicare Other

## 2015-01-09 ENCOUNTER — Encounter: Payer: Self-pay | Admitting: Orthopedic Surgery

## 2015-01-09 VITALS — BP 118/66 | Ht 70.0 in | Wt 181.0 lb

## 2015-01-09 DIAGNOSIS — M4806 Spinal stenosis, lumbar region: Secondary | ICD-10-CM | POA: Diagnosis not present

## 2015-01-09 DIAGNOSIS — M25511 Pain in right shoulder: Secondary | ICD-10-CM

## 2015-01-09 DIAGNOSIS — M75101 Unspecified rotator cuff tear or rupture of right shoulder, not specified as traumatic: Secondary | ICD-10-CM | POA: Diagnosis not present

## 2015-01-09 DIAGNOSIS — M48061 Spinal stenosis, lumbar region without neurogenic claudication: Secondary | ICD-10-CM

## 2015-01-09 MED ORDER — GABAPENTIN 100 MG PO CAPS: 100.0000 mg | ORAL_CAPSULE | Freq: Three times a day (TID) | ORAL | Status: DC

## 2015-01-09 NOTE — Progress Notes (Signed)
Patient ID: [name], male   DOB: November 02, 1929, 81 y.o.   MRN: [id number] Patient ID: [name], male   DOB: 12-29-1929, 81 y.o.   MRN: [id number]  Chief Complaint  Patient presents with  . Shoulder Injury    Right shoulder pain, DOI 01-01-15.    [name] is a 81 y.o. male.   HPI 81 year old male had a left rotator cuff repair did well he also had his knee replaced in the past. He presents with pain in his right shoulder since May 30 when he was working in the garden and running his tractor. He said it's hard to turn the tractor to 1947 for tractor. After he was in the garden with the tractor he felt pain in the shoulder including pain at night.   He now complains of left shoulder pain mild weakness and mild loss of motion. At this point he's had ibuprofen 1 every 8 hours and hydrocodone 5 mg grams 1-2 every 4 hours for pain without relief  System review he wrote all negative  No drug allergies   Review of Systems See hpi   Past Medical History  Diagnosis Date  . Diabetes mellitus   . Hypertension   . Hyperchloremia   . GERD (gastroesophageal reflux disease)   . Arthritis     Past Surgical History  Procedure Laterality Date  . Tonsillectomy    . Knee arthroscopy Left   . Shoulder arthroscopy with open rotator cuff repair Left 10/15/2012    Procedure: SHOULDER ARTHROSCOPY WITH OPEN ROTATOR CUFF REPAIR;  Surgeon: Vickki Hearing, MD;  Location: [facility];  Service: Orthopedics;  Laterality: Left;  open at 0912; with bicep tendon debridement;   . Shoulder acromioplasty Left 10/15/2012    Procedure: SHOULDER ACROMIOPLASTY;  Surgeon: Vickki Hearing, MD;  Location: [facility];  Service: Orthopedics;  Laterality: Left;  . Resection distal clavical Left 10/15/2012    Procedure: RESECTION DISTAL CLAVICAL;  Surgeon: Vickki Hearing, MD;  Location: [facility];  Service: Orthopedics;  Laterality: Left;  . Cataract extraction w/phaco Left 03/07/2014    Procedure: CATARACT EXTRACTION  PHACO AND INTRAOCULAR LENS PLACEMENT LEFT EYE CDE=7.11;  Surgeon: Loraine Leriche T. Nile Riggs, MD;  Location: [facility];  Service: Ophthalmology;  Laterality: Left;  . Cataract extraction w/phaco Right 03/14/2014    Procedure: CATARACT EXTRACTION PHACO AND INTRAOCULAR LENS PLACEMENT RIGHT EYE CDE=10.62;  Surgeon: Loraine Leriche T. Nile Riggs, MD;  Location: [facility];  Service: Ophthalmology;  Laterality: Right;    Family History  Problem Relation Age of Onset  . Arthritis    . Cancer    . Diabetes      Social History History  Substance Use Topics  . Smoking status: Former Smoker -- 3.00 packs/day for 30 years    Types: Cigarettes    Quit date: 10/11/1993  . Smokeless tobacco: Not on file  . Alcohol Use: No    No Known Allergies  Current Outpatient Prescriptions  Medication Sig Dispense Refill  . aspirin EC 81 MG tablet Take 81 mg by mouth daily.    . fish oil-omega-3 fatty acids 1000 MG capsule Take 1 g by mouth 2 (two) times daily.     Marland Kitchen gabapentin (NEURONTIN) 100 MG capsule Take 1 capsule (100 mg total) by mouth 3 (three) times daily. 90 capsule 2  . glipiZIDE (GLUCOTROL) 5 MG tablet Take 5 mg by mouth 2 (two) times daily before a meal.      . HYDROcodone-acetaminophen (NORCO/VICODIN) 5-325 MG per tablet Take  1 tablet by mouth every 4 (four) hours as needed for moderate pain. 60 tablet 0  . ibuprofen (ADVIL,MOTRIN) 800 MG tablet Take 1 tablet (800 mg total) by mouth every 8 (eight) hours as needed. 90 tablet 0  . lisinopril (PRINIVIL,ZESTRIL) 40 MG tablet Take 40 mg by mouth daily.      . metFORMIN (GLUCOPHAGE) 1000 MG tablet Take 1,000 mg by mouth 2 (two) times daily with a meal.      . Multiple Vitamin (MULTIVITAMIN WITH MINERALS) TABS tablet Take 1 tablet by mouth daily.    Marland Kitchen. NIASPAN 500 MG CR tablet Take 500 mg by mouth at bedtime.     Marland Kitchen. ofloxacin (OCUFLOX) 0.3 % ophthalmic solution Place 1 drop into the left eye 4 (four) times daily.    . ONGLYZA 5 MG TABS tablet Take 5 mg by mouth daily.     .  ranitidine (ZANTAC) 150 MG capsule Take 150 mg by mouth 2 (two) times daily.     . simvastatin (ZOCOR) 80 MG tablet Take 80 mg by mouth at bedtime.       No current facility-administered medications for this visit.       Physical Exam Blood pressure 118/66, height 5\' 10"  (1.778 m), weight 181 lb (82.101 kg). Physical Exam The patient is well developed well nourished and well groomed. Orientation to person place and time is normal  Mood is pleasant. Ambulatory status he walks with a slightly flexed lumbosacral area but this is age-related Right shoulder evaluation. He has no tenderness around the shoulder except in the rotator interval. His range of motion is as follows: Flexion 150, internal rotation T12, external rotation 40. Stability tests normal abduction external rotation and inferior subluxation. Motor exam rotator cuff supraspinatus empty cans kind 5 out of 5 strength  Skin normal sensation normal vascularity normal including radial and ulnar pulse: Temperature. Lymph nodes axilla supraclavicular region normal.  Data Reviewed X-rays show normal Shenton's line of the shoulder type III acromion  Assessment Encounter Diagnoses  Name Primary?  . Pain in joint, shoulder region, right   . Spinal stenosis of lumbar region   . Rotator cuff syndrome of right shoulder Yes   Plan Subacromial injection follow as needed   Procedure note the subacromial injection shoulder RIGHT  Verbal consent was obtained to inject the  RIGHT   Shoulder  Timeout was completed to confirm the injection site is a subacromial space of the  RIGHT  shoulder   Medication used Depo-Medrol 40 mg and lidocaine 1% 3 cc  Anesthesia was provided by ethyl chloride  The injection was performed in the RIGHT  posterior subacromial space. After pinning the skin with alcohol and anesthetized the skin with ethyl chloride the subacromial space was injected using a 20-gauge needle. There were no  complications  Sterile dressing was applied.

## 2015-03-25 ENCOUNTER — Emergency Department (HOSPITAL_COMMUNITY): Payer: Medicare Other

## 2015-03-25 ENCOUNTER — Encounter (HOSPITAL_COMMUNITY): Payer: Self-pay | Admitting: Emergency Medicine

## 2015-03-25 ENCOUNTER — Inpatient Hospital Stay (HOSPITAL_COMMUNITY)
Admission: EM | Admit: 2015-03-25 | Discharge: 2015-04-02 | DRG: 683 | Disposition: A | Discharge status: Home / Self Care | Payer: Medicare Other | Attending: Family Medicine | Admitting: Family Medicine

## 2015-03-25 DIAGNOSIS — E860 Dehydration: Secondary | ICD-10-CM | POA: Diagnosis present

## 2015-03-25 DIAGNOSIS — E780 Pure hypercholesterolemia: Secondary | ICD-10-CM | POA: Diagnosis present

## 2015-03-25 DIAGNOSIS — E1140 Type 2 diabetes mellitus with diabetic neuropathy, unspecified: Secondary | ICD-10-CM | POA: Diagnosis present

## 2015-03-25 DIAGNOSIS — R748 Abnormal levels of other serum enzymes: Secondary | ICD-10-CM | POA: Diagnosis not present

## 2015-03-25 DIAGNOSIS — K529 Noninfective gastroenteritis and colitis, unspecified: Secondary | ICD-10-CM | POA: Diagnosis not present

## 2015-03-25 DIAGNOSIS — E1122 Type 2 diabetes mellitus with diabetic chronic kidney disease: Secondary | ICD-10-CM | POA: Diagnosis present

## 2015-03-25 DIAGNOSIS — Z87891 Personal history of nicotine dependence: Secondary | ICD-10-CM

## 2015-03-25 DIAGNOSIS — K5731 Diverticulosis of large intestine without perforation or abscess with bleeding: Secondary | ICD-10-CM | POA: Insufficient documentation

## 2015-03-25 DIAGNOSIS — E872 Acidosis: Secondary | ICD-10-CM | POA: Diagnosis present

## 2015-03-25 DIAGNOSIS — E876 Hypokalemia: Secondary | ICD-10-CM | POA: Diagnosis present

## 2015-03-25 DIAGNOSIS — I129 Hypertensive chronic kidney disease with stage 1 through stage 4 chronic kidney disease, or unspecified chronic kidney disease: Secondary | ICD-10-CM | POA: Diagnosis present

## 2015-03-25 DIAGNOSIS — Z794 Long term (current) use of insulin: Secondary | ICD-10-CM | POA: Diagnosis not present

## 2015-03-25 DIAGNOSIS — N182 Chronic kidney disease, stage 2 (mild): Secondary | ICD-10-CM | POA: Diagnosis present

## 2015-03-25 DIAGNOSIS — Z833 Family history of diabetes mellitus: Secondary | ICD-10-CM | POA: Diagnosis not present

## 2015-03-25 DIAGNOSIS — K5282 Eosinophilic colitis: Secondary | ICD-10-CM | POA: Diagnosis present

## 2015-03-25 DIAGNOSIS — R011 Cardiac murmur, unspecified: Secondary | ICD-10-CM

## 2015-03-25 DIAGNOSIS — Z7982 Long term (current) use of aspirin: Secondary | ICD-10-CM

## 2015-03-25 DIAGNOSIS — D72829 Elevated white blood cell count, unspecified: Secondary | ICD-10-CM | POA: Diagnosis not present

## 2015-03-25 DIAGNOSIS — R197 Diarrhea, unspecified: Secondary | ICD-10-CM

## 2015-03-25 DIAGNOSIS — Z791 Long term (current) use of non-steroidal anti-inflammatories (NSAID): Secondary | ICD-10-CM | POA: Diagnosis not present

## 2015-03-25 DIAGNOSIS — N179 Acute kidney failure, unspecified: Secondary | ICD-10-CM | POA: Diagnosis present

## 2015-03-25 DIAGNOSIS — K5730 Diverticulosis of large intestine without perforation or abscess without bleeding: Secondary | ICD-10-CM | POA: Diagnosis present

## 2015-03-25 DIAGNOSIS — M1990 Unspecified osteoarthritis, unspecified site: Secondary | ICD-10-CM | POA: Diagnosis present

## 2015-03-25 DIAGNOSIS — K219 Gastro-esophageal reflux disease without esophagitis: Secondary | ICD-10-CM | POA: Diagnosis present

## 2015-03-25 DIAGNOSIS — E119 Type 2 diabetes mellitus without complications: Secondary | ICD-10-CM | POA: Diagnosis not present

## 2015-03-25 DIAGNOSIS — K639 Disease of intestine, unspecified: Secondary | ICD-10-CM | POA: Insufficient documentation

## 2015-03-25 DIAGNOSIS — K859 Acute pancreatitis without necrosis or infection, unspecified: Secondary | ICD-10-CM

## 2015-03-25 DIAGNOSIS — K559 Vascular disorder of intestine, unspecified: Secondary | ICD-10-CM

## 2015-03-25 DIAGNOSIS — K6389 Other specified diseases of intestine: Secondary | ICD-10-CM | POA: Diagnosis not present

## 2015-03-25 HISTORY — DX: Pure hypercholesterolemia, unspecified: E78.00

## 2015-03-25 LAB — CBC WITH DIFFERENTIAL/PLATELET
BLASTS: 0 %
Band Neutrophils: 0 % (ref 0–10)
Basophils Absolute: 0.2 10*3/uL — ABNORMAL HIGH (ref 0.0–0.1)
Basophils Relative: 1 % (ref 0–1)
Eosinophils Absolute: 1.7 10*3/uL — ABNORMAL HIGH (ref 0.0–0.7)
Eosinophils Relative: 8 % — ABNORMAL HIGH (ref 0–5)
HEMATOCRIT: 45 % (ref 39.0–52.0)
HEMOGLOBIN: 15.5 g/dL (ref 13.0–17.0)
LYMPHS PCT: 18 % (ref 12–46)
Lymphs Abs: 3.7 10*3/uL (ref 0.7–4.0)
MCH: 29 pg (ref 26.0–34.0)
MCHC: 34.4 g/dL (ref 30.0–36.0)
MCV: 84.3 fL (ref 78.0–100.0)
MYELOCYTES: 0 %
Metamyelocytes Relative: 0 %
Monocytes Absolute: 1.9 10*3/uL — ABNORMAL HIGH (ref 0.1–1.0)
Monocytes Relative: 9 % (ref 3–12)
NEUTROS PCT: 64 % (ref 43–77)
NRBC: 0 /100{WBCs}
Neutro Abs: 13.2 10*3/uL — ABNORMAL HIGH (ref 1.7–7.7)
OTHER: 0 %
PROMYELOCYTES ABS: 0 %
Platelets: 249 10*3/uL (ref 150–400)
RBC: 5.34 MIL/uL (ref 4.22–5.81)
RDW: 14.8 % (ref 11.5–15.5)
WBC: 20.7 10*3/uL — AB (ref 4.0–10.5)

## 2015-03-25 LAB — COMPREHENSIVE METABOLIC PANEL
ALK PHOS: 67 U/L (ref 38–126)
ALT: 10 U/L — AB (ref 17–63)
AST: 15 U/L (ref 15–41)
Albumin: 3.5 g/dL (ref 3.5–5.0)
Anion gap: 14 (ref 5–15)
BILIRUBIN TOTAL: 0.4 mg/dL (ref 0.3–1.2)
BUN: 35 mg/dL — AB (ref 6–20)
CALCIUM: 8.6 mg/dL — AB (ref 8.9–10.3)
CHLORIDE: 108 mmol/L (ref 101–111)
CO2: 13 mmol/L — ABNORMAL LOW (ref 22–32)
CREATININE: 4.08 mg/dL — AB (ref 0.61–1.24)
GFR, EST AFRICAN AMERICAN: 14 mL/min — AB (ref >60)
GFR, EST NON AFRICAN AMERICAN: 12 mL/min — AB (ref >60)
Glucose, Bld: 242 mg/dL — ABNORMAL HIGH (ref 65–99)
Potassium: 4.6 mmol/L (ref 3.5–5.1)
Sodium: 135 mmol/L (ref 135–145)
Total Protein: 7 g/dL (ref 6.5–8.1)

## 2015-03-25 LAB — C DIFFICILE QUICK SCREEN W PCR REFLEX
C DIFFICLE (CDIFF) ANTIGEN: NEGATIVE
C Diff interpretation: NEGATIVE
C Diff toxin: NEGATIVE

## 2015-03-25 LAB — GLUCOSE, CAPILLARY: GLUCOSE-CAPILLARY: 188 mg/dL — AB (ref 65–99)

## 2015-03-25 LAB — LIPASE, BLOOD: LIPASE: 16 U/L — AB (ref 22–51)

## 2015-03-25 LAB — TSH: TSH: 3.136 u[IU]/mL (ref 0.350–4.500)

## 2015-03-25 LAB — I-STAT CG4 LACTIC ACID, ED: Lactic Acid, Venous: 1.97 mmol/L (ref 0.5–2.0)

## 2015-03-25 LAB — CBG MONITORING, ED: Glucose-Capillary: 229 mg/dL — ABNORMAL HIGH (ref 65–99)

## 2015-03-25 MED ORDER — SODIUM CHLORIDE 0.9 % IV SOLN
INTRAVENOUS | Status: DC
  Administered 2015-03-25 – 2015-03-26 (×2): via INTRAVENOUS

## 2015-03-25 MED ORDER — ONDANSETRON HCL 4 MG PO TABS
4.0000 mg | ORAL_TABLET | Freq: Four times a day (QID) | ORAL | Status: DC | PRN
  Administered 2015-03-31: 4 mg via ORAL
  Filled 2015-03-25: qty 1

## 2015-03-25 MED ORDER — ONDANSETRON HCL 4 MG/2ML IJ SOLN
INTRAMUSCULAR | Status: AC
  Administered 2015-03-25: 4 mg via INTRAVENOUS
  Filled 2015-03-25: qty 2

## 2015-03-25 MED ORDER — ENOXAPARIN SODIUM 30 MG/0.3ML ~~LOC~~ SOLN
30.0000 mg | SUBCUTANEOUS | Status: DC
  Administered 2015-03-25 – 2015-03-26 (×2): 30 mg via SUBCUTANEOUS
  Filled 2015-03-25 (×2): qty 0.3

## 2015-03-25 MED ORDER — SODIUM CHLORIDE 0.9 % IV BOLUS (SEPSIS)
1000.0000 mL | Freq: Once | INTRAVENOUS | Status: AC
  Administered 2015-03-25: 1000 mL via INTRAVENOUS

## 2015-03-25 MED ORDER — ONDANSETRON HCL 4 MG/2ML IJ SOLN
4.0000 mg | INTRAMUSCULAR | Status: DC | PRN
  Administered 2015-03-25: 4 mg via INTRAVENOUS

## 2015-03-25 MED ORDER — NIACIN ER (ANTIHYPERLIPIDEMIC) 500 MG PO TBCR
500.0000 mg | EXTENDED_RELEASE_TABLET | Freq: Every day | ORAL | Status: DC
  Administered 2015-03-26 – 2015-04-01 (×8): 500 mg via ORAL
  Filled 2015-03-25 (×10): qty 1

## 2015-03-25 MED ORDER — PANTOPRAZOLE SODIUM 40 MG IV SOLR
40.0000 mg | INTRAVENOUS | Status: DC
  Administered 2015-03-25 – 2015-03-26 (×2): 40 mg via INTRAVENOUS
  Filled 2015-03-25 (×2): qty 40

## 2015-03-25 MED ORDER — SODIUM CHLORIDE 0.9 % IJ SOLN
3.0000 mL | Freq: Two times a day (BID) | INTRAMUSCULAR | Status: DC
  Administered 2015-03-25 – 2015-04-01 (×8): 3 mL via INTRAVENOUS

## 2015-03-25 MED ORDER — INSULIN ASPART 100 UNIT/ML ~~LOC~~ SOLN
0.0000 [IU] | Freq: Every day | SUBCUTANEOUS | Status: DC
  Administered 2015-03-31: 3 [IU] via SUBCUTANEOUS

## 2015-03-25 MED ORDER — INSULIN ASPART 100 UNIT/ML ~~LOC~~ SOLN
0.0000 [IU] | Freq: Three times a day (TID) | SUBCUTANEOUS | Status: DC
  Administered 2015-03-26: 2 [IU] via SUBCUTANEOUS
  Administered 2015-03-26 – 2015-03-28 (×5): 1 [IU] via SUBCUTANEOUS
  Administered 2015-03-30: 2 [IU] via SUBCUTANEOUS
  Administered 2015-03-30 – 2015-03-31 (×2): 1 [IU] via SUBCUTANEOUS
  Administered 2015-03-31 (×2): 3 [IU] via SUBCUTANEOUS
  Administered 2015-04-01 (×2): 2 [IU] via SUBCUTANEOUS
  Administered 2015-04-02 (×2): 3 [IU] via SUBCUTANEOUS

## 2015-03-25 MED ORDER — GABAPENTIN 100 MG PO CAPS
100.0000 mg | ORAL_CAPSULE | Freq: Three times a day (TID) | ORAL | Status: DC
  Administered 2015-03-25 – 2015-04-02 (×23): 100 mg via ORAL
  Filled 2015-03-25 (×24): qty 1

## 2015-03-25 MED ORDER — ASPIRIN EC 81 MG PO TBEC
81.0000 mg | DELAYED_RELEASE_TABLET | Freq: Every day | ORAL | Status: DC
  Administered 2015-03-25 – 2015-04-02 (×8): 81 mg via ORAL
  Filled 2015-03-25 (×8): qty 1

## 2015-03-25 MED ORDER — INSULIN DETEMIR 100 UNIT/ML ~~LOC~~ SOLN
60.0000 [IU] | Freq: Every morning | SUBCUTANEOUS | Status: DC
  Administered 2015-03-26 – 2015-04-02 (×7): 60 [IU] via SUBCUTANEOUS
  Filled 2015-03-25 (×9): qty 0.6

## 2015-03-25 MED ORDER — ONDANSETRON HCL 4 MG/2ML IJ SOLN: 4.0000 mg | Freq: Four times a day (QID) | INTRAMUSCULAR | Status: DC | PRN

## 2015-03-25 MED ORDER — OMEGA-3-ACID ETHYL ESTERS 1 G PO CAPS
1.0000 g | ORAL_CAPSULE | Freq: Two times a day (BID) | ORAL | Status: DC
  Administered 2015-03-25 – 2015-04-02 (×15): 1 g via ORAL
  Filled 2015-03-25 (×15): qty 1

## 2015-03-25 NOTE — ED Notes (Signed)
ER MD states to give a liter of fluids over one hour and to keep the patient in the ER until the fluids finish.  Notified nurse on 300.

## 2015-03-25 NOTE — ED Provider Notes (Signed)
CSN: [id number]     Arrival date & time 03/25/15  1848 History   First MD Initiated Contact with Patient 03/25/15 1904     Chief Complaint  Patient presents with  . Diarrhea  . Abdominal Pain     (Consider location/radiation/quality/duration/timing/severity/associated sxs/prior Treatment) Patient is a 81 y.o. male presenting with diarrhea. The history is provided by the patient.  Diarrhea Quality:  Watery Severity:  Severe Onset quality:  Gradual Duration:  4 months Timing:  Constant Progression:  Worsening Relieved by:  Nothing Worsened by:  Nothing tried Ineffective treatments:  Anti-motility medications and change in diet Associated symptoms: abdominal pain   Associated symptoms: no fever   Risk factors: no recent antibiotic use, no sick contacts, no suspicious food intake and no travel to endemic areas   Risk factors comment:  [place] water   Past Medical History  Diagnosis Date  . Diabetes mellitus   . Hypertension   . Hyperchloremia   . GERD (gastroesophageal reflux disease)   . Arthritis    Past Surgical History  Procedure Laterality Date  . Tonsillectomy    . Knee arthroscopy Left   . Shoulder arthroscopy with open rotator cuff repair Left 10/15/2012    Procedure: SHOULDER ARTHROSCOPY WITH OPEN ROTATOR CUFF REPAIR;  Surgeon: Vickki Hearing, MD;  Location: [facility];  Service: Orthopedics;  Laterality: Left;  open at 0912; with bicep tendon debridement;   . Shoulder acromioplasty Left 10/15/2012    Procedure: SHOULDER ACROMIOPLASTY;  Surgeon: Vickki Hearing, MD;  Location: [facility];  Service: Orthopedics;  Laterality: Left;  . Resection distal clavical Left 10/15/2012    Procedure: RESECTION DISTAL CLAVICAL;  Surgeon: Vickki Hearing, MD;  Location: [facility];  Service: Orthopedics;  Laterality: Left;  . Cataract extraction w/phaco Left 03/07/2014    Procedure: CATARACT EXTRACTION PHACO AND INTRAOCULAR LENS PLACEMENT LEFT EYE CDE=7.11;  Surgeon: Loraine Leriche T. Nile Riggs, MD;   Location: [facility];  Service: Ophthalmology;  Laterality: Left;  . Cataract extraction w/phaco Right 03/14/2014    Procedure: CATARACT EXTRACTION PHACO AND INTRAOCULAR LENS PLACEMENT RIGHT EYE CDE=10.62;  Surgeon: Loraine Leriche T. Nile Riggs, MD;  Location: [facility];  Service: Ophthalmology;  Laterality: Right;   Family History  Problem Relation Age of Onset  . Arthritis    . Cancer    . Diabetes     Social History  Substance Use Topics  . Smoking status: Former Smoker -- 3.00 packs/day for 30 years    Types: Cigarettes    Quit date: 10/11/1993  . Smokeless tobacco: None  . Alcohol Use: No    Review of Systems  Constitutional: Negative for fever.  Gastrointestinal: Positive for abdominal pain and diarrhea.  All other systems reviewed and are negative.     Allergies  Review of patient's allergies indicates no known allergies.  Home Medications   Prior to Admission medications   Medication Sig Start Date End Date Taking? Authorizing Provider  aspirin EC 81 MG tablet Take 81 mg by mouth daily.   Yes Historical Provider, MD  fish oil-omega-3 fatty acids 1000 MG capsule Take 1 g by mouth 2 (two) times daily.    Yes Historical Provider, MD  gabapentin (NEURONTIN) 100 MG capsule Take 1 capsule (100 mg total) by mouth 3 (three) times daily. 01/09/15  Yes Vickki Hearing, MD  HYDROcodone-acetaminophen (NORCO/VICODIN) 5-325 MG per tablet Take 1 tablet by mouth every 4 (four) hours as needed for moderate pain. 08/03/14  Yes Vickki Hearing, MD  ibuprofen (  ADVIL,MOTRIN) 800 MG tablet Take 1 tablet (800 mg total) by mouth every 8 (eight) hours as needed. Patient taking differently: Take 800 mg by mouth every 8 (eight) hours as needed for mild pain or moderate pain.  08/03/14  Yes Vickki Hearing, MD  insulin detemir (LEVEMIR) 100 UNIT/ML injection Inject 60 Units into the skin every morning.   Yes Historical Provider, MD  insulin lispro (HUMALOG) 100 UNIT/ML injection Inject 1-8 Units into the skin  3 (three) times daily before meals.   Yes Historical Provider, MD  lisinopril (PRINIVIL,ZESTRIL) 40 MG tablet Take 40 mg by mouth daily.     Yes Historical Provider, MD  metFORMIN (GLUCOPHAGE) 1000 MG tablet Take 1,000 mg by mouth 2 (two) times daily with a meal.     Yes Historical Provider, MD  Multiple Vitamin (MULTIVITAMIN WITH MINERALS) TABS tablet Take 1 tablet by mouth daily.   Yes Historical Provider, MD  NIASPAN 500 MG CR tablet Take 500 mg by mouth at bedtime.  06/08/11  Yes Historical Provider, MD  ONGLYZA 5 MG TABS tablet Take 5 mg by mouth daily.  06/08/11  Yes Historical Provider, MD  ranitidine (ZANTAC) 150 MG capsule Take 150 mg by mouth daily.    Yes Historical Provider, MD  simvastatin (ZOCOR) 80 MG tablet Take 80 mg by mouth at bedtime.     Yes Historical Provider, MD   BP 101/61 mmHg  Pulse 106  Temp(Src) 98.3 F (36.8 C) (Oral)  Resp 17  Ht  (1.778 m)  Wt 180 lb (81.647 kg)  BMI 25.83 kg/m2  SpO2 97% Physical Exam  Constitutional: He is oriented to person, place, and time. He appears well-developed and well-nourished. No distress.  HENT:  Head: Normocephalic and atraumatic.  Eyes: Conjunctivae are normal.  Neck: Neck supple. No tracheal deviation present.  Cardiovascular: Regular rhythm.  Tachycardia present.   Murmur heard.  Systolic (holosystolic over apex) murmur is present with a grade of 3/6  Pulmonary/Chest: Effort normal and breath sounds normal. No respiratory distress.  Abdominal: Soft. He exhibits no distension. There is no tenderness. There is no rebound.  Neurological: He is alert and oriented to person, place, and time.  Skin: Skin is warm and dry.  Psychiatric: He has a normal mood and affect.    ED Course  Procedures (including critical care time) Labs Review  Labs Reviewed  CBC WITH DIFFERENTIAL/PLATELET - Abnormal; Notable for the following:    WBC 20.7 (*)    Eosinophils Relative 8 (*)    Neutro Abs 13.2 (*)    Monocytes Absolute  1.9 (*)    Eosinophils Absolute 1.7 (*)    Basophils Absolute 0.2 (*)    All other components within normal limits  LIPASE, BLOOD - Abnormal; Notable for the following:    Lipase 16 (*)    All other components within normal limits  COMPREHENSIVE METABOLIC PANEL - Abnormal; Notable for the following:    CO2 13 (*)    Glucose, Bld 242 (*)    BUN 35 (*)    Creatinine, Ser 4.08 (*)    Calcium 8.6 (*)    ALT 10 (*)    GFR calc non Af Amer 12 (*)    GFR calc Af Amer 14 (*)    All other components within normal limits  CBG MONITORING, ED - Abnormal; Notable for the following:    Glucose-Capillary 229 (*)    All other components within normal limits  C DIFFICILE QUICK SCREEN W  PCR REFLEX  CBC  CREATININE, SERUM  TSH  COMPREHENSIVE METABOLIC PANEL  CBC  I-STAT CG4 LACTIC ACID, ED    Imaging Review No results found. I have personally reviewed and evaluated these images and lab results as part of my medical decision-making.   EKG Interpretation None      MDM   Final diagnoses:  Acute renal failure, unspecified acute renal failure type  Intractable diarrhea  Leukocytosis   81 year old male presents with intractable diarrhea that he describes as over the last 4 months. It has progressively worsened throughout the last week and he has become weaker with abdominal and back pain. On arrival he is tachycardic and mildly hypotensive. He was fluid resuscitated awaiting lab results and was found to be in acute renal failure with a leukocytosis that has no obvious explanation except severe dehydration. Increasing creatinine appears to be due to a prerenal insult but CT scan of the abdomen and pelvis was ordered without contrast to evaluate for obstructive, infectious, surgical or other etiologies. Hospitalist was consulted for admission and will see the patient in the emergency department.   Murmur suggestive of mitral regurgitation, Pt unaware of this on previous physical exams, would  recommend further evaluation following acute stabilization.   Lyndal Pulley, MD 03/26/15 1314

## 2015-03-25 NOTE — ED Notes (Signed)
Has been having loose stools for "quite a while"  Has worstened over past week - been having back and abdo pain

## 2015-03-25 NOTE — H&P (Signed)
[facility] History and Physical  Eziah Negro ZOX:[id number] DOB: [birth year] DOA: 03/25/2015  Referring physician: ER PCP: Alice Reichert, MD   Chief Complaint: Diarrhea  HPI: [name] is a 81 y.o. male  This is an 81 year old man who gives a 4 month history of diarrhea associated with abdominal pain/cramping. He denies any rectal bleeding, or melana. There is no hematemesis. There has been no significant weight loss. He denies any fever. He is diabetic on insulin. He is also hypertensive on ace inhibitors. When he presented to the emergency room today, he was found to be relatively hypotensive, responding to fluids, and also in acute renal failure. His previous creatinine approximately one year ago was 1.0 and now it is over 4. He denies any polyuria or polydipsia. He is now being admitted for further management.   Review of Systems:  Apart from symptoms above, all systems negative..   Past Medical History  Diagnosis Date  . Diabetes mellitus   . Hypertension   . Hyperchloremia   . GERD (gastroesophageal reflux disease)   . Arthritis    Past Surgical History  Procedure Laterality Date  . Tonsillectomy    . Knee arthroscopy Left   . Shoulder arthroscopy with open rotator cuff repair Left 10/15/2012    Procedure: SHOULDER ARTHROSCOPY WITH OPEN ROTATOR CUFF REPAIR;  Surgeon: Vickki Hearing, MD;  Location: [facility];  Service: Orthopedics;  Laterality: Left;  open at 0912; with bicep tendon debridement;   . Shoulder acromioplasty Left 10/15/2012    Procedure: SHOULDER ACROMIOPLASTY;  Surgeon: Vickki Hearing, MD;  Location: [facility];  Service: Orthopedics;  Laterality: Left;  . Resection distal clavical Left 10/15/2012    Procedure: RESECTION DISTAL CLAVICAL;  Surgeon: Vickki Hearing, MD;  Location: [facility];  Service: Orthopedics;  Laterality: Left;  . Cataract extraction w/phaco Left 03/07/2014    Procedure: CATARACT EXTRACTION PHACO AND INTRAOCULAR LENS PLACEMENT  LEFT EYE CDE=7.11;  Surgeon: Loraine Leriche T. Nile Riggs, MD;  Location: [facility];  Service: Ophthalmology;  Laterality: Left;  . Cataract extraction w/phaco Right 03/14/2014    Procedure: CATARACT EXTRACTION PHACO AND INTRAOCULAR LENS PLACEMENT RIGHT EYE CDE=10.62;  Surgeon: Loraine Leriche T. Nile Riggs, MD;  Location: [facility];  Service: Ophthalmology;  Laterality: Right;   Social History:  reports that he quit smoking about 21 years ago. His smoking use included Cigarettes. He has a 90 pack-year smoking history. He does not have any smokeless tobacco history on file. He reports that he does not drink alcohol or use illicit drugs.  No Known Allergies  Family History  Problem Relation Age of Onset  . Arthritis    . Cancer    . Diabetes      Prior to Admission medications   Medication Sig Start Date End Date Taking? Authorizing Provider  aspirin EC 81 MG tablet Take 81 mg by mouth daily.   Yes Historical Provider, MD  fish oil-omega-3 fatty acids 1000 MG capsule Take 1 g by mouth 2 (two) times daily.    Yes Historical Provider, MD  gabapentin (NEURONTIN) 100 MG capsule Take 1 capsule (100 mg total) by mouth 3 (three) times daily. 01/09/15  Yes Vickki Hearing, MD  HYDROcodone-acetaminophen (NORCO/VICODIN) 5-325 MG per tablet Take 1 tablet by mouth every 4 (four) hours as needed for moderate pain. 08/03/14  Yes Vickki Hearing, MD  ibuprofen (ADVIL,MOTRIN) 800 MG tablet Take 1 tablet (800 mg total) by mouth every 8 (eight) hours as needed. Patient taking differently: Take 800 mg  by mouth every 8 (eight) hours as needed for mild pain or moderate pain.  08/03/14  Yes Vickki Hearing, MD  insulin detemir (LEVEMIR) 100 UNIT/ML injection Inject 60 Units into the skin every morning.   Yes Historical Provider, MD  insulin lispro (HUMALOG) 100 UNIT/ML injection Inject 1-8 Units into the skin 3 (three) times daily before meals.   Yes Historical Provider, MD  lisinopril (PRINIVIL,ZESTRIL) 40 MG tablet Take 40 mg by mouth  daily.     Yes Historical Provider, MD  metFORMIN (GLUCOPHAGE) 1000 MG tablet Take 1,000 mg by mouth 2 (two) times daily with a meal.     Yes Historical Provider, MD  Multiple Vitamin (MULTIVITAMIN WITH MINERALS) TABS tablet Take 1 tablet by mouth daily.   Yes Historical Provider, MD  NIASPAN 500 MG CR tablet Take 500 mg by mouth at bedtime.  06/08/11  Yes Historical Provider, MD  ONGLYZA 5 MG TABS tablet Take 5 mg by mouth daily.  06/08/11  Yes Historical Provider, MD  ranitidine (ZANTAC) 150 MG capsule Take 150 mg by mouth daily.    Yes Historical Provider, MD  simvastatin (ZOCOR) 80 MG tablet Take 80 mg by mouth at bedtime.     Yes Historical Provider, MD   Physical Exam: Filed Vitals:   03/25/15 2000 03/25/15 2015 03/25/15 2030 03/25/15 2051  BP: 79/55 116/59 76/61 86/70   Pulse: 108  104 99  Temp:      TempSrc:      Resp: 23  20 20   Height:      Weight:      SpO2: 97%  97% 98%    Wt Readings from Last 3 Encounters:  03/25/15 81.647 kg (180 lb)  01/09/15 82.101 kg (181 lb)  08/14/14 86.637 kg (191 lb)    General:  Appears clinically dehydrated. Radial pulses are robust. Eyes: PERRL, normal lids, irises & conjunctiva ENT: grossly normal hearing, lips & tongue Neck: no LAD, masses or thyromegaly Cardiovascular: RRR, no m/r/g. No LE edema. Telemetry: SR, no arrhythmias  Respiratory: CTA bilaterally, no w/r/r. Normal respiratory effort. Abdomen: soft, ntnd. The abdomen appears somewhat distended but I cannot appreciate any ascites and there are no masses felt. There is no tenderness. Bowel sounds are heard and appear to be within normal limits. Skin: no rash or induration seen on limited exam Musculoskeletal: grossly normal tone BUE/BLE Psychiatric: grossly normal mood and affect, speech fluent and appropriate Neurologic: grossly non-focal.          Labs on Admission:  Basic Metabolic Panel:  Recent Labs Lab 03/25/15 1920  NA 135  K 4.6  CL 108  CO2 13*  GLUCOSE 242*    BUN 35*  CREATININE 4.08*  CALCIUM 8.6*   Liver Function Tests:  Recent Labs Lab 03/25/15 1920  AST 15  ALT 10*  ALKPHOS 67  BILITOT 0.4  PROT 7.0  ALBUMIN 3.5    Recent Labs Lab 03/25/15 1920  LIPASE 16*   No results for input(s): AMMONIA in the last 168 hours. CBC:  Recent Labs Lab 03/25/15 1920  WBC 20.7*  NEUTROABS 13.2*  HGB 15.5  HCT 45.0  MCV 84.3  PLT 249   Cardiac Enzymes: No results for input(s): CKTOTAL, CKMB, CKMBINDEX, TROPONINI in the last 168 hours.  BNP (last 3 results) No results for input(s): BNP in the last 8760 hours.  ProBNP (last 3 results) No results for input(s): PROBNP in the last 8760 hours.  CBG:  Recent Labs Lab 03/25/15 1907  GLUCAP 229*    Radiological Exams on Admission: No results found.    Assessment/Plan   1. Acute renal failure. This appears to be on the face of hypovolemia. He also has acidosis, likely metabolic from his diarrhea chronically. I do not think he is in DKA. He'll be given fairly aggressive IV fluids and will monitor renal function closely. 2. Chronic diarrhea. I agree with CT scan of the abdomen without contrast. Depending on these results, he may need gastroenterology consultation. C. difficile toxin has been apparently ordered. 3. Diabetes. Continue with home insulin and sliding scale. Hold metformin and other oral hypoglycemic agents. 4. Hypertension. He is currently relatively hypotensive. Hold ACE inhibitor.  Further recommendations will depend on patient's hospital progress.   Code Status: Full code.  DVT Prophylaxis: Lovenox.  Family Communication: I discussed the plan with the patient at the bedside.  Disposition Plan: Home when medically stable.  Time spent: 45 minutes.  Wilson Singer [facility] Pager [phone].

## 2015-03-26 LAB — URINALYSIS, ROUTINE W REFLEX MICROSCOPIC
Bilirubin Urine: NEGATIVE
Glucose, UA: NEGATIVE mg/dL
Hgb urine dipstick: NEGATIVE
KETONES UR: NEGATIVE mg/dL
LEUKOCYTES UA: NEGATIVE
NITRITE: NEGATIVE
PROTEIN: NEGATIVE mg/dL
Specific Gravity, Urine: 1.025 (ref 1.005–1.030)
Urobilinogen, UA: 0.2 mg/dL (ref 0.0–1.0)
pH: 5.5 (ref 5.0–8.0)

## 2015-03-26 LAB — GLUCOSE, CAPILLARY
GLUCOSE-CAPILLARY: 164 mg/dL — AB (ref 65–99)
Glucose-Capillary: 112 mg/dL — ABNORMAL HIGH (ref 65–99)
Glucose-Capillary: 141 mg/dL — ABNORMAL HIGH (ref 65–99)
Glucose-Capillary: 122 mg/dL — ABNORMAL HIGH (ref 65–99)

## 2015-03-26 LAB — CBC
HCT: 33.2 % — ABNORMAL LOW (ref 39.0–52.0)
HEMOGLOBIN: 11 g/dL — AB (ref 13.0–17.0)
MCH: 28.4 pg (ref 26.0–34.0)
MCHC: 33.1 g/dL (ref 30.0–36.0)
MCV: 85.6 fL (ref 78.0–100.0)
PLATELETS: 146 10*3/uL — AB (ref 150–400)
RBC: 3.88 MIL/uL — AB (ref 4.22–5.81)
RDW: 15 % (ref 11.5–15.5)
WBC: 12.6 10*3/uL — ABNORMAL HIGH (ref 4.0–10.5)

## 2015-03-26 LAB — COMPREHENSIVE METABOLIC PANEL
ALBUMIN: 2.7 g/dL — AB (ref 3.5–5.0)
ALK PHOS: 46 U/L (ref 38–126)
ALT: 8 U/L — AB (ref 17–63)
ANION GAP: 8 (ref 5–15)
AST: 11 U/L — ABNORMAL LOW (ref 15–41)
BILIRUBIN TOTAL: 0.5 mg/dL (ref 0.3–1.2)
BUN: 33 mg/dL — ABNORMAL HIGH (ref 6–20)
CALCIUM: 7.3 mg/dL — AB (ref 8.9–10.3)
CO2: 14 mmol/L — ABNORMAL LOW (ref 22–32)
CREATININE: 2.85 mg/dL — AB (ref 0.61–1.24)
Chloride: 116 mmol/L — ABNORMAL HIGH (ref 101–111)
GFR calc non Af Amer: 19 mL/min — ABNORMAL LOW (ref >60)
GFR, EST AFRICAN AMERICAN: 22 mL/min — AB (ref >60)
Glucose, Bld: 128 mg/dL — ABNORMAL HIGH (ref 65–99)
Potassium: 4 mmol/L (ref 3.5–5.1)
SODIUM: 138 mmol/L (ref 135–145)
TOTAL PROTEIN: 5.2 g/dL — AB (ref 6.5–8.1)

## 2015-03-26 LAB — CHLORIDE, URINE, RANDOM: Chloride Urine: 47 mmol/L

## 2015-03-26 LAB — NA AND K (SODIUM & POTASSIUM), RAND UR
Potassium Urine: 27 mmol/L
Sodium, Ur: 51 mmol/L

## 2015-03-26 MED ORDER — LOPERAMIDE HCL 2 MG PO CAPS
2.0000 mg | ORAL_CAPSULE | Freq: Three times a day (TID) | ORAL | Status: DC
  Administered 2015-03-26 – 2015-03-27 (×2): 2 mg via ORAL
  Filled 2015-03-26 (×2): qty 1

## 2015-03-26 MED ORDER — SODIUM CHLORIDE 4 MEQ/ML IV SOLN
INTRAVENOUS | Status: DC
  Administered 2015-03-26 – 2015-03-29 (×6): via INTRAVENOUS
  Filled 2015-03-26 (×14): qty 9.7

## 2015-03-26 MED ORDER — NIACIN 500 MG PO TABS
ORAL_TABLET | ORAL | Status: AC
  Filled 2015-03-26: qty 1

## 2015-03-26 MED ORDER — LOPERAMIDE HCL 2 MG PO CAPS
2.0000 mg | ORAL_CAPSULE | Freq: Three times a day (TID) | ORAL | Status: DC | PRN
  Administered 2015-03-26: 2 mg via ORAL
  Filled 2015-03-26: qty 1

## 2015-03-26 NOTE — Progress Notes (Signed)
Notified Dr. Renard Matter due to the patient continuing to have multiple stools.  Recommendations to order GI consult. New orders and followed.

## 2015-03-26 NOTE — Care Management Note (Signed)
Case Management Note  Patient Details  Name: [name] MRN: [id number] Date of Birth: 02/08/1930  Subjective/Objective:                  Pt admitted from home with ARF. Pt lives with his wife and will return home at discharge. Pt is independent with ADL's.   Action/Plan: No CM needs noted.  Expected Discharge Date:                  Expected Discharge Plan:  Home/Self Care  In-House Referral:  NA  Discharge planning Services  CM Consult  Post Acute Care Choice:  NA Choice offered to:  NA  DME Arranged:    DME Agency:     HH Arranged:    HH Agency:     Status of Service:  Completed, signed off  Medicare Important Message Given:    Date Medicare IM Given:    Medicare IM give by:    Date Additional Medicare IM Given:    Additional Medicare Important Message give by:     If discussed at Long Length of Stay Meetings, dates discussed:    Additional Comments:  Cheryl Flash, RN 03/26/2015, 3:20 PM

## 2015-03-26 NOTE — Consult Note (Signed)
Reason for Consult:AKI Referring Physician: Dr. Gala Romney Kramer is an 81 y.o. male.  HPI: Patient with long history of Diabetes, hypertension presently came with complaints of chronic diarrhea for the last 4 months. Patient states that he has watery diarrhea about 4-5 times a day. There is no blood. He doesn't have any vomiting but she has occasional nausea. Patient also denies any fever chills or sweating. Presently consults called because of elevated BUN and creatinine. Patient denies any previous history of renal failure and no history of kidney stone.  Past Medical History  Diagnosis Date  . Diabetes mellitus   . Hypertension   . Hyperchloremia   . GERD (gastroesophageal reflux disease)   . Arthritis     Past Surgical History  Procedure Laterality Date  . Tonsillectomy    . Knee arthroscopy Left   . Shoulder arthroscopy with open rotator cuff repair Left 10/15/2012    Procedure: SHOULDER ARTHROSCOPY WITH OPEN ROTATOR CUFF REPAIR;  Surgeon: Carole Civil, MD;  Location: [facility];  Service: Orthopedics;  Laterality: Left;  open at 0912; with bicep tendon debridement;   . Shoulder acromioplasty Left 10/15/2012    Procedure: SHOULDER ACROMIOPLASTY;  Surgeon: Carole Civil, MD;  Location: [facility];  Service: Orthopedics;  Laterality: Left;  . Resection distal clavical Left 10/15/2012    Procedure: RESECTION DISTAL CLAVICAL;  Surgeon: Carole Civil, MD;  Location: [facility];  Service: Orthopedics;  Laterality: Left;  . Cataract extraction w/phaco Left 03/07/2014    Procedure: CATARACT EXTRACTION PHACO AND INTRAOCULAR LENS PLACEMENT LEFT EYE CDE=7.11;  Surgeon: Elta Guadeloupe T. Gershon Crane, MD;  Location: [facility];  Service: Ophthalmology;  Laterality: Left;  . Cataract extraction w/phaco Right 03/14/2014    Procedure: CATARACT EXTRACTION PHACO AND INTRAOCULAR LENS PLACEMENT RIGHT EYE CDE=10.62;  Surgeon: Elta Guadeloupe T. Gershon Crane, MD;  Location: [facility];  Service: Ophthalmology;  Laterality: Right;     Family History  Problem Relation Age of Onset  . Arthritis    . Cancer    . Diabetes      Social History:  reports that he quit smoking about 21 years ago. His smoking use included Cigarettes. He has a 90 pack-year smoking history. He does not have any smokeless tobacco history on file. He reports that he does not drink alcohol or use illicit drugs.  Allergies: No Known Allergies  Medications: I have reviewed the patient's current medications.  Results for orders placed or performed during the hospital encounter of 03/25/15 (from the past 48 hour(s))  POC CBG, ED     Status: Abnormal   Collection Time: 03/25/15  7:07 PM  Result Value Ref Range   Glucose-Capillary 229 (H) 65 - 99 mg/dL  CBC with Differential/Platelet     Status: Abnormal   Collection Time: 03/25/15  7:20 PM  Result Value Ref Range   WBC 20.7 (H) 4.0 - 10.5 K/uL   RBC 5.34 4.22 - 5.81 MIL/uL   Hemoglobin 15.5 13.0 - 17.0 g/dL   HCT 45.0 39.0 - 52.0 %   MCV 84.3 78.0 - 100.0 fL   MCH 29.0 26.0 - 34.0 pg   MCHC 34.4 30.0 - 36.0 g/dL   RDW 14.8 11.5 - 15.5 %   Platelets 249 150 - 400 K/uL   Neutrophils Relative % 64 43 - 77 %   Lymphocytes Relative 18 12 - 46 %   Monocytes Relative 9 3 - 12 %   Eosinophils Relative 8 (H) 0 - 5 %  Basophils Relative 1 0 - 1 %   Band Neutrophils 0 0 - 10 %   Metamyelocytes Relative 0 %   Myelocytes 0 %   Promyelocytes Absolute 0 %   Blasts 0 %   nRBC 0 0 /100 WBC   Other 0 %   Neutro Abs 13.2 (H) 1.7 - 7.7 K/uL   Lymphs Abs 3.7 0.7 - 4.0 K/uL   Monocytes Absolute 1.9 (H) 0.1 - 1.0 K/uL   Eosinophils Absolute 1.7 (H) 0.0 - 0.7 K/uL   Basophils Absolute 0.2 (H) 0.0 - 0.1 K/uL  Lipase, blood     Status: Abnormal   Collection Time: 03/25/15  7:20 PM  Result Value Ref Range   Lipase 16 (L) 22 - 51 U/L  Comprehensive metabolic panel     Status: Abnormal   Collection Time: 03/25/15  7:20 PM  Result Value Ref Range   Sodium 135 135 - 145 mmol/L   Potassium 4.6 3.5 - 5.1  mmol/L   Chloride 108 101 - 111 mmol/L   CO2 13 (L) 22 - 32 mmol/L   Glucose, Bld 242 (H) 65 - 99 mg/dL   BUN 35 (H) 6 - 20 mg/dL   Creatinine, Ser 4.08 (H) 0.61 - 1.24 mg/dL   Calcium 8.6 (L) 8.9 - 10.3 mg/dL   Total Protein 7.0 6.5 - 8.1 g/dL   Albumin 3.5 3.5 - 5.0 g/dL   AST 15 15 - 41 U/L   ALT 10 (L) 17 - 63 U/L   Alkaline Phosphatase 67 38 - 126 U/L   Total Bilirubin 0.4 0.3 - 1.2 mg/dL   GFR calc non Af Amer 12 (L) >60 mL/min   GFR calc Af Amer 14 (L) >60 mL/min    Comment: (NOTE) The eGFR has been calculated using the CKD EPI equation. This calculation has not been validated in all clinical situations. eGFR's persistently <60 mL/min signify possible Chronic Kidney Disease.    Anion gap 14 5 - 15  I-Stat CG4 Lactic Acid, ED     Status: None   Collection Time: 03/25/15  7:30 PM  Result Value Ref Range   Lactic Acid, Venous 1.97 0.5 - 2.0 mmol/L  TSH     Status: None   Collection Time: 03/25/15  7:30 PM  Result Value Ref Range   TSH 3.136 0.350 - 4.500 uIU/mL  C difficile quick scan w PCR reflex     Status: None   Collection Time: 03/25/15  8:18 PM  Result Value Ref Range   C Diff antigen NEGATIVE NEGATIVE   C Diff toxin NEGATIVE NEGATIVE   C Diff interpretation Negative for toxigenic C. difficile   Glucose, capillary     Status: Abnormal   Collection Time: 03/25/15 11:29 PM  Result Value Ref Range   Glucose-Capillary 188 (H) 65 - 99 mg/dL  Comprehensive metabolic panel     Status: Abnormal   Collection Time: 03/26/15  6:26 AM  Result Value Ref Range   Sodium 138 135 - 145 mmol/L   Potassium 4.0 3.5 - 5.1 mmol/L   Chloride 116 (H) 101 - 111 mmol/L   CO2 14 (L) 22 - 32 mmol/L   Glucose, Bld 128 (H) 65 - 99 mg/dL   BUN 33 (H) 6 - 20 mg/dL   Creatinine, Ser 2.85 (H) 0.61 - 1.24 mg/dL    Comment: DELTA CHECK NOTED   Calcium 7.3 (L) 8.9 - 10.3 mg/dL   Total Protein 5.2 (L) 6.5 - 8.1 g/dL  Albumin 2.7 (L) 3.5 - 5.0 g/dL   AST 11 (L) 15 - 41 U/L   ALT 8 (L) 17 -  63 U/L   Alkaline Phosphatase 46 38 - 126 U/L   Total Bilirubin 0.5 0.3 - 1.2 mg/dL   GFR calc non Af Amer 19 (L) >60 mL/min   GFR calc Af Amer 22 (L) >60 mL/min    Comment: (NOTE) The eGFR has been calculated using the CKD EPI equation. This calculation has not been validated in all clinical situations. eGFR's persistently <60 mL/min signify possible Chronic Kidney Disease.    Anion gap 8 5 - 15  CBC     Status: Abnormal   Collection Time: 03/26/15  6:26 AM  Result Value Ref Range   WBC 12.6 (H) 4.0 - 10.5 K/uL   RBC 3.88 (L) 4.22 - 5.81 MIL/uL   Hemoglobin 11.0 (L) 13.0 - 17.0 g/dL    Comment: DELTA CHECK NOTED RESULT REPEATED AND VERIFIED    HCT 33.2 (L) 39.0 - 52.0 %   MCV 85.6 78.0 - 100.0 fL   MCH 28.4 26.0 - 34.0 pg   MCHC 33.1 30.0 - 36.0 g/dL   RDW 15.0 11.5 - 15.5 %   Platelets 146 (L) 150 - 400 K/uL    Ct Abdomen Pelvis Wo Contrast  03/25/2015   CLINICAL DATA:  Low mid back pain radiating to both flanks. Diarrhea for 4 months intermittently. Umbilical pain, right lower quadrant pain, left lower quadrant pain. Nausea.  EXAM: CT ABDOMEN AND PELVIS WITHOUT CONTRAST  TECHNIQUE: Multidetector CT imaging of the abdomen and pelvis was performed following the standard protocol without IV contrast.  COMPARISON:  None.  FINDINGS: Focal peribronchial infiltration in the left lung base could represent early bronchiolitis. Coronary artery calcifications.  Calcified granuloma in the liver. Gallbladder is mildly distended but no stones or inflammatory infiltration are identified. No bile duct dilatation. Unenhanced appearance of the spleen, pancreas, adrenal glands, kidneys, inferior vena cava, and retroperitoneal lymph nodes is unremarkable. Calcification of abdominal aorta without aneurysm. Stomach, small bowel, and colon are not abnormally distended. No free air or free fluid in the abdomen. Abdominal wall musculature appears intact.  Pelvis: Appendix is normal. Prostate gland is mildly  enlarged with calcifications. Bladder wall is not thickened. Diverticula in the sigmoid colon without inflammatory change. No free or loculated pelvic fluid collections. No pelvic lymphadenopathy. Degenerative changes in the spine. No destructive bone lesions appreciated.  IMPRESSION: Focal peribronchial infiltration in the left lung base suggesting bronchiolitis. Mild nonspecific gallbladder distention without inflammatory change or stone identified. Prostate gland is enlarged.   Electronically Signed   By: Lucienne Capers M.D.   On: 03/25/2015 21:40    Review of Systems physical signs Unable to perform ROS Constitutional: Negative for fever and chills.  Respiratory: Negative for shortness of breath.   Cardiovascular: Negative for orthopnea and leg swelling.  Gastrointestinal: Positive for heartburn, nausea and diarrhea. Negative for vomiting, abdominal pain, blood in stool and melena.  Genitourinary: Negative for dysuria and urgency.   Blood pressure 124/52, pulse 106, temperature 97.8 F (36.6 C), temperature source Oral, resp. rate 21, height '5\' 10"'  (1.778 m), weight 180 lb (81.647 kg), SpO2 98 %. Physical Exam  Constitutional: No distress.  Eyes: No scleral icterus.  Neck: No JVD present.  Cardiovascular: Normal rate and regular rhythm.   No murmur heard. Respiratory: He has no wheezes. He has no rales.  GI: He exhibits no distension. There is no tenderness.  Musculoskeletal: He exhibits no edema.    Assessment/Plan: Problem #1 Acute  on chronic renal failure: Etiology for his acute kidney injury could be secondary to prerenal syndrome/ATN/ACE inhibitor/NSAID. Presently patient is nonoliguric and his renal function is improving. Problem #2 chronic renal failure patient with creatinine 0.98 GFR of 76 mL/m for the last 2 years. This is stage II. Most likely a compression of diabetes/hypertension/age-related renal loss. Problem #3 chronic diarrhea: Etiology not clear. Patient states  that he has diarrhea for the last 4 months. Problem #4 diabetes Problem #5 hypertension: Patient came with hypotension presently his blood pressure is improving Problem #6 history of GERD Problem #7 history of arthritis Problem #8 possible metabolic acidosis Plan: Agree with hydration We'll decrease his IV fluid to 1 35 mL/m We'll check his UA and if patient has proteinuria we will add additional workup. We'll add 50 mEq of sodium bicarbonate to his IV fluid.   [place]

## 2015-03-26 NOTE — Progress Notes (Signed)
Subjective: The patient is alert and oriented. He states that he still having episodes of diarrhea. He is diabetic on insulin and has hypertension. His more recent blood pressure 124/52. He does have markedly elevated creatinine 4.08 and is thought to be in renal failure. By CT exam prostate is enlarged  Objective: Vital signs in last 24 hours: Temp:  [97.8 F (36.6 C)-98.3 F (36.8 C)] 97.8 F (36.6 C) (08/22 0540) Pulse Rate:  [92-114] 106 (08/22 0540) Resp:  [16-23] 21 (08/22 0540) BP: (76-124)/(47-70) 124/52 mmHg (08/22 0540) SpO2:  [96 %-100 %] 98 % (08/22 0540) Weight:  [81.647 kg (180 lb)] 81.647 kg (180 lb) (08/21 1902) Weight change:  Last BM Date: 03/25/15  Intake/Output from previous day: 08/21 0701 - 08/22 0700 In: -  Out: 200 [Urine:200] Intake/Output this shift: Total I/O In: -  Out: 200 [Urine:200]  Physical Exam: Gen. appearance the patient is alert and oriented  HEENT negative  Neck supple no JVD or thyroid abnormalities  Heart regular rhythm no murmurs  Lungs clear to P&A  Abdomen no palpable organs or masses no organomegaly  Skin no abnormality  Extremities free of edema   Recent Labs  03/25/15 1920  WBC 20.7*  HGB 15.5  HCT 45.0  PLT 249   BMET  Recent Labs  03/25/15 1920  NA 135  K 4.6  CL 108  CO2 13*  GLUCOSE 242*  BUN 35*  CREATININE 4.08*  CALCIUM 8.6*    Studies/Results: Ct Abdomen Pelvis Wo Contrast  03/25/2015   CLINICAL DATA:  Low mid back pain radiating to both flanks. Diarrhea for 4 months intermittently. Umbilical pain, right lower quadrant pain, left lower quadrant pain. Nausea.  EXAM: CT ABDOMEN AND PELVIS WITHOUT CONTRAST  TECHNIQUE: Multidetector CT imaging of the abdomen and pelvis was performed following the standard protocol without IV contrast.  COMPARISON:  None.  FINDINGS: Focal peribronchial infiltration in the left lung base could represent early bronchiolitis. Coronary artery calcifications.  Calcified  granuloma in the liver. Gallbladder is mildly distended but no stones or inflammatory infiltration are identified. No bile duct dilatation. Unenhanced appearance of the spleen, pancreas, adrenal glands, kidneys, inferior vena cava, and retroperitoneal lymph nodes is unremarkable. Calcification of abdominal aorta without aneurysm. Stomach, small bowel, and colon are not abnormally distended. No free air or free fluid in the abdomen. Abdominal wall musculature appears intact.  Pelvis: Appendix is normal. Prostate gland is mildly enlarged with calcifications. Bladder wall is not thickened. Diverticula in the sigmoid colon without inflammatory change. No free or loculated pelvic fluid collections. No pelvic lymphadenopathy. Degenerative changes in the spine. No destructive bone lesions appreciated.  IMPRESSION: Focal peribronchial infiltration in the left lung base suggesting bronchiolitis. Mild nonspecific gallbladder distention without inflammatory change or stone identified. Prostate gland is enlarged.   Electronically Signed   By: Burman Nieves M.D.   On: 03/25/2015 21:40    Medications:  . aspirin EC  81 mg Oral Daily  . enoxaparin (LOVENOX) injection  30 mg Subcutaneous Q24H  . gabapentin  100 mg Oral TID  . insulin aspart  0-5 Units Subcutaneous QHS  . insulin aspart  0-9 Units Subcutaneous TID WC  . insulin detemir  60 Units Subcutaneous q morning - 10a  . niacin  500 mg Oral QHS  . omega-3 acid ethyl esters  1 g Oral BID  . pantoprazole (PROTONIX) IV  40 mg Intravenous Q24H  . sodium chloride  3 mL Intravenous Q12H    .  sodium chloride 150 mL/hr at 03/26/15 0544     Assessment/Plan: 1. Acute renal failure with creatinine 4.08 plan to continue hydration will obtain nephrology consult  2. Diabetes mellitus-plan to continue basal insulin and sliding scale insulin continue to monitor  3. Chronic diarrhea currently C. difficile is being looked for continue Imodium when necessary-  2.    LOS: 1 day   Xitlalic Maslin G 03/26/2015, 6:25 AM

## 2015-03-27 ENCOUNTER — Encounter (HOSPITAL_COMMUNITY): Payer: Self-pay | Admitting: Gastroenterology

## 2015-03-27 DIAGNOSIS — N179 Acute kidney failure, unspecified: Principal | ICD-10-CM

## 2015-03-27 DIAGNOSIS — K529 Noninfective gastroenteritis and colitis, unspecified: Secondary | ICD-10-CM

## 2015-03-27 LAB — BASIC METABOLIC PANEL
Anion gap: 8 (ref 5–15)
BUN: 17 mg/dL (ref 6–20)
CHLORIDE: 111 mmol/L (ref 101–111)
CO2: 19 mmol/L — AB (ref 22–32)
CREATININE: 1.32 mg/dL — AB (ref 0.61–1.24)
Calcium: 7.6 mg/dL — ABNORMAL LOW (ref 8.9–10.3)
GFR calc Af Amer: 56 mL/min — ABNORMAL LOW (ref >60)
GFR calc non Af Amer: 48 mL/min — ABNORMAL LOW (ref >60)
Glucose, Bld: 90 mg/dL (ref 65–99)
Potassium: 3.5 mmol/L (ref 3.5–5.1)
Sodium: 138 mmol/L (ref 135–145)

## 2015-03-27 LAB — OVA AND PARASITE EXAMINATION: Ova and parasites: NONE SEEN

## 2015-03-27 LAB — GLUCOSE, CAPILLARY
GLUCOSE-CAPILLARY: 82 mg/dL (ref 65–99)
Glucose-Capillary: 128 mg/dL — ABNORMAL HIGH (ref 65–99)
Glucose-Capillary: 121 mg/dL — ABNORMAL HIGH (ref 65–99)
Glucose-Capillary: 107 mg/dL — ABNORMAL HIGH (ref 65–99)

## 2015-03-27 MED ORDER — CALCIUM POLYCARBOPHIL 625 MG PO TABS
1250.0000 mg | ORAL_TABLET | Freq: Every day | ORAL | Status: DC
  Administered 2015-03-28 – 2015-03-31 (×3): 1250 mg via ORAL
  Filled 2015-03-27 (×7): qty 2

## 2015-03-27 MED ORDER — PANTOPRAZOLE SODIUM 40 MG PO TBEC
40.0000 mg | DELAYED_RELEASE_TABLET | Freq: Every day | ORAL | Status: DC
Start: 2015-03-28 — End: 2015-04-02
  Administered 2015-03-28 – 2015-04-02 (×6): 40 mg via ORAL
  Filled 2015-03-27 (×6): qty 1

## 2015-03-27 MED ORDER — ENOXAPARIN SODIUM 40 MG/0.4ML ~~LOC~~ SOLN
40.0000 mg | SUBCUTANEOUS | Status: DC
  Administered 2015-03-27 – 2015-04-01 (×6): 40 mg via SUBCUTANEOUS
  Filled 2015-03-27 (×6): qty 0.4

## 2015-03-27 MED ORDER — RISAQUAD PO CAPS
2.0000 | ORAL_CAPSULE | Freq: Every day | ORAL | Status: DC
  Administered 2015-03-27 – 2015-04-02 (×6): 2 via ORAL
  Filled 2015-03-27 (×6): qty 2

## 2015-03-27 NOTE — Progress Notes (Signed)
Subjective: The patient is alert and oriented this morning. He still having multiple episodes of diarrhea. He is diabetic and insulin (he is a diabetic on insulin and has hypertension. His sugars are well controlled. His creatinine has come down to 2.88 with hydration  Objective: Vital signs in last 24 hours: Temp:  [98.1 F (36.7 C)-98.4 F (36.9 C)] 98.4 F (36.9 C) (08/22 2226) Pulse Rate:  [85-93] 85 (08/22 2226) Resp:  [20] 20 (08/22 2226) BP: (132-133)/(62-69) 132/62 mmHg (08/22 2226) SpO2:  [100 %] 100 % (08/22 2226) Weight change:  Last BM Date: 03/26/15  Intake/Output from previous day: 08/22 0701 - 08/23 0700 In: 3601.5 [P.O.:960; I.V.:2641.5] Out: 500 [Urine:500] Intake/Output this shift: Total I/O In: 1550.3 [I.V.:1550.3] Out: -   Physical Exam: Gen. appearance the patient is alert and oriented  HEENT negative  Neck supple no JVD or thyroid abnormalities  Heart regular rhythm no murmurs  Lungs clear to P&A  Abdomen no palpable organs or masses no organomegaly  Skin shows no abnormality  Extremities free of edema   Recent Labs  03/25/15 1920 03/26/15 0626  WBC 20.7* 12.6*  HGB 15.5 11.0*  HCT 45.0 33.2*  PLT 249 146*   BMET  Recent Labs  03/25/15 1920 03/26/15 0626  NA 135 138  K 4.6 4.0  CL 108 116*  CO2 13* 14*  GLUCOSE 242* 128*  BUN 35* 33*  CREATININE 4.08* 2.85*  CALCIUM 8.6* 7.3*    Studies/Results: Ct Abdomen Pelvis Wo Contrast  03/25/2015   CLINICAL DATA:  Low mid back pain radiating to both flanks. Diarrhea for 4 months intermittently. Umbilical pain, right lower quadrant pain, left lower quadrant pain. Nausea.  EXAM: CT ABDOMEN AND PELVIS WITHOUT CONTRAST  TECHNIQUE: Multidetector CT imaging of the abdomen and pelvis was performed following the standard protocol without IV contrast.  COMPARISON:  None.  FINDINGS: Focal peribronchial infiltration in the left lung base could represent early bronchiolitis. Coronary artery  calcifications.  Calcified granuloma in the liver. Gallbladder is mildly distended but no stones or inflammatory infiltration are identified. No bile duct dilatation. Unenhanced appearance of the spleen, pancreas, adrenal glands, kidneys, inferior vena cava, and retroperitoneal lymph nodes is unremarkable. Calcification of abdominal aorta without aneurysm. Stomach, small bowel, and colon are not abnormally distended. No free air or free fluid in the abdomen. Abdominal wall musculature appears intact.  Pelvis: Appendix is normal. Prostate gland is mildly enlarged with calcifications. Bladder wall is not thickened. Diverticula in the sigmoid colon without inflammatory change. No free or loculated pelvic fluid collections. No pelvic lymphadenopathy. Degenerative changes in the spine. No destructive bone lesions appreciated.  IMPRESSION: Focal peribronchial infiltration in the left lung base suggesting bronchiolitis. Mild nonspecific gallbladder distention without inflammatory change or stone identified. Prostate gland is enlarged.   Electronically Signed   By: Burman Nieves M.D.   On: 03/25/2015 21:40    Medications:  . aspirin EC  81 mg Oral Daily  . enoxaparin (LOVENOX) injection  30 mg Subcutaneous Q24H  . gabapentin  100 mg Oral TID  . insulin aspart  0-5 Units Subcutaneous QHS  . insulin aspart  0-9 Units Subcutaneous TID WC  . insulin detemir  60 Units Subcutaneous q morning - 10a  . loperamide  2 mg Oral TID  . niacin  500 mg Oral QHS  . omega-3 acid ethyl esters  1 g Oral BID  . pantoprazole (PROTONIX) IV  40 mg Intravenous Q24H  . sodium chloride  3 mL  Intravenous Q12H    .  sodium bicarbonate infusion 1/4 NS 1000 mL 135 mL/hr at 03/27/15 0141     Assessment/Plan: 1. Acute renal failure-improving with hydration to continue  2 diabetes mellitus plan to continue basal insulin sliding scale continue to monitor-this problem is stable  3. Chronic diarrhea C. difficile negative to continue  Imodium when necessary we will obtain gastroenterology consult   LOS: 2 days   Eyvette Cordon G 03/27/2015, 6:25 AM

## 2015-03-27 NOTE — Progress Notes (Signed)
[name]  MRN: [id number]  DOB/AGE: Sep 17, 1929 81 y.o.  Primary Care Physician:Kramer,Brandon G, MD  Admit date: 03/25/2015  Chief Complaint:  Chief Complaint  Patient presents with  . Diarrhea  . Abdominal Pain    Kramer-Pt presented on  03/25/2015 with  Chief Complaint  Patient presents with  . Diarrhea  . Abdominal Pain  .    Pt today feels better  Meds . aspirin EC  81 mg Oral Daily  . enoxaparin (LOVENOX) injection  30 mg Subcutaneous Q24H  . gabapentin  100 mg Oral TID  . insulin aspart  0-5 Units Subcutaneous QHS  . insulin aspart  0-9 Units Subcutaneous TID WC  . insulin detemir  60 Units Subcutaneous q morning - 10a  . loperamide  2 mg Oral TID  . niacin  500 mg Oral QHS  . omega-3 acid ethyl esters  1 g Oral BID  . pantoprazole (PROTONIX) IV  40 mg Intravenous Q24H  . sodium chloride  3 mL Intravenous Q12H     Physical Exam: Vital signs in last 24 hours: Temp:  [98.1 F (36.7 C)-98.4 F (36.9 C)] 98.4 F (36.9 C) (08/23 0629) Pulse Rate:  [81-93] 81 (08/23 0629) Resp:  [20] 20 (08/23 0629) BP: (132-136)/(62-69) 136/64 mmHg (08/23 0629) SpO2:  [98 %-100 %] 98 % (08/23 0629) Weight change:  Last BM Date: 03/26/15  Intake/Output from previous day: 08/22 0701 - 08/23 0700 In: 3601.5 [P.O.:960; I.V.:2641.5] Out: 500 [Urine:500]     Physical Exam: General- pt is awake,alert, oriented to time place and person Resp- No acute REsp distress, CTA B/L NO Rhonchi CVS- S1S2 regular in rate and rhythm GIT- BS+, soft, NT, ND EXT- NO LE Edema, Cyanosis   Lab Results: CBC  Recent Labs  03/25/15 1920 03/26/15 0626  WBC 20.7* 12.6*  HGB 15.5 11.0*  HCT 45.0 33.2*  PLT 249 146*    BMET  Recent Labs  03/26/15 0626 03/27/15 0545  NA 138 138  K 4.0 3.5  CL 116* 111  CO2 14* 19*  GLUCOSE 128* 90  BUN 33* 17  CREATININE 2.85* 1.32*  CALCIUM 7.3* 7.6*   Creatinine trend 2016 4.0=>2.85=>1.32   MICRO Recent Results (from the past 240  hour(Kramer))  C difficile quick scan w PCR reflex     Status: None   Collection Time: 03/25/15  8:18 PM  Result Value Ref Range Status   C Diff antigen NEGATIVE NEGATIVE Final   C Diff toxin NEGATIVE NEGATIVE Final   C Diff interpretation Negative for toxigenic C. difficile  Final      Lab Results  Component Value Date   CALCIUM 7.6* 03/27/2015   Albumin  2.7 Corrected calcium  7.6+1.1=8.7             Impression: 1)Renal  AKI secondary to Prerenal/ATN                 AKi sec to hypovolemia/ACE/NSAIDS                AKI now better                AKI on CKD               CKD stage 2.               CKD since 2008               CKD secondary to DM/HTN/Age associated decline  Progression of CKD now marked with AKI                Proteinura Absent .               2)CVS- BP now better.   Pt was hypotensive at the time of admission, now better  3)Anemia HGb stable Decreased than before most likley it was high sec to hemoconcentration at the time of admission HGb 2016 15.5=>11 2015 11.3   4)GI-admitted with chronic diarrhea Primary MD following  5)Endo-hx of DM PMD following  6)Electrolyres Normokalemic NOrmonatremic   7)Acid base Co2 not at goal but much better NON AG acidosis sec to Bicarb loss  from  GI tract   Plan:   Will reduce IVf rate as creat , Co2 much better     [name] 03/27/2015, 8:26 AM

## 2015-03-27 NOTE — Consult Note (Signed)
Referring Provider: Dr. Renard Matter Primary Care Physician:  Alice Reichert, MD Primary Gastroenterologist:  Dr. Darrick Penna   Date of Admission: 03/25/15 Date of Consultation: 03/27/15  Reason for Consultation:  Diarrhea  HPI:  [name] is an 81 y.o. year old male admitted with diarrhea, abdominal pain. Last colonoscopy in 2009 by Dr. Darrick Penna with pancolonic diverticula and internal hemorrhoids. Cdiff by PCR negative. O&P in process. CT without acute abdominal findings. Incidentally noted calcified granuloma of liver, gallbladder mildly distended but no stones or signs of inflammation.   Notes about 4 month history of diarrhea, severity worsening over time, prompting presentation. 2 weeks ago worsened. Associated abdominal cramping but now resolved. Urgency of BMs. Sometimes unable to make it to the house. Postprandial urgency. No rectal bleeding. No antibiotics. No fever or chills. No weight loss. Prior to this would have BM a few times a day. No changes in medication in last few months. Good appetite. Multiple loose stools per day. Yesterday went 12-15 times after lunch. Stool is green from eating jello. Has been on metformin chronically. Not much improvement with imodium. Has gone through multiple bottles at home.   Past Medical History  Diagnosis Date  . Diabetes mellitus   . Hypertension   . Hypercholesterolemia   . GERD (gastroesophageal reflux disease)   . Arthritis     Past Surgical History  Procedure Laterality Date  . Tonsillectomy    . Knee arthroscopy Left   . Shoulder arthroscopy with open rotator cuff repair Left 10/15/2012    Procedure: SHOULDER ARTHROSCOPY WITH OPEN ROTATOR CUFF REPAIR;  Surgeon: Vickki Hearing, MD;  Location: [facility];  Service: Orthopedics;  Laterality: Left;  open at 0912; with bicep tendon debridement;   . Shoulder acromioplasty Left 10/15/2012    Procedure: SHOULDER ACROMIOPLASTY;  Surgeon: Vickki Hearing, MD;  Location: [facility];  Service:  Orthopedics;  Laterality: Left;  . Resection distal clavical Left 10/15/2012    Procedure: RESECTION DISTAL CLAVICAL;  Surgeon: Vickki Hearing, MD;  Location: [facility];  Service: Orthopedics;  Laterality: Left;  . Cataract extraction w/phaco Left 03/07/2014    Procedure: CATARACT EXTRACTION PHACO AND INTRAOCULAR LENS PLACEMENT LEFT EYE CDE=7.11;  Surgeon: Loraine Leriche T. Nile Riggs, MD;  Location: [facility];  Service: Ophthalmology;  Laterality: Left;  . Cataract extraction w/phaco Right 03/14/2014    Procedure: CATARACT EXTRACTION PHACO AND INTRAOCULAR LENS PLACEMENT RIGHT EYE CDE=10.62;  Surgeon: Loraine Leriche T. Nile Riggs, MD;  Location: [facility];  Service: Ophthalmology;  Laterality: Right;  . Colonoscopy  2009    Dr. Darrick Penna: pancolonic diverticulosis, internal hemorrhoids    Prior to Admission medications   Medication Sig Start Date End Date Taking? Authorizing Provider  aspirin EC 81 MG tablet Take 81 mg by mouth daily.   Yes Historical Provider, MD  fish oil-omega-3 fatty acids 1000 MG capsule Take 1 g by mouth 2 (two) times daily.    Yes Historical Provider, MD  gabapentin (NEURONTIN) 100 MG capsule Take 1 capsule (100 mg total) by mouth 3 (three) times daily. 01/09/15  Yes Vickki Hearing, MD  HYDROcodone-acetaminophen (NORCO/VICODIN) 5-325 MG per tablet Take 1 tablet by mouth every 4 (four) hours as needed for moderate pain. 08/03/14  Yes Vickki Hearing, MD  ibuprofen (ADVIL,MOTRIN) 800 MG tablet Take 1 tablet (800 mg total) by mouth every 8 (eight) hours as needed. Patient taking differently: Take 800 mg by mouth every 8 (eight) hours as needed for mild pain or moderate pain.  08/03/14  Yes Vickki Hearing, MD  insulin detemir (LEVEMIR) 100 UNIT/ML injection Inject 60 Units into the skin every morning.   Yes Historical Provider, MD  insulin lispro (HUMALOG) 100 UNIT/ML injection Inject 1-8 Units into the skin 3 (three) times daily before meals.   Yes Historical Provider, MD  lisinopril (PRINIVIL,ZESTRIL)  40 MG tablet Take 40 mg by mouth daily.     Yes Historical Provider, MD  metFORMIN (GLUCOPHAGE) 1000 MG tablet Take 1,000 mg by mouth 2 (two) times daily with a meal.     Yes Historical Provider, MD  Multiple Vitamin (MULTIVITAMIN WITH MINERALS) TABS tablet Take 1 tablet by mouth daily.   Yes Historical Provider, MD  NIASPAN 500 MG CR tablet Take 500 mg by mouth at bedtime.  06/08/11  Yes Historical Provider, MD  ONGLYZA 5 MG TABS tablet Take 5 mg by mouth daily.  06/08/11  Yes Historical Provider, MD  ranitidine (ZANTAC) 150 MG capsule Take 150 mg by mouth daily.    Yes Historical Provider, MD  simvastatin (ZOCOR) 80 MG tablet Take 80 mg by mouth at bedtime.     Yes Historical Provider, MD    Current Facility-Administered Medications  Medication Dose Route Frequency Provider Last Rate Last Dose  . aspirin EC tablet 81 mg  81 mg Oral Daily Wilson Singer, MD   81 mg at 03/27/15 0923  . enoxaparin (LOVENOX) injection 30 mg  30 mg Subcutaneous Q24H Nimish C Gosrani, MD   30 mg at 03/26/15 2143  . gabapentin (NEURONTIN) capsule 100 mg  100 mg Oral TID Wilson Singer, MD   100 mg at 03/27/15 0923  . insulin aspart (novoLOG) injection 0-5 Units  0-5 Units Subcutaneous QHS Wilson Singer, MD   0 Units at 03/25/15 2200  . insulin aspart (novoLOG) injection 0-9 Units  0-9 Units Subcutaneous TID WC Nimish Normajean Glasgow, MD   1 Units at 03/26/15 1724  . insulin detemir (LEVEMIR) injection 60 Units  60 Units Subcutaneous q morning - 10a Wilson Singer, MD   60 Units at 03/27/15 0923  . loperamide (IMODIUM) capsule 2 mg  2 mg Oral TID Butch Penny, MD   2 mg at 03/27/15 1610  . niacin (NIASPAN) CR tablet 500 mg  500 mg Oral QHS Nimish C Gosrani, MD   500 mg at 03/26/15 2144  . omega-3 acid ethyl esters (LOVAZA) capsule 1 g  1 g Oral BID Wilson Singer, MD   1 g at 03/27/15 0923  . ondansetron (ZOFRAN) tablet 4 mg  4 mg Oral Q6H PRN Nimish Normajean Glasgow, MD       Or  . ondansetron (ZOFRAN) injection 4 mg   4 mg Intravenous Q6H PRN Nimish C Gosrani, MD      . pantoprazole (PROTONIX) injection 40 mg  40 mg Intravenous Q24H Nimish C Karilyn Cota, MD   40 mg at 03/26/15 2143  . sodium chloride 0.225 % with sodium bicarbonate 50 mEq infusion   Intravenous Continuous Randa Lynn, MD 100 mL/hr at 03/27/15 0923    . sodium chloride 0.9 % injection 3 mL  3 mL Intravenous Q12H Nimish C Gosrani, MD   3 mL at 03/25/15 2326    Allergies as of 03/25/2015  . (No Known Allergies)    Family History  Problem Relation Age of Onset  . Arthritis    . Cancer    . Diabetes    . Colon cancer Neg Hx     Social  History   Social History  . Marital Status: Married    Spouse Name: N/A  . Number of Children: N/A  . Years of Education: N/A   Occupational History  . Not on file.   Social History Main Topics  . Smoking status: Former Smoker -- 3.00 packs/day for 30 years    Types: Cigarettes    Quit date: 10/11/1993  . Smokeless tobacco: Not on file  . Alcohol Use: No  . Drug Use: No  . Sexual Activity: Yes    Birth Control/ Protection: None   Other Topics Concern  . Not on file   Social History Narrative    Review of Systems: Negative unless mentioned in HPI  Physical Exam: Vital signs in last 24 hours: Temp:  [98.1 F (36.7 C)-98.4 F (36.9 C)] 98.4 F (36.9 C) (08/23 0629) Pulse Rate:  [81-93] 81 (08/23 0629) Resp:  [20] 20 (08/23 0629) BP: (132-136)/(62-69) 136/64 mmHg (08/23 0629) SpO2:  [98 %-100 %] 98 % (08/23 0629) Last BM Date: 03/26/15 General:   Alert,  Well-developed, well-nourished, pleasant and cooperative in NAD Head:  Normocephalic and atraumatic. Eyes:  Sclera clear, no icterus.   Conjunctiva pink. Ears:  Hard of hearing Nose:  No deformity, discharge,  or lesions. Mouth:  No deformity or lesions, dentition normal. Lungs:  Clear throughout to auscultation.   No wheezes, crackles, or rhonchi. No acute distress. Heart:  Regular rate and rhythm; no murmurs, clicks,  rubs,  or gallops. Abdomen:  Soft, obese, nontender and nondistended. No masses, hepatosplenomegaly or hernias noted. Normal bowel sounds, without guarding, and without rebound.   Rectal:  Deferred until time of colonoscopy.   Extremities:  Without edema. Neurologic:  Alert and  oriented x4;  grossly normal neurologically. Psych:  Alert and cooperative. Normal mood and affect.  Intake/Output from previous day: 08/22 0701 - 08/23 0700 In: 3601.5 [P.O.:960; I.V.:2641.5] Out: 500 [Urine:500] Intake/Output this shift:    Lab Results:  Recent Labs  03/25/15 1920 03/26/15 0626  WBC 20.7* 12.6*  HGB 15.5 11.0*  HCT 45.0 33.2*  PLT 249 146*   BMET  Recent Labs  03/25/15 1920 03/26/15 0626 03/27/15 0545  NA 135 138 138  K 4.6 4.0 3.5  CL 108 116* 111  CO2 13* 14* 19*  GLUCOSE 242* 128* 90  BUN 35* 33* 17  CREATININE 4.08* 2.85* 1.32*  CALCIUM 8.6* 7.3* 7.6*   LFT  Recent Labs  03/25/15 1920 03/26/15 0626  PROT 7.0 5.2*  ALBUMIN 3.5 2.7*  AST 15 11*  ALT 10* 8*  ALKPHOS 67 46  BILITOT 0.4 0.5   C-Diff  Recent Labs  03/25/15 2018  CDIFFTOX NEGATIVE    Studies/Results: Ct Abdomen Pelvis Wo Contrast  03/25/2015   CLINICAL DATA:  Low mid back pain radiating to both flanks. Diarrhea for 4 months intermittently. Umbilical pain, right lower quadrant pain, left lower quadrant pain. Nausea.  EXAM: CT ABDOMEN AND PELVIS WITHOUT CONTRAST  TECHNIQUE: Multidetector CT imaging of the abdomen and pelvis was performed following the standard protocol without IV contrast.  COMPARISON:  None.  FINDINGS: Focal peribronchial infiltration in the left lung base could represent early bronchiolitis. Coronary artery calcifications.  Calcified granuloma in the liver. Gallbladder is mildly distended but no stones or inflammatory infiltration are identified. No bile duct dilatation. Unenhanced appearance of the spleen, pancreas, adrenal glands, kidneys, inferior vena cava, and  retroperitoneal lymph nodes is unremarkable. Calcification of abdominal aorta without aneurysm. Stomach, small bowel, and colon  are not abnormally distended. No free air or free fluid in the abdomen. Abdominal wall musculature appears intact.  Pelvis: Appendix is normal. Prostate gland is mildly enlarged with calcifications. Bladder wall is not thickened. Diverticula in the sigmoid colon without inflammatory change. No free or loculated pelvic fluid collections. No pelvic lymphadenopathy. Degenerative changes in the spine. No destructive bone lesions appreciated.  IMPRESSION: Focal peribronchial infiltration in the left lung base suggesting bronchiolitis. Mild nonspecific gallbladder distention without inflammatory change or stone identified. Prostate gland is enlarged.   Electronically Signed   By: Burman Nieves M.D.   On: 03/25/2015 21:40    Impression: 81 year old male admitted with acute renal failure in the setting of chronic, non-bloody diarrhea, with negative Cdiff PCR on file and O&P pending. Persistent loose stool noted; no CT findings of colitis. Leukocytosis on admission noted but now improved significantly. Doubt false negative Cdiff in this scenario. Differentials broad to include infectious etiology, med effect, microscopic colitis, pancreatic insufficiency, diabetic enteropathy, evolving IBD, doubt malignancy. Recommend adding stool culture and would recommend colonoscopy with random biopsies if no improvement or stool studies inconclusive. Would hold on Imodium until stool culture is complete. Last colonoscopy in 2009 as noted above.   Plan: Stool culture now (GI pathogen panel takes longer to process and is sent out to another state) Hold Imodium Low-residue diet Consider empiric trial of pancreatic enzymes in interim Anticipate colonoscopy if no improvement or inconclusive stool studies  Nira Retort, ANP-BC [facility]     LOS: 2 days    03/27/2015, 10:43  AM

## 2015-03-28 DIAGNOSIS — D72829 Elevated white blood cell count, unspecified: Secondary | ICD-10-CM | POA: Insufficient documentation

## 2015-03-28 DIAGNOSIS — R197 Diarrhea, unspecified: Secondary | ICD-10-CM | POA: Insufficient documentation

## 2015-03-28 LAB — GLUCOSE, CAPILLARY
GLUCOSE-CAPILLARY: 78 mg/dL (ref 65–99)
GLUCOSE-CAPILLARY: 143 mg/dL — AB (ref 65–99)
GLUCOSE-CAPILLARY: 110 mg/dL — AB (ref 65–99)
Glucose-Capillary: 123 mg/dL — ABNORMAL HIGH (ref 65–99)

## 2015-03-28 LAB — BASIC METABOLIC PANEL
Anion gap: 5 (ref 5–15)
BUN: 10 mg/dL (ref 6–20)
CO2: 23 mmol/L (ref 22–32)
Calcium: 7.6 mg/dL — ABNORMAL LOW (ref 8.9–10.3)
Chloride: 112 mmol/L — ABNORMAL HIGH (ref 101–111)
Creatinine, Ser: 0.98 mg/dL (ref 0.61–1.24)
GFR calc Af Amer: >60 mL/min (ref >60)
GFR calc non Af Amer: >60 mL/min (ref >60)
Glucose, Bld: 76 mg/dL (ref 65–99)
Potassium: 3.4 mmol/L — ABNORMAL LOW (ref 3.5–5.1)
Sodium: 140 mmol/L (ref 135–145)

## 2015-03-28 MED ORDER — POTASSIUM CHLORIDE 20 MEQ PO PACK
20.0000 meq | PACK | Freq: Two times a day (BID) | ORAL | Status: AC
  Administered 2015-03-28 (×2): 20 meq via ORAL
  Filled 2015-03-28 (×2): qty 1

## 2015-03-28 MED ORDER — PEG 3350-KCL-NA BICARB-NACL 420 G PO SOLR
2000.0000 mL | Freq: Once | ORAL | Status: AC
  Administered 2015-03-29: 2000 mL via ORAL
  Filled 2015-03-28 (×2): qty 4000

## 2015-03-28 MED ORDER — PEG 3350-KCL-NA BICARB-NACL 420 G PO SOLR
2000.0000 mL | Freq: Once | ORAL | Status: AC
  Administered 2015-03-28: 2000 mL via ORAL
  Filled 2015-03-28 (×2): qty 4000

## 2015-03-28 NOTE — Progress Notes (Signed)
Spoke with Claris Che at Infection Control, pt no longer warrants contact precautions.  Contact Precautions discontinued at this time.

## 2015-03-28 NOTE — Progress Notes (Signed)
Subjective: The patient is alert and oriented this morning. He still having multiple episodes of diarrhea. He also is diabetic on insulin and has hypertension. He was admitted in renal failure but this has improved with hydration. His creatinine now is 1.32. He is being seen by gastroenterology service as well as nephrology  Objective: Vital signs in last 24 hours: Temp:  [97.5 F (36.4 C)-98.1 F (36.7 C)] 97.5 F (36.4 C) (08/24 0551) Pulse Rate:  [80-87] 80 (08/24 0551) Resp:  [20] 20 (08/24 0551) BP: (134-160)/(70-78) 160/70 mmHg (08/24 0551) SpO2:  [100 %] 100 % (08/24 0551) Weight change:  Last BM Date: 03/26/15  Intake/Output from previous day: 08/23 0701 - 08/24 0700 In: 1080 [P.O.:1080] Out: 900 [Urine:900] Intake/Output this shift:    Physical Exam: Gen. appearance the patient is alert and oriented  HEENT negative  Neck supple no JVD or thyroid abnormalities  Heart regular rhythm no murmurs  Lungs clear to P&A  Abdomen no palpable organs or masses no organomegaly or tenderness  Skin shows no abnormality  Extremities free of edema   Recent Labs  03/25/15 1920 03/26/15 0626  WBC 20.7* 12.6*  HGB 15.5 11.0*  HCT 45.0 33.2*  PLT 249 146*   BMET  Recent Labs  03/26/15 0626 03/27/15 0545  NA 138 138  K 4.0 3.5  CL 116* 111  CO2 14* 19*  GLUCOSE 128* 90  BUN 33* 17  CREATININE 2.85* 1.32*  CALCIUM 7.3* 7.6*    Studies/Results: No results found.  Medications:  . acidophilus  2 capsule Oral Daily  . aspirin EC  81 mg Oral Daily  . enoxaparin (LOVENOX) injection  40 mg Subcutaneous Q24H  . gabapentin  100 mg Oral TID  . insulin aspart  0-5 Units Subcutaneous QHS  . insulin aspart  0-9 Units Subcutaneous TID WC  . insulin detemir  60 Units Subcutaneous q morning - 10a  . niacin  500 mg Oral QHS  . omega-3 acid ethyl esters  1 g Oral BID  . pantoprazole  40 mg Oral QAC breakfast  . polycarbophil  1,250 mg Oral Daily  . sodium chloride  3  mL Intravenous Q12H    .  sodium bicarbonate infusion 1/4 NS 1000 mL 100 mL/hr at 03/27/15 1610     Assessment/Plan: 1 acute renal failure-improving with hydration  2. Diabetes mellitus-plan to continue basal insulin and sliding scale insulin  3. Chronic diarrhea-gastroenterology service is planning on possible colonoscopy later in the week-studies negative at present   LOS: 3 days   Tiffine Henigan G 03/28/2015, 6:40 AM

## 2015-03-28 NOTE — Progress Notes (Signed)
Subjective:  Still having profuse nonbloody diarrhea. BM every 15 minutes. "chuncks of food and watery stool". No abdominal pain. No N/V.   Objective: Vital signs in last 24 hours: Temp:  [97.5 F (36.4 C)-98.1 F (36.7 C)] 97.5 F (36.4 C) (08/24 0551) Pulse Rate:  [80-87] 80 (08/24 0551) Resp:  [20] 20 (08/24 0551) BP: (134-160)/(70-78) 160/70 mmHg (08/24 0551) SpO2:  [100 %] 100 % (08/24 0551) Last BM Date: 03/26/15 General:   Alert,  Well-developed, well-nourished, pleasant and cooperative in NAD Head:  Normocephalic and atraumatic. Eyes:  Sclera clear, no icterus.  Abdomen:  Soft, nontender and nondistended. Extremities:  Without clubbing, deformity or edema. Neurologic:  Alert and  oriented x4;  grossly normal neurologically. Skin:  Intact without significant lesions or rashes. Psych:  Alert and cooperative. Normal mood and affect.  Intake/Output from previous day: 08/23 0701 - 08/24 0700 In: 1080 [P.O.:1080] Out: 900 [Urine:900] Intake/Output this shift:    Lab Results: CBC  Recent Labs  03/25/15 1920 03/26/15 0626  WBC 20.7* 12.6*  HGB 15.5 11.0*  HCT 45.0 33.2*  MCV 84.3 85.6  PLT 249 146*   BMET  Recent Labs  03/25/15 1920 03/26/15 0626 03/27/15 0545  NA 135 138 138  K 4.6 4.0 3.5  CL 108 116* 111  CO2 13* 14* 19*  GLUCOSE 242* 128* 90  BUN 35* 33* 17  CREATININE 4.08* 2.85* 1.32*  CALCIUM 8.6* 7.3* 7.6*   LFTs  Recent Labs  03/25/15 1920 03/26/15 0626  BILITOT 0.4 0.5  ALKPHOS 67 46  AST 15 11*  ALT 10* 8*  PROT 7.0 5.2*  ALBUMIN 3.5 2.7*    Recent Labs  03/25/15 1920  LIPASE 16*   PT/INR No results for input(s): LABPROT, INR in the last 72 hours.    O+P negative.  Cdiff negative.  Lab Results  Component Value Date   TSH 3.136 03/25/2015     Imaging Studies: Ct Abdomen Pelvis Wo Contrast  03/25/2015   CLINICAL DATA:  Low mid back pain radiating to both flanks. Diarrhea for 4 months intermittently. Umbilical pain,  right lower quadrant pain, left lower quadrant pain. Nausea.  EXAM: CT ABDOMEN AND PELVIS WITHOUT CONTRAST  TECHNIQUE: Multidetector CT imaging of the abdomen and pelvis was performed following the standard protocol without IV contrast.  COMPARISON:  None.  FINDINGS: Focal peribronchial infiltration in the left lung base could represent early bronchiolitis. Coronary artery calcifications.  Calcified granuloma in the liver. Gallbladder is mildly distended but no stones or inflammatory infiltration are identified. No bile duct dilatation. Unenhanced appearance of the spleen, pancreas, adrenal glands, kidneys, inferior vena cava, and retroperitoneal lymph nodes is unremarkable. Calcification of abdominal aorta without aneurysm. Stomach, small bowel, and colon are not abnormally distended. No free air or free fluid in the abdomen. Abdominal wall musculature appears intact.  Pelvis: Appendix is normal. Prostate gland is mildly enlarged with calcifications. Bladder wall is not thickened. Diverticula in the sigmoid colon without inflammatory change. No free or loculated pelvic fluid collections. No pelvic lymphadenopathy. Degenerative changes in the spine. No destructive bone lesions appreciated.  IMPRESSION: Focal peribronchial infiltration in the left lung base suggesting bronchiolitis. Mild nonspecific gallbladder distention without inflammatory change or stone identified. Prostate gland is enlarged.   Electronically Signed   By: Burman Nieves M.D.   On: 03/25/2015 21:40  [2 weeks]   Assessment:  81 year old male admitted with acute renal failure in the setting of chronic, non-bloody diarrhea, with negative Cdiff and  O&P. Stool culture pending. Persistent loose stool noted; no CT findings of colitis. Leukocytosis on admission noted but now improved significantly. Differentials broad to include infectious etiology, med effect, microscopic colitis, pancreatic insufficiency, diabetic enteropathy, evolving IBD,  doubt malignancy. Would recommend colonoscopy with random biopsies if no improvement or stool studies inconclusive. Would hold on Imodium until stool culture is complete. Last colonoscopy in 2009 as noted above.   Plan: 1. To discuss with Dr. Jena Gauss, consider colonoscopy tomorrow due to severity of symptoms. Stool culture and celiac labs still pending.   Leanna Battles. Dixon Boos [facility] [phone] 8/24/20169:02 AM     LOS: 3 days    Addendum: Discussed with Dr. Jena Gauss. Plan for colonoscopy tomorrow. Will hold Lovenox tonight. Replete potassium.  Leanna Battles. Dixon Boos [facility] [phone] 8/24/20161:24 PM  Attending note:  Late entry. Patient seen and examined by me around 1515 yesterday. Addendum could not be done because of Epic failure. Agree with need for diagnostic colonoscopy.The risks, benefits, limitations, alternatives and imponderables have been reviewed with the patient. Questions have been answered. All parties  agreeable.

## 2015-03-28 NOTE — Progress Notes (Addendum)
[name]  MRN: [id number]  DOB/AGE: 1930/05/21 81 y.o.  Primary Care Physician:MCINNIS,ANGUS G, MD  Admit date: 03/25/2015  Chief Complaint:  Chief Complaint  Patient presents with  . Diarrhea  . Abdominal Pain    S-Pt presented on  03/25/2015 with  Chief Complaint  Patient presents with  . Diarrhea  . Abdominal Pain  .    Pt says " I still have diarrhea  Meds . acidophilus  2 capsule Oral Daily  . aspirin EC  81 mg Oral Daily  . enoxaparin (LOVENOX) injection  40 mg Subcutaneous Q24H  . gabapentin  100 mg Oral TID  . insulin aspart  0-5 Units Subcutaneous QHS  . insulin aspart  0-9 Units Subcutaneous TID WC  . insulin detemir  60 Units Subcutaneous q morning - 10a  . niacin  500 mg Oral QHS  . omega-3 acid ethyl esters  1 g Oral BID  . pantoprazole  40 mg Oral QAC breakfast  . polycarbophil  1,250 mg Oral Daily  . sodium chloride  3 mL Intravenous Q12H     Physical Exam: Vital signs in last 24 hours: Temp:  [97.5 F (36.4 C)-98.1 F (36.7 C)] 97.5 F (36.4 C) (08/24 0551) Pulse Rate:  [80-87] 80 (08/24 0551) Resp:  [20] 20 (08/24 0551) BP: (134-160)/(70-78) 160/70 mmHg (08/24 0551) SpO2:  [100 %] 100 % (08/24 0551) Weight change:  Last BM Date: 03/26/15  Intake/Output from previous day: 08/23 0701 - 08/24 0700 In: 1080 [P.O.:1080] Out: 900 [Urine:900]     Physical Exam: General- pt is awake,alert, oriented to time place and person Resp- No acute REsp distress, CTA B/L NO Rhonchi CVS- S1S2 regular in rate and rhythm GIT- BS+, soft, NT, ND EXT- NO LE Edema, NO Cyanosis   Lab Results: CBC  Recent Labs  03/25/15 1920 03/26/15 0626  WBC 20.7* 12.6*  HGB 15.5 11.0*  HCT 45.0 33.2*  PLT 249 146*    BMET  Recent Labs  03/26/15 0626 03/27/15 0545  NA 138 138  K 4.0 3.5  CL 116* 111  CO2 14* 19*  GLUCOSE 128* 90  BUN 33* 17  CREATININE 2.85* 1.32*  CALCIUM 7.3* 7.6*   Creatinine trend 2016 4.0=>2.85=>1.32   MICRO Recent  Results (from the past 240 hour(s))  C difficile quick scan w PCR reflex     Status: None   Collection Time: 03/25/15  8:18 PM  Result Value Ref Range Status   C Diff antigen NEGATIVE NEGATIVE Final   C Diff toxin NEGATIVE NEGATIVE Final   C Diff interpretation Negative for toxigenic C. difficile  Final  Ova and parasite examination     Status: None   Collection Time: 03/26/15  7:00 AM  Result Value Ref Range Status   Specimen Description STOOL  Final   Special Requests NONE  Final   Ova and parasites   Final    NO OVA OR PARASITES SEEN Performed at [facility]    Report Status 03/27/2015 FINAL  Final      Lab Results  Component Value Date   CALCIUM 7.6* 03/27/2015   Albumin  2.7 Corrected calcium  7.6+1.1=8.7       Impression: 1)Renal  AKI secondary to Prerenal/ATN                 AKi sec to hypovolemia/ACE/NSAIDS                AKI now better  AKI on CKD               CKD stage 2.               CKD since 2008               CKD secondary to DM/HTN/Age associated decline                Progression of CKD now marked with AKI                Proteinura Absent .               2)CVS- BP now high   Pt was hypotensive at the time of admission, now high   3)Anemia HGb stable Decreased than before most likley it was high sec to hemoconcentration at the time of admission HGb 2016 15.5=>11 2015 11.3   4)GI-admitted with chronic diarrhea Primary MD following  5)Endo-hx of DM PMD following  6)Electrolyres Normokalemic NOrmonatremic   7)Acid base Co2 not at goal but much better NON AG acidosis sec to Bicarb loss  from  GI tract   Plan:  BMET pending from this am Will suggest to start amlodipine 5mg  po daily- if SBP stays higher than 160   Saw [name] 03/28/2015, 8:45 AM

## 2015-03-29 ENCOUNTER — Encounter (HOSPITAL_COMMUNITY)
Admission: EM | Disposition: A | Discharge status: Home / Self Care | Payer: Self-pay | Source: Home / Self Care | Attending: Family Medicine

## 2015-03-29 ENCOUNTER — Encounter (HOSPITAL_COMMUNITY): Payer: Self-pay | Admitting: *Deleted

## 2015-03-29 DIAGNOSIS — K5731 Diverticulosis of large intestine without perforation or abscess with bleeding: Secondary | ICD-10-CM

## 2015-03-29 DIAGNOSIS — K639 Disease of intestine, unspecified: Secondary | ICD-10-CM

## 2015-03-29 HISTORY — PX: COLONOSCOPY: SHX5424

## 2015-03-29 LAB — BASIC METABOLIC PANEL
ANION GAP: 8 (ref 5–15)
BUN: 7 mg/dL (ref 6–20)
CHLORIDE: 109 mmol/L (ref 101–111)
CO2: 23 mmol/L (ref 22–32)
Calcium: 7.8 mg/dL — ABNORMAL LOW (ref 8.9–10.3)
Creatinine, Ser: 0.94 mg/dL (ref 0.61–1.24)
GFR calc non Af Amer: >60 mL/min (ref >60)
Glucose, Bld: 71 mg/dL (ref 65–99)
POTASSIUM: 3.6 mmol/L (ref 3.5–5.1)
Sodium: 140 mmol/L (ref 135–145)

## 2015-03-29 LAB — TISSUE TRANSGLUTAMINASE, IGA

## 2015-03-29 LAB — GLUCOSE, CAPILLARY
GLUCOSE-CAPILLARY: 83 mg/dL (ref 65–99)
GLUCOSE-CAPILLARY: 86 mg/dL (ref 65–99)
GLUCOSE-CAPILLARY: 95 mg/dL (ref 65–99)
Glucose-Capillary: 179 mg/dL — ABNORMAL HIGH (ref 65–99)

## 2015-03-29 SURGERY — COLONOSCOPY
Anesthesia: Moderate Sedation

## 2015-03-29 MED ORDER — SODIUM BICARBONATE 8.4 % IV SOLN
INTRAVENOUS | Status: AC
  Filled 2015-03-29: qty 50

## 2015-03-29 MED ORDER — SODIUM CHLORIDE 0.9 % IV SOLN
INTRAVENOUS | Status: DC
  Administered 2015-03-29: 16:00:00 via INTRAVENOUS

## 2015-03-29 MED ORDER — STERILE WATER FOR IRRIGATION IR SOLN
Status: DC | PRN
  Administered 2015-03-29: 16:00:00

## 2015-03-29 MED ORDER — BUDESONIDE 3 MG PO CPEP
9.0000 mg | ORAL_CAPSULE | Freq: Every day | ORAL | Status: DC
  Administered 2015-03-29 – 2015-04-02 (×5): 9 mg via ORAL
  Filled 2015-03-29 (×7): qty 3

## 2015-03-29 MED ORDER — ONDANSETRON HCL 4 MG/2ML IJ SOLN
INTRAMUSCULAR | Status: AC
  Filled 2015-03-29: qty 2

## 2015-03-29 MED ORDER — ONDANSETRON HCL 4 MG/2ML IJ SOLN
INTRAMUSCULAR | Status: DC | PRN
  Administered 2015-03-29: 4 mg via INTRAVENOUS

## 2015-03-29 MED ORDER — MEPERIDINE HCL 100 MG/ML IJ SOLN
INTRAMUSCULAR | Status: DC | PRN
  Administered 2015-03-29 (×3): 25 mg via INTRAVENOUS

## 2015-03-29 MED ORDER — MIDAZOLAM HCL 5 MG/5ML IJ SOLN
INTRAMUSCULAR | Status: AC
  Filled 2015-03-29: qty 10

## 2015-03-29 MED ORDER — MIDAZOLAM HCL 5 MG/5ML IJ SOLN
INTRAMUSCULAR | Status: DC | PRN
  Administered 2015-03-29 (×2): 1 mg via INTRAVENOUS
  Administered 2015-03-29: 2 mg via INTRAVENOUS
  Administered 2015-03-29: 1 mg via INTRAVENOUS

## 2015-03-29 MED ORDER — MEPERIDINE HCL 100 MG/ML IJ SOLN
INTRAMUSCULAR | Status: AC
  Filled 2015-03-29: qty 2

## 2015-03-29 NOTE — Progress Notes (Signed)
Subjective: The patient is alert and oriented this morning. He is still having multiple episodes of diarrhea. He also is diabetic on insulin and has hypertension. His admitted with renal failure which has improved with hydration creatinine now normal 0.94. He is being prepped for colonoscopy by gastroenterology service.  Objective: Vital signs in last 24 hours: Temp:  [97.5 F (36.4 C)-98.1 F (36.7 C)] 98.1 F (36.7 C) (08/25 0453) Pulse Rate:  [73-83] 83 (08/25 0453) Resp:  [20] 20 (08/25 0453) BP: (119-172)/(66-81) 119/66 mmHg (08/25 0453) SpO2:  [97 %-100 %] 97 % (08/25 0453) Weight change:  Last BM Date: 03/28/15  Intake/Output from previous day: 08/24 0701 - 08/25 0700 In: 4560 [P.O.:3360; I.V.:1200] Out: -  Intake/Output this shift: Total I/O In: 480 [P.O.:480] Out: -   Physical Exam: Gen. appearance patient is alert and oriented  HEENT negative  Neck supple no JVD or thyroid abnormalities  Heart regular rhythm no murmurs  Lungs clear to P&A  Abdomen no palpable organs or masses no organomegaly or tenderness  Skin shows no abnormality  Extremities free of edema   Recent Labs  03/26/15 0626  WBC 12.6*  HGB 11.0*  HCT 33.2*  PLT 146*   BMET  Recent Labs  03/28/15 0542 03/29/15 0434  NA 140 140  K 3.4* 3.6  CL 112* 109  CO2 23 23  GLUCOSE 76 71  BUN 10 7  CREATININE 0.98 0.94  CALCIUM 7.6* 7.8*    Studies/Results: No results found.  Medications:  . acidophilus  2 capsule Oral Daily  . aspirin EC  81 mg Oral Daily  . enoxaparin (LOVENOX) injection  40 mg Subcutaneous Q24H  . gabapentin  100 mg Oral TID  . insulin aspart  0-5 Units Subcutaneous QHS  . insulin aspart  0-9 Units Subcutaneous TID WC  . insulin detemir  60 Units Subcutaneous q morning - 10a  . niacin  500 mg Oral QHS  . omega-3 acid ethyl esters  1 g Oral BID  . pantoprazole  40 mg Oral QAC breakfast  . polycarbophil  1,250 mg Oral Daily  . sodium chloride  3 mL  Intravenous Q12H    .  sodium bicarbonate infusion 1/4 NS 1000 mL 100 mL/hr at 03/29/15 0535     Assessment/Plan: 1. Acute renal failure improved with hydration  2. Diabetes mellitus-plan to continue basal insulin sliding scale continue to monitor sugars  3. Chronic diarrhea of undetermined etiology. Studies are being done by gastroenterology service he is scheduled for colonoscopy today.   LOS: 4 days   Angenette Daily G 03/29/2015, 6:23 AM

## 2015-03-29 NOTE — Progress Notes (Signed)
Subjective: Patient presently denies any nausea or vomiting. However he has continuous diarrhea. He denies any abdominal pain.   Objective: Vital signs in last 24 hours: Temp:  [97.5 F (36.4 C)-98.1 F (36.7 C)] 98.1 F (36.7 C) (08/25 0453) Pulse Rate:  [73-83] 83 (08/25 0453) Resp:  [20] 20 (08/25 0453) BP: (119-172)/(66-81) 119/66 mmHg (08/25 0453) SpO2:  [97 %-100 %] 97 % (08/25 0453)  Intake/Output from previous day: 08/24 0701 - 08/25 0700 In: 4560 [P.O.:3360; I.V.:1200] Out: -  Intake/Output this shift:    No results for input(s): HGB in the last 72 hours. No results for input(s): WBC, RBC, HCT, PLT in the last 72 hours.  Recent Labs  03/28/15 0542 03/29/15 0434  NA 140 140  K 3.4* 3.6  CL 112* 109  CO2 23 23  BUN 10 7  CREATININE 0.98 0.94  GLUCOSE 76 71  CALCIUM 7.6* 7.8*   No results for input(s): LABPT, INR in the last 72 hours.  Generally patient is alert and in no apparent distress. Chest is clear to auscultation His heart exam regular [place] is no murmur or S3 Abdomen: Soft with bowel sounds Extremities no edema  Assessment/Plan: Problem #1 acute kidney injury superimposed on chronic. Presently his renal function has recovered. His BUN and creatinine is at his baseline. Problem #2 hypokalemia: Patient is on potassium supplement and his potassium is normal. Problem #3 chronic diarrhea: Patient is still has diarrhea. There is no blood and no abdominal pain. Patient also does not have any nausea or vomiting. Problem #4 hypertension: His blood pressure is reasonably controlled. Problem #5 history of diabetes Problem #6 neuropathy Plan: We'll decrease IV fluid to 55 mL per hour Since his renal function has recovered I will sign off. His IV fluid can be discontinued once the diarrhea improves. Thank you     [facility] S 03/29/2015, 8:32 AM

## 2015-03-29 NOTE — Op Note (Signed)
[facility] [address] [place], [address]   COLONOSCOPY PROCEDURE REPORT  PATIENT: [name], [name]  MR#: [id number] BIRTHDATE: Jan 15, 1930 , 85  yrs. old GENDER: male ENDOSCOPIST: R.  Roetta Sessions, MD FACP [facility] REFERRED WN:UUVOZ Renard Matter, M.D. PROCEDURE DATE:  04/15/15 PROCEDURE:   Ileo-colonoscopy with biopsy INDICATIONS:Chronic, nonbloody diarrhea. MEDICATIONS: Versed 5 mg IV and Demerol 75 mg IV in divided doses. Zofran 4 mg IV. ASA CLASS:       Class II  CONSENT: The risks, benefits, alternatives and imponderables including but not limited to bleeding, perforation as well as the possibility of a missed lesion have been reviewed.  The potential for biopsy, lesion removal, etc. have also been discussed. Questions have been answered.  All parties agreeable.  Please see the history and physical in the medical record for more information.  DESCRIPTION OF PROCEDURE:   After the risks benefits and alternatives of the procedure were thoroughly explained, informed consent was obtained.  The digital rectal exam revealed no abnormalities of the rectum.   The EC-[id number] ([id number])  endoscope was introduced through the anus and advanced to the terminal ileum which was intubated for a short distance. No adverse events experienced.   The quality of the prep was adequate  The instrument was then slowly withdrawn as the colon was fully examined. Estimated blood loss is zero unless otherwise noted in this procedure report.      COLON FINDINGS: Distal rectal mucosa.  Proximal rectal mucosa abnormal with a couple of aphthous appearing erosions present. Examination of the colonic mucosa revealed extensive left-sided and transverse diverticula; the patient had multiple 1-3 mm aphthous erosions and ulcerations throughout his colon.  I intubated the terminal ileum approximately 5 cm.  There were erosions in the segment of the GI tract as well.  Other than the areas of  aphthous inflammation in his rectum, colon and noted diverticula, the colonic mucosa appeared normal.  Because of angulation of the ileocecal valve, I was unable to perform biopsies of the terminal ileum.  However, multiple biopsies of the abnormal appearing ascending and descending/sigmoid segments were taken for histologic study.  Retroflexion was performed. .  Withdrawal time=14 minutes 0 seconds.  The scope was withdrawn and the procedure completed. COMPLICATIONS: There were no immediate complications. EBL 5 mL ENDOSCOPIC IMPRESSION: Inflammatory changes in the proximal rectum, colon and terminal ileum suspicious for inflammatory bowel disease i.e. primarily Crohn's colitis. Status post biopsy as described. Concomitant NSAID exposure could exacerbate pre-existing inflammatory bowel disease but I do not think today's findings are primarily related to recent ibuprofen use.  RECOMMENDATIONS: Begin budesonide 9 mg daily for empiric treatment of inflammatory bowel disease pending review of pathology report and GI pathogen panel.  Would discourage the use of non-steroidal agents. Low residue diet. Further recommendations to follow.  eSigned:  R. Roetta Sessions, MD [facility] 2015-04-15 5:07 PM   cc:  CPT CODES: ICD CODES:  The ICD and CPT codes recommended by this software are interpretations from the data that the clinical staff has captured with the software.  The verification of the translation of this report to the ICD and CPT codes and modifiers is the sole responsibility of the health care institution and practicing physician where this report was generated.  [employer] will not be held responsible for the validity of the ICD and CPT codes included on this report.  AMA assumes no liability for data contained or not contained herein. CPT is a Publishing rights manager of  the American Medical Association.  PATIENT NAME:  [name], [name] MR#: [id number]

## 2015-03-30 ENCOUNTER — Encounter (HOSPITAL_COMMUNITY): Payer: Self-pay | Admitting: Internal Medicine

## 2015-03-30 LAB — GLUCOSE, CAPILLARY
GLUCOSE-CAPILLARY: 154 mg/dL — AB (ref 65–99)
GLUCOSE-CAPILLARY: 138 mg/dL — AB (ref 65–99)
Glucose-Capillary: 91 mg/dL (ref 65–99)
Glucose-Capillary: 123 mg/dL — ABNORMAL HIGH (ref 65–99)
Glucose-Capillary: 119 mg/dL — ABNORMAL HIGH (ref 65–99)

## 2015-03-30 NOTE — Care Management Note (Signed)
Case Management Note  Patient Details  Name: [name] MRN: [id number] Date of Birth: 16-Dec-1929  Subjective/Objective:                    Action/Plan:   Expected Discharge Date:                  Expected Discharge Plan:  Home/Self Care  In-House Referral:  NA  Discharge planning Services  CM Consult  Post Acute Care Choice:  NA Choice offered to:  NA  DME Arranged:    DME Agency:     HH Arranged:    HH Agency:     Status of Service:  Completed, signed off  Medicare Important Message Given:  Yes-second notification given Date Medicare IM Given:    Medicare IM give by:    Date Additional Medicare IM Given:    Additional Medicare Important Message give by:     If discussed at Long Length of Stay Meetings, dates discussed:    Additional Comments: Anticipate discharge within 24 hours. Pt diarrhea has decreased and kidney function improved. Pt also tolerating regular diet. No Cm needs noted. Arlyss Queen Barrett, RN 03/30/2015, 11:17 AM

## 2015-03-30 NOTE — Care Management Important Message (Signed)
Important Message  Patient Details  Name: [name] MRN: [id number] Date of Birth: [birth year]   Medicare Important Message Given:  Yes-second notification given    Cheryl Flash, RN 03/30/2015, 11:16 AM

## 2015-03-30 NOTE — Progress Notes (Signed)
Subjective: The patient is alert and oriented this morning. He did have colonoscopy yesterday. Which he tolerated well. Nurse states that he has not had as many episodes of diarrhea. He does have history of hypertension and diabetes which is been good control. He did have also renal failure initially which has improved with hydration  Objective: Vital signs in last 24 hours: Temp:  [97.6 F (36.4 C)-98.3 F (36.8 C)] 98.3 F (36.8 C) (08/26 0516) Pulse Rate:  [70-86] 71 (08/26 0516) Resp:  [13-20] 20 (08/26 0516) BP: (119-176)/(58-113) 131/58 mmHg (08/26 0516) SpO2:  [97 %-100 %] 97 % (08/26 0516) Weight change:  Last BM Date: 03/29/15  Intake/Output from previous day: 08/25 0701 - 08/26 0700 In: 600 [I.V.:600] Out: -  Intake/Output this shift:    Physical Exam: Gen. appearance the patient is alert and oriented  HEENT negative  Neck supple no JVD or thyroid abnormalities  Heart regular rhythm no murmurs  Lungs clear to P&A  Abdomen no palpable organs or masses no tenderness  Skin no abnormality  Extremities free of edema  No results for input(s): WBC, HGB, HCT, PLT in the last 72 hours. BMET  Recent Labs  03/28/15 0542 03/29/15 0434  NA 140 140  K 3.4* 3.6  CL 112* 109  CO2 23 23  GLUCOSE 76 71  BUN 10 7  CREATININE 0.98 0.94  CALCIUM 7.6* 7.8*    Studies/Results: No results found.  Medications:  . acidophilus  2 capsule Oral Daily  . aspirin EC  81 mg Oral Daily  . budesonide  9 mg Oral Daily  . enoxaparin (LOVENOX) injection  40 mg Subcutaneous Q24H  . gabapentin  100 mg Oral TID  . insulin aspart  0-5 Units Subcutaneous QHS  . insulin aspart  0-9 Units Subcutaneous TID WC  . insulin detemir  60 Units Subcutaneous q morning - 10a  . niacin  500 mg Oral QHS  . omega-3 acid ethyl esters  1 g Oral BID  . pantoprazole  40 mg Oral QAC breakfast  . polycarbophil  1,250 mg Oral Daily  . sodium chloride  3 mL Intravenous Q12H    .  sodium  bicarbonate infusion 1/4 NS 1000 mL 50 mL/hr at 03/29/15 1125     Assessment/Plan: 1. Acute renal failure-improved with hydration  2. Diabetes mellitus plan to continue current basal insulin and sliding scale insulin  3 chronic diarrhea-multiple diverticula the and erosions in sigmoid colon-plan to continue hydration will discuss with gastroenterologist::   LOS: 5 days   Shihab States G 03/30/2015, 6:41 AM

## 2015-03-30 NOTE — Progress Notes (Signed)
    Subjective: Clinically improved, no abdominal pain. Per patient and family no further loose stools since yesterday evening. Feels better.  Objective: Vital signs in last 24 hours: Temp:  [97.6 F (36.4 C)-98.3 F (36.8 C)] 98.3 F (36.8 C) (08/26 0516) Pulse Rate:  [70-86] 71 (08/26 0516) Resp:  [13-20] 20 (08/26 0516) BP: (119-176)/(58-113) 131/58 mmHg (08/26 0516) SpO2:  [97 %-100 %] 97 % (08/26 0516) Last BM Date: 03/29/15 General:   Alert and oriented, pleasant, sitting on the edge of bed with family in the room. Head:  Normocephalic and atraumatic. Heart:  S1, S2 present, no murmurs noted.  Lungs: Clear to auscultation bilaterally, without wheezing, rales, or rhonchi.  Abdomen:  Bowel sounds present, soft, non-tender, non-distended. No HSM or hernias noted. No rebound or guarding. No masses appreciated  Extremities:  Without clubbing or edema. Neurologic:  Alert and  oriented x4;  grossly normal neurologically. Skin:  Warm and dry, intact without significant lesions.  Cervical Nodes:  No significant cervical adenopathy. Psych:  Normal mood and affect.  Intake/Output from previous day: 08/25 0701 - 08/26 0700 In: 600 [I.V.:600] Out: -  Intake/Output this shift: Total I/O In: 240 [P.O.:240] Out: -   Lab Results: No results for input(s): WBC, HGB, HCT, PLT in the last 72 hours. BMET  Recent Labs  03/28/15 0542 03/29/15 0434  NA 140 140  K 3.4* 3.6  CL 112* 109  CO2 23 23  GLUCOSE 76 71  BUN 10 7  CREATININE 0.98 0.94  CALCIUM 7.6* 7.8*   LFT No results for input(s): PROT, ALBUMIN, AST, ALT, ALKPHOS, BILITOT, BILIDIR, IBILI in the last 72 hours. PT/INR No results for input(s): LABPROT, INR in the last 72 hours. Hepatitis Panel No results for input(s): HEPBSAG, HCVAB, HEPAIGM, HEPBIGM in the last 72 hours.   Studies/Results: No results found.  Assessment:  81 year old male admitted with acute renal failure in the setting of chronic, non-bloody  diarrhea, with negative Cdiff and O&P. Stool culture preliminary negative. No CT findings of colitis. Leukocytosis on admission noted but now improved significantly. Last colonoscopy in 2009 as noted above.   Colonoscopy yesterday notes inflammatory changes in proximal rectum, colon, and terminal ileum suspicious for Crohn's, biopsies taken and still pending. Was started on budesonide 9 mg daily for IBD treatment, low residue diet, avoid NSAIDs.  Clinically improved today with no further diarrhea episodes since last night. Continued no abdominal pain, no blood noted in stool. Kidney function has recovered with BUN/Cr at baseline.     Plan: 1. Continue to monitor for worsening symptoms and/or GI bleeding 2. Continue budesonide 3. Supportive measures 4. Await pathology results and final stool culture results 5. Will need GI follow-up after discharge    Wynne Dust, AGNP-C Adult & Gerontological Nurse Practitioner [facility]    LOS: 5 days    03/30/2015, 9:51 AM

## 2015-03-30 NOTE — Progress Notes (Addendum)
Patient suitable for discharge from a GI standpoint with Entocort. Message sent to [facility] staff to schedule 2 week hospital follow-up. We will contact with pathology results as well as address them in his follow-up visit.  Attending note:  I have just looked in on patient. He looks really good. Denies abdominal pain. No stool since colonoscopy. Tolerating diet. Ambulating in the hallway. He is dischargeble from a GI standpoint.  Close follow-up to be arranged next week. Home on Entocort for at least the next 7 days

## 2015-03-31 ENCOUNTER — Inpatient Hospital Stay (HOSPITAL_COMMUNITY): Payer: Medicare Other

## 2015-03-31 LAB — CBC WITH DIFFERENTIAL/PLATELET
BASOS ABS: 0 10*3/uL (ref 0.0–0.1)
Basophils Relative: 0 % (ref 0–1)
EOS ABS: 0.2 10*3/uL (ref 0.0–0.7)
Eosinophils Relative: 2 % (ref 0–5)
HCT: 36 % — ABNORMAL LOW (ref 39.0–52.0)
Hemoglobin: 12.1 g/dL — ABNORMAL LOW (ref 13.0–17.0)
LYMPHS ABS: 2 10*3/uL (ref 0.7–4.0)
Lymphocytes Relative: 17 % (ref 12–46)
MCH: 28.5 pg (ref 26.0–34.0)
MCHC: 33.6 g/dL (ref 30.0–36.0)
MCV: 84.7 fL (ref 78.0–100.0)
MONO ABS: 1.1 10*3/uL — AB (ref 0.1–1.0)
Monocytes Relative: 9 % (ref 3–12)
NEUTROS ABS: 8.7 10*3/uL — AB (ref 1.7–7.7)
Neutrophils Relative %: 72 % (ref 43–77)
PLATELETS: 158 10*3/uL (ref 150–400)
RBC: 4.25 MIL/uL (ref 4.22–5.81)
RDW: 14.5 % (ref 11.5–15.5)
WBC: 12 10*3/uL — AB (ref 4.0–10.5)

## 2015-03-31 LAB — COMPREHENSIVE METABOLIC PANEL
ALBUMIN: 3.6 g/dL (ref 3.5–5.0)
ALT: 15 U/L — ABNORMAL LOW (ref 17–63)
ANION GAP: 9 (ref 5–15)
AST: 20 U/L (ref 15–41)
Alkaline Phosphatase: 66 U/L (ref 38–126)
BUN: 12 mg/dL (ref 6–20)
CALCIUM: 8.9 mg/dL (ref 8.9–10.3)
CO2: 27 mmol/L (ref 22–32)
Chloride: 103 mmol/L (ref 101–111)
Creatinine, Ser: 1.05 mg/dL (ref 0.61–1.24)
GFR calc non Af Amer: >60 mL/min (ref >60)
GLUCOSE: 188 mg/dL — AB (ref 65–99)
POTASSIUM: 4 mmol/L (ref 3.5–5.1)
SODIUM: 139 mmol/L (ref 135–145)
TOTAL PROTEIN: 7 g/dL (ref 6.5–8.1)
Total Bilirubin: 0.4 mg/dL (ref 0.3–1.2)

## 2015-03-31 LAB — STOOL CULTURE

## 2015-03-31 LAB — GLUCOSE, CAPILLARY
GLUCOSE-CAPILLARY: 123 mg/dL — AB (ref 65–99)
GLUCOSE-CAPILLARY: 205 mg/dL — AB (ref 65–99)
Glucose-Capillary: 215 mg/dL — ABNORMAL HIGH (ref 65–99)
Glucose-Capillary: 280 mg/dL — ABNORMAL HIGH (ref 65–99)

## 2015-03-31 LAB — LIPASE, BLOOD: Lipase: 63 U/L — ABNORMAL HIGH (ref 22–51)

## 2015-03-31 MED ORDER — IOHEXOL 300 MG/ML  SOLN
50.0000 mL | Freq: Once | INTRAMUSCULAR | Status: AC | PRN
  Administered 2015-03-31: 50 mL via ORAL

## 2015-03-31 MED ORDER — IOHEXOL 350 MG/ML SOLN
100.0000 mL | Freq: Once | INTRAVENOUS | Status: AC | PRN
  Administered 2015-03-31: 100 mL via INTRAVENOUS

## 2015-03-31 MED ORDER — HYDROMORPHONE HCL 1 MG/ML IJ SOLN
1.0000 mg | INTRAMUSCULAR | Status: DC | PRN
  Administered 2015-03-31: 1 mg via INTRAVENOUS
  Filled 2015-03-31: qty 1

## 2015-03-31 MED ORDER — DICYCLOMINE HCL 10 MG PO CAPS
10.0000 mg | ORAL_CAPSULE | Freq: Three times a day (TID) | ORAL | Status: DC
  Administered 2015-03-31 – 2015-04-02 (×11): 10 mg via ORAL
  Filled 2015-03-31 (×11): qty 1

## 2015-03-31 MED ORDER — SODIUM CHLORIDE 0.9 % IJ SOLN
INTRAMUSCULAR | Status: AC
  Filled 2015-03-31: qty 30

## 2015-03-31 MED ORDER — SIMETHICONE 80 MG PO CHEW
80.0000 mg | CHEWABLE_TABLET | Freq: Four times a day (QID) | ORAL | Status: DC | PRN
  Administered 2015-03-31: 80 mg via ORAL
  Filled 2015-03-31: qty 1

## 2015-03-31 NOTE — Progress Notes (Signed)
Subjective: The patient is alert and oriented this morning. He does have abdominal pain which developed in early morning hours. He did have his colonoscopy yesterday with biopsies. He has had no further episodes of diarrhea. His hypertension and diabetes have been in good control his renal failure has improved  Objective: Vital signs in last 24 hours: Temp:  [97.5 F (36.4 C)-98.3 F (36.8 C)] 98.1 F (36.7 C) (08/27 0605) Pulse Rate:  [71-86] 86 (08/27 0605) Resp:  [18] 18 (08/27 0605) BP: (131-153)/(61-76) 138/76 mmHg (08/27 0605) SpO2:  [98 %-99 %] 98 % (08/27 0605) Weight change:  Last BM Date: 03/29/15  Intake/Output from previous day: 08/26 0701 - 08/27 0700 In: 960 [P.O.:960] Out: -  Intake/Output this shift:    Physical Exam: Gen. appearance the patient is uncomfortable complaining of abdominal pain  HEENT negative  Neck supple no JVD or thyroid abnormalities  Heart regular rhythm no murmurs  Lungs clear to P&A  Abdomen slightly tender to palpation  Extremities free of edema  No results for input(s): WBC, HGB, HCT, PLT in the last 72 hours. BMET  Recent Labs  03/29/15 0434  NA 140  K 3.6  CL 109  CO2 23  GLUCOSE 71  BUN 7  CREATININE 0.94  CALCIUM 7.8*    Studies/Results: No results found.  Medications:  . acidophilus  2 capsule Oral Daily  . aspirin EC  81 mg Oral Daily  . budesonide  9 mg Oral Daily  . dicyclomine  10 mg Oral TID AC & HS  . enoxaparin (LOVENOX) injection  40 mg Subcutaneous Q24H  . gabapentin  100 mg Oral TID  . insulin aspart  0-5 Units Subcutaneous QHS  . insulin aspart  0-9 Units Subcutaneous TID WC  . insulin detemir  60 Units Subcutaneous q morning - 10a  . niacin  500 mg Oral QHS  . omega-3 acid ethyl esters  1 Kramer Oral BID  . pantoprazole  40 mg Oral QAC breakfast  . polycarbophil  1,250 mg Oral Daily  . sodium chloride  3 mL Intravenous Q12H    .  sodium bicarbonate infusion 1/4 NS 1000 mL 50 mL/hr at 03/29/15  1125     Assessment/Plan: 1. Acute renal failure improved with hydration  2. Diabetes mellitus under control to continue current basal insulin sliding scale insulin  3. Chronic diarrhea with multiple diverticula A and erosions possible underlying Crohn's disease-patient has abdominal pain today will delay discharge   LOS: 6 days   [name] 03/31/2015, 8:18 AM

## 2015-03-31 NOTE — Progress Notes (Signed)
Lipase now mildly elevated; lfts ok; Mild persisting leukocytosis with a left shift. Renal function normal.  Etiology of abdominal pain not adequately explained based on work-up thus far.  Will proceed with a contrast pancreatic protocol CT to further assess sx-to get a better look at pancreas, blood vessels, etc.

## 2015-03-31 NOTE — Progress Notes (Signed)
Hospital discharge postponed. The patient doing well until about 0400 this morning when he started having recurrence of crampy upper abdominal pain radiating into his back; initially rated 7 out of 10. States this is the same pain that caused him to come to the hospital. No stool, whatsoever, since colonoscopy. Tolerating post colonoscopy diet until now.  Hasn't had any nausea vomiting or fever.  Pain lessened with 1 mg of Dilaudid.  Marland Kitchen He states that historically discomfort he has experienced with occasional constipation in the distant past differs from current symptoms.  Stool studies coming back negative. Biopsy remains pending.    Vital signs in last 24 hours: Temp:  [97.5 F (36.4 C)-98.3 F (36.8 C)] 98.1 F (36.7 C) (08/27 0605) Pulse Rate:  [71-86] 86 (08/27 0605) Resp:  [18] 18 (08/27 0605) BP: (131-153)/(61-76) 138/76 mmHg (08/27 0605) SpO2:  [98 %-99 %] 98 % (08/27 0605) Last BM Date: 03/29/15 General:   Alert,   pleasant and cooperative in NAD; found eating a hamburger. Accompanied by spouse. Abdomen:  Full. Good bowel sounds. Abdomen is soft and nontender to palpation without appreciable mass or organomegaly  Extremities:  Without clubbing or edema.    Intake/Output from previous day: 08/26 0701 - 08/27 0700 In: 960 [P.O.:960] Out: -  Intake/Output this shift:    Lab Results: No results for input(s): WBC, HGB, HCT, PLT in the last 72 hours. BMET  Recent Labs  03/29/15 0434  NA 140  K 3.6  CL 109  CO2 23  GLUCOSE 71  BUN 7  CREATININE 0.94  CALCIUM 7.8*    Impression:  Chronic diarrhea with dehydration and secondary acute renal failure has resolved. Colonoscopy findings as outlined. Budesonide initiated for presumptive diagnosis of inflammatory bowel disease. Now with recurrence of abdominal pain for which he was admitted. Admission labs reassessed. CT re-reviewed.  I do not see any complications from the colonoscopy. No cautery utilized. His abdominal  examination is benign this morning. No significant vascular plaques on noncontrast CT to suggest the possibility of ischemia. Ongoing discomfort would be atypical for gallbladder disease but not out of the question.  Recommendations:  We'll back off to a clear liquid diet. Agree with Bentyl and Simethicon for now.  Will stop FiberCon.  Repeat labs. Follow up on pathology the first week

## 2015-04-01 DIAGNOSIS — R748 Abnormal levels of other serum enzymes: Secondary | ICD-10-CM

## 2015-04-01 LAB — CREATININE, SERUM
CREATININE: 0.9 mg/dL (ref 0.61–1.24)
GFR calc non Af Amer: >60 mL/min (ref >60)

## 2015-04-01 LAB — GLUCOSE, CAPILLARY
Glucose-Capillary: 119 mg/dL — ABNORMAL HIGH (ref 65–99)
Glucose-Capillary: 162 mg/dL — ABNORMAL HIGH (ref 65–99)
Glucose-Capillary: 198 mg/dL — ABNORMAL HIGH (ref 65–99)
Glucose-Capillary: 196 mg/dL — ABNORMAL HIGH (ref 65–99)

## 2015-04-01 NOTE — Progress Notes (Addendum)
Patient states abdominal pain has completely resolved.  CTA abdomen reviewed with Dr. Andria Meuse yesterday evening. Mesenteric vasculature, pancreas okay. Abnormal terminal ileum and right colon consistent with ileo-colonoscopy findings and clinical suspicion for inflammatory bowel disease. Peripheral atherosclerotic lesions noted.  Small bowel diverticulosis. Fatty appearing liver    Vital signs in last 24 hours: Temp:  [98.6 F (37 C)-99.2 F (37.3 C)] 98.6 F (37 C) (08/28 0641) Pulse Rate:  [73-78] 77 (08/28 0641) Resp:  [18-20] 20 (08/28 0641) BP: (130-149)/(57-72) 130/57 mmHg (08/28 0641) SpO2:  [95 %-98 %] 95 % (08/28 0641) Last BM Date: 03/29/15 General:   Alert,  Well-developed, well-nourished, pleasant and cooperative in NAD.  Accompanied by wife. Abdomen: Nondistended.  Normal bowel sounds, without guarding, and without rebound.  No mass or organomegaly. Extremities:  Without clubbing or edema.    Intake/Output from previous day: 08/27 0701 - 08/28 0700 In: 960 [P.O.:960] Out: -  Intake/Output this shift:    Lab Results:  Recent Labs  03/31/15 1335  WBC 12.0*  HGB 12.1*  HCT 36.0*  PLT 158   BMET  Recent Labs  03/31/15 1335 04/01/15 0512  NA 139  --   K 4.0  --   CL 103  --   CO2 27  --   GLUCOSE 188*  --   BUN 12  --   CREATININE 1.05 0.90  CALCIUM 8.9  --    LFT  Recent Labs  03/31/15 1335  PROT 7.0  ALBUMIN 3.6  AST 20  ALT 15*  ALKPHOS 66  BILITOT 0.4   Studies/Results: Ct Angio Abd/pel W/ And/or W/o  03/31/2015   CLINICAL DATA:  Patient was admitted 6 days ago with abdominal pain and diarrhea. Lipase is now elevated. Leukocytosis and left shift. No explanation for abdominal pain.  EXAM: CTA ABDOMEN AND PELVIS wITHOUT AND WITH CONTRAST  TECHNIQUE: Multidetector CT imaging of the abdomen and pelvis was performed using the standard protocol during bolus administration of intravenous contrast. Multiplanar reconstructed images and MIPs were  obtained and reviewed to evaluate the vascular anatomy.  CONTRAST:  50mL OMNIPAQUE IOHEXOL 300 MG/ML SOLN, OMNIPAQUE IOHEXOL 350 MG/ML SOLN  COMPARISON:  03/25/2015  FINDINGS: Persistent peribronchial infiltration in the left lung base. Small esophageal hiatal hernia.  Mild to moderate atherosclerotic changes in the abdominal aorta and branch vessels with scattered aortic calcification and mild mural thrombus. Atherosclerotic changes are demonstrated at the origins of the superior mesenteric vein and bilateral single renal arteries as well as at the aortic bifurcation. No critical occlusion is demonstrated. No evidence of dissection. No aneurysm. The aorta, celiac axis, superior mesenteric artery, single bilateral renal arteries, inferior mesenteric artery, and bilateral iliac, external iliac, internal iliac, and common femoral arteries are patent. The proximal left superficial femoral artery demonstrates atherosclerotic change with apparent focal stenosis or occlusion. High-grade stenosis at the origin of the right superficial femoral artery. Nephrograms are symmetrical.  Mild diffuse fatty infiltration of the liver. No focal liver lesions appreciated. Portal veins, splenic veins, and superior mesenteric vein appear patent. No hydronephrosis in either kidney. Gallbladder again is mildly distended but no inflammatory changes or stones identified. Pancreas, spleen, adrenal glands, kidneys, inferior vena cava, and retroperitoneal lymph nodes are unremarkable. Stomach, small bowel, and colon are not abnormally distended. Stool fills the colon. Contrast dural flows through to the colon without evidence of bowel obstruction. There is mild wall thickening demonstrated in the terminal ileum which may represent inflammatory change. Small scattered diverticula in  the terminal ileum. No infiltration in the right lower quadrant fat. No free air or free fluid in the abdomen.  Pelvis: Prostate gland is enlarged with  calcification. Bladder wall is not thickened. Diverticulosis of the sigmoid colon. No evidence of diverticulitis. Appendix is normal. Bladder wall is not thickened. No free or loculated pelvic fluid collections. Degenerative changes in the spine. No destructive bone lesions.  Review of the MIP images confirms the above findings.  IMPRESSION: Mild to moderate atherosclerotic changes throughout the abdominal aorta and branch vessels. Abdominal aorta and branch vessels appear patent. Nephrograms are symmetrical. There is evidence of significant stenosis of the right proximal superficial femoral artery and probable focal occlusion of the left proximal superficial femoral artery. Mesenteric artery and veins are patent. Suggestion of thickening of the wall of the terminal ileum possibly indicating enteritis or inflammatory bowel disease. Small diverticula in the terminal ileum.   Electronically Signed   By: Burman Nieves M.D.   On: 03/31/2015 20:08    Impression:  Episode of abdominal pain-nonspecific, appears to have resolved. Mild leukocytosis and slightly elevated lipase all so nonspecific-I doubt acute pancreatitis at this point in time.  CT and a colonoscopic findings consistent with inflammatory bowel disease  Recommendations:  Full liquid diet today. Continue budesonide. Follow-up on pathology.  Discussed with Dr. Dickey Gave.

## 2015-04-01 NOTE — Progress Notes (Signed)
Subjective: The patient is alert and oriented this morning. He states that he did not have any abdominal pain last evening he has not had a bowel movement yet since his colonoscopy  Objective: Vital signs in last 24 hours: Temp:  [98.6 F (37 C)-99.2 F (37.3 C)] 98.6 F (37 C) (08/28 0641) Pulse Rate:  [73-78] 77 (08/28 0641) Resp:  [18-20] 20 (08/28 0641) BP: (130-149)/(57-72) 130/57 mmHg (08/28 0641) SpO2:  [95 %-98 %] 95 % (08/28 0641) Weight change:  Last BM Date: 03/29/15  Intake/Output from previous day: 08/27 0701 - 08/28 0700 In: 960 [P.O.:960] Out: -  Intake/Output this shift:    Physical Exam: Gen. appearance the patient is fairly comfortable alert and oriented  HEENT negative  Neck supple no JVD or thyroid abnormalities  Heart regular rhythm no murmurs  Lungs clear to P&A  Abdomen no palpable organs or masses no tenderness  Extremities free of edema   Recent Labs  03/31/15 1335  WBC 12.0*  HGB 12.1*  HCT 36.0*  PLT 158   BMET  Recent Labs  03/31/15 1335 04/01/15 0512  NA 139  --   K 4.0  --   CL 103  --   CO2 27  --   GLUCOSE 188*  --   BUN 12  --   CREATININE 1.05 0.90  CALCIUM 8.9  --     Studies/Results: Ct Angio Abd/pel W/ And/or W/o  03/31/2015   CLINICAL DATA:  Patient was admitted 6 days ago with abdominal pain and diarrhea. Lipase is now elevated. Leukocytosis and left shift. No explanation for abdominal pain.  EXAM: CTA ABDOMEN AND PELVIS wITHOUT AND WITH CONTRAST  TECHNIQUE: Multidetector CT imaging of the abdomen and pelvis was performed using the standard protocol during bolus administration of intravenous contrast. Multiplanar reconstructed images and MIPs were obtained and reviewed to evaluate the vascular anatomy.  CONTRAST:  50mL OMNIPAQUE IOHEXOL 300 MG/ML SOLN, OMNIPAQUE IOHEXOL 350 MG/ML SOLN  COMPARISON:  03/25/2015  FINDINGS: Persistent peribronchial infiltration in the left lung base. Small esophageal hiatal  hernia.  Mild to moderate atherosclerotic changes in the abdominal aorta and branch vessels with scattered aortic calcification and mild mural thrombus. Atherosclerotic changes are demonstrated at the origins of the superior mesenteric vein and bilateral single renal arteries as well as at the aortic bifurcation. No critical occlusion is demonstrated. No evidence of dissection. No aneurysm. The aorta, celiac axis, superior mesenteric artery, single bilateral renal arteries, inferior mesenteric artery, and bilateral iliac, external iliac, internal iliac, and common femoral arteries are patent. The proximal left superficial femoral artery demonstrates atherosclerotic change with apparent focal stenosis or occlusion. High-grade stenosis at the origin of the right superficial femoral artery. Nephrograms are symmetrical.  Mild diffuse fatty infiltration of the liver. No focal liver lesions appreciated. Portal veins, splenic veins, and superior mesenteric vein appear patent. No hydronephrosis in either kidney. Gallbladder again is mildly distended but no inflammatory changes or stones identified. Pancreas, spleen, adrenal glands, kidneys, inferior vena cava, and retroperitoneal lymph nodes are unremarkable. Stomach, small bowel, and colon are not abnormally distended. Stool fills the colon. Contrast dural flows through to the colon without evidence of bowel obstruction. There is mild wall thickening demonstrated in the terminal ileum which may represent inflammatory change. Small scattered diverticula in the terminal ileum. No infiltration in the right lower quadrant fat. No free air or free fluid in the abdomen.  Pelvis: Prostate gland is enlarged with calcification. Bladder wall is not thickened.  Diverticulosis of the sigmoid colon. No evidence of diverticulitis. Appendix is normal. Bladder wall is not thickened. No free or loculated pelvic fluid collections. Degenerative changes in the spine. No destructive bone  lesions.  Review of the MIP images confirms the above findings.  IMPRESSION: Mild to moderate atherosclerotic changes throughout the abdominal aorta and branch vessels. Abdominal aorta and branch vessels appear patent. Nephrograms are symmetrical. There is evidence of significant stenosis of the right proximal superficial femoral artery and probable focal occlusion of the left proximal superficial femoral artery. Mesenteric artery and veins are patent. Suggestion of thickening of the wall of the terminal ileum possibly indicating enteritis or inflammatory bowel disease. Small diverticula in the terminal ileum.   Electronically Signed   By: Burman Nieves M.D.   On: 03/31/2015 20:08    Medications:  . acidophilus  2 capsule Oral Daily  . aspirin EC  81 mg Oral Daily  . budesonide  9 mg Oral Daily  . dicyclomine  10 mg Oral TID AC & HS  . enoxaparin (LOVENOX) injection  40 mg Subcutaneous Q24H  . gabapentin  100 mg Oral TID  . insulin aspart  0-5 Units Subcutaneous QHS  . insulin aspart  0-9 Units Subcutaneous TID WC  . insulin detemir  60 Units Subcutaneous q morning - 10a  . niacin  500 mg Oral QHS  . omega-3 acid ethyl esters  1 g Oral BID  . pantoprazole  40 mg Oral QAC breakfast  . sodium chloride  3 mL Intravenous Q12H        Assessment/Plan: 1. Acute renal failure improved with hydration  2. Diabetes mellitus under control to continue current basal insulin and sliding scale insulin  3. Chronic diarrhea with multiple diverticula the and erosions possible underlying Crohn's disease   LOS: 7 days   Ethelwyn Gilbertson G 04/01/2015, 7:03 AM

## 2015-04-02 ENCOUNTER — Encounter: Payer: Self-pay | Admitting: Internal Medicine

## 2015-04-02 DIAGNOSIS — K529 Noninfective gastroenteritis and colitis, unspecified: Secondary | ICD-10-CM | POA: Insufficient documentation

## 2015-04-02 DIAGNOSIS — K6389 Other specified diseases of intestine: Secondary | ICD-10-CM

## 2015-04-02 LAB — GLUCOSE, CAPILLARY
GLUCOSE-CAPILLARY: 232 mg/dL — AB (ref 65–99)
Glucose-Capillary: 113 mg/dL — ABNORMAL HIGH (ref 65–99)
Glucose-Capillary: 248 mg/dL — ABNORMAL HIGH (ref 65–99)

## 2015-04-02 MED ORDER — PANTOPRAZOLE SODIUM 40 MG PO TBEC: 40.0000 mg | DELAYED_RELEASE_TABLET | Freq: Every day | ORAL | Status: DC

## 2015-04-02 MED ORDER — DICYCLOMINE HCL 10 MG PO CAPS: 10.0000 mg | ORAL_CAPSULE | Freq: Three times a day (TID) | ORAL | Status: DC

## 2015-04-02 NOTE — Progress Notes (Signed)
APPOINTMENT MADE °

## 2015-04-02 NOTE — Progress Notes (Signed)
Inpatient Diabetes Program Recommendations  AACE/ADA: New Consensus Statement on Inpatient Glycemic Control (2013)  Target Ranges:  Prepandial:   less than 140 mg/dL      Peak postprandial:   less than 180 mg/dL (1-2 hours)      Critically ill patients:  140 - 180 mg/dL   Results for [name], [name] (MRN [id number]) as of 04/02/2015 08:00  Ref. Range 04/01/2015 07:42 04/01/2015 11:05 04/01/2015 16:32 04/01/2015 21:41 04/02/2015 07:27  Glucose-Capillary Latest Ref Range: 65-99 mg/dL 295 (H) 284 (H) 132 (H) 196 (H) 113 (H)    Current orders for Inpatient glycemic control: Novolog 0-9 units TID with meals, Novolog 0-5 units HS, Levemir 60 units QAM  Inpatient Diabetes Program Recommendations Insulin - Meal Coverage: Please consider ordering Novolog 3 units TID with meals for meal coverage (in addition to Novolog correction).  Thanks, Orlando Penner, RN, MSN, CCRN, CDE Diabetes Coordinator Inpatient Diabetes Program [phone] (Team Pager from 8am to 5pm) [phone] ([facility] office) [phone] [facility] office) [phone] [facility] office)

## 2015-04-02 NOTE — Care Management Important Message (Signed)
Important Message  Patient Details  Name: [name] MRN: [id number] Date of Birth: 10-27-1929   Medicare Important Message Given:  Yes-third notification given    Cheryl Flash, RN 04/02/2015, 2:05 PM

## 2015-04-02 NOTE — Progress Notes (Addendum)
Subjective: No diarrhea. Abdominal pain resolved. Wants to go home. Tolerating diet. No N/V.   Objective: Vital signs in last 24 hours: Temp:  [98.4 F (36.9 C)-98.7 F (37.1 C)] 98.4 F (36.9 C) (08/29 0618) Pulse Rate:  [72-87] 72 (08/29 0618) Resp:  [20] 20 (08/29 0618) BP: (137-146)/(61-72) 146/72 mmHg (08/29 0618) SpO2:  [96 %-99 %] 96 % (08/29 0618) Last BM Date: 03/29/15 General:   Alert and oriented, pleasant Head:  Normocephalic and atraumatic. Abdomen:  Bowel sounds present, soft, non-tender, non-distended. Limited exam with patient sitting on edge of bed eating breakfast Extremities:  Without  edema. Neurologic:  Alert and  oriented x4;  grossly normal neurologically. Psych:  Alert and cooperative. Normal mood and affect.  Intake/Output from previous day: 08/28 0701 - 08/29 0700 In: 960 [P.O.:960] Out: -  Intake/Output this shift:    Lab Results:  Recent Labs  03/31/15 1335  WBC 12.0*  HGB 12.1*  HCT 36.0*  PLT 158   BMET  Recent Labs  03/31/15 1335 04/01/15 0512  NA 139  --   K 4.0  --   CL 103  --   CO2 27  --   GLUCOSE 188*  --   BUN 12  --   CREATININE 1.05 0.90  CALCIUM 8.9  --    LFT  Recent Labs  03/31/15 1335  PROT 7.0  ALBUMIN 3.6  AST 20  ALT 15*  ALKPHOS 66  BILITOT 0.4     Studies/Results: Ct Angio Abd/pel W/ And/or W/o  03/31/2015   CLINICAL DATA:  Patient was admitted 6 days ago with abdominal pain and diarrhea. Lipase is now elevated. Leukocytosis and left shift. No explanation for abdominal pain.  EXAM: CTA ABDOMEN AND PELVIS wITHOUT AND WITH CONTRAST  TECHNIQUE: Multidetector CT imaging of the abdomen and pelvis was performed using the standard protocol during bolus administration of intravenous contrast. Multiplanar reconstructed images and MIPs were obtained and reviewed to evaluate the vascular anatomy.  CONTRAST:  50mL OMNIPAQUE IOHEXOL 300 MG/ML SOLN, OMNIPAQUE IOHEXOL 350 MG/ML SOLN  COMPARISON:   03/25/2015  FINDINGS: Persistent peribronchial infiltration in the left lung base. Small esophageal hiatal hernia.  Mild to moderate atherosclerotic changes in the abdominal aorta and branch vessels with scattered aortic calcification and mild mural thrombus. Atherosclerotic changes are demonstrated at the origins of the superior mesenteric vein and bilateral single renal arteries as well as at the aortic bifurcation. No critical occlusion is demonstrated. No evidence of dissection. No aneurysm. The aorta, celiac axis, superior mesenteric artery, single bilateral renal arteries, inferior mesenteric artery, and bilateral iliac, external iliac, internal iliac, and common femoral arteries are patent. The proximal left superficial femoral artery demonstrates atherosclerotic change with apparent focal stenosis or occlusion. High-grade stenosis at the origin of the right superficial femoral artery. Nephrograms are symmetrical.  Mild diffuse fatty infiltration of the liver. No focal liver lesions appreciated. Portal veins, splenic veins, and superior mesenteric vein appear patent. No hydronephrosis in either kidney. Gallbladder again is mildly distended but no inflammatory changes or stones identified. Pancreas, spleen, adrenal glands, kidneys, inferior vena cava, and retroperitoneal lymph nodes are unremarkable. Stomach, small bowel, and colon are not abnormally distended. Stool fills the colon. Contrast dural flows through to the colon without evidence of bowel obstruction. There is mild wall thickening demonstrated in the terminal ileum which may represent inflammatory change. Small scattered diverticula in the terminal ileum. No infiltration in the right lower quadrant fat. No free air  or free fluid in the abdomen.  Pelvis: Prostate gland is enlarged with calcification. Bladder wall is not thickened. Diverticulosis of the sigmoid colon. No evidence of diverticulitis. Appendix is normal. Bladder wall is not thickened.  No free or loculated pelvic fluid collections. Degenerative changes in the spine. No destructive bone lesions.  Review of the MIP images confirms the above findings.  IMPRESSION: Mild to moderate atherosclerotic changes throughout the abdominal aorta and branch vessels. Abdominal aorta and branch vessels appear patent. Nephrograms are symmetrical. There is evidence of significant stenosis of the right proximal superficial femoral artery and probable focal occlusion of the left proximal superficial femoral artery. Mesenteric artery and veins are patent. Suggestion of thickening of the wall of the terminal ileum possibly indicating enteritis or inflammatory bowel disease. Small diverticula in the terminal ileum.   Electronically Signed   By: Burman Nieves M.D.   On: 03/31/2015 20:08    Assessment: 81 year old male admitted with diarrhea, with CT and colonoscopic findings consistent with IBD. Final pathology pending. Clinically improved without any further abdominal pain; no diarrhea noted. Tolerating diet. Appropriate for discharge home with close outpatient follow-up.   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 This is good news for the patient. I recommend Entocort be continued at a dose of 9 mg daily for the next 4 weeks and then we will taper over the subsequent 2 months.

## 2015-04-02 NOTE — Care Management Note (Signed)
Case Management Note  Patient Details  Name: [name] MRN: [id number] Date of Birth: 1930-02-17  Subjective/Objective:                    Action/Plan:   Expected Discharge Date:                  Expected Discharge Plan:  Home/Self Care  In-House Referral:  NA  Discharge planning Services  CM Consult  Post Acute Care Choice:  NA Choice offered to:  NA  DME Arranged:    DME Agency:     HH Arranged:    HH Agency:     Status of Service:  Completed, signed off  Medicare Important Message Given:  Yes-third notification given Date Medicare IM Given:    Medicare IM give by:    Date Additional Medicare IM Given:    Additional Medicare Important Message give by:     If discussed at Long Length of Stay Meetings, dates discussed:    Additional Comments: Pt discharging home today. No CM needs noted. Arlyss Queen [place], RN 04/02/2015, 2:05 PM

## 2015-04-02 NOTE — Discharge Summary (Cosign Needed)
Physician Discharge Summary  [name] ZOX:[id number] DOB: [birth year] DOA: 03/25/2015  PCP: Alice Reichert, MD  Admit date: 03/25/2015 Discharge date: 04/02/2015     Discharge Diagnoses:  1. Acute renal failure-dehydration 2. Diabetes mellitus insulin-dependent 3. Hypertension essential 4. Chronic diarrhea possible Crohn's disease 5. Metabolic acidosis 6. Hypokalemia 7.  8.  9.   Discharge Condition: Stable Disposition: Home  Diet recommendation: 2000-calorie ADA diet  Filed Weights   03/25/15 1902  Weight: 81.647 kg (180 lb)    History of present illness:  The patient was admitted through the emergency department with a history of diarrhea which is been persistent for several weeks. He has a history of diabetes is insulin-dependent. It was noted his creatinine was over 4 on admission  Hospital Course:  The patient was started on intravenous fluids on admission and admitted to MedSurg floor. Examination on admission general appearance well-developed patient, HEENT negative, heart regular rhythm no murmurs, lungs clear to P&A, abdomen no palpable organs or masses no organomegaly, skin dry and also loss of tissue turgor. The patient was thought to have acute kidney injury with markedly elevated creatinine over 4 he was seen in consultation by nephrology who recommended continued hydration. The patient's diabetes was managed with continued daily Levemir insulin and daily Humalog insulin and with reference to this he remained fairly stable. His renal insufficiency improved and is creatinine returned to more normal range. The medications listed below for continued. The patient subsequently developed multiple daily diarrheal stools he was seen in consultation by gastroenterology service who recommended colonoscopy. This was performed successfully erosions and diverticula were noted. Biopsies were performed and cultures taken. Following colonoscopy the patient had no more diarrhea but  did develop abdominal pain which was was midabdomen. This responded to conservative treatment. A CT of the abdomen was ordered. This did not show any definite cause for the abdominal pain which subsided. The patient's diet was advanced he became asymptomatic and followed as outpatient Discharge Instructions The patient is to continue medications listed below prescriptions given for Protonix 40 mg daily Bentyl 10 mg 3 times a day acidophilus 2 capsules daily budesonide 9 mg daily. The patient will schedule appointment with gastroenterologist in 2 weeks and primary care care physician in one week    Medication List    STOP taking these medications        ranitidine 150 MG capsule  Commonly known as:  ZANTAC      TAKE these medications        aspirin EC 81 MG tablet  Take 81 mg by mouth daily.     dicyclomine 10 MG capsule  Commonly known as:  BENTYL  Take 1 capsule (10 mg total) by mouth 4 (four) times daily -  before meals and at bedtime.     fish oil-omega-3 fatty acids 1000 MG capsule  Take 1 g by mouth 2 (two) times daily.     gabapentin 100 MG capsule  Commonly known as:  NEURONTIN  Take 1 capsule (100 mg total) by mouth 3 (three) times daily.     HYDROcodone-acetaminophen 5-325 MG per tablet  Commonly known as:  NORCO/VICODIN  Take 1 tablet by mouth every 4 (four) hours as needed for moderate pain.     ibuprofen 800 MG tablet  Commonly known as:  ADVIL,MOTRIN  Take 1 tablet (800 mg total) by mouth every 8 (eight) hours as needed.     insulin detemir 100 UNIT/ML injection  Commonly known as:  LEVEMIR  Inject 60 Units into the skin every morning.     insulin lispro 100 UNIT/ML injection  Commonly known as:  HUMALOG  Inject 1-8 Units into the skin 3 (three) times daily before meals.     lisinopril 40 MG tablet  Commonly known as:  PRINIVIL,ZESTRIL  Take 40 mg by mouth daily.     metFORMIN 1000 MG tablet  Commonly known as:  GLUCOPHAGE  Take 1,000 mg by mouth 2 (two)  times daily with a meal.     multivitamin with minerals Tabs tablet  Take 1 tablet by mouth daily.     NIASPAN 500 MG CR tablet  Generic drug:  niacin  Take 500 mg by mouth at bedtime.     ONGLYZA 5 MG Tabs tablet  Generic drug:  saxagliptin HCl  Take 5 mg by mouth daily.     pantoprazole 40 MG tablet  Commonly known as:  PROTONIX  Take 1 tablet (40 mg total) by mouth daily.     simvastatin 80 MG tablet  Commonly known as:  ZOCOR  Take 80 mg by mouth at bedtime.       No Known Allergies  The results of significant diagnostics from this hospitalization (including imaging, microbiology, ancillary and laboratory) are listed below for reference.    Significant Diagnostic Studies: Ct Abdomen Pelvis Wo Contrast  03/25/2015   CLINICAL DATA:  Low mid back pain radiating to both flanks. Diarrhea for 4 months intermittently. Umbilical pain, right lower quadrant pain, left lower quadrant pain. Nausea.  EXAM: CT ABDOMEN AND PELVIS WITHOUT CONTRAST  TECHNIQUE: Multidetector CT imaging of the abdomen and pelvis was performed following the standard protocol without IV contrast.  COMPARISON:  None.  FINDINGS: Focal peribronchial infiltration in the left lung base could represent early bronchiolitis. Coronary artery calcifications.  Calcified granuloma in the liver. Gallbladder is mildly distended but no stones or inflammatory infiltration are identified. No bile duct dilatation. Unenhanced appearance of the spleen, pancreas, adrenal glands, kidneys, inferior vena cava, and retroperitoneal lymph nodes is unremarkable. Calcification of abdominal aorta without aneurysm. Stomach, small bowel, and colon are not abnormally distended. No free air or free fluid in the abdomen. Abdominal wall musculature appears intact.  Pelvis: Appendix is normal. Prostate gland is mildly enlarged with calcifications. Bladder wall is not thickened. Diverticula in the sigmoid colon without inflammatory change. No free or  loculated pelvic fluid collections. No pelvic lymphadenopathy. Degenerative changes in the spine. No destructive bone lesions appreciated.  IMPRESSION: Focal peribronchial infiltration in the left lung base suggesting bronchiolitis. Mild nonspecific gallbladder distention without inflammatory change or stone identified. Prostate gland is enlarged.   Electronically Signed   By: Burman Nieves M.D.   On: 03/25/2015 21:40   Ct Angio Abd/pel W/ And/or W/o  03/31/2015   CLINICAL DATA:  Patient was admitted 6 days ago with abdominal pain and diarrhea. Lipase is now elevated. Leukocytosis and left shift. No explanation for abdominal pain.  EXAM: CTA ABDOMEN AND PELVIS wITHOUT AND WITH CONTRAST  TECHNIQUE: Multidetector CT imaging of the abdomen and pelvis was performed using the standard protocol during bolus administration of intravenous contrast. Multiplanar reconstructed images and MIPs were obtained and reviewed to evaluate the vascular anatomy.  CONTRAST:  50mL OMNIPAQUE IOHEXOL 300 MG/ML SOLN, OMNIPAQUE IOHEXOL 350 MG/ML SOLN  COMPARISON:  03/25/2015  FINDINGS: Persistent peribronchial infiltration in the left lung base. Small esophageal hiatal hernia.  Mild to moderate atherosclerotic changes in the abdominal aorta and branch vessels with scattered  aortic calcification and mild mural thrombus. Atherosclerotic changes are demonstrated at the origins of the superior mesenteric vein and bilateral single renal arteries as well as at the aortic bifurcation. No critical occlusion is demonstrated. No evidence of dissection. No aneurysm. The aorta, celiac axis, superior mesenteric artery, single bilateral renal arteries, inferior mesenteric artery, and bilateral iliac, external iliac, internal iliac, and common femoral arteries are patent. The proximal left superficial femoral artery demonstrates atherosclerotic change with apparent focal stenosis or occlusion. High-grade stenosis at the origin of the right  superficial femoral artery. Nephrograms are symmetrical.  Mild diffuse fatty infiltration of the liver. No focal liver lesions appreciated. Portal veins, splenic veins, and superior mesenteric vein appear patent. No hydronephrosis in either kidney. Gallbladder again is mildly distended but no inflammatory changes or stones identified. Pancreas, spleen, adrenal glands, kidneys, inferior vena cava, and retroperitoneal lymph nodes are unremarkable. Stomach, small bowel, and colon are not abnormally distended. Stool fills the colon. Contrast dural flows through to the colon without evidence of bowel obstruction. There is mild wall thickening demonstrated in the terminal ileum which may represent inflammatory change. Small scattered diverticula in the terminal ileum. No infiltration in the right lower quadrant fat. No free air or free fluid in the abdomen.  Pelvis: Prostate gland is enlarged with calcification. Bladder wall is not thickened. Diverticulosis of the sigmoid colon. No evidence of diverticulitis. Appendix is normal. Bladder wall is not thickened. No free or loculated pelvic fluid collections. Degenerative changes in the spine. No destructive bone lesions.  Review of the MIP images confirms the above findings.  IMPRESSION: Mild to moderate atherosclerotic changes throughout the abdominal aorta and branch vessels. Abdominal aorta and branch vessels appear patent. Nephrograms are symmetrical. There is evidence of significant stenosis of the right proximal superficial femoral artery and probable focal occlusion of the left proximal superficial femoral artery. Mesenteric artery and veins are patent. Suggestion of thickening of the wall of the terminal ileum possibly indicating enteritis or inflammatory bowel disease. Small diverticula in the terminal ileum.   Electronically Signed   By: Burman Nieves M.D.   On: 03/31/2015 20:08    Microbiology: Recent Results (from the past 240 hour(s))  C difficile quick  scan w PCR reflex     Status: None   Collection Time: 03/25/15  8:18 PM  Result Value Ref Range Status   C Diff antigen NEGATIVE NEGATIVE Final   C Diff toxin NEGATIVE NEGATIVE Final   C Diff interpretation Negative for toxigenic C. difficile  Final  Ova and parasite examination     Status: None   Collection Time: 03/26/15  7:00 AM  Result Value Ref Range Status   Specimen Description STOOL  Final   Special Requests NONE  Final   Ova and parasites   Final    NO OVA OR PARASITES SEEN Performed at [facility]    Report Status 03/27/2015 FINAL  Final  Stool culture     Status: None   Collection Time: 03/27/15 12:30 PM  Result Value Ref Range Status   Specimen Description STOOL  Final   Special Requests NONE  Final   Culture   Final    NO SALMONELLA, SHIGELLA, CAMPYLOBACTER, YERSINIA, OR E.COLI 0157:H7 ISOLATED Performed at [facility]    Report Status 03/31/2015 FINAL  Final     Labs: Basic Metabolic Panel:  Recent Labs Lab 03/27/15 0545 03/28/15 0542 03/29/15 0434 03/31/15 1335 04/01/15 0512  NA 138 140 140 139  --  K 3.5 3.4* 3.6 4.0  --   CL 111 112* 109 103  --   CO2 19* 23 23 27   --   GLUCOSE 90 76 71 188*  --   BUN 17 10 7 12   --   CREATININE 1.32* 0.98 0.94 1.05 0.90  CALCIUM 7.6* 7.6* 7.8* 8.9  --    Liver Function Tests:  Recent Labs Lab 03/31/15 1335  AST 20  ALT 15*  ALKPHOS 66  BILITOT 0.4  PROT 7.0  ALBUMIN 3.6    Recent Labs Lab 03/31/15 1335  LIPASE 63*   No results for input(s): AMMONIA in the last 168 hours. CBC:  Recent Labs Lab 03/31/15 1335  WBC 12.0*  NEUTROABS 8.7*  HGB 12.1*  HCT 36.0*  MCV 84.7  PLT 158   Cardiac Enzymes: No results for input(s): CKTOTAL, CKMB, CKMBINDEX, TROPONINI in the last 168 hours. BNP: BNP (last 3 results) No results for input(s): BNP in the last 8760 hours.  ProBNP (last 3 results) No results for input(s): PROBNP in the last 8760 hours.  CBG:  Recent Labs Lab  04/01/15 1632 04/01/15 2141 04/02/15 0727 04/02/15 1117 04/02/15 1623  GLUCAP 198* 196* 113* 232* 248*    Active Problems:   Acute renal failure   Diabetes   Chronic diarrhea of unknown origin   Intractable diarrhea   Leukocytosis   Mucosal abnormality of colon   Diverticulosis of colon with hemorrhage   IBD (inflammatory bowel disease)   Time coordinating discharge: 45 minutes  Signed:  Butch Penny, MD 04/02/2015, 4:43 PM

## 2015-04-23 ENCOUNTER — Encounter: Payer: Self-pay | Admitting: Nurse Practitioner

## 2015-04-23 ENCOUNTER — Ambulatory Visit (INDEPENDENT_AMBULATORY_CARE_PROVIDER_SITE_OTHER): Payer: Medicare Other | Admitting: Nurse Practitioner

## 2015-04-23 VITALS — BP 160/89 | HR 85 | Temp 97.2°F | Ht 70.0 in | Wt 182.8 lb

## 2015-04-23 DIAGNOSIS — K5282 Eosinophilic colitis: Secondary | ICD-10-CM | POA: Diagnosis not present

## 2015-04-23 NOTE — Progress Notes (Signed)
Referring Provider: Butch Penny, MD Primary Care Physician:  Alice Reichert, MD Primary GI:  Dr. Jena Gauss  Chief Complaint  Patient presents with  . OTHER    Hospital Follow up/ doing well    HPI:   81 year old male presents for follow-up of hospitalization. He was initially admitted 03/25/2015 for several weeks of nonbloody diarrhea. Colonoscopy completed 03/29/2015 found inflammatory changes in the proximal rectum, colon, and terminal ileum suspicious for inflammatory bowel disease likely exacerbated by concrement and NSAID exposure. Recommended begin budesonide 9 mg daily. Pathology results found all suspicious area biopsies with benign colonic mucosa with increased eosinophils compatible with eosinophilic colitis. Per result note on pathology report the results were communicated to the patient while in hospital and recommended 9 mg but desonide for one month and in a taper thereafter.  Today he states he was under the impression he was oriented take budesonide 9 mg daily for 2 weeks. He stopped taking it after 2 weeks which is approximately 1 week prior to today. He states his bowel movements are resolved, no more diarrhea. His diarrhea stopped just after hospital discharge. Has a bowel movement 1-2 times a day which is baseline for him. His stools are formed, soft [place] 4 with no straining. Denies hematochezia and melena. Denies abdominal pain, N/V. Currently has a cold but is otherwsie feeling well. Denies chest pain, dizziness, lightheadedness, syncope, near syncope. Denies any other upper or lower GI symptoms.   Past Medical History  Diagnosis Date  . Diabetes mellitus   . Hypertension   . Hypercholesterolemia   . GERD (gastroesophageal reflux disease)   . Arthritis     Past Surgical History  Procedure Laterality Date  . Tonsillectomy    . Knee arthroscopy Left   . Shoulder arthroscopy with open rotator cuff repair Left 10/15/2012    Procedure: SHOULDER ARTHROSCOPY WITH  OPEN ROTATOR CUFF REPAIR;  Surgeon: Vickki Hearing, MD;  Location: [facility];  Service: Orthopedics;  Laterality: Left;  open at 0912; with bicep tendon debridement;   . Shoulder acromioplasty Left 10/15/2012    Procedure: SHOULDER ACROMIOPLASTY;  Surgeon: Vickki Hearing, MD;  Location: [facility];  Service: Orthopedics;  Laterality: Left;  . Resection distal clavical Left 10/15/2012    Procedure: RESECTION DISTAL CLAVICAL;  Surgeon: Vickki Hearing, MD;  Location: [facility];  Service: Orthopedics;  Laterality: Left;  . Cataract extraction w/phaco Left 03/07/2014    Procedure: CATARACT EXTRACTION PHACO AND INTRAOCULAR LENS PLACEMENT LEFT EYE CDE=7.11;  Surgeon: Loraine Leriche T. Nile Riggs, MD;  Location: [facility];  Service: Ophthalmology;  Laterality: Left;  . Cataract extraction w/phaco Right 03/14/2014    Procedure: CATARACT EXTRACTION PHACO AND INTRAOCULAR LENS PLACEMENT RIGHT EYE CDE=10.62;  Surgeon: Loraine Leriche T. Nile Riggs, MD;  Location: [facility];  Service: Ophthalmology;  Laterality: Right;  . Colonoscopy  2009    Dr. Darrick Penna: pancolonic diverticulosis, internal hemorrhoids  . Colonoscopy N/A 03/29/2015    Procedure: COLONOSCOPY;  Surgeon: Corbin Ade, MD;  Location: [facility];  Service: Endoscopy;  Laterality: N/A;    Current Outpatient Prescriptions  Medication Sig Dispense Refill  . aspirin EC 81 MG tablet Take 81 mg by mouth daily.    . fish oil-omega-3 fatty acids 1000 MG capsule Take 1 g by mouth 2 (two) times daily.     Marland Kitchen gabapentin (NEURONTIN) 100 MG capsule Take 1 capsule (100 mg total) by mouth 3 (three) times daily. 90 capsule 5  . insulin detemir (LEVEMIR) 100 UNIT/ML injection  Inject 65 Units into the skin every morning.     . insulin lispro (HUMALOG) 100 UNIT/ML injection Inject 1-8 Units into the skin once.     Marland Kitchen lisinopril (PRINIVIL,ZESTRIL) 40 MG tablet Take 40 mg by mouth daily.      . metFORMIN (GLUCOPHAGE) 1000 MG tablet Take 1,000 mg by mouth 2 (two) times daily with a meal.      .  Multiple Vitamin (MULTIVITAMIN WITH MINERALS) TABS tablet Take 1 tablet by mouth daily.    Marland Kitchen NIASPAN 500 MG CR tablet Take 500 mg by mouth at bedtime.     . ONGLYZA 5 MG TABS tablet Take 5 mg by mouth daily.     . pantoprazole (PROTONIX) 40 MG tablet Take 1 tablet (40 mg total) by mouth daily. 30 tablet 2  . simvastatin (ZOCOR) 80 MG tablet Take 80 mg by mouth at bedtime.      . dicyclomine (BENTYL) 10 MG capsule Take 1 capsule (10 mg total) by mouth 4 (four) times daily -  before meals and at bedtime. (Patient not taking: Reported on 04/23/2015) 30 capsule 1  . HYDROcodone-acetaminophen (NORCO/VICODIN) 5-325 MG per tablet Take 1 tablet by mouth every 4 (four) hours as needed for moderate pain. (Patient not taking: Reported on 04/23/2015) 60 tablet 0  . ibuprofen (ADVIL,MOTRIN) 800 MG tablet Take 1 tablet (800 mg total) by mouth every 8 (eight) hours as needed. (Patient not taking: Reported on 04/23/2015) 90 tablet 0   No current facility-administered medications for this visit.    Allergies as of 04/23/2015  . (No Known Allergies)    Family History  Problem Relation Age of Onset  . Arthritis    . Cancer    . Diabetes    . Colon cancer Neg Hx     Social History   Social History  . Marital Status: Married    Spouse Name: N/A  . Number of Children: N/A  . Years of Education: N/A   Social History Main Topics  . Smoking status: Former Smoker -- 3.00 packs/day for 30 years    Types: Cigarettes    Quit date: 10/11/1993  . Smokeless tobacco: None     Comment: Quit approx 20 years ago  . Alcohol Use: No  . Drug Use: No  . Sexual Activity: Yes    Birth Control/ Protection: None   Other Topics Concern  . None   Social History Narrative    Review of Systems: General: Negative for anorexia, weight loss, fever, chills. ENT: Negative for hoarseness, difficulty swallowing. CV: Negative for chest pain, angina, palpitations, peripheral edema.  Respiratory: Negative for dyspnea at  rest, cough, sputum, wheezing.  GI: See history of present illness.  Endo: Negative for unusual weight change.  Heme: Negative for bruising or bleeding.   Physical Exam: BP 160/89 mmHg  Pulse 85  Temp(Src) 97.2 F (36.2 C) (Oral)  Ht  (1.778 m)  Wt 182 lb 12.8 oz (82.918 kg)  BMI 26.23 kg/m2 General:   Alert and oriented. Pleasant and cooperative. Well-nourished and well-developed.  Head:  Normocephalic and atraumatic. Eyes:  Without icterus, sclera clear and conjunctiva pink.  Ears:  Hard of hearing. Cardiovascular:  S1, S2 present without murmurs appreciated. Normal pulses noted. Extremities without clubbing or edema. Respiratory:  Clear to auscultation bilaterally. No wheezes, rales, or rhonchi. No distress.  Gastrointestinal:  +BS, soft, non-tender and non-distended. No HSM noted. No guarding or rebound. No masses appreciated.  Rectal:  Deferred  Skin:  Intact without significant lesions or rashes. Neurologic:  Alert and oriented x4;  grossly normal neurologically. Psych:  Alert and cooperative. Normal mood and affect.    04/23/2015 8:51 AM

## 2015-04-23 NOTE — Assessment & Plan Note (Signed)
Patient admitted to the hospital recently with profuse nonbloody diarrhea. Colonoscopy performed as an inpatient found inflammatory changes, extensive diverticulosis, otherwise normal rectal and colonic mucosa. 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 Patient was placed on Entocort with an intended or week treatment course and taper after then. However, there must a minimal miscommunication or misunderstanding as the patient only took budesonide for 2 weeks. He has not had any in one week. He states he is doing very well, diarrhea has resolved. Only complaint is he is caught a cold. Otherwise he feels great. At this point we'll have him return for follow-up in 3 months with instructions to call if any changes in his bowels before then.

## 2015-04-23 NOTE — Progress Notes (Signed)
cc'ed to pcp °

## 2015-04-23 NOTE — Patient Instructions (Signed)
1. Return for follow-up in 3 months.  2. Call if you have any recurrent symptoms or any other GI symptoms.

## 2015-05-17 ENCOUNTER — Ambulatory Visit (INDEPENDENT_AMBULATORY_CARE_PROVIDER_SITE_OTHER): Payer: Medicare Other | Admitting: Otolaryngology

## 2015-05-17 DIAGNOSIS — H903 Sensorineural hearing loss, bilateral: Secondary | ICD-10-CM

## 2015-06-07 ENCOUNTER — Ambulatory Visit: Payer: Medicare Other | Admitting: Orthopedic Surgery

## 2015-06-14 ENCOUNTER — Ambulatory Visit (INDEPENDENT_AMBULATORY_CARE_PROVIDER_SITE_OTHER): Payer: Medicare Other | Admitting: Otolaryngology

## 2015-06-14 DIAGNOSIS — H903 Sensorineural hearing loss, bilateral: Secondary | ICD-10-CM | POA: Diagnosis not present

## 2015-06-14 DIAGNOSIS — H6983 Other specified disorders of Eustachian tube, bilateral: Secondary | ICD-10-CM | POA: Diagnosis not present

## 2015-06-19 ENCOUNTER — Ambulatory Visit (INDEPENDENT_AMBULATORY_CARE_PROVIDER_SITE_OTHER): Payer: Medicare Other | Admitting: Orthopedic Surgery

## 2015-06-19 VITALS — BP 150/88 | Ht 70.0 in | Wt 186.0 lb

## 2015-06-19 DIAGNOSIS — M25512 Pain in left shoulder: Secondary | ICD-10-CM | POA: Diagnosis not present

## 2015-06-19 DIAGNOSIS — M7542 Impingement syndrome of left shoulder: Secondary | ICD-10-CM

## 2015-06-19 MED ORDER — IBUPROFEN 800 MG PO TABS: 800.0000 mg | ORAL_TABLET | Freq: Three times a day (TID) | ORAL | Status: DC

## 2015-06-19 NOTE — Patient Instructions (Signed)
USE HEAT AND BENGAY IBUPROFEN SENT TO PHARMACY  Joint Injection Care After Refer to this sheet in the next few days. These instructions provide you with information on caring for yourself after you have had a joint injection. Your caregiver also may give you more specific instructions. Your treatment has been planned according to current medical practices, but problems sometimes occur. Call your caregiver if you have any problems or questions after your procedure. After any type of joint injection, it is not uncommon to experience:  Soreness, swelling, or bruising around the injection site.  Mild numbness, tingling, or weakness around the injection site caused by the numbing medicine used before or with the injection. It also is possible to experience the following effects associated with the specific agent after injection:  Iodine-based contrast agents:  Allergic reaction (itching, hives, widespread redness, and swelling beyond the injection site).  Corticosteroids (These effects are rare.):  Allergic reaction.  Increased blood sugar levels (If you have diabetes and you notice that your blood sugar levels have increased, notify your caregiver).  Increased blood pressure levels.  Mood swings.  Hyaluronic acid in the use of viscosupplementation.  Temporary heat or redness.  Temporary rash and itching.  Increased fluid accumulation in the injected joint. These effects all should resolve within a day after your procedure.  HOME CARE INSTRUCTIONS  Limit yourself to light activity the day of your procedure. Avoid lifting heavy objects, bending, stooping, or twisting.  Take prescription or over-the-counter pain medication as directed by your caregiver.  You may apply ice to your injection site to reduce pain and swelling the day of your procedure. Ice may be applied 03-04 times:  Put ice in a plastic bag.  Place a towel between your skin and the bag.  Leave the ice on for no  longer than 15-20 minutes each time. SEEK IMMEDIATE MEDICAL CARE IF:   Pain and swelling get worse rather than better or extend beyond the injection site.  Numbness does not go away.  Blood or fluid continues to leak from the injection site.  You have chest pain.  You have swelling of your face or tongue.  You have trouble breathing or you become dizzy.  You develop a fever, chills, or severe tenderness at the injection site that last longer than 1 day. MAKE SURE YOU:  Understand these instructions.  Watch your condition.  Get help right away if you are not doing well or if you get worse. Document Released: 04/03/2011 Document Revised: 10/13/2011 Document Reviewed: 04/03/2011 [facility] Patient Information 2015 [employer], [place]. This information is not intended to replace advice given to you by your health care provider. Make sure you discuss any questions you have with your health care provider.

## 2015-06-19 NOTE — Progress Notes (Signed)
Chief Complaint  Patient presents with  . Follow-up    bilateral shoulder pain    The patient really complains of left shoulder pain after picking up a lawnmower deck about a week ago. Complains of left shoulder pain dull ache over the left trapezius mild constant and worsens with certain positions of the shoulder review of systems denies numbness tingling fever chills or rash  Past Medical History  Diagnosis Date  . Diabetes mellitus   . Hypertension   . Hypercholesterolemia   . GERD (gastroesophageal reflux disease)   . Arthritis   . Eosinophilic colitis     BP 150/88 mmHg  Ht 5\' 10"  (1.778 m)  Wt 186 lb (84.369 kg)  BMI 26.69 kg/m2 He is awake alert and oriented 3 mood and affect are normal his appearance shows no gross deformities he is ambulatory without assistive device  He has tenderness over his left trapezius muscle and pain with rotation of the cervical spine shoulder motion is normal his shoulder is stable his strength in the rotator cuff is grade 5 skin and the area is normal previous skin incision for open rotator cuff repair normal as normal lymph nodes in the area and the sensation in the arm is normal and is normal and vascular exam of the hand shows good pulse and perfusion and capillary refill  Impression trapezius muscle strain mild impingement note positive impingement sign  Recommend continue heat and BenGay over the area of tenderness at ibuprofen 800 mg 3 times a day and left shoulder injection    Procedure note the subacromial injection shoulder left   Verbal consent was obtained to inject the  Left   Shoulder  Timeout was completed to confirm the injection site is a subacromial space of the  left  shoulder  Medication used Depo-Medrol 40 mg and lidocaine 1% 3 cc  Anesthesia was provided by ethyl chloride  The injection was performed in the left  posterior subacromial space. After pinning the skin with alcohol and anesthetized the skin with ethyl  chloride the subacromial space was injected using a 20-gauge needle. There were no complications  Sterile dressing was applied.

## 2015-07-16 ENCOUNTER — Telehealth: Payer: Self-pay | Admitting: Orthopedic Surgery

## 2015-07-16 NOTE — Telephone Encounter (Signed)
Routing to Dr. Harrison to advise 

## 2015-07-16 NOTE — Telephone Encounter (Signed)
Patient's wife (designated contact person) called to relay that the injections, per office visit 06/19/15, helped some for awhile, but now shoulder pain has returned; asking if he may schedule another appointment.  Please advise about any recommendations, of if to schedule another visit.  Ph# [phone].

## 2015-07-17 NOTE — Telephone Encounter (Signed)
Called patient; scheduled appointment accordinlgy.

## 2015-07-17 NOTE — Telephone Encounter (Signed)
Yes but not this week

## 2015-07-17 NOTE — Telephone Encounter (Signed)
plz schedule.

## 2015-07-23 ENCOUNTER — Ambulatory Visit (INDEPENDENT_AMBULATORY_CARE_PROVIDER_SITE_OTHER): Payer: Medicare Other | Admitting: Nurse Practitioner

## 2015-07-23 ENCOUNTER — Encounter: Payer: Self-pay | Admitting: Nurse Practitioner

## 2015-07-23 VITALS — BP 138/76 | HR 84 | Temp 97.0°F | Ht 70.0 in | Wt 191.8 lb

## 2015-07-23 DIAGNOSIS — K5282 Eosinophilic colitis: Secondary | ICD-10-CM

## 2015-07-23 NOTE — Patient Instructions (Signed)
1. Call and let us know if you have any stomach or colon problems, especially worsening diarrhea.. 2. Return for follow-up as needed

## 2015-07-23 NOTE — Assessment & Plan Note (Signed)
Patient continues to do well, no recurrence of diarrhea since his hospitalization. Completed two weeks of Entocort with symptom resolution. At this point we will have him return as needed for any worsening or recurrent symptoms.

## 2015-07-23 NOTE — Progress Notes (Signed)
Referring Provider: Butch Penny, MD Primary Care Physician:  Alice Reichert, MD Primary GI:  Dr. Jena Gauss  Chief Complaint  Patient presents with  . Follow-up    HPI:   81 year old male presents for follow-up on eosinophilic colitis. This was discovered on colonoscopy while admitted as an inpatient due to persistent diarrhea. He was given a four-week course of Entocort however due to miscommunication he only took 2 weeks worth. At last office visit on 04/23/2015, despite only having a limited duration of therapy, he was much improved. Diarrhea had resolved and stated "I feel great."  Today he states he's doing well. No return of diarrhea. Has a bowel movement 1-2 times a day, soft, pass well with no straining. Has persistent left should pain, had a surgical intervention 3 years ago and has seen ortho regarding pain and received steroid/lidocaine shot about 1 months ago. Has a follow-up appointemt with ortho soon. Denies N/V, abdominal pain, hematochezia, melena. Denies fever, chills, diarrhea. Denies chest pain, dyspnea, dizziness, lightheadedness, syncope, near syncope. Denies any other upper or lower GI symptoms.   Past Medical History  Diagnosis Date  . Diabetes mellitus   . Hypertension   . Hypercholesterolemia   . GERD (gastroesophageal reflux disease)   . Arthritis   . Eosinophilic colitis     Past Surgical History  Procedure Laterality Date  . Tonsillectomy    . Knee arthroscopy Left   . Shoulder arthroscopy with open rotator cuff repair Left 10/15/2012    Procedure: SHOULDER ARTHROSCOPY WITH OPEN ROTATOR CUFF REPAIR;  Surgeon: Vickki Hearing, MD;  Location: [facility];  Service: Orthopedics;  Laterality: Left;  open at 0912; with bicep tendon debridement;   . Shoulder acromioplasty Left 10/15/2012    Procedure: SHOULDER ACROMIOPLASTY;  Surgeon: Vickki Hearing, MD;  Location: [facility];  Service: Orthopedics;  Laterality: Left;  . Resection distal clavical Left 10/15/2012     Procedure: RESECTION DISTAL CLAVICAL;  Surgeon: Vickki Hearing, MD;  Location: [facility];  Service: Orthopedics;  Laterality: Left;  . Cataract extraction w/phaco Left 03/07/2014    Procedure: CATARACT EXTRACTION PHACO AND INTRAOCULAR LENS PLACEMENT LEFT EYE CDE=7.11;  Surgeon: Loraine Leriche T. Nile Riggs, MD;  Location: [facility];  Service: Ophthalmology;  Laterality: Left;  . Cataract extraction w/phaco Right 03/14/2014    Procedure: CATARACT EXTRACTION PHACO AND INTRAOCULAR LENS PLACEMENT RIGHT EYE CDE=10.62;  Surgeon: Loraine Leriche T. Nile Riggs, MD;  Location: [facility];  Service: Ophthalmology;  Laterality: Right;  . Colonoscopy  2009    Dr. Darrick Penna: pancolonic diverticulosis, internal hemorrhoids  . Colonoscopy N/A 03/29/2015    RMR: inflammatory changes in the proximal rectum, colon and  terminal ileum suspicious for inflammatory bowel disease. ie. primarily Crohns colitis. Status post biopsy as described. concomitant NSAID exposure could excaerbate pre-existing inflammatory bowel disease but I do not think todays finding are primarly related to recent ibuprofen use.     Current Outpatient Prescriptions  Medication Sig Dispense Refill  . aspirin EC 81 MG tablet Take 81 mg by mouth daily.    . budesonide (ENTOCORT EC) 3 MG 24 hr capsule Take by mouth daily.    . fish oil-omega-3 fatty acids 1000 MG capsule Take 1 g by mouth 2 (two) times daily.     Marland Kitchen gabapentin (NEURONTIN) 100 MG capsule Take 1 capsule (100 mg total) by mouth 3 (three) times daily. 90 capsule 5  . ibuprofen (ADVIL,MOTRIN) 800 MG tablet Take 1 tablet (800 mg total) by mouth 3 (three) times daily.  90 tablet 0  . insulin detemir (LEVEMIR) 100 UNIT/ML injection Inject 65 Units into the skin every morning.     . insulin lispro (HUMALOG) 100 UNIT/ML injection Inject 1-8 Units into the skin once.     Marland Kitchen. lisinopril (PRINIVIL,ZESTRIL) 40 MG tablet Take 40 mg by mouth daily.      . metFORMIN (GLUCOPHAGE) 1000 MG tablet Take 1,000 mg by mouth 2 (two) times daily  with a meal.      . Multiple Vitamin (MULTIVITAMIN WITH MINERALS) TABS tablet Take 1 tablet by mouth daily.    Marland Kitchen. NIASPAN 500 MG CR tablet Take 500 mg by mouth at bedtime.     . ONGLYZA 5 MG TABS tablet Take 5 mg by mouth daily.     . pantoprazole (PROTONIX) 40 MG tablet Take 1 tablet (40 mg total) by mouth daily. 30 tablet 2  . simvastatin (ZOCOR) 80 MG tablet Take 80 mg by mouth at bedtime.       No current facility-administered medications for this visit.    Allergies as of 07/23/2015  . (No Known Allergies)    Family History  Problem Relation Age of Onset  . Arthritis    . Cancer    . Diabetes    . Colon cancer Neg Hx     Social History   Social History  . Marital Status: Married    Spouse Name: N/A  . Number of Children: N/A  . Years of Education: N/A   Social History Main Topics  . Smoking status: Former Smoker -- 3.00 packs/day for 30 years    Types: Cigarettes    Quit date: 10/11/1993  . Smokeless tobacco: Never Used     Comment: Quit approx 20 years ago  . Alcohol Use: No  . Drug Use: No  . Sexual Activity: Yes    Birth Control/ Protection: None   Other Topics Concern  . None   Social History Narrative    Review of Systems: General: Negative for anorexia, weight loss, fever, chills, fatigue, weakness. ENT: Negative for hoarseness, difficulty swallowing. CV: Negative for chest pain, angina, palpitations, peripheral edema.  Respiratory: Negative for dyspnea at rest, cough, sputum, wheezing.  GI: See history of present illness. Endo: Negative for unusual weight change.    Physical Exam: BP 138/76 mmHg  Pulse 84  Temp(Src) 97 F (36.1 C)  Ht 5\' 10"  (1.778 m)  Wt 191 lb 12.8 oz (87 kg)  BMI 27.52 kg/m2 General:   Alert and oriented. Pleasant and cooperative. Well-nourished and well-developed.  Head:  Normocephalic and atraumatic. Ears:  Hard of hearing. Cardiovascular:  S1, S2 present without murmurs appreciated. Extremities without clubbing or  edema. Respiratory:  Clear to auscultation bilaterally. No wheezes, rales, or rhonchi. No distress.  Gastrointestinal:  +BS, soft, non-tender and non-distended. No HSM noted. No guarding or rebound. No masses appreciated.  Rectal:  Deferred  Musculoskalatal:  Perisstnet left shoulder pain. Neurologic:  Alert and oriented x4;  grossly normal neurologically. Psych:  Alert and cooperative. Normal mood and affect.    07/23/2015 9:22 AM

## 2015-07-23 NOTE — Progress Notes (Signed)
CC'ED TO PCP 

## 2015-07-26 ENCOUNTER — Ambulatory Visit (INDEPENDENT_AMBULATORY_CARE_PROVIDER_SITE_OTHER): Payer: Medicare Other

## 2015-07-26 ENCOUNTER — Other Ambulatory Visit: Payer: Self-pay | Admitting: *Deleted

## 2015-07-26 ENCOUNTER — Ambulatory Visit (INDEPENDENT_AMBULATORY_CARE_PROVIDER_SITE_OTHER): Payer: Medicare Other | Admitting: Orthopedic Surgery

## 2015-07-26 VITALS — BP 148/81 | Ht 70.0 in | Wt 191.8 lb

## 2015-07-26 DIAGNOSIS — M47812 Spondylosis without myelopathy or radiculopathy, cervical region: Secondary | ICD-10-CM | POA: Diagnosis not present

## 2015-07-26 DIAGNOSIS — M542 Cervicalgia: Secondary | ICD-10-CM | POA: Diagnosis not present

## 2015-07-26 MED ORDER — DICLOFENAC POTASSIUM 50 MG PO TABS: 50.0000 mg | ORAL_TABLET | Freq: Two times a day (BID) | ORAL | Status: DC

## 2015-07-26 NOTE — Patient Instructions (Signed)
Stop ibuprofen  Start diclofenac 50 mg twice a day continue to use the heating pad  Physical therapy required by her insurance company please call the appropriate number given and follow-up with us in 6 weeks

## 2015-07-26 NOTE — Progress Notes (Signed)
Follow-up visit  Chief complaint left shoulder pain however the patient is really having pain in his neck.  As we noted before he picked up a lawnmower deck back in November and had pain in the left trapezius muscle and increased pain with certain positions but he had an intact rotator cuff we gave him an injection it didn't help  He does have decreased range of motion with extension of the cervical spine with pain with extension which is severe and is unrelieved by ibuprofen or hydrocodone. It appears to be a dull ache.  Review of systems neurologically he denies numbness or tingling in the left upper extremity. Musculoskeletal denies weakness in the left hand. Denies gait disturbance.  Past Medical History  Diagnosis Date  . Diabetes mellitus   . Hypertension   . Hypercholesterolemia   . GERD (gastroesophageal reflux disease)   . Arthritis   . Eosinophilic colitis     BP 148/81 mmHg  Ht 5\' 10"  (1.778 m)  Wt 191 lb 12.8 oz (87 kg)  BMI 27.52 kg/m2 He is otherwise awake alert and oriented 3 mood and affect are normal he is hard of hearing his appearance is well groomed  He has no gait abnormality  The neck area the skin is normal as is the shoulder has normal sensation and reflexes in both upper extremities good pulses and no lymph nodes in either axilla or supraclavicular region. He has good strength in his rotator cuff. Shoulder is stable. Full range of motion in the shoulder.  Cervical spine he has decreased extension and painful extension pain when he turns to the right none to the left  Head tilt test is normal   xrays of the neck severe spondylosis   Plan nsaids tramadol and neck PT   Return in 6 weeks

## 2015-08-03 ENCOUNTER — Ambulatory Visit (HOSPITAL_COMMUNITY): Payer: Medicare Other | Attending: Orthopedic Surgery

## 2015-08-03 DIAGNOSIS — R293 Abnormal posture: Secondary | ICD-10-CM

## 2015-08-03 DIAGNOSIS — M25519 Pain in unspecified shoulder: Secondary | ICD-10-CM

## 2015-08-03 DIAGNOSIS — M4000 Postural kyphosis, site unspecified: Secondary | ICD-10-CM

## 2015-08-03 DIAGNOSIS — M47812 Spondylosis without myelopathy or radiculopathy, cervical region: Secondary | ICD-10-CM

## 2015-08-03 DIAGNOSIS — M542 Cervicalgia: Secondary | ICD-10-CM | POA: Diagnosis present

## 2015-08-03 DIAGNOSIS — R278 Other lack of coordination: Secondary | ICD-10-CM | POA: Diagnosis present

## 2015-08-07 ENCOUNTER — Ambulatory Visit (HOSPITAL_COMMUNITY): Payer: Medicare Other | Attending: Orthopedic Surgery | Admitting: Physical Therapy

## 2015-08-07 DIAGNOSIS — M6281 Muscle weakness (generalized): Secondary | ICD-10-CM | POA: Diagnosis present

## 2015-08-07 DIAGNOSIS — M47812 Spondylosis without myelopathy or radiculopathy, cervical region: Secondary | ICD-10-CM | POA: Diagnosis present

## 2015-08-07 DIAGNOSIS — M4000 Postural kyphosis, site unspecified: Secondary | ICD-10-CM | POA: Diagnosis present

## 2015-08-07 DIAGNOSIS — M25519 Pain in unspecified shoulder: Secondary | ICD-10-CM

## 2015-08-07 DIAGNOSIS — M2560 Stiffness of unspecified joint, not elsewhere classified: Secondary | ICD-10-CM | POA: Diagnosis present

## 2015-08-07 DIAGNOSIS — R293 Abnormal posture: Secondary | ICD-10-CM | POA: Diagnosis present

## 2015-08-07 DIAGNOSIS — M545 Low back pain: Secondary | ICD-10-CM | POA: Diagnosis present

## 2015-08-07 DIAGNOSIS — M542 Cervicalgia: Secondary | ICD-10-CM | POA: Diagnosis present

## 2015-08-07 DIAGNOSIS — R278 Other lack of coordination: Secondary | ICD-10-CM | POA: Diagnosis present

## 2015-08-07 NOTE — Therapy (Signed)
[facility] [address] [place], [place], [address] Phone: [phone]   Fax:  [phone]  Physical Therapy Treatment  Patient Details  Name: [name] MRN: [id number] Date of Birth: 09-Aug-1929 Referring Provider: Arther Abbott  Encounter Date: 08/07/2015      PT End of Session - 08/07/15 0846    Visit Number 2   Number of Visits 18   Date for PT Re-Evaluation 09/04/15   Authorization Type --   Authorization Time Period 08/03/15-10/02/15   PT Start Time 0810  problem with evaluating PT not having completed evaluation in timely fashion; time to ID this and switch patient to PT from PTA    PT Stop Time 0843   PT Time Calculation (min) 33 min   Activity Tolerance Patient tolerated treatment well   Behavior During Therapy [facility] for tasks assessed/performed      Past Medical History  Diagnosis Date  . Diabetes mellitus   . Hypertension   . Hypercholesterolemia   . GERD (gastroesophageal reflux disease)   . Arthritis   . Eosinophilic colitis     Past Surgical History  Procedure Laterality Date  . Tonsillectomy    . Knee arthroscopy Left   . Shoulder arthroscopy with open rotator cuff repair Left 10/15/2012    Procedure: SHOULDER ARTHROSCOPY WITH OPEN ROTATOR CUFF REPAIR;  Surgeon: Carole Civil, MD;  Location: [facility];  Service: Orthopedics;  Laterality: Left;  open at 0912; with bicep tendon debridement;   . Shoulder acromioplasty Left 10/15/2012    Procedure: SHOULDER ACROMIOPLASTY;  Surgeon: Carole Civil, MD;  Location: [facility];  Service: Orthopedics;  Laterality: Left;  . Resection distal clavical Left 10/15/2012    Procedure: RESECTION DISTAL CLAVICAL;  Surgeon: Carole Civil, MD;  Location: [facility];  Service: Orthopedics;  Laterality: Left;  . Cataract extraction w/phaco Left 03/07/2014    Procedure: CATARACT EXTRACTION PHACO AND INTRAOCULAR LENS PLACEMENT LEFT EYE CDE=7.11;  Surgeon: Elta Guadeloupe T. Gershon Crane, MD;   Location: [facility];  Service: Ophthalmology;  Laterality: Left;  . Cataract extraction w/phaco Right 03/14/2014    Procedure: CATARACT EXTRACTION PHACO AND INTRAOCULAR LENS PLACEMENT RIGHT EYE CDE=10.62;  Surgeon: Elta Guadeloupe T. Gershon Crane, MD;  Location: [facility];  Service: Ophthalmology;  Laterality: Right;  . Colonoscopy  2009    Dr. Oneida Alar: pancolonic diverticulosis, internal hemorrhoids  . Colonoscopy N/A 03/29/2015    RMR: inflammatory changes in the proximal rectum, colon and  terminal ileum suspicious for inflammatory bowel disease. ie. primarily Crohns colitis. Status post biopsy as described. concomitant NSAID exposure could excaerbate pre-existing inflammatory bowel disease but I do not think todays finding are primarly related to recent ibuprofen use.     There were no vitals filed for this visit.  Visit Diagnosis:  Cervical spondylosis without myelopathy  Body posture problem  Pain in joint, shoulder region, unspecified laterality  Cervicalgia  Kyphosis (acquired) (postural)      Subjective Assessment - 08/07/15 0812    Subjective Patient arrived on time today having no pain except, he reports, for when he is laying down; wife present throughout session    Currently in Pain? No/denies            [facility] PT Assessment - 08/07/15 0001    Assessment   Medical Diagnosis Cervical Spondylosis   Onset Date/Surgical Date 06/20/15   Hand Dominance Right   Next MD Visit unsure   Prior Therapy After L shoulder surgery   Precautions   Precautions None  Restrictions   Weight Bearing Restrictions No   Balance Screen   Has the patient fallen in the past 6 months No   Has the patient had a decrease in activity level because of a fear of falling?  No   Is the patient reluctant to leave their home because of a fear of falling?  No   Prior Function   Level of Independence Independent   Observation/Other Assessments   Focus on Therapeutic Outcomes (FOTO)  54% (46% impaired)    Sensation    Light Touch Appears Intact   Posture/Postural Control   Posture/Postural Control Postural limitations   Postural Limitations Rounded Shoulders;Increased thoracic kyphosis  Cervical extension is mildly better with combined overhead r   AROM   Cervical Flexion --  1 finger Chin to Sternum   Cervical Extension 25   Cervical - Right Side Bend 25   Cervical - Left Side Bend 17   Cervical - Right Rotation 67   Cervical - Left Rotation 60   Strength   Overall Strength Within functional limits for tasks performed  *see note for details                     [facility] Adult PT Treatment/Exercise - 08/07/15 0001    Neck Exercises: Seated   Neck Retraction 15 reps   Shoulder Flexion 15 reps   Shoulder Flexion Weights (lbs) 0   Shoulder ABduction 15 reps   Shoulder Abduction Weights (lbs) 0   Other Seated Exercise 3D cervical and thoracic excursions 1x15; backwards shoulder rolls 1x15   Other Seated Exercise scapular retractions and shoulder shrugs 1x15   Manual Therapy   Manual Therapy Soft tissue mobilization   Manual therapy comments performed separately from ther ex today    Soft tissue mobilization L cervical extensors and upper trap    Neck Exercises: Stretches   Corner Stretch 3 reps;30 seconds                PT Education - 08/07/15 0846    Education provided No          PT Short Term Goals - 08/03/15 1610    PT SHORT TERM GOAL #1   Title After 2 weeks, pt will demonstrate independence and compliance in an entry level home exercise program.    PT SHORT TERM GOAL #2   Title After 3 weeks, pt will demonstrate 50% improved cervical rotation and sidebending to improve safety while driving.    PT SHORT TERM GOAL #3   Title After 3 weeks, pt will report less than 4/10 pain at night while sleeping to improve overall general health and quality of life.            PT Long Term Goals - 08/03/15 1615    PT LONG TERM GOAL #1   Title After 6 weeks, pt will  demonstrate indep in advanced HEP to further progress in funcitonal mobility, and postural correction.    Status New   PT LONG TERM GOAL #2   Title After 6 weeks, pt will demonstrate less than 2/10 pain at night during sleep and throughout day to demonstrate return to PLOF and improve qulaity of life.    Status New   PT LONG TERM GOAL #3   Title After 6 weeks, pt will demonstrate at pain free cervical rotation of 80 degrees bilaterally to improve safety while driving.    Status New  Plan - 08/07/15 0848    Clinical Impression Statement Focused on functional postural exercises today and finished today's session up with manual to L cervical extensors and upper trap at end of session today. Patient pleasant hroughout sesion however did not experience reduction in pain at end of session. Noted difficulty with isolated cervical motion today likely due to kyphotic posture and impaired ability to efficiently perform 3D excursions with proper form today.    Pt will benefit from skilled therapeutic intervention in order to improve on the following deficits Hypomobility;Decreased strength;Pain;Increased muscle spasms;Improper body mechanics;Impaired flexibility;Postural dysfunction   Rehab Potential Good   PT Frequency 3x / week   PT Duration 6 weeks   PT Treatment/Interventions ADLs/Self Care Home Management;Moist Heat;Traction;Therapeutic exercise;Patient/family education;Manual techniques;Passive range of motion   PT Next Visit Plan Review HEP in full. Thoracic AROM and stretching in 3 planes; continue with atlantoaxial stretches and MET.    PT Home Exercise Plan issued.    Consulted and Agree with Plan of Care Patient        Problem List Patient Active Problem List   Diagnosis Date Noted  . Eosinophilic colitis 07/61/5183  . IBD (inflammatory bowel disease)   . Mucosal abnormality of colon   . Diverticulosis of colon with hemorrhage   . Intractable diarrhea   .  Leukocytosis   . Acute renal failure ([place]) 03/25/2015  . Diabetes ([place]) 03/25/2015  . Chronic diarrhea of unknown origin 03/25/2015  . Rotator cuff tear 01/04/2013  . S/P rotator cuff repair 01/04/2013  . Arthritis, shoulder region 10/15/2012  . Biceps tendonitis on left 10/15/2012  . Adhesive bursitis of left shoulder 09/01/2012  . Partial tear of rotator cuff(726.13) 09/01/2012  . Pain in joint, shoulder region 07/09/2011  . Muscle weakness (generalized) 07/09/2011  . Bursitis of shoulder, left 07/03/2011    Deniece Ree PT, DPT [facility] [address] [place], [place], [address] Phone: [phone]   Fax:  [phone]  Name: [name] MRN: [id number] Date of Birth: 29-Jun-1930

## 2015-08-07 NOTE — Therapy (Signed)
[facility] [address] [place], [place], [address] Phone: [phone]   Fax:  [phone]  Physical Therapy Evaluation  Patient Details  Name: [name] MRN: [id number] Date of Birth: November 09, 1929 Referring Provider: Arther Abbott  Encounter Date: 08/03/2015      PT End of Session - 08/03/15 1520   Visit Number 1  Number of Visits 18  Date for PT Re-Evaluation 09/03/15  Authorization Type Medicare   PT Start Time 1520   PT Stop Time 1606  PT Time Calculation (min) 46 min  Activity Tolerance Patient tolerated treatment well  Behavior During Therapy [facility] for tasks assessed/performed                                        Past Medical History  Diagnosis Date  . Diabetes mellitus   . Hypertension   . Hypercholesterolemia   . GERD (gastroesophageal reflux disease)   . Arthritis   . Eosinophilic colitis     Past Surgical History  Procedure Laterality Date  . Tonsillectomy    . Knee arthroscopy Left   . Shoulder arthroscopy with open rotator cuff repair Left 10/15/2012    Procedure: SHOULDER ARTHROSCOPY WITH OPEN ROTATOR CUFF REPAIR;  Surgeon: Carole Civil, MD;  Location: [facility];  Service: Orthopedics;  Laterality: Left;  open at 0912; with bicep tendon debridement;   . Shoulder acromioplasty Left 10/15/2012    Procedure: SHOULDER ACROMIOPLASTY;  Surgeon: Carole Civil, MD;  Location: [facility];  Service: Orthopedics;  Laterality: Left;  . Resection distal clavical Left 10/15/2012    Procedure: RESECTION DISTAL CLAVICAL;  Surgeon: Carole Civil, MD;  Location: [facility];  Service: Orthopedics;  Laterality: Left;  . Cataract extraction w/phaco Left 03/07/2014    Procedure: CATARACT EXTRACTION PHACO AND INTRAOCULAR LENS PLACEMENT LEFT EYE CDE=7.11;  Surgeon: Elta Guadeloupe T. Gershon Crane, MD;  Location: [facility];  Service: Ophthalmology;  Laterality: Left;  . Cataract extraction w/phaco Right 03/14/2014    Procedure: CATARACT  EXTRACTION PHACO AND INTRAOCULAR LENS PLACEMENT RIGHT EYE CDE=10.62;  Surgeon: Elta Guadeloupe T. Gershon Crane, MD;  Location: [facility];  Service: Ophthalmology;  Laterality: Right;  . Colonoscopy  2009    Dr. Oneida Alar: pancolonic diverticulosis, internal hemorrhoids  . Colonoscopy N/A 03/29/2015    RMR: inflammatory changes in the proximal rectum, colon and  terminal ileum suspicious for inflammatory bowel disease. ie. primarily Crohns colitis. Status post biopsy as described. concomitant NSAID exposure could excaerbate pre-existing inflammatory bowel disease but I do not think todays finding are primarly related to recent ibuprofen use.     There were no vitals filed for this visit.  Visit Diagnosis:  Cervical spondylosis without myelopathy  Body posture problem  Pain in joint, shoulder region, unspecified laterality  Cervicalgia  Kyphosis (acquired) (postural)  Coordination abnormal Subjective Assessment - 08/03/15    Subjective Pt reports pain in his Left shoulder after lifting a heavy load about 1 month back. Pain has remain mostly unchanged since onset, and remains stable throughout the day, except for occasional relief while sitting in his comfy chair, arm supported. Pt says that his head turning has been very limited for a while but is now quite painful with head turns over left shoulder while driving. He denies difficulty donning doffing clothing. Denies trouble with washing hair or face. At night, the pain is exacerbated by lying on the side, so he  has been attempting to remain supine. He has taken some prescritption pain meds intermittently that have helped with the pain (meds are from an old surgery on the L shoulder, he is unable to say what they are.)       Currently in Pain? 8/10, L neck, near the attachment of the levator scap on the medial scapular border  Patient is unable to say whether his pain limits his sitting, standing, or walking.            [facility] PT Assessment - 08/07/15 0001     Assessment   Medical Diagnosis Cervical Spondylosis   Onset Date/Surgical Date 06/20/15   Hand Dominance Right   Next MD Visit unsure   Prior Therapy After L shoulder surgery   Precautions   Precautions None   Restrictions   Weight Bearing Restrictions No   Balance Screen   Has the patient fallen in the past 6 months No   Has the patient had a decrease in activity level because of a fear of falling?  No   Is the patient reluctant to leave their home because of a fear of falling?  No   Prior Function   Level of Independence Independent   Observation/Other Assessments   Focus on Therapeutic Outcomes (FOTO)  54% (46% impaired)    Sensation   Light Touch Appears Intact   Posture/Postural Control   Posture/Postural Control Postural limitations   Postural Limitations Rounded Shoulders;Increased thoracic kyphosis  Cervical extension is mildly better with combined overhead r   AROM   Cervical Flexion --  1 finger Chin to Sternum   Cervical Extension 25   Cervical - Right Side Bend 25   Cervical - Left Side Bend 17   Cervical - Right Rotation 67   Cervical - Left Rotation 60   Strength   Overall Strength Within functional limits for tasks performed  *see note for details             [facility] Adult PT Treatment/Exercise - 08/03/15 1535    Cervical retraction into pillow:  1x15x3sec Stargazer Stretch on towelroll: 5 minutes Atlantoaxial rotational stretch MET: 2x30sec bilat (manual)  Upper Trap Stretch: 2x30sec bilat (manual) Cervical Traction: 3 minutes (manual) Shoulder flexion AROM in supine with PVC: x20 Supine Bridging on Towel Roll: 1x15        PT Education - 08/07/15 0846    Education provided No          PT Short Term Goals - 08/03/15 1610    PT SHORT TERM GOAL #1   Title After 2 weeks, pt will demonstrate independence and compliance in an entry level home exercise program.    PT SHORT TERM GOAL #2   Title After 3 weeks, pt will demonstrate 50% improved  cervical rotation and sidebending to improve safety while driving.    PT SHORT TERM GOAL #3   Title After 3 weeks, pt will report less than 4/10 pain at night while sleeping to improve overall general health and quality of life.            PT Long Term Goals - 08/03/15 1615    PT LONG TERM GOAL #1   Title After 6 weeks, pt will demonstrate indep in advanced HEP to further progress in funcitonal mobility, and postural correction.    Status New   PT LONG TERM GOAL #2   Title After 6 weeks, pt will demonstrate less than 2/10 pain at night during sleep and throughout day  to demonstrate return to PLOF and improve qulaity of life.    Status New   PT LONG TERM GOAL #3   Title After 6 weeks, pt will demonstrate at pain free cervical rotation of 80 degrees bilaterally to improve safety while driving.    Status New         Plan - Aug 13, 2015 1615   Clinical Impression Statement --Pt presenting with limited cervical range of motion in rotation, and side bending with worsening of pain with left sided closing restrictions at the C4/C5 level that cause worsening of pain near the attachment of the levator on the medial scapula. MMT of all UE myotomes reveals 5/5 strength, and additional isolated testing of bilat rotator cuff muscles, serratus anterior, upper trap, middle trap, and posterior trap are all pain-free and 5/5.  Pt demonstrating mild postural thoracic kyphosis, that remains quite rigid with AROM, which when combined with forward head posture, put additional load on cervical spine for gross postural extension. Increased lordosis of cervical spine, limits facet closing available for cervical rotation. Additionally, pt's atlanto axial joint is hypomobile, which also creats and additional rotational load on the already limited lower cervical spine.    Pt will benefit from skilled therapeutic intervention in order to improve on the following deficits  Hypomobility;Decreased strength;Pain;Increased  muscle spasms;Improper body mechanics;Impaired flexibility;Postural dysfunction   Rehab Potential Good   PT Frequency 3x / week   PT Duration 6 weeks    PT Treatment/Interventions ADLs/Self Care Home Management;Moist Heat;Traction;Therapeutic exercise;Patient/family education;Manual techniques;Passive range of motion;   PT Next Visit Plan Review HEP in full. Thoracic AROM and stretching in 3 planes; continue with atlantoaxial stretches and MET.   PT Home Exercise Plan Issued   Consulted and Agree with Plan of Care Patient    G-Codes - 08/13/2015 1637      Functional Assessment Tool Used  FOTO/Clinical Judgment     Functional Limitation  Changing and maintaining body position     Changing and Maintaining Body Position Goal Status (T5176)  CK=At least 40 percent but less than 60 percent impaired, limited or restricted     Changing and Maintaining Body Position Discharge Status (H6073)  CJ= At least 20 percent but less than 40 percent impaired, limited or restricted          Problem List Patient Active Problem List   Diagnosis Date Noted  . Eosinophilic colitis 71/01/2693  . IBD (inflammatory bowel disease)   . Mucosal abnormality of colon   . Diverticulosis of colon with hemorrhage   . Intractable diarrhea   . Leukocytosis   . Acute renal failure ([place]) 03/25/2015  . Diabetes ([place]) 03/25/2015  . Chronic diarrhea of unknown origin 03/25/2015  . Rotator cuff tear 01/04/2013  . S/P rotator cuff repair 01/04/2013  . Arthritis, shoulder region 10/15/2012  . Biceps tendonitis on left 10/15/2012  . Adhesive bursitis of left shoulder 09/01/2012  . Partial tear of rotator cuff(726.13) 09/01/2012  . Pain in joint, shoulder region 07/09/2011  . Muscle weakness (generalized) 07/09/2011  . Bursitis of shoulder, left 07/03/2011    Buccola,Allan C 08/07/2015, 12:52 PM 1:01 PM  Etta Grandchild, PT, DPT [place] License # [id number]       [facility] [address] [place], [place], [address] Phone: [phone]   Fax:  [phone]  Name: Starlin Steib MRN: [id number] Date of Birth: 1930/05/11

## 2015-08-07 NOTE — Therapy (Signed)
[facility] [address] [place], [place], [address] Phone: [phone]   Fax:  [phone]  Physical Therapy Evaluation  Patient Details  Name: [name] MRN: [id number] Date of Birth: November 09, 1929 Referring Provider: Arther Abbott  Encounter Date: 08/03/2015      PT End of Session - 08/03/15 1520   Visit Number 1  Number of Visits 18  Date for PT Re-Evaluation 09/03/15  Authorization Type Medicare   PT Start Time 1520   PT Stop Time 1606  PT Time Calculation (min) 46 min  Activity Tolerance Patient tolerated treatment well  Behavior During Therapy [facility] for tasks assessed/performed                                        Past Medical History  Diagnosis Date  . Diabetes mellitus   . Hypertension   . Hypercholesterolemia   . GERD (gastroesophageal reflux disease)   . Arthritis   . Eosinophilic colitis     Past Surgical History  Procedure Laterality Date  . Tonsillectomy    . Knee arthroscopy Left   . Shoulder arthroscopy with open rotator cuff repair Left 10/15/2012    Procedure: SHOULDER ARTHROSCOPY WITH OPEN ROTATOR CUFF REPAIR;  Surgeon: Carole Civil, MD;  Location: [facility];  Service: Orthopedics;  Laterality: Left;  open at 0912; with bicep tendon debridement;   . Shoulder acromioplasty Left 10/15/2012    Procedure: SHOULDER ACROMIOPLASTY;  Surgeon: Carole Civil, MD;  Location: [facility];  Service: Orthopedics;  Laterality: Left;  . Resection distal clavical Left 10/15/2012    Procedure: RESECTION DISTAL CLAVICAL;  Surgeon: Carole Civil, MD;  Location: [facility];  Service: Orthopedics;  Laterality: Left;  . Cataract extraction w/phaco Left 03/07/2014    Procedure: CATARACT EXTRACTION PHACO AND INTRAOCULAR LENS PLACEMENT LEFT EYE CDE=7.11;  Surgeon: Elta Guadeloupe T. Gershon Crane, MD;  Location: [facility];  Service: Ophthalmology;  Laterality: Left;  . Cataract extraction w/phaco Right 03/14/2014    Procedure: CATARACT  EXTRACTION PHACO AND INTRAOCULAR LENS PLACEMENT RIGHT EYE CDE=10.62;  Surgeon: Elta Guadeloupe T. Gershon Crane, MD;  Location: [facility];  Service: Ophthalmology;  Laterality: Right;  . Colonoscopy  2009    Dr. Oneida Alar: pancolonic diverticulosis, internal hemorrhoids  . Colonoscopy N/A 03/29/2015    RMR: inflammatory changes in the proximal rectum, colon and  terminal ileum suspicious for inflammatory bowel disease. ie. primarily Crohns colitis. Status post biopsy as described. concomitant NSAID exposure could excaerbate pre-existing inflammatory bowel disease but I do not think todays finding are primarly related to recent ibuprofen use.     There were no vitals filed for this visit.  Visit Diagnosis:  Cervical spondylosis without myelopathy  Body posture problem  Pain in joint, shoulder region, unspecified laterality  Cervicalgia  Kyphosis (acquired) (postural)  Coordination abnormal Subjective Assessment - 08/03/15    Subjective Pt reports pain in his Left shoulder after lifting a heavy load about 1 month back. Pain has remain mostly unchanged since onset, and remains stable throughout the day, except for occasional relief while sitting in his comfy chair, arm supported. Pt says that his head turning has been very limited for a while but is now quite painful with head turns over left shoulder while driving. He denies difficulty donning doffing clothing. Denies trouble with washing hair or face. At night, the pain is exacerbated by lying on the side, so he  has been attempting to remain supine. He has taken some prescritption pain meds intermittently that have helped with the pain (meds are from an old surgery on the L shoulder, he is unable to say what they are.)       Currently in Pain? 8/10, L neck, near the attachment of the levator scap on the medial scapular border  Patient is unable to say whether his pain limits his sitting, standing, or walking.            [facility] PT Assessment - 08/07/15 0001     Assessment   Medical Diagnosis Cervical Spondylosis   Onset Date/Surgical Date 06/20/15   Hand Dominance Right   Next MD Visit unsure   Prior Therapy After L shoulder surgery   Precautions   Precautions None   Restrictions   Weight Bearing Restrictions No   Balance Screen   Has the patient fallen in the past 6 months No   Has the patient had a decrease in activity level because of a fear of falling?  No   Is the patient reluctant to leave their home because of a fear of falling?  No   Prior Function   Level of Independence Independent   Observation/Other Assessments   Focus on Therapeutic Outcomes (FOTO)  54% (46% impaired)    Sensation   Light Touch Appears Intact   Posture/Postural Control   Posture/Postural Control Postural limitations   Postural Limitations Rounded Shoulders;Increased thoracic kyphosis  Cervical extension is mildly better with combined overhead r   AROM   Cervical Flexion --  1 finger Chin to Sternum   Cervical Extension 25   Cervical - Right Side Bend 25   Cervical - Left Side Bend 17   Cervical - Right Rotation 67   Cervical - Left Rotation 60   Strength   Overall Strength Within functional limits for tasks performed  *see note for details             [facility] Adult PT Treatment/Exercise - 08/03/15 1535    Cervical retraction into pillow:  1x15x3sec Stargazer Stretch on towelroll: 5 minutes Atlantoaxial rotational stretch MET: 2x30sec bilat (manual)  Upper Trap Stretch: 2x30sec bilat (manual) Cervical Traction: 3 minutes (manual) Shoulder flexion AROM in supine with PVC: x20 Supine Bridging on Towel Roll: 1x15        PT Education - 08/07/15 0846    Education provided No          PT Short Term Goals - 08/03/15 1610    PT SHORT TERM GOAL #1   Title After 2 weeks, pt will demonstrate independence and compliance in an entry level home exercise program.    PT SHORT TERM GOAL #2   Title After 3 weeks, pt will demonstrate 50% improved  cervical rotation and sidebending to improve safety while driving.    PT SHORT TERM GOAL #3   Title After 3 weeks, pt will report less than 4/10 pain at night while sleeping to improve overall general health and quality of life.            PT Long Term Goals - 08/03/15 1615    PT LONG TERM GOAL #1   Title After 6 weeks, pt will demonstrate indep in advanced HEP to further progress in funcitonal mobility, and postural correction.    Status New   PT LONG TERM GOAL #2   Title After 6 weeks, pt will demonstrate less than 2/10 pain at night during sleep and throughout day  to demonstrate return to PLOF and improve qulaity of life.    Status New   PT LONG TERM GOAL #3   Title After 6 weeks, pt will demonstrate at pain free cervical rotation of 80 degrees bilaterally to improve safety while driving.    Status New         Plan - 08/27/2015 1615   Clinical Impression Statement --Pt presenting with limited cervical range of motion in rotation, and side bending with worsening of pain with left sided closing restrictions at the C4/C5 level that cause worsening of pain near the attachment of the levator on the medial scapula. MMT of all UE myotomes reveals 5/5 strength, and additional isolated testing of bilat rotator cuff muscles, serratus anterior, upper trap, middle trap, and posterior trap are all pain-free and 5/5.  Pt demonstrating mild postural thoracic kyphosis, that remains quite rigid with AROM, which when combined with forward head posture, put additional load on cervical spine for gross postural extension. Increased lordosis of cervical spine, limits facet closing available for cervical rotation. Additionally, pt's atlanto axial joint is hypomobile, which also creats and additional rotational load on the already limited lower cervical spine.    Pt will benefit from skilled therapeutic intervention in order to improve on the following deficits  Hypomobility;Decreased strength;Pain;Increased  muscle spasms;Improper body mechanics;Impaired flexibility;Postural dysfunction   Rehab Potential Good   PT Frequency 3x / week   PT Duration 6 weeks    PT Treatment/Interventions ADLs/Self Care Home Management;Moist Heat;Traction;Therapeutic exercise;Patient/family education;Manual techniques;Passive range of motion;   PT Next Visit Plan Review HEP in full. Thoracic AROM and stretching in 3 planes; continue with atlantoaxial stretches and MET.   PT Home Exercise Plan Issued   Consulted and Agree with Plan of Care Patient    G-Codes - 2015-08-27 1637      Functional Assessment Tool Used  FOTO/Clinical Judgment     Functional Limitation  Changing and maintaining body position     Changing and Maintaining Body Position Goal Status (N2778)  CK=At least 40 percent but less than 60 percent impaired, limited or restricted     Changing and Maintaining Body Position Discharge Status (E4235)  CJ= At least 20 percent but less than 40 percent impaired, limited or restricted          Problem List Patient Active Problem List   Diagnosis Date Noted  . Eosinophilic colitis 36/14/4315  . IBD (inflammatory bowel disease)   . Mucosal abnormality of colon   . Diverticulosis of colon with hemorrhage   . Intractable diarrhea   . Leukocytosis   . Acute renal failure ([place]) 03/25/2015  . Diabetes ([place]) 03/25/2015  . Chronic diarrhea of unknown origin 03/25/2015  . Rotator cuff tear 01/04/2013  . S/P rotator cuff repair 01/04/2013  . Arthritis, shoulder region 10/15/2012  . Biceps tendonitis on left 10/15/2012  . Adhesive bursitis of left shoulder 09/01/2012  . Partial tear of rotator cuff(726.13) 09/01/2012  . Pain in joint, shoulder region 07/09/2011  . Muscle weakness (generalized) 07/09/2011  . Bursitis of shoulder, left 07/03/2011    Buccola,Allan C 08/07/2015, 12:52 PM 12:58 PM  Etta Grandchild, PT, DPT [place] License # [id number]       [facility] [address] [place], [place], [address] Phone: [phone]   Fax:  [phone]  Name: Tasean Mancha MRN: [id number] Date of Birth: April 03, 1930

## 2015-08-08 ENCOUNTER — Other Ambulatory Visit: Payer: Self-pay | Admitting: Orthopedic Surgery

## 2015-08-09 NOTE — Telephone Encounter (Signed)
Patient's wife called stating patient would like to request something for the pain in his left shoulder, he is doing his therapy but when he tries to put shirts on he has a lot of pain. Please advise [phone]

## 2015-08-13 ENCOUNTER — Other Ambulatory Visit: Payer: Self-pay | Admitting: *Deleted

## 2015-08-13 DIAGNOSIS — M48061 Spinal stenosis, lumbar region without neurogenic claudication: Secondary | ICD-10-CM

## 2015-08-13 MED ORDER — GABAPENTIN 100 MG PO CAPS: 100.0000 mg | ORAL_CAPSULE | Freq: Three times a day (TID) | ORAL | Status: DC

## 2015-08-14 ENCOUNTER — Ambulatory Visit (HOSPITAL_COMMUNITY): Payer: Medicare Other

## 2015-08-14 DIAGNOSIS — R293 Abnormal posture: Secondary | ICD-10-CM

## 2015-08-14 DIAGNOSIS — M4000 Postural kyphosis, site unspecified: Secondary | ICD-10-CM

## 2015-08-14 DIAGNOSIS — M2560 Stiffness of unspecified joint, not elsewhere classified: Secondary | ICD-10-CM

## 2015-08-14 DIAGNOSIS — R278 Other lack of coordination: Secondary | ICD-10-CM

## 2015-08-14 DIAGNOSIS — M542 Cervicalgia: Secondary | ICD-10-CM

## 2015-08-14 DIAGNOSIS — M545 Low back pain, unspecified: Secondary | ICD-10-CM

## 2015-08-14 DIAGNOSIS — M6281 Muscle weakness (generalized): Secondary | ICD-10-CM

## 2015-08-14 DIAGNOSIS — M25519 Pain in unspecified shoulder: Secondary | ICD-10-CM

## 2015-08-14 DIAGNOSIS — M47812 Spondylosis without myelopathy or radiculopathy, cervical region: Secondary | ICD-10-CM

## 2015-08-14 NOTE — Therapy (Signed)
[facility] [address] [place], [place], [address] Phone: [phone]   Fax:  [phone]  Physical Therapy Treatment  Patient Details  Name: [name] MRN: [id number] Date of Birth: [birth year] Referring Provider: [name]  Encounter Date: 08/14/2015      PT End of Session - 08/14/15 0808    Visit Number 3   Number of Visits 18   Date for PT Re-Evaluation 09/04/15   Authorization Type U[employer] Medicare   Authorization Time Period 08/03/15-10/02/15   PT Start Time 0800   PT Stop Time 0849   PT Time Calculation (min) 49 min   Activity Tolerance Patient tolerated treatment well   Behavior During Therapy [facility] for tasks assessed/performed      Past Medical History  Diagnosis Date  . Diabetes mellitus   . Hypertension   . Hypercholesterolemia   . GERD (gastroesophageal reflux disease)   . Arthritis   . Eosinophilic colitis     Past Surgical History  Procedure Laterality Date  . Tonsillectomy    . Knee arthroscopy Left   . Shoulder arthroscopy with open rotator cuff repair Left 10/15/2012    Procedure: SHOULDER ARTHROSCOPY WITH OPEN ROTATOR CUFF REPAIR;  Surgeon: [name], MD;  Location: [facility];  Service: Orthopedics;  Laterality: Left;  open at 0912; with bicep tendon debridement;   . Shoulder acromioplasty Left 10/15/2012    Procedure: SHOULDER ACROMIOPLASTY;  Surgeon: [name], MD;  Location: [facility];  Service: Orthopedics;  Laterality: Left;  . Resection distal clavical Left 10/15/2012    Procedure: RESECTION DISTAL CLAVICAL;  Surgeon: [name], MD;  Location: [facility];  Service: Orthopedics;  Laterality: Left;  . Cataract extraction w/phaco Left 03/07/2014    Procedure: CATARACT EXTRACTION PHACO AND INTRAOCULAR LENS PLACEMENT LEFT EYE CDE=7.11;  Surgeon: [name], MD;  Location: [facility];  Service: Ophthalmology;  Laterality: Left;  . Cataract extraction w/phaco Right 03/14/2014   Procedure: CATARACT EXTRACTION PHACO AND INTRAOCULAR LENS PLACEMENT RIGHT EYE CDE=10.62;  Surgeon: [name], MD;  Location: [facility];  Service: Ophthalmology;  Laterality: Right;  . Colonoscopy  2009    Dr. Darrick PennaFields: pancolonic diverticulosis, internal hemorrhoids  . Colonoscopy N/A 03/29/2015    RMR: inflammatory changes in the proximal rectum, colon and  terminal ileum suspicious for inflammatory bowel disease. ie. primarily Crohns colitis. Status post biopsy as described. concomitant NSAID exposure could excaerbate pre-existing inflammatory bowel disease but I do not think todays finding are primarly related to recent ibuprofen use.     There were no vitals filed for this visit.  Visit Diagnosis:  Cervical spondylosis without myelopathy  Body posture problem  Pain in joint, shoulder region, unspecified laterality  Cervicalgia  Kyphosis (acquired) (postural)  Coordination abnormal  Stiffness in joint  Bilateral low back pain without sciatica  Muscle weakness (generalized)      Subjective Assessment - 08/14/15 0806    Subjective Pt stated Lt shoulder pain scale 5/10 when makes certain movements.   How long can you walk comfortably? Worse with walking, but unable to specify.    Patient Stated Goals To reduce pain, enable insurance MRI following therapy   Currently in Pain? Yes   Pain Score 5    Pain Location Shoulder   Pain Orientation Left   Pain Descriptors / Indicators --  "It just hurts"            [facility] Adult PT Treatment/Exercise - 08/14/15 0001  Neck Exercises: Seated   Neck Retraction 15 reps   Other Seated Exercise 3D cervical and thoracic excursions 1x15; backwards shoulder rolls 1x15   Other Seated Exercise scapular retractions and backward shoulder shrugs 1x15   Neck Exercises: Supine   Neck Retraction 15 reps;3 secs  Retraction into pillow   Other Supine Exercise star gazer   Other Supine Exercise Bridging with towel roll   Manual Therapy    Manual Therapy Soft tissue mobilization   Manual therapy comments Performed supine at end of session   Soft tissue mobilization L cervical extensors and upper trap    Manual Traction cervical traction in supine (manual 3 minutes)   Muscle Energy Technique bilat upper trap stretch 2x30sec (manual)           PT Short Term Goals - 08/03/15 1610    PT SHORT TERM GOAL #1   Title After 2 weeks, pt will demonstrate independence and compliance in an entry level home exercise program.    PT SHORT TERM GOAL #2   Title After 3 weeks, pt will demonstrate 50% improved cervical rotation and sidebending to improve safety while driving.    PT SHORT TERM GOAL #3   Title After 3 weeks, pt will report less than 4/10 pain at night while sleeping to improve overall general health and quality of life.            PT Long Term Goals - 08/03/15 1615    PT LONG TERM GOAL #1   Title After 6 weeks, pt will demonstrate indep in advanced HEP to further progress in funcitonal mobility, and postural correction.    Status New   PT LONG TERM GOAL #2   Title After 6 weeks, pt will demonstrate less than 2/10 pain at night during sleep and throughout day to demonstrate return to PLOF and improve qulaity of life.    Status New   PT LONG TERM GOAL #3   Title After 6 weeks, pt will demonstrate at pain free cervical rotation of 80 degrees bilaterally to improve safety while driving.    Status New               Plan - 08/14/15 5284    Clinical Impression Statement Session focus on improving cervical and thoracic ROM and improving awareness of posture.  Reviewed HEP to assess correct technique with minimal cuieng required.  Therapist facilitation for correct form and technqiue for all exercises through session.  Ended session wtih manual technqiues to reduce tightness with Lt cervical extensors and upper trapezius for pain control.  Pt reports pain reduced at end of session.     PT Next Visit Plan Continue  cervical and thoracic mobility and stretching in 3 planes; continue with atlantoxial stretches and progress posture following cervical and thoracic mobilty.        Problem List Patient Active Problem List   Diagnosis Date Noted  . Eosinophilic colitis 13/24/4010  . IBD (inflammatory bowel disease)   . Mucosal abnormality of colon   . Diverticulosis of colon with hemorrhage   . Intractable diarrhea   . Leukocytosis   . Acute renal failure (HCC) 03/25/2015  . Diabetes (HCC) 03/25/2015  . Chronic diarrhea of unknown origin 03/25/2015  . Rotator cuff tear 01/04/2013  . S/P rotator cuff repair 01/04/2013  . Arthritis, shoulder region 10/15/2012  . Biceps tendonitis on left 10/15/2012  . Adhesive bursitis of left shoulder 09/01/2012  . Partial tear of rotator cuff(726.13) 09/01/2012  . Pain  in joint, shoulder region 07/09/2011  . Muscle weakness (generalized) 07/09/2011  . Bursitis of shoulder, left 07/03/2011   Becky Sax, LPTA; CBIS [phone]  Juel Burrow 08/14/2015, 9:01 AM  [facility] [address] [place], [place], [address] Phone: [phone]   Fax:  [phone]  Name: [name] MRN: [id number] Date of Birth: 1930/05/11

## 2015-08-16 ENCOUNTER — Ambulatory Visit (HOSPITAL_COMMUNITY): Payer: Medicare Other

## 2015-08-16 DIAGNOSIS — M2560 Stiffness of unspecified joint, not elsewhere classified: Secondary | ICD-10-CM

## 2015-08-16 DIAGNOSIS — M542 Cervicalgia: Secondary | ICD-10-CM

## 2015-08-16 DIAGNOSIS — M47812 Spondylosis without myelopathy or radiculopathy, cervical region: Secondary | ICD-10-CM

## 2015-08-16 DIAGNOSIS — R293 Abnormal posture: Secondary | ICD-10-CM

## 2015-08-16 DIAGNOSIS — M545 Low back pain, unspecified: Secondary | ICD-10-CM

## 2015-08-16 DIAGNOSIS — R278 Other lack of coordination: Secondary | ICD-10-CM

## 2015-08-16 DIAGNOSIS — M4000 Postural kyphosis, site unspecified: Secondary | ICD-10-CM

## 2015-08-16 DIAGNOSIS — M6281 Muscle weakness (generalized): Secondary | ICD-10-CM

## 2015-08-16 DIAGNOSIS — M25519 Pain in unspecified shoulder: Secondary | ICD-10-CM

## 2015-08-16 NOTE — Therapy (Signed)
[facility] [address] [place], [place], [address] Phone: [phone]   Fax:  [phone]  Physical Therapy Treatment  Patient Details  Name: [name] MRN: [id number] Date of Birth: 23-Sep-1929 Referring Provider: [name]  Encounter Date: 08/16/2015      PT End of Session - 08/16/15 1352    Visit Number 4   Number of Visits 18   Date for PT Re-Evaluation 09/04/15   Authorization Type UHC Medicare   Authorization Time Period 08/03/15-10/02/15   PT Start Time 1348   PT Stop Time 1428   PT Time Calculation (min) 40 min   Activity Tolerance Patient tolerated treatment well   Behavior During Therapy [facility] for tasks assessed/performed      Past Medical History  Diagnosis Date  . Diabetes mellitus   . Hypertension   . Hypercholesterolemia   . GERD (gastroesophageal reflux disease)   . Arthritis   . Eosinophilic colitis     Past Surgical History  Procedure Laterality Date  . Tonsillectomy    . Knee arthroscopy Left   . Shoulder arthroscopy with open rotator cuff repair Left 10/15/2012    Procedure: SHOULDER ARTHROSCOPY WITH OPEN ROTATOR CUFF REPAIR;  Surgeon: [name], MD;  [facility]: [facility];  Service: Orthopedics;  Laterality: Left;  open at 0912; with bicep tendon debridement;   . Shoulder acromioplasty Left 10/15/2012    Procedure: SHOULDER ACROMIOPLASTY;  Surgeon: [name], MD;  [facility]: [facility];  Service: Orthopedics;  Laterality: Left;  . Resection distal clavical Left 10/15/2012    Procedure: RESECTION DISTAL CLAVICAL;  Surgeon: [name], MD;  [facility]: [facility];  Service: Orthopedics;  Laterality: Left;  . Cataract extraction w/phaco Left 03/07/2014    Procedure: CATARACT EXTRACTION PHACO AND INTRAOCULAR LENS PLACEMENT LEFT EYE CDE=7.11;  Surgeon: [name], MD;  Location: [facility];  Service: Ophthalmology;  Laterality: Left;  . Cataract extraction w/phaco Right 03/14/2014     Procedure: CATARACT EXTRACTION PHACO AND INTRAOCULAR LENS PLACEMENT RIGHT EYE CDE=10.62;  Surgeon: [name], MD;  Location: [facility];  Service: Ophthalmology;  Laterality: Right;  . Colonoscopy  2009    Dr. Darrick PennaFields: pancolonic diverticulosis, internal hemorrhoids  . Colonoscopy N/A 03/29/2015    RMR: inflammatory changes in the proximal rectum, colon and  terminal ileum suspicious for inflammatory bowel disease. ie. primarily Crohns colitis. Status post biopsy as described. concomitant NSAID exposure could excaerbate pre-existing inflammatory bowel disease but I do not think todays finding are primarly related to recent ibuprofen use.     There were no vitals filed for this visit.  Visit Diagnosis:  Cervical spondylosis without myelopathy  Body posture problem  Pain in joint, shoulder region, unspecified laterality  Cervicalgia  Kyphosis (acquired) (postural)  Coordination abnormal  Stiffness in joint  Bilateral low back pain without sciatica  Muscle weakness (generalized)      Subjective Assessment - 08/16/15 1350    Subjective Pt stated Lt neck/shoulder pain scale 5/10   Currently in Pain? Yes   Pain Score 5    Pain Location Neck   Pain Orientation Left   Pain Descriptors / Indicators --  "It just hurts"          [facility] Adult PT Treatment/Exercise - 08/16/15 0001    Neck Exercises: Seated   Other Seated Exercise 3D cervical and thoracic excursions 1x15; backwards shoulder rolls 1x15   Other Seated Exercise scapular retractions and backward shoulder shrugs 1x15   Neck  Exercises: Supine   Neck Retraction 15 reps;3 secs   Manual Therapy   Manual Therapy Soft tissue mobilization   Manual therapy comments Performed supine at end of session   Soft tissue mobilization L cervical extensors and upper trap    Manual Traction cervical traction in supine (manual 3 minutes)   Muscle Energy Technique bilat upper trap stretch 2x30sec (manual)   Neck Exercises: Stretches    Upper Trapezius Stretch 3 reps;30 seconds   Corner Stretch 3 reps;30 seconds             PT Short Term Goals - 08/03/15 1610    PT SHORT TERM GOAL #1   Title After 2 weeks, pt will demonstrate independence and compliance in an entry level home exercise program.    PT SHORT TERM GOAL #2   Title After 3 weeks, pt will demonstrate 50% improved cervical rotation and sidebending to improve safety while driving.    PT SHORT TERM GOAL #3   Title After 3 weeks, pt will report less than 4/10 pain at night while sleeping to improve overall general health and quality of life.            PT Long Term Goals - 08/03/15 1615    PT LONG TERM GOAL #1   Title After 6 weeks, pt will demonstrate indep in advanced HEP to further progress in funcitonal mobility, and postural correction.    Status New   PT LONG TERM GOAL #2   Title After 6 weeks, pt will demonstrate less than 2/10 pain at night during sleep and throughout day to demonstrate return to PLOF and improve qulaity of life.    Status New   PT LONG TERM GOAL #3   Title After 6 weeks, pt will demonstrate at pain free cervical rotation of 80 degrees bilaterally to improve safety while driving.    Status New               Plan - 08/16/15 1405    Clinical Impression Statement Session focus on improving spinal mobility to improve cervical and thoracic ROM and education of proper posture.  Therapist facilitaiton required with all exercices to complete with proper form and technqiues.  Added stretches to improve flexibiltiy with upper traps and pectoralis to improve ROM.  Ended session with manual soft tissue mobilizaiton techniques to reduce tightness Lt upper trap, pecs and extensor musculature.  No reports of increased pain through session.     PT Next Visit Plan Continue cervical and thoracic mobility and stretching in 3 planes; continue with atlantoxial stretches and progress posture following cervical and thoracic mobilty.         Problem List Patient Active Problem List   Diagnosis Date Noted  . Eosinophilic colitis 40/98/1191  . IBD (inflammatory bowel disease)   . Mucosal abnormality of colon   . Diverticulosis of colon with hemorrhage   . Intractable diarrhea   . Leukocytosis   . Acute renal failure (HCC) 03/25/2015  . Diabetes (HCC) 03/25/2015  . Chronic diarrhea of unknown origin 03/25/2015  . Rotator cuff tear 01/04/2013  . S/P rotator cuff repair 01/04/2013  . Arthritis, shoulder region 10/15/2012  . Biceps tendonitis on left 10/15/2012  . Adhesive bursitis of left shoulder 09/01/2012  . Partial tear of rotator cuff(726.13) 09/01/2012  . Pain in joint, shoulder region 07/09/2011  . Muscle weakness (generalized) 07/09/2011  . Bursitis of shoulder, left 07/03/2011   Becky Sax, LPTA; CBIS [phone]  Juel Burrow 08/16/2015,  2:31 PM  [facility] [address] [place], [place], [address] Phone: [phone]   Fax:  [phone]  Name: [name] MRN: [id number] Date of Birth: 1930-01-14

## 2015-08-22 ENCOUNTER — Ambulatory Visit (HOSPITAL_COMMUNITY): Payer: Medicare Other | Admitting: Physical Therapy

## 2015-08-22 DIAGNOSIS — M2560 Stiffness of unspecified joint, not elsewhere classified: Secondary | ICD-10-CM

## 2015-08-22 DIAGNOSIS — M47812 Spondylosis without myelopathy or radiculopathy, cervical region: Secondary | ICD-10-CM | POA: Diagnosis not present

## 2015-08-22 DIAGNOSIS — R293 Abnormal posture: Secondary | ICD-10-CM

## 2015-08-22 DIAGNOSIS — M4000 Postural kyphosis, site unspecified: Secondary | ICD-10-CM

## 2015-08-22 DIAGNOSIS — M542 Cervicalgia: Secondary | ICD-10-CM

## 2015-08-22 NOTE — Therapy (Signed)
[facility] [address] [place], [place], [address] Phone: [phone]   Fax:  [phone]  Physical Therapy Treatment  Patient Details  Name: [name] MRN: [id number] Date of Birth: May 21, 1930 Referring Provider: Fuller Canada  Encounter Date: 08/22/2015      PT End of Session - 08/22/15 1426    Visit Number 5   Number of Visits 18   Date for PT Re-Evaluation 09/04/15   Authorization - Visit Number 5   Authorization - Number of Visits 10   PT Start Time 1347   PT Stop Time 1426   PT Time Calculation (min) 39 min   Activity Tolerance Patient tolerated treatment well      Past Medical History  Diagnosis Date  . Diabetes mellitus   . Hypertension   . Hypercholesterolemia   . GERD (gastroesophageal reflux disease)   . Arthritis   . Eosinophilic colitis     Past Surgical History  Procedure Laterality Date  . Tonsillectomy    . Knee arthroscopy Left   . Shoulder arthroscopy with open rotator cuff repair Left 10/15/2012    Procedure: SHOULDER ARTHROSCOPY WITH OPEN ROTATOR CUFF REPAIR;  Surgeon: Vickki Hearing, MD;  [facility]: [facility];  Service: Orthopedics;  Laterality: Left;  open at 0912; with bicep tendon debridement;   . Shoulder acromioplasty Left 10/15/2012    Procedure: SHOULDER ACROMIOPLASTY;  Surgeon: Vickki Hearing, MD;  Location: [facility];  Service: Orthopedics;  Laterality: Left;  . Resection distal clavical Left 10/15/2012    Procedure: RESECTION DISTAL CLAVICAL;  Surgeon: Vickki Hearing, MD;  Location: [facility];  Service: Orthopedics;  Laterality: Left;  . Cataract extraction w/phaco Left 03/07/2014    Procedure: CATARACT EXTRACTION PHACO AND INTRAOCULAR LENS PLACEMENT LEFT EYE CDE=7.11;  Surgeon: Loraine Leriche T. Nile Riggs, MD;  Location: [facility];  Service: Ophthalmology;  Laterality: Left;  . Cataract extraction w/phaco Right 03/14/2014    Procedure: CATARACT EXTRACTION PHACO AND INTRAOCULAR LENS PLACEMENT RIGHT EYE  CDE=10.62;  Surgeon: Loraine Leriche T. Nile Riggs, MD;  Location: [facility];  Service: Ophthalmology;  Laterality: Right;  . Colonoscopy  2009    Dr. Darrick Penna: pancolonic diverticulosis, internal hemorrhoids  . Colonoscopy N/A 03/29/2015    RMR: inflammatory changes in the proximal rectum, colon and  terminal ileum suspicious for inflammatory bowel disease. ie. primarily Crohns colitis. Status post biopsy as described. concomitant NSAID exposure could excaerbate pre-existing inflammatory bowel disease but I do not think todays finding are primarly related to recent ibuprofen use.     There were no vitals filed for this visit.  Visit Diagnosis:  Cervical spondylosis without myelopathy  Body posture problem  Cervicalgia  Kyphosis (acquired) (postural)  Stiffness in joint      Subjective Assessment - 08/22/15 1351    Subjective Pt is doing his exercises everyday but states that the pain does not seem to ease up.  He is in no pain right now but states when he tries to move around he has increased pain.    Currently in Pain? No/denies                         [facility] Adult PT Treatment/Exercise - 08/22/15 0001    Neck Exercises: Standing   Other Standing Exercises corner stretch x 15" x 4    Neck Exercises: Seated   Neck Retraction 15 reps   Other Seated Exercise 3D cervical and thoracic excursions 1x15; backwards shoulder rolls 1x15; B  ER x 10    Other Seated Exercise scapular retractions and backward shoulder shrugs 1x15   Neck Exercises: Supine   Neck Retraction 15 reps;3 secs   Other Supine Exercise decompression exercises 1-5    Manual Therapy   Manual Therapy Soft tissue mobilization   Manual therapy comments Performed supine at end of session   Soft tissue mobilization L cervical extensors and upper trap    Manual Traction cervical traction in supine (manual 3 minutes)   Muscle Energy Technique bilat upper trap stretch 2x30sec (manual)   Neck Exercises: Stretches   Upper  Trapezius Stretch 3 reps;30 seconds   Corner Stretch 3 reps;30 seconds                PT Education - 08/22/15 1425    Education provided Yes   Education Details the importance of posture in neck care.   Person(s) Educated Patient;Spouse   Methods Explanation;Tactile cues   Comprehension Verbalized understanding          PT Short Term Goals - 08/03/15 1610    PT SHORT TERM GOAL #1   Title After 2 weeks, pt will demonstrate independence and compliance in an entry level home exercise program.    PT SHORT TERM GOAL #2   Title After 3 weeks, pt will demonstrate 50% improved cervical rotation and sidebending to improve safety while driving.    PT SHORT TERM GOAL #3   Title After 3 weeks, pt will report less than 4/10 pain at night while sleeping to improve overall general health and quality of life.            PT Long Term Goals - 08/03/15 1615    PT LONG TERM GOAL #1   Title After 6 weeks, pt will demonstrate indep in advanced HEP to further progress in funcitonal mobility, and postural correction.    Status New   PT LONG TERM GOAL #2   Title After 6 weeks, pt will demonstrate less than 2/10 pain at night during sleep and throughout day to demonstrate return to PLOF and improve qulaity of life.    Status New   PT LONG TERM GOAL #3   Title After 6 weeks, pt will demonstrate at pain free cervical rotation of 80 degrees bilaterally to improve safety while driving.    Status New               Plan - 08/22/15 1426    Clinical Impression Statement Therapist educated pt on the strain having a forward head has on the neck mmm.  Added corner stretch for improved posture.  Began supine decomprssion exercises.  Pt needed therapist facilitation for proper posture while completing exercise.    Pt will benefit from skilled therapeutic intervention in order to improve on the following deficits Hypomobility;Decreased strength;Pain;Increased muscle spasms;Improper body  mechanics;Impaired flexibility;Postural dysfunction   Rehab Potential Good   PT Frequency 3x / week   PT Duration 6 weeks   PT Treatment/Interventions ADLs/Self Care Home Management;Moist Heat;Traction;Therapeutic exercise;Patient/family education;Manual techniques;Passive range of motion;Ultrasound   PT Next Visit Plan Continue to stress the importance of posture.  May add Ultrasound to see if it assists with pain relief.  Give pt handout on supine decompression exercises.         Problem List Patient Active Problem List   Diagnosis Date Noted  . Eosinophilic colitis 16/05/9603  . IBD (inflammatory bowel disease)   . Mucosal abnormality of colon   . Diverticulosis of colon with  hemorrhage   . Intractable diarrhea   . Leukocytosis   . Acute renal failure (HCC) 03/25/2015  . Diabetes (HCC) 03/25/2015  . Chronic diarrhea of unknown origin 03/25/2015  . Rotator cuff tear 01/04/2013  . S/P rotator cuff repair 01/04/2013  . Arthritis, shoulder region 10/15/2012  . Biceps tendonitis on left 10/15/2012  . Adhesive bursitis of left shoulder 09/01/2012  . Partial tear of rotator cuff(726.13) 09/01/2012  . Pain in joint, shoulder region 07/09/2011  . Muscle weakness (generalized) 07/09/2011  . Bursitis of shoulder, left 07/03/2011    Virgina Organ, PT CLT [phone] 08/22/2015, 2:32 PM  [facility] [address] [place], [place], [address] Phone: [phone]   Fax:  [phone]  Name: [name] MRN: [id number] Date of Birth: 11-16-1929

## 2015-08-24 ENCOUNTER — Ambulatory Visit (HOSPITAL_COMMUNITY): Payer: Medicare Other | Admitting: Physical Therapy

## 2015-08-24 DIAGNOSIS — M6281 Muscle weakness (generalized): Secondary | ICD-10-CM

## 2015-08-24 DIAGNOSIS — M2560 Stiffness of unspecified joint, not elsewhere classified: Secondary | ICD-10-CM

## 2015-08-24 DIAGNOSIS — R293 Abnormal posture: Secondary | ICD-10-CM

## 2015-08-24 DIAGNOSIS — M47812 Spondylosis without myelopathy or radiculopathy, cervical region: Secondary | ICD-10-CM

## 2015-08-24 DIAGNOSIS — M4000 Postural kyphosis, site unspecified: Secondary | ICD-10-CM

## 2015-08-24 DIAGNOSIS — M542 Cervicalgia: Secondary | ICD-10-CM

## 2015-08-24 NOTE — Therapy (Signed)
[facility] [address] [place], [place], [address] Phone: [phone]   Fax:  [phone]  Physical Therapy Treatment  Patient Details  Name: [name] MRN: [id number] Date of Birth: Apr 19, 1930 Referring Provider: Fuller Canada  Encounter Date: 08/24/2015      PT End of Session - 08/24/15 1431    Visit Number 6   Number of Visits 10   Date for PT Re-Evaluation 09/04/15   Authorization Type UHC Medicare   Authorization Time Period 08/03/15-10/02/15   Authorization - Visit Number 6   Authorization - Number of Visits 10   PT Start Time 1347   PT Stop Time 1428   PT Time Calculation (min) 41 min   Activity Tolerance Patient tolerated treatment well   Behavior During Therapy [facility] for tasks assessed/performed      Past Medical History  Diagnosis Date  . Diabetes mellitus   . Hypertension   . Hypercholesterolemia   . GERD (gastroesophageal reflux disease)   . Arthritis   . Eosinophilic colitis     Past Surgical History  Procedure Laterality Date  . Tonsillectomy    . Knee arthroscopy Left   . Shoulder arthroscopy with open rotator cuff repair Left 10/15/2012    Procedure: SHOULDER ARTHROSCOPY WITH OPEN ROTATOR CUFF REPAIR;  Surgeon: Vickki Hearing, MD;  Location: [facility];  Service: Orthopedics;  Laterality: Left;  open at 0912; with bicep tendon debridement;   . Shoulder acromioplasty Left 10/15/2012    Procedure: SHOULDER ACROMIOPLASTY;  Surgeon: Vickki Hearing, MD;  Location: [facility];  Service: Orthopedics;  Laterality: Left;  . Resection distal clavical Left 10/15/2012    Procedure: RESECTION DISTAL CLAVICAL;  Surgeon: Vickki Hearing, MD;  Location: [facility];  Service: Orthopedics;  Laterality: Left;  . Cataract extraction w/phaco Left 03/07/2014    Procedure: CATARACT EXTRACTION PHACO AND INTRAOCULAR LENS PLACEMENT LEFT EYE CDE=7.11;  Surgeon: Loraine Leriche T. Nile Riggs, MD;  Location: [facility];  Service: Ophthalmology;   Laterality: Left;  . Cataract extraction w/phaco Right 03/14/2014    Procedure: CATARACT EXTRACTION PHACO AND INTRAOCULAR LENS PLACEMENT RIGHT EYE CDE=10.62;  Surgeon: Loraine Leriche T. Nile Riggs, MD;  Location: [facility];  Service: Ophthalmology;  Laterality: Right;  . Colonoscopy  2009    Dr. Darrick Penna: pancolonic diverticulosis, internal hemorrhoids  . Colonoscopy N/A 03/29/2015    RMR: inflammatory changes in the proximal rectum, colon and  terminal ileum suspicious for inflammatory bowel disease. ie. primarily Crohns colitis. Status post biopsy as described. concomitant NSAID exposure could excaerbate pre-existing inflammatory bowel disease but I do not think todays finding are primarly related to recent ibuprofen use.     There were no vitals filed for this visit.  Visit Diagnosis:  Cervical spondylosis without myelopathy  Body posture problem  Kyphosis (acquired) (postural)  Cervicalgia  Stiffness in joint  Muscle weakness (generalized)      Subjective Assessment - 08/24/15 1413    Subjective Pt states that his pain seems to be better toay   Currently in Pain? Yes   Pain Score 2    Pain Location Neck   Pain Orientation Left   Pain Descriptors / Indicators Aching                         [facility] Adult PT Treatment/Exercise - 08/24/15 0001    Neck Exercises: Machines for Strengthening   UBE (Upper Arm Bike) x 3' backward only to improve strength of postural mm  Neck Exercises: Theraband   Scapula Retraction 10 reps;Green   Shoulder Extension 10 reps;Green   Rows 10 reps;Green   Neck Exercises: Standing   Other Standing Exercises corner stretch x 15" x 4    Other Standing Exercises stand at wall with B UE flexion making sure back and head stay against the wall.    Neck Exercises: Seated   Other Seated Exercise 3D cervical and thoracic excursions 1x15; backwards shoulder rolls 1x15; B ER x 10    Other Seated Exercise scapular retractions and backward shoulder shrugs 1x15    Neck Exercises: Supine   Neck Retraction 15 reps;3 secs   Modalities   Modalities Ultrasound   Ultrasound   Ultrasound Location LT upper trap/cervical    Ultrasound Parameters 1.5 w/cm2 x 8'   Ultrasound Goals Pain   Manual Therapy   Manual Therapy Soft tissue mobilization   Manual therapy comments Performed supine at end of session   Soft tissue mobilization L cervical extensors and upper trap    Manual Traction cervical traction in supine (manual 3 minutes)   Muscle Energy Technique bilat upper trap stretch 2x30sec (manual)          manual was completed separate from all other aspects of treatment.       PT Education - 08/24/15 1430    Education provided Yes   Education Details reemphasised posture    Person(s) Educated Patient;Spouse   Methods Explanation   Comprehension Verbalized understanding          PT Short Term Goals - 08/03/15 1610    PT SHORT TERM GOAL #1   Title After 2 weeks, pt will demonstrate independence and compliance in an entry level home exercise program.    PT SHORT TERM GOAL #2   Title After 3 weeks, pt will demonstrate 50% improved cervical rotation and sidebending to improve safety while driving.    PT SHORT TERM GOAL #3   Title After 3 weeks, pt will report less than 4/10 pain at night while sleeping to improve overall general health and quality of life.            PT Long Term Goals - 08/03/15 1615    PT LONG TERM GOAL #1   Title After 6 weeks, pt will demonstrate indep in advanced HEP to further progress in funcitonal mobility, and postural correction.    Status New   PT LONG TERM GOAL #2   Title After 6 weeks, pt will demonstrate less than 2/10 pain at night during sleep and throughout day to demonstrate return to PLOF and improve qulaity of life.    Status New   PT LONG TERM GOAL #3   Title After 6 weeks, pt will demonstrate at pain free cervical rotation of 80 degrees bilaterally to improve safety while driving.    Status  New               Plan - 08/24/15 1431    Clinical Impression Statement Pt with decreased pain this session; states pain is at a 0 at end of session.  Pt states he is unable to attend all visits secondary to financial stress.  Therapist gave pt decompression exercise sheet.  Therapist continues to need to facilitate exercises for proper form and decreased substitutional patterns.     Pt will benefit from skilled therapeutic intervention in order to improve on the following deficits Hypomobility;Decreased strength;Pain;Increased muscle spasms;Improper body mechanics;Impaired flexibility;Postural dysfunction   PT Frequency 3x / week  PT Duration 4 weeks   PT Treatment/Interventions ADLs/Self Care Home Management;Moist Heat;Traction;Therapeutic exercise;Patient/family education;Manual techniques;Passive range of motion;Ultrasound   PT Next Visit Plan begin prone axial extension, rows; scapular extension, continue with ultrasound         Problem List Patient Active Problem List   Diagnosis Date Noted  . Eosinophilic colitis 04/54/0981  . IBD (inflammatory bowel disease)   . Mucosal abnormality of colon   . Diverticulosis of colon with hemorrhage   . Intractable diarrhea   . Leukocytosis   . Acute renal failure (HCC) 03/25/2015  . Diabetes (HCC) 03/25/2015  . Chronic diarrhea of unknown origin 03/25/2015  . Rotator cuff tear 01/04/2013  . S/P rotator cuff repair 01/04/2013  . Arthritis, shoulder region 10/15/2012  . Biceps tendonitis on left 10/15/2012  . Adhesive bursitis of left shoulder 09/01/2012  . Partial tear of rotator cuff(726.13) 09/01/2012  . Pain in joint, shoulder region 07/09/2011  . Muscle weakness (generalized) 07/09/2011  . Bursitis of shoulder, left 07/03/2011   Virgina Organ, PT CLT [phone] 08/24/2015, 2:37 PM  [facility] [address] [place], [place], [address] Phone: [phone]   Fax:   [phone]  Name: [name] MRN: [id number] Date of Birth: 1930-02-18

## 2015-08-28 ENCOUNTER — Ambulatory Visit (HOSPITAL_COMMUNITY): Payer: Medicare Other | Admitting: Physical Therapy

## 2015-08-28 DIAGNOSIS — R293 Abnormal posture: Secondary | ICD-10-CM

## 2015-08-28 DIAGNOSIS — M47812 Spondylosis without myelopathy or radiculopathy, cervical region: Secondary | ICD-10-CM | POA: Diagnosis not present

## 2015-08-28 DIAGNOSIS — M542 Cervicalgia: Secondary | ICD-10-CM

## 2015-08-28 DIAGNOSIS — M4000 Postural kyphosis, site unspecified: Secondary | ICD-10-CM

## 2015-08-28 NOTE — Therapy (Signed)
[facility] [address] [place], [place], [address] Phone: [phone]   Fax:  [phone]  Physical Therapy Treatment  Patient Details  Name: [name] MRN: [id number] Date of Birth: 1929-09-13 Referring Provider: Fuller Canada  Encounter Date: 08/28/2015      PT End of Session - 08/28/15 0933    Visit Number 7   Number of Visits 10   Date for PT Re-Evaluation 09/04/15   Authorization Type UHC Medicare   Authorization Time Period 08/03/15-10/02/15   Authorization - Visit Number 7   Authorization - Number of Visits 10   PT Start Time 0847   PT Stop Time 0926   PT Time Calculation (min) 39 min   Activity Tolerance Patient tolerated treatment well   Behavior During Therapy [facility] for tasks assessed/performed      Past Medical History  Diagnosis Date  . Diabetes mellitus   . Hypertension   . Hypercholesterolemia   . GERD (gastroesophageal reflux disease)   . Arthritis   . Eosinophilic colitis     Past Surgical History  Procedure Laterality Date  . Tonsillectomy    . Knee arthroscopy Left   . Shoulder arthroscopy with open rotator cuff repair Left 10/15/2012    Procedure: SHOULDER ARTHROSCOPY WITH OPEN ROTATOR CUFF REPAIR;  Surgeon: Vickki Hearing, MD;  Location: [facility];  Service: Orthopedics;  Laterality: Left;  open at 0912; with bicep tendon debridement;   . Shoulder acromioplasty Left 10/15/2012    Procedure: SHOULDER ACROMIOPLASTY;  Surgeon: Vickki Hearing, MD;  Location: [facility];  Service: Orthopedics;  Laterality: Left;  . Resection distal clavical Left 10/15/2012    Procedure: RESECTION DISTAL CLAVICAL;  Surgeon: Vickki Hearing, MD;  Location: [facility];  Service: Orthopedics;  Laterality: Left;  . Cataract extraction w/phaco Left 03/07/2014    Procedure: CATARACT EXTRACTION PHACO AND INTRAOCULAR LENS PLACEMENT LEFT EYE CDE=7.11;  Surgeon: Loraine Leriche T. Nile Riggs, MD;  Location: [facility];  Service: Ophthalmology;   Laterality: Left;  . Cataract extraction w/phaco Right 03/14/2014    Procedure: CATARACT EXTRACTION PHACO AND INTRAOCULAR LENS PLACEMENT RIGHT EYE CDE=10.62;  Surgeon: Loraine Leriche T. Nile Riggs, MD;  Location: [facility];  Service: Ophthalmology;  Laterality: Right;  . Colonoscopy  2009    Dr. Darrick Penna: pancolonic diverticulosis, internal hemorrhoids  . Colonoscopy N/A 03/29/2015    RMR: inflammatory changes in the proximal rectum, colon and  terminal ileum suspicious for inflammatory bowel disease. ie. primarily Crohns colitis. Status post biopsy as described. concomitant NSAID exposure could excaerbate pre-existing inflammatory bowel disease but I do not think todays finding are primarly related to recent ibuprofen use.     There were no vitals filed for this visit.  Visit Diagnosis:  Cervical spondylosis without myelopathy  Body posture problem  Kyphosis (acquired) (postural)  Cervicalgia      Subjective Assessment - 08/28/15 0847    Subjective Patient reports he is not having any pain right now, feeling much better overall    Currently in Pain? No/denies                         [facility] Adult PT Treatment/Exercise - 08/28/15 0001    Neck Exercises: Theraband   Scapula Retraction Green;15 reps   Shoulder Extension 15 reps;Green   Neck Exercises: Seated   Neck Retraction 20 reps   Other Seated Exercise 3D cervical and thoracic excursions 1x15; backwards shoulder rolls 1x15; B ER x 10  Neck Exercises: Prone   Neck Retraction 10 reps;Other (comment)   Neck Retraction Limitations cues for form    Other Prone Exercise attempted Ws but unable; Is 1x10    Manual Therapy   Manual Therapy Soft tissue mobilization   Manual therapy comments performed seated at end of session from all otehr skilled interventions    Soft tissue mobilization cervical extensors and upper trpas    Neck Exercises: Stretches   Upper Trapezius Stretch 3 reps;30 seconds   Corner Stretch 3 reps;30 seconds                 PT Education - 08/28/15 0931    Education provided No          PT Short Term Goals - 08/03/15 1610    PT SHORT TERM GOAL #1   Title After 2 weeks, pt will demonstrate independence and compliance in an entry level home exercise program.    PT SHORT TERM GOAL #2   Title After 3 weeks, pt will demonstrate 50% improved cervical rotation and sidebending to improve safety while driving.    PT SHORT TERM GOAL #3   Title After 3 weeks, pt will report less than 4/10 pain at night while sleeping to improve overall general health and quality of life.            PT Long Term Goals - 08/03/15 1615    PT LONG TERM GOAL #1   Title After 6 weeks, pt will demonstrate indep in advanced HEP to further progress in funcitonal mobility, and postural correction.    Status New   PT LONG TERM GOAL #2   Title After 6 weeks, pt will demonstrate less than 2/10 pain at night during sleep and throughout day to demonstrate return to PLOF and improve qulaity of life.    Status New   PT LONG TERM GOAL #3   Title After 6 weeks, pt will demonstrate at pain free cervical rotation of 80 degrees bilaterally to improve safety while driving.    Status New               Plan - 08/28/15 0945    Clinical Impression Statement Patient continues to have decrased pain in his neck, with no pain at beginning of session. Performed functional exercises today and introduced prone exercises today with difficulty in performing W exercises today but able to perform Is; initial  difficulty with prone neck retractions but able to perform correctly with cues. VA test appears neagive today. Finished session with manual to cervical extensors and upper traps. No pain at end of session.    Pt will benefit from skilled therapeutic intervention in order to improve on the following deficits Hypomobility;Decreased strength;Pain;Increased muscle spasms;Improper body mechanics;Impaired flexibility;Postural dysfunction    Rehab Potential Good   PT Frequency 3x / week   PT Duration 4 weeks   PT Treatment/Interventions ADLs/Self Care Home Management;Moist Heat;Traction;Therapeutic exercise;Patient/family education;Manual techniques;Passive range of motion;Ultrasound   PT Next Visit Plan begin prone axial extension, rows; scapular extension, continue with ultrasound    PT Home Exercise Plan issued.    Consulted and Agree with Plan of Care Patient        Problem List Patient Active Problem List   Diagnosis Date Noted  . Eosinophilic colitis 16/05/9603  . IBD (inflammatory bowel disease)   . Mucosal abnormality of colon   . Diverticulosis of colon with hemorrhage   . Intractable diarrhea   . Leukocytosis   . Acute  renal failure (HCC) 03/25/2015  . Diabetes (HCC) 03/25/2015  . Chronic diarrhea of unknown origin 03/25/2015  . Rotator cuff tear 01/04/2013  . S/P rotator cuff repair 01/04/2013  . Arthritis, shoulder region 10/15/2012  . Biceps tendonitis on left 10/15/2012  . Adhesive bursitis of left shoulder 09/01/2012  . Partial tear of rotator cuff(726.13) 09/01/2012  . Pain in joint, shoulder region 07/09/2011  . Muscle weakness (generalized) 07/09/2011  . Bursitis of shoulder, left 07/03/2011    Nedra Hai PT, DPT [phone]  [facility] [address] [place], [place], [address] Phone: [phone]   Fax:  [phone]  Name: [name] MRN: [id number] Date of Birth: Jul 16, 1930

## 2015-09-03 ENCOUNTER — Ambulatory Visit (HOSPITAL_COMMUNITY): Payer: Medicare Other

## 2015-09-03 DIAGNOSIS — M545 Low back pain, unspecified: Secondary | ICD-10-CM

## 2015-09-03 DIAGNOSIS — R278 Other lack of coordination: Secondary | ICD-10-CM

## 2015-09-03 DIAGNOSIS — M47812 Spondylosis without myelopathy or radiculopathy, cervical region: Secondary | ICD-10-CM

## 2015-09-03 DIAGNOSIS — M2560 Stiffness of unspecified joint, not elsewhere classified: Secondary | ICD-10-CM

## 2015-09-03 DIAGNOSIS — M25519 Pain in unspecified shoulder: Secondary | ICD-10-CM

## 2015-09-03 DIAGNOSIS — M6281 Muscle weakness (generalized): Secondary | ICD-10-CM

## 2015-09-03 DIAGNOSIS — M542 Cervicalgia: Secondary | ICD-10-CM

## 2015-09-03 DIAGNOSIS — R293 Abnormal posture: Secondary | ICD-10-CM

## 2015-09-03 DIAGNOSIS — M4000 Postural kyphosis, site unspecified: Secondary | ICD-10-CM

## 2015-09-03 NOTE — Therapy (Signed)
[place] [facility] [address] [place], [place], [address] Phone: [phone]   Fax:  [phone]  Physical Therapy Treatment  Patient Details  Name: [name] MRN: [id number] Date of Birth: Aug 22, 1929 Referring Provider: Fuller Canada  Encounter Date: 09/03/2015      PT End of Session - 09/03/15 0942    Visit Number 8   Number of Visits 10   Date for PT Re-Evaluation 09/04/15   Authorization Type UHC Medicare   Authorization Time Period 08/03/15-10/02/15   Authorization - Visit Number 8   Authorization - Number of Visits 10   PT Start Time 0935   PT Stop Time 1018   PT Time Calculation (min) 43 min   Activity Tolerance Patient tolerated treatment well   Behavior During Therapy [facility] for tasks assessed/performed      Past Medical History  Diagnosis Date  . Diabetes mellitus   . Hypertension   . Hypercholesterolemia   . GERD (gastroesophageal reflux disease)   . Arthritis   . Eosinophilic colitis     Past Surgical History  Procedure Laterality Date  . Tonsillectomy    . Knee arthroscopy Left   . Shoulder arthroscopy with open rotator cuff repair Left 10/15/2012    Procedure: SHOULDER ARTHROSCOPY WITH OPEN ROTATOR CUFF REPAIR;  Surgeon: Vickki Hearing, MD;  Location: [facility];  Service: Orthopedics;  Laterality: Left;  open at 0912; with bicep tendon debridement;   . Shoulder acromioplasty Left 10/15/2012    Procedure: SHOULDER ACROMIOPLASTY;  Surgeon: Vickki Hearing, MD;  Location: [facility];  Service: Orthopedics;  Laterality: Left;  . Resection distal clavical Left 10/15/2012    Procedure: RESECTION DISTAL CLAVICAL;  Surgeon: Vickki Hearing, MD;  Location: [facility];  Service: Orthopedics;  Laterality: Left;  . Cataract extraction w/phaco Left 03/07/2014    Procedure: CATARACT EXTRACTION PHACO AND INTRAOCULAR LENS PLACEMENT LEFT EYE CDE=7.11;  Surgeon: Loraine Leriche T. Nile Riggs, MD;  Location: [facility];  Service: Ophthalmology;   Laterality: Left;  . Cataract extraction w/phaco Right 03/14/2014    Procedure: CATARACT EXTRACTION PHACO AND INTRAOCULAR LENS PLACEMENT RIGHT EYE CDE=10.62;  Surgeon: Loraine Leriche T. Nile Riggs, MD;  Location: [facility];  Service: Ophthalmology;  Laterality: Right;  . Colonoscopy  2009    Dr. Darrick Penna: pancolonic diverticulosis, internal hemorrhoids  . Colonoscopy N/A 03/29/2015    RMR: inflammatory changes in the proximal rectum, colon and  terminal ileum suspicious for inflammatory bowel disease. ie. primarily Crohns colitis. Status post biopsy as described. concomitant NSAID exposure could excaerbate pre-existing inflammatory bowel disease but I do not think todays finding are primarly related to recent ibuprofen use.     There were no vitals filed for this visit.  Visit Diagnosis:  Cervical spondylosis without myelopathy  Body posture problem  Kyphosis (acquired) (postural)  Cervicalgia  Stiffness in joint  Muscle weakness (generalized)  Pain in joint, shoulder region, unspecified laterality  Coordination abnormal  Bilateral low back pain without sciatica      Subjective Assessment - 09/03/15 0940    Subjective Pt stated he is pain free today, has been feeling better.   Patient Stated Goals To reduce pain, enable insurance MRI following therapy   Currently in Pain? No/denies            [facility] Adult PT Treatment/Exercise - 09/03/15 0001    Neck Exercises: Machines for Strengthening   UBE (Upper Arm Bike) x 4' backward only to improve strength of postural mm   Neck Exercises: Theraband  Scapula Retraction Green;15 reps   Shoulder Extension 15 reps;Green   Rows 15 reps;Green   Neck Exercises: Standing   Other Standing Exercises corner stretch x 15" x 4    Neck Exercises: Seated   Neck Retraction 20 reps   Shoulder Rolls Backwards;15 reps   Shoulder Rolls Limitations shoulders up, back and down with therapist facilitaiotn   Other Seated Exercise 3D thoracic excursion 15x   Neck  Exercises: Prone   Axial Exentsion 10 reps;Limitations   Axial Extension Limitations chin tuck and head lift   Shoulder Extension 15 reps   Rows Limitations 10x            PT Short Term Goals - 08/03/15 1610    PT SHORT TERM GOAL #1   Title After 2 weeks, pt will demonstrate independence and compliance in an entry level home exercise program.    PT SHORT TERM GOAL #2   Title After 3 weeks, pt will demonstrate 50% improved cervical rotation and sidebending to improve safety while driving.    PT SHORT TERM GOAL #3   Title After 3 weeks, pt will report less than 4/10 pain at night while sleeping to improve overall general health and quality of life.            PT Long Term Goals - 08/03/15 1615    PT LONG TERM GOAL #1   Title After 6 weeks, pt will demonstrate indep in advanced HEP to further progress in funcitonal mobility, and postural correction.    Status New   PT LONG TERM GOAL #2   Title After 6 weeks, pt will demonstrate less than 2/10 pain at night during sleep and throughout day to demonstrate return to PLOF and improve qulaity of life.    Status New   PT LONG TERM GOAL #3   Title After 6 weeks, pt will demonstrate at pain free cervical rotation of 80 degrees bilaterally to improve safety while driving.    Status New               Plan - 09/03/15 0951    Clinical Impression Statement Session focus on improving thoracic mobility initially followed by postural strengthening exercises.  Began prone rows and shoulder extension for postural strenghtening as well as cervical retraction with multimodal cueing for proper form and technique. Overall pt is improving form with exercises and self cueing for posture to reduce forward head and rolled shoulders though does continues to require cueing.  No reports of pain through session.    PT Next Visit Plan Reassess next session.        Problem List Patient Active Problem List   Diagnosis Date Noted  . Eosinophilic  colitis 04/23/2015  . IBD (inflammatory bowel disease)   . Mucosal abnormality of colon   . Diverticulosis of colon with hemorrhage   . Intractable diarrhea   . Leukocytosis   . Acute renal failure (HCC) 03/25/2015  . Diabetes (HCC) 03/25/2015  . Chronic diarrhea of unknown origin 03/25/2015  . Rotator cuff tear 01/04/2013  . S/P rotator cuff repair 01/04/2013  . Arthritis, shoulder region 10/15/2012  . Biceps tendonitis on left 10/15/2012  . Adhesive bursitis of left shoulder 09/01/2012  . Partial tear of rotator cuff(726.13) 09/01/2012  . Pain in joint, shoulder region 07/09/2011  . Muscle weakness (generalized) 07/09/2011  . Bursitis of shoulder, left 07/03/2011   Becky Sax, LPTA; CBIS [phone]  Juel Burrow 09/03/2015, 11:13 AM  [facility] [address] [place], [place], [address] Phone: [phone]   Fax:  [phone]  Name: [name] MRN: [id number] Date of Birth: 02/07/1930

## 2015-09-05 ENCOUNTER — Ambulatory Visit (HOSPITAL_COMMUNITY): Payer: Medicare Other | Attending: Orthopedic Surgery

## 2015-09-05 DIAGNOSIS — M47812 Spondylosis without myelopathy or radiculopathy, cervical region: Secondary | ICD-10-CM | POA: Insufficient documentation

## 2015-09-05 DIAGNOSIS — M4000 Postural kyphosis, site unspecified: Secondary | ICD-10-CM | POA: Diagnosis present

## 2015-09-05 DIAGNOSIS — M6281 Muscle weakness (generalized): Secondary | ICD-10-CM | POA: Insufficient documentation

## 2015-09-05 DIAGNOSIS — M542 Cervicalgia: Secondary | ICD-10-CM | POA: Insufficient documentation

## 2015-09-05 DIAGNOSIS — M2560 Stiffness of unspecified joint, not elsewhere classified: Secondary | ICD-10-CM | POA: Diagnosis present

## 2015-09-05 DIAGNOSIS — R293 Abnormal posture: Secondary | ICD-10-CM

## 2015-09-05 NOTE — Therapy (Addendum)
[place], [place], [address] Phone: [phone]   Fax:  [phone]  Physical Therapy Treatment/ Progress Note   Patient Details  Name: [name] MRN: [id number] Date of Birth: Jan 14, 1930 Referring Provider: Arther Abbott  Encounter Date: 09/05/2015      PT End of Session - 09/05/15 1416    Visit Number 9   Number of Visits 10   Date for PT Re-Evaluation 09/04/15   Authorization Type [employer]   Authorization Time Period 08/03/15-10/02/15   Authorization - Visit Number 9   Authorization - Number of Visits 10   PT Start Time 3825   PT Stop Time 0539   PT Time Calculation (min) 41 min   Activity Tolerance Patient tolerated treatment well;No increased pain   Behavior During Therapy [facility] for tasks assessed/performed      Past Medical History  Diagnosis Date  . Diabetes mellitus   . Hypertension   . Hypercholesterolemia   . GERD (gastroesophageal reflux disease)   . Arthritis   . Eosinophilic colitis     Past Surgical History  Procedure Laterality Date  . Tonsillectomy    . Knee arthroscopy Left   . Shoulder arthroscopy with open rotator cuff repair Left 10/15/2012    Procedure: SHOULDER ARTHROSCOPY WITH OPEN ROTATOR CUFF REPAIR;  Surgeon: Carole Civil, MD;  Location: [facility];  Service: Orthopedics;  Laterality: Left;  open at 0912; with bicep tendon debridement;   . Shoulder acromioplasty Left 10/15/2012    Procedure: SHOULDER ACROMIOPLASTY;  Surgeon: Carole Civil, MD;  Location: [facility];  Service: Orthopedics;  Laterality: Left;  . Resection distal clavical Left 10/15/2012    Procedure: RESECTION DISTAL CLAVICAL;  Surgeon: Carole Civil, MD;  Location: [facility];  Service: Orthopedics;  Laterality: Left;  . Cataract extraction w/phaco Left 03/07/2014    Procedure: CATARACT EXTRACTION PHACO AND INTRAOCULAR LENS PLACEMENT LEFT EYE CDE=7.11;  Surgeon: Elta Guadeloupe T. Gershon Crane, MD;  Location: [facility];  Service: Ophthalmology;  Laterality: Left;  . Cataract extraction w/phaco Right 03/14/2014    Procedure: CATARACT EXTRACTION PHACO AND INTRAOCULAR LENS PLACEMENT RIGHT EYE CDE=10.62;  Surgeon: Elta Guadeloupe T. Gershon Crane, MD;  Location: [facility];  Service: Ophthalmology;  Laterality: Right;  . Colonoscopy  2009    Dr. Oneida Alar: pancolonic diverticulosis, internal hemorrhoids  . Colonoscopy N/A 03/29/2015    RMR: inflammatory changes in the proximal rectum, colon and  terminal ileum suspicious for inflammatory bowel disease. ie. primarily Crohns colitis. Status post biopsy as described. concomitant NSAID exposure could excaerbate pre-existing inflammatory bowel disease but I do not think todays finding are primarly related to recent ibuprofen use.     There were no vitals filed for this visit.  Visit Diagnosis:  Cervical spondylosis without myelopathy  Body posture problem  Kyphosis (acquired) (postural)  Cervicalgia  Stiffness in joint  Muscle weakness (generalized)      Subjective Assessment - 09/05/15 1349    Subjective Pt denied pain upon arrival and noted that his neck pain has ranged between a 0-2/10 on a VAS. Pt noted that his pain levels are less severe since beginning with PT. Spouse reported that the pt has a follow-up appt scheduled with the referring MD for this upcoming Monday, 09/10/15, and will determine at that time if further PT services are warranted. Pt and spouse verbalized interest with possible DC from PT at next visit with transition to an updated HEP.   Patient is accompained by: Family member  How long can you sit comfortably? No limitations with neck pain    How long can you stand comfortably? Unknown    How long can you walk comfortably? No limitations with neck pain    Patient Stated Goals to reduce pain and return to outdoor leisure activities such as gardening    Currently in Pain? No/denies   Pain Score --  Neck pain ranges between a 0-2/10 on a VAS   Pain Location  Neck  L upper trap region    Pain Orientation Left   Pain Descriptors / Indicators Aching   Pain Onset More than a month ago   Pain Frequency Intermittent   Aggravating Factors  occasionally with neck rotation    Pain Relieving Factors sitting in comfortable chair    Effect of Pain on Daily Activities limiting his ability to complete gardening activities   Multiple Pain Sites No            [facility] PT Assessment - 09/05/15 0001    Observation/Other Assessments   Focus on Therapeutic Outcomes (FOTO)  4% limited    Sensation   Light Touch Appears Intact   Posture/Postural Control   Posture/Postural Control Postural limitations   Postural Limitations Rounded Shoulders   AROM   Cervical Extension 27   Cervical - Right Side Bend 24   Cervical - Left Side Bend 18   Cervical - Right Rotation 65   Cervical - Left Rotation 62   Strength   Overall Strength Within functional limits for tasks performed                     [facility] Adult PT Treatment/Exercise - 09/05/15 0001    Neck Exercises: Machines for Strengthening   UBE (Upper Arm Bike) x 4' backward only to improve strength of postural mm   Neck Exercises: Theraband   Scapula Retraction Green;15 reps  x 2 sets   Shoulder Extension Green;15 reps  x 2 sets   Rows Green;15 reps;Other (comment)  x 2 sets   Neck Exercises: Standing   Other Standing Exercises corner stretch x 30" x 5   Neck Exercises: Seated   Neck Retraction 20 reps   Shoulder Rolls Backwards;15 reps   Shoulder Rolls Limitations shoulders up, back and down with therapist facilitaiotn   Other Seated Exercise 3D thoracic excursion 15x   Manual Therapy   Manual Therapy Soft tissue mobilization   Manual therapy comments performed seated at end of session from all otehr skilled interventions    Soft tissue mobilization cervical extensors and upper traps    Neck Exercises: Stretches   Upper Trapezius Stretch 3 reps;30 seconds                 PT Education - 09/05/15 1415    Education provided Yes   Education Details Educated pt and spouse on optimal seated posture and current HEP   Person(s) Educated Patient;Spouse   Methods Explanation;Demonstration;Verbal cues;Tactile cues   Comprehension Verbalized understanding;Returned demonstration;Need further instruction          PT Short Term Goals - 09/05/15 1735    PT SHORT TERM GOAL #1   Title After 2 weeks, pt will demonstrate independence and compliance in an entry level home exercise program.    Status Achieved   PT SHORT TERM GOAL #2   Title After 3 weeks, pt will demonstrate 50% improved cervical rotation and sidebending to improve safety while driving.    Baseline 2/1/17No significant changes measured  with c-spine rotation AROM    Status On-going   PT SHORT TERM GOAL #3   Title After 3 weeks, pt will report less than 4/10 pain at night while sleeping to improve overall general health and quality of life.    Baseline September 18, 2015= pain ranges between a 0-2/10 on a VAS        Status Achieved           PT Long Term Goals - 18-Sep-2015 1737    PT LONG TERM GOAL #1   Title After 6 weeks, pt will demonstrate indep in advanced HEP to further progress in funcitonal mobility, and postural correction.    Status On-going   PT LONG TERM GOAL #2   Title After 6 weeks, pt will demonstrate less than 2/10 pain at night during sleep and throughout day to demonstrate return to PLOF and improve qulaity of life.    Status Achieved   PT LONG TERM GOAL #3   Title After 6 weeks, pt will demonstrate at pain free cervical rotation of 80 degrees bilaterally to improve safety while driving.    Status Not Met               Plan - 09/18/2015 1417    Clinical Impression Statement Mr. Alcalde is an 82 yo male who has been seen for a total 9 PT visits with initial complaints of neck pain. The pt has made steady progress towards stated goals with less severe neck pain, which ranges between a  0-2/10 on a VAS. No significant changes assessed with cervical spine AROM. Pt has progressed towards stated goals and personal goals of experiencing less pain while sleeping and while completing daily activities. He continues to present with limited cervical AROM in all directions with most limitations assessed with SB and rotation. Pt is more aware of his posture, but continues to require cues for optimal seated posture. Today's PT tx was focused on postural re-education, postural strengthening, cervical spine ROM ther ex, and manual therapy techniques to improve tissue extensibility. No palpable tenderness assessed of B UT or cervical musculature. Pt remained pain-free t/o today's PT tx. Pt would benefit from continued skilled PT in order to address remaining deficits including decreased cervical AROM, impaired posture, and soft tissue limitations/impaired flexibility. The pt has achieved 3/6 PT goals. FOTO score improved from 46% limitation to 4% limitation. Will plan to continue with current PT POC; however, pt and spouse verbalized interest in possible DC at next visit. Will plan to further discuss possible DC vs continuation of PT at next visit.    Pt will benefit from skilled therapeutic intervention in order to improve on the following deficits Hypomobility;Decreased strength;Pain;Increased muscle spasms;Improper body mechanics;Impaired flexibility;Postural dysfunction;Decreased range of motion   Rehab Potential Good   PT Frequency 3x / week   PT Duration 4 weeks   PT Treatment/Interventions ADLs/Self Care Home Management;Moist Heat;Traction;Therapeutic exercise;Patient/family education;Manual techniques;Passive range of motion;Ultrasound   PT Next Visit Plan Continue with current POC with focus on postural strengthening, STM/MFR to c-spine, and update HEP. Possible DC at next visit per pt and spouse. Will plan to discuss DC at next PT visit.    PT Home Exercise Plan Reviewed HEP with no changes made  this visit    Recommended Other Services None   Consulted and Agree with Plan of Care Family member/caregiver;Patient          G-Codes - 09-18-15 1738    Functional Assessment Tool Used Clinical Judgement/FOTO  Functional Limitation Changing and maintaining body position   Changing and Maintaining Body Position Current Status [phone]) At least 20 percent but less than 40 percent impaired, limited or restricted   Changing and Maintaining Body Position Goal Status (N1833) At least 20 percent but less than 40 percent impaired, limited or restricted      Problem List Patient Active Problem List   Diagnosis Date Noted  . Eosinophilic colitis 58/25/1898  . IBD (inflammatory bowel disease)   . Mucosal abnormality of colon   . Diverticulosis of colon with hemorrhage   . Intractable diarrhea   . Leukocytosis   . Acute renal failure ([place]) 03/25/2015  . Diabetes ([place]) 03/25/2015  . Chronic diarrhea of unknown origin 03/25/2015  . Rotator cuff tear 01/04/2013  . S/P rotator cuff repair 01/04/2013  . Arthritis, shoulder region 10/15/2012  . Biceps tendonitis on left 10/15/2012  . Adhesive bursitis of left shoulder 09/01/2012  . Partial tear of rotator cuff(726.13) 09/01/2012  . Pain in joint, shoulder region 07/09/2011  . Muscle weakness (generalized) 07/09/2011  . Bursitis of shoulder, left 07/03/2011    Garen Lah, PT, DPT    09/05/2015, 5:48 PM  [place] [address] [place], [place], [address] Phone: [phone]   Fax:  [phone]  Name: [name] MRN: [id number] Date of Birth: 04/22/1930 PHYSICAL THERAPY DISCHARGE SUMMARY  Visits from Start of Care: 9  Current functional level related to goals / functional outcomes: See above Remaining deficits: See above  Education / Equipment: HEP  Plan: Patient agrees to discharge.  Patient goals were partially met. Patient is being discharged due to the physician's request.   ?????       Rayetta Humphrey, [place] CLT [phone]

## 2015-09-10 ENCOUNTER — Ambulatory Visit (HOSPITAL_COMMUNITY): Payer: Medicare Other | Admitting: Physical Therapy

## 2015-09-10 ENCOUNTER — Ambulatory Visit (INDEPENDENT_AMBULATORY_CARE_PROVIDER_SITE_OTHER): Payer: Medicare Other | Admitting: Orthopedic Surgery

## 2015-09-10 VITALS — BP 154/78 | Ht 70.0 in | Wt 194.0 lb

## 2015-09-10 DIAGNOSIS — M47812 Spondylosis without myelopathy or radiculopathy, cervical region: Secondary | ICD-10-CM

## 2015-09-10 MED ORDER — DICLOFENAC POTASSIUM 50 MG PO TABS: 50.0000 mg | ORAL_TABLET | Freq: Two times a day (BID) | ORAL | Status: DC

## 2015-09-10 NOTE — Progress Notes (Signed)
Patient ID: Bach Rocchi, male   DOB: Jul 18, 1930, 82 y.o.   MRN: [id number]  Chief Complaint  Patient presents with  . Follow-up    6 week follow for cervical spondylosis, s/p PT    BP 154/78 mmHg  Ht  (1.778 m)  Wt 194 lb (87.998 kg)  BMI 27.84 kg/m2  Follow-up after physical therapy patient has cervical spondylosis he's had physical therapy discussing him $40 a visit he's has improved with no radicular symptoms or weakness  BP 154/78 mmHg  Ht  (1.778 m)  Wt 194 lb (87.998 kg)  BMI 27.84 kg/m2 He is awake alert and oriented 3 mood and affect are normal he walks with no gait abnormalities his upper extremity strength bilaterally is normal with grip no atrophy is noted. Neurovascular exam intact    ASSESSMENT AND PLAN   Continue diclofenac. Call us back if any problems. He has had an adequate course of nonoperative treatment and if his symptoms worsen he should be able to get an MRI.

## 2015-10-08 ENCOUNTER — Encounter (HOSPITAL_COMMUNITY): Payer: Self-pay

## 2016-03-11 ENCOUNTER — Encounter: Payer: Self-pay | Admitting: Orthopedic Surgery

## 2016-03-11 ENCOUNTER — Ambulatory Visit (INDEPENDENT_AMBULATORY_CARE_PROVIDER_SITE_OTHER): Payer: Medicare Other | Admitting: Orthopedic Surgery

## 2016-03-11 VITALS — BP 130/73 | HR 103 | Ht 70.0 in | Wt 191.6 lb

## 2016-03-11 DIAGNOSIS — M7521 Bicipital tendinitis, right shoulder: Secondary | ICD-10-CM

## 2016-03-11 DIAGNOSIS — M25511 Pain in right shoulder: Secondary | ICD-10-CM

## 2016-03-11 NOTE — Progress Notes (Signed)
Subjective:     Patient ID: [name], male   DOB: [birth year], 82 y.o.   MRN: [id number]  Chief Complaint  Patient presents with  . Follow-up    Right Shoulder Pain   Right shoulder pain for several weeks.  HPI  Reevaluation 82 year old male with right shoulder pain last seen in June 2016.have chronic rotator cuff disease with bone changes including cyst formation of the greater tuberosity. The x-ray showed normal humeral head height and normal glenohumeral joint  Patient complains of pain in the front of the shoulder and pain when resting his arm away from his body   Review of Systems  Musculoskeletal: Positive for arthralgias, neck pain and neck stiffness.  Skin: Negative for color change.  Neurological: Negative for numbness.       Objective:   Physical Exam He appears well-developed and well-nourished grooming and hygiene are normal. He is alert and oriented 3 mood and affect are normal gait and station are unremarkable without a limp  Right shoulder is tender over the anterior joint line no tenderness posteriorly or laterally. He has 150 of active forward elevation with no weakness in his rotator cuff and shoulder stable in abduction external rotation skin is normal pulses are good in the radial artery lymph nodes are negative and sensation is normal in the right arm    Assessment:     The old x-ray shows no glenohumeral arthritis but cyst formation in the rotator cuff area and humeral head. Humeral head height is normal.  Impression Encounter Diagnoses  Name Primary?  . Right shoulder pain Yes  . Biceps tendinitis on right        Plan:     Recommend biceps tendon injection  Respiratory weeks  Follow-up as needed  Injection was done as follows Site confirmed Skin prepped alcohol and ethyl chloride 25-gauge needle injected point of maximal tenderness over the anterior shoulder joint line over the biceps tendon occasions were noted

## 2016-03-11 NOTE — Patient Instructions (Signed)

## 2016-06-30 ENCOUNTER — Ambulatory Visit (INDEPENDENT_AMBULATORY_CARE_PROVIDER_SITE_OTHER): Payer: Medicare Other | Admitting: Orthopedic Surgery

## 2016-06-30 ENCOUNTER — Encounter: Payer: Self-pay | Admitting: Orthopedic Surgery

## 2016-06-30 DIAGNOSIS — G8929 Other chronic pain: Secondary | ICD-10-CM | POA: Diagnosis not present

## 2016-06-30 DIAGNOSIS — M75101 Unspecified rotator cuff tear or rupture of right shoulder, not specified as traumatic: Secondary | ICD-10-CM

## 2016-06-30 DIAGNOSIS — M25511 Pain in right shoulder: Secondary | ICD-10-CM | POA: Diagnosis not present

## 2016-06-30 NOTE — Patient Instructions (Signed)

## 2016-06-30 NOTE — Progress Notes (Signed)
Chief Complaint  Patient presents with  . Follow-up    right shoulder, injection    Procedure note the subacromial injection shoulder RIGHT  Verbal consent was obtained to inject the  RIGHT   Shoulder  Timeout was completed to confirm the injection site is a subacromial space of the  RIGHT  shoulder   Medication used Depo-Medrol 40 mg and lidocaine 1% 3 cc  Anesthesia was provided by ethyl chloride  The injection was performed in the RIGHT  posterior subacromial space. After pinning the skin with alcohol and anesthetized the skin with ethyl chloride the subacromial space was injected using a 20-gauge needle. There were no complications  Sterile dressing was applied.

## 2016-09-16 ENCOUNTER — Other Ambulatory Visit: Payer: Self-pay | Admitting: *Deleted

## 2016-09-16 DIAGNOSIS — M48061 Spinal stenosis, lumbar region without neurogenic claudication: Secondary | ICD-10-CM

## 2016-09-16 MED ORDER — GABAPENTIN 100 MG PO CAPS: 100.0000 mg | ORAL_CAPSULE | Freq: Three times a day (TID) | ORAL | 2 refills | Status: DC

## 2017-01-26 ENCOUNTER — Other Ambulatory Visit: Payer: Self-pay | Admitting: Orthopedic Surgery

## 2017-01-26 DIAGNOSIS — M48061 Spinal stenosis, lumbar region without neurogenic claudication: Secondary | ICD-10-CM

## 2017-05-20 ENCOUNTER — Other Ambulatory Visit: Payer: Self-pay | Admitting: Orthopedic Surgery

## 2017-05-20 DIAGNOSIS — M48061 Spinal stenosis, lumbar region without neurogenic claudication: Secondary | ICD-10-CM

## 2017-07-13 ENCOUNTER — Ambulatory Visit: Payer: Medicare Other | Admitting: Orthopedic Surgery

## 2017-07-21 ENCOUNTER — Encounter: Payer: Self-pay | Admitting: Orthopedic Surgery

## 2017-07-21 ENCOUNTER — Ambulatory Visit: Payer: Medicare Other | Admitting: Orthopedic Surgery

## 2017-07-21 VITALS — BP 154/82 | HR 99 | Ht 67.5 in | Wt 194.0 lb

## 2017-07-21 DIAGNOSIS — M75121 Complete rotator cuff tear or rupture of right shoulder, not specified as traumatic: Secondary | ICD-10-CM

## 2017-07-21 DIAGNOSIS — G8929 Other chronic pain: Secondary | ICD-10-CM | POA: Diagnosis not present

## 2017-07-21 DIAGNOSIS — M25511 Pain in right shoulder: Secondary | ICD-10-CM | POA: Diagnosis not present

## 2017-07-21 NOTE — Progress Notes (Signed)
this is a follow-up visit for pain right shoulder  Chief Complaint  Patient presents with  . Shoulder Pain    right/ states injection did not help much last time     83 year old male with chronic pain in his right shoulder treated with injection and home exercises he has not improved over 6 months.  He reports no trauma.  He feels like something is torn loose in his shoulder he has weakness and decreased range of motion especially with internal rotation and with abduction of his arm  Review of systems he has hearing loss  His exam shows weakness of his supraspinatus tendon grade 5 internal/external rotation strength normal decreased internal rotation to the sacrum passive forward elevation of 150 degrees active forward elevation of 150 degrees as well.  No instability is noted he is neurovascularly intact  Impression x-rays show chronic rotator cuff changes of the bones of the right shoulder without glenohumeral arthritis  Impression rotator cuff tear right shoulder failed nonoperative treatment recommend MRI to evaluate the cuff for tear.

## 2017-07-21 NOTE — Patient Instructions (Signed)
Your MRI has been ordered.  We will contact your insurance company for approval. After the authorization is received the MRI and a return appointment will be scheduled with you by phone.  If you do not hear from us within 5 business days please call [phone] and ask for Darrelle Barrell   Anzley Dibbern will also make your next visit for you.

## 2017-07-29 ENCOUNTER — Ambulatory Visit (HOSPITAL_COMMUNITY)
Admission: RE | Admit: 2017-07-29 | Discharge: 2017-07-29 | Disposition: A | Discharge status: Home / Self Care | Payer: Medicare Other | Source: Ambulatory Visit | Attending: Orthopedic Surgery | Admitting: Orthopedic Surgery

## 2017-07-29 DIAGNOSIS — G8929 Other chronic pain: Secondary | ICD-10-CM | POA: Diagnosis not present

## 2017-07-29 DIAGNOSIS — M25411 Effusion, right shoulder: Secondary | ICD-10-CM | POA: Diagnosis not present

## 2017-07-29 DIAGNOSIS — M25511 Pain in right shoulder: Secondary | ICD-10-CM | POA: Insufficient documentation

## 2017-07-29 DIAGNOSIS — M13811 Other specified arthritis, right shoulder: Secondary | ICD-10-CM | POA: Insufficient documentation

## 2017-07-29 DIAGNOSIS — M75101 Unspecified rotator cuff tear or rupture of right shoulder, not specified as traumatic: Secondary | ICD-10-CM | POA: Diagnosis not present

## 2017-08-07 ENCOUNTER — Telehealth: Payer: Self-pay | Admitting: Radiology

## 2017-08-07 ENCOUNTER — Ambulatory Visit: Payer: Medicare Other | Admitting: Orthopedic Surgery

## 2017-08-07 VITALS — BP 121/72 | HR 73 | Ht 67.5 in | Wt 194.0 lb

## 2017-08-07 DIAGNOSIS — M25511 Pain in right shoulder: Secondary | ICD-10-CM

## 2017-08-07 DIAGNOSIS — M75111 Incomplete rotator cuff tear or rupture of right shoulder, not specified as traumatic: Secondary | ICD-10-CM | POA: Diagnosis not present

## 2017-08-07 DIAGNOSIS — G8929 Other chronic pain: Secondary | ICD-10-CM | POA: Diagnosis not present

## 2017-08-07 NOTE — Progress Notes (Signed)
Chief Complaint  Patient presents with  . Follow-up      MRI results of right shoulder.    Preop for right shoulder   84-year-old male with chronic right shoulder pain had nonoperative treatment did not improve MRI was obtained.  Still complains of dull aching nonradiating severe right shoulder pain and decreased range of motion For several months   Review of systems hard of hearing no chest pain or shortness of breath        Past Surgical History:  Procedure Laterality Date  . CATARACT EXTRACTION W/PHACO Left 03/07/2014    Procedure: CATARACT EXTRACTION PHACO AND INTRAOCULAR LENS PLACEMENT LEFT EYE CDE=7.11;  Surgeon: Mark T. Shapiro, MD;  Location: [facility];  Service: Ophthalmology;  Laterality: Left;  . CATARACT EXTRACTION W/PHACO Right 03/14/2014    Procedure: CATARACT EXTRACTION PHACO AND INTRAOCULAR LENS PLACEMENT RIGHT EYE CDE=10.62;  Surgeon: Mark T. Shapiro, MD;  Location: [facility];  Service: Ophthalmology;  Laterality: Right;  . COLONOSCOPY   2009    Dr. Fields: pancolonic diverticulosis, internal hemorrhoids  . COLONOSCOPY N/A 03/29/2015    RMR: inflammatory changes in the proximal rectum, colon and  terminal ileum suspicious for inflammatory bowel disease. ie. primarily Crohns colitis. Status post biopsy as described. concomitant NSAID exposure could excaerbate pre-existing inflammatory bowel disease but I do not think todays finding are primarly related to recent ibuprofen use.   . KNEE ARTHROSCOPY Left    . RESECTION DISTAL CLAVICAL Left 10/15/2012    Procedure: RESECTION DISTAL CLAVICAL;  Surgeon: Lorrie Strauch E Kaina Orengo, MD;  Location: [facility];  Service: Orthopedics;  Laterality: Left;  . SHOULDER ACROMIOPLASTY Left 10/15/2012    Procedure: SHOULDER ACROMIOPLASTY;  Surgeon: Mercede Rollo E Iva Posten, MD;  Location: [facility];  Service: Orthopedics;  Laterality: Left;  . SHOULDER ARTHROSCOPY WITH OPEN ROTATOR CUFF REPAIR Left 10/15/2012    Procedure: SHOULDER ARTHROSCOPY WITH OPEN ROTATOR CUFF  REPAIR;  Surgeon: Jarika Robben E Sharon Rubis, MD;  Location: [facility];  Service: Orthopedics;  Laterality: Left;  open at 0912; with bicep tendon debridement;   . TONSILLECTOMY        No orders of the defined types were placed in this encounter.         Past Medical History:  Diagnosis Date  . Arthritis    . Diabetes mellitus    . Eosinophilic colitis    . GERD (gastroesophageal reflux disease)    . Hypercholesterolemia    . Hypertension      Physical Exam  Constitutional: He is oriented to person, place, and time. He appears well-developed and well-nourished.  Vital signs have been reviewed and are stable. Gen. appearance the patient is well-developed and well-nourished with normal grooming and hygiene.   HENT:  Head: Normocephalic and atraumatic.  poor hearing  Neck: No JVD present. No tracheal deviation present.  Stiffness in the cervical spine  Musculoskeletal:  GAIT IS steady but he takes short strides and walks with a flexed posture  Lymphadenopathy:    He has no cervical adenopathy.  Neurological: He is alert and oriented to person, place, and time.  Skin: Skin is warm and dry. No erythema.  Psychiatric: He has a normal mood and affect.  Vitals reviewed.     Ortho   His exam right shoulder shows weakness of his supraspinatus tendon grade 5 internal/external rotation strength normal decreased internal rotation to the sacrum passive forward elevation of 150 degrees active forward elevation of 150 degrees as well.  No instability is noted he is   neurovascularly intact     Left shoulder range of motion is normal strength is normal stability is normal muscle tone is normal alignment is normal neurovascular exam is normal stability tests are normal     Impression x-rays show chronic rotator cuff changes of the bones of the right shoulder without glenohumeral arthritis     MPRESSION: 1. Partial-thickness bursal surface tear of the anterior aspect of the distal supraspinatus  tendon. 2. Moderate AC joint arthropathy with a joint effusion.     Electronically Signed   By: James  Maxwell M.D.   On: 07/29/2017 16:25   FINDINGS: Rotator cuff: There is a 12 mm partial-thickness bursal surface tear of the distal supraspinatus tendon extending 75% of the way through the tendon. There is a small intrasubstance tear at the musculotendinous junction of the infraspinatus. Teres minor and subscapularis tendons are intact.     Open right rotator cuff repair and AC joint resection   The procedure has been fully reviewed with the patient; The risks and benefits of surgery have been discussed and explained and understood. Alternative treatment has also been reviewed, questions were encouraged and answered. The postoperative plan is also been reviewed.   

## 2017-08-07 NOTE — Telephone Encounter (Signed)
I called to get authorization for surgery [phone] and [id number] dx code M75.111 [facility]  ref # for call [phone]

## 2017-08-07 NOTE — Addendum Note (Signed)
Addended by: Fuller CanadaHARRISON, STANLEY E on: 08/07/2017 12:19 PM   Modules accepted: Orders, SmartSet

## 2017-08-07 NOTE — Patient Instructions (Signed)
You have decided to proceed with rotator cuff repair surgery. You have decided not to continue with nonoperative measures such as but not limited to oral medication,   activity modification, physical therapy, or injection.  We will perform a rotator cuff repair. Some of the risks associated with rotator cuff repair include but are not limited to Bleeding Infection Swelling Stiffness Blood clot Pain Re-tearing of the rotator cuff Failure of the rotator cuff to heal   If you're not comfortable with these risks and would like to continue with nonoperative treatment please let Dr. Harrison know prior to your surgery.  

## 2017-08-12 NOTE — Patient Instructions (Signed)
[name]  08/12/2017     @PREFPERIOPPHARMACY @   Your procedure is scheduled on  08/20/2017  Report to [facility] at  615   A.M.  Call this number if you have problems the morning of surgery:  [phone]   Remember:  Do not eat food or drink liquids after midnight.  Take these medicines the morning of surgery with A SIP OF WATER  Entocort, aricept, neurontin, protonix, diclofenac. Take 18 units of your Novolog the nigh before your surgery. DO NOT take any medication for diabetes the morning of the surgery.  Do not wear jewelry, make-up or nail polish.  Do not wear lotions, powders, or perfumes, or deodorant.  Do not shave 48 hours prior to surgery.  Men may shave face and neck.  Do not bring valuables to the hospital.  [facility] is not responsible for any belongings or valuables.  Contacts, dentures or bridgework may not be worn into surgery.  Leave your suitcase in the car.  After surgery it may be brought to your room.  For patients admitted to the hospital, discharge time will be determined by your treatment team.  Patients discharged the day of surgery will not be allowed to drive home.   Name and phone number of your driver:   family Special instructions:  None  Please read over the following fact sheets that you were given. Anesthesia Post-op Instructions and Care and Recovery After Surgery       Surgery for Rotator Cuff Tear, Care After Refer to this sheet in the next few weeks. These instructions provide you with information about caring for yourself after your procedure. Your health care provider may also give you more specific instructions. Your treatment has been planned according to current medical practices, but problems sometimes occur. Call your health care provider if you have any problems or questions after your procedure. What can I expect after the procedure? After the procedure, it is common to  have:  Swelling.  Pain.  Stiffness.  Tenderness.  Follow these instructions at home: If you have a sling:  Wear the sling as told by your health care provider. Remove it only as told by your health care provider.  Loosen the sling if your fingers tingle, become numb, or turn cold and blue.  Do not let your sling get wet if it is not waterproof.  Keep the sling clean. Bathing  Do not take baths, swim, or use a hot tub until your health care provider approves. Ask your health care provider if you can take showers. You may only be allowed to take sponge baths for bathing.  Keep your bandage (dressing) dry until your health care provider says it can be removed. Incision care  Follow instructions from your health care provider about how to take care of your incision. Make sure you: ? Wash your hands with soap and water before you change your dressing. If soap and water are not available, use hand sanitizer. ? Change your dressing as told by your health care provider. ? Leave stitches (sutures), skin glue, or adhesive strips in place. These skin closures may need to stay in place for 2 weeks or longer. If adhesive strip edges start to loosen and curl up, you may trim the loose edges. Do not remove adhesive strips completely unless your health care provider tells you to do that.  Check your incision area every day for signs of  infection. Check for: ? More redness, swelling, or pain. ? More fluid or blood. ? Warmth. ? Pus or a bad smell. Managing pain, stiffness, and swelling   If directed, put ice on your shoulder area. ? Put ice in a plastic bag. ? Place a towel between your skin and the bag. ? Leave the ice on for 20 minutes, 2-3 times a day.  Move your fingers often to avoid stiffness and to lessen swelling.  Raise (elevate) your upper body on pillows when you lie down and when you sleep. ? Do not sleep on the front of your body (abdomen). ? Do not sleep on the side that  your surgery was performed on. Driving  Do not drive for 24 hours if you received a medicine to help you relax (sedative) during your procedure.  Do not drive or operate heavy machinery while taking prescription pain medicine.  Ask your health care provider when it is safe for you to drive. Activity  Do not use your arm to support your body weight until your health care provider approves.  Do not lift or hold anything with your arm until your health care provider approves.  Return to your normal activities as told by your health care provider. Ask your health care provider what activities are safe for you.  Do exercises as told by your health care provider. General instructions   Do not use any tobacco products, such as cigarettes, chewing tobacco, or e-cigarettes. Tobacco can delay healing. If you need help quitting, ask your health care provider.  Take over-the-counter and prescription medicines only as told by your health care provider.  If you were prescribed an antibiotic medicine, take it as told by your health care provider. Do not stop taking the antibiotic even if you start to feel better.  Keep all follow-up visits as told by your health care provider. This is important. Contact a health care provider if:  You have a fever.  You have more redness, swelling, or pain around your incision.  You have more fluid or blood coming from your incision.  Your incision feels warm to the touch.  You have pus or a bad smell coming from your incision.  You have pain that gets worse or does not get better with medicine. Get help right away if:  You have severe pain.  You lose feeling in your arm or hand.  Your hand or fingers turn very pale or blue. This information is not intended to replace advice given to you by your health care provider. Make sure you discuss any questions you have with your health care provider. Document Released: 07/21/2005 Document Revised: 03/26/2016  Document Reviewed: 08/04/2015 Elsevier Interactive Patient Education  2018 [employer].  Surgery for Rotator Cuff Tear The rotator cuff is a group of muscles and connective tissues (tendons) that surround the shoulder joint and keep the upper arm bone (humerus) in the shoulder socket. A tendon is the place on a muscle where it attaches to a bone. Surgery may be done to repair a partial or complete tear in the rotator cuff that cannot be treated by nonsurgical methods. The exact procedure that you have depends on your injury. If you have a partial tear, you may have surgery to reattach a tendon to the humerus. If you have a complete tear, you may have surgery to sew the two sides of the tear back together. Surgery may be done through small incisions using an operating telescope (arthroscope), through a larger (  open) incision, or through a combination of both. Tell a health care provider about:  Any allergies you have.  All medicines you are taking, including vitamins, herbs, eye drops, creams, and over-the-counter medicines.  Any problems you or family members have had with anesthetic medicines.  Any blood disorders you have.  Any surgeries you have had.  Any medical conditions you have.  Whether you are pregnant or may be pregnant. What are the risks? Generally, this is a safe procedure. However, problems may occur, including:  Infection.  Bleeding.  Allergic reactions to medicines or materials used during the procedure.  Damage to nerves, blood vessels, or shoulder muscles.  Permanent loss of full shoulder movement (stiffness).  What happens before the procedure? Staying hydrated Follow instructions from your health care provider about hydration, which may include:  Up to 2 hours before the procedure - you may continue to drink clear liquids, such as water, clear fruit juice, black coffee, and plain tea.  Eating and drinking restrictions Follow instructions from your  health care provider about eating and drinking, which may include:  8 hours before the procedure - stop eating heavy meals or foods such as meat, fried foods, or fatty foods.  6 hours before the procedure - stop eating light meals or foods, such as toast or cereal.  6 hours before the procedure - stop drinking milk or drinks that contain milk.  2 hours before the procedure - stop drinking clear liquids.  Other instructions  Ask your health care provider about: ? Changing or stopping your regular medicines. This is especially important if you are taking diabetes medicines or blood thinners. ? Taking medicines such as aspirin and ibuprofen. These medicines can thin your blood. Do not take these medicines before your procedure if your health care provider instructs you not to.  Plan to have someone take you home from the hospital or clinic.  If you will be going home right after the procedure, plan to have someone with you for 24 hours.  Ask your health care provider how your surgical site will be marked or identified.  You may be given antibiotic medicine to help prevent infection. What happens during the procedure?  To reduce your risk of infection: ? Your health care team will wash or sanitize their hands. ? Your skin will be washed with soap.  An IV tube will be inserted into one of your veins.  You will be given one or more of the following: ? A medicine to help you relax (sedative). ? A medicine to make you fall asleep (general anesthetic). ? A medicine that is injected into an area of your body to numb everything beyond the injection site (regional anesthetic).  Your surgeon will move your shoulder to observe your injury.  If you are having arthroscopic surgery: ? Small incisions will be made in the front and back of your shoulder. ? An arthroscope will be inserted through these incisions to examine the inside of your shoulder and plan the surgery.  If you are having  open surgery, a wider incision will be made in your shoulder.  Some of the muscle covering your shoulder (deltoid) may be moved to expose your rotator cuff.  Bony growths that might interfere with healing will be removed.  Your rotator cuff will be trimmed around the area where it has torn away from your humerus.  If your rotator cuff is completely torn, the split ends will be sewn back together.  Anchoring inserts will  be placed into your humerus in the area where the tendon has torn away from the bone.  The torn end of your rotator cuff will be re-attached (anchored) to your humerus using stitches and small screws.  Your incisions will be closed with sutures.  The incision in your skin will be covered with a bandage (dressing) and medicine.  Your arm will be placed in a sling. The procedure may vary among health care providers and hospitals. What happens after the procedure?  Your blood pressure, heart rate, breathing rate, and blood oxygen level will be monitored often until the medicines you were given have worn off.  You may have some pain. Medicines will be available to help you.  Do not drive for 24 hours if you received a sedative, or until your health care provider approves. This information is not intended to replace advice given to you by your health care provider. Make sure you discuss any questions you have with your health care provider. Document Released: 08/04/2015 Document Revised: 12/27/2015 Document Reviewed: 08/04/2015 Elsevier Interactive Patient Education  2018 [employer].  Surgery for Rotator Cuff Tear The rotator cuff is a group of muscles and connective tissues (tendons) that surround the shoulder joint and keep the upper arm bone (humerus) in the shoulder socket. A tendon is the place on a muscle where it attaches to a bone. Surgery may be done to repair a partial or complete tear in the rotator cuff that cannot be treated by nonsurgical methods. The  exact procedure that you have depends on your injury. If you have a partial tear, you may have surgery to reattach a tendon to the humerus. If you have a complete tear, you may have surgery to sew the two sides of the tear back together. Surgery may be done through small incisions using an operating telescope (arthroscope), through a larger (open) incision, or through a combination of both. Tell a health care provider about:  Any allergies you have.  All medicines you are taking, including vitamins, herbs, eye drops, creams, and over-the-counter medicines.  Any problems you or family members have had with anesthetic medicines.  Any blood disorders you have.  Any surgeries you have had.  Any medical conditions you have.  Whether you are pregnant or may be pregnant. What are the risks? Generally, this is a safe procedure. However, problems may occur, including:  Infection.  Bleeding.  Allergic reactions to medicines or materials used during the procedure.  Damage to nerves, blood vessels, or shoulder muscles.  Permanent loss of full shoulder movement (stiffness).  What happens before the procedure? Staying hydrated Follow instructions from your health care provider about hydration, which may include:  Up to 2 hours before the procedure - you may continue to drink clear liquids, such as water, clear fruit juice, black coffee, and plain tea.  Eating and drinking restrictions Follow instructions from your health care provider about eating and drinking, which may include:  8 hours before the procedure - stop eating heavy meals or foods such as meat, fried foods, or fatty foods.  6 hours before the procedure - stop eating light meals or foods, such as toast or cereal.  6 hours before the procedure - stop drinking milk or drinks that contain milk.  2 hours before the procedure - stop drinking clear liquids.  Other instructions  Ask your health care provider about: ? Changing  or stopping your regular medicines. This is especially important if you are taking diabetes medicines or  blood thinners. ? Taking medicines such as aspirin and ibuprofen. These medicines can thin your blood. Do not take these medicines before your procedure if your health care provider instructs you not to.  Plan to have someone take you home from the hospital or clinic.  If you will be going home right after the procedure, plan to have someone with you for 24 hours.  Ask your health care provider how your surgical site will be marked or identified.  You may be given antibiotic medicine to help prevent infection. What happens during the procedure?  To reduce your risk of infection: ? Your health care team will wash or sanitize their hands. ? Your skin will be washed with soap.  An IV tube will be inserted into one of your veins.  You will be given one or more of the following: ? A medicine to help you relax (sedative). ? A medicine to make you fall asleep (general anesthetic). ? A medicine that is injected into an area of your body to numb everything beyond the injection site (regional anesthetic).  Your surgeon will move your shoulder to observe your injury.  If you are having arthroscopic surgery: ? Small incisions will be made in the front and back of your shoulder. ? An arthroscope will be inserted through these incisions to examine the inside of your shoulder and plan the surgery.  If you are having open surgery, a wider incision will be made in your shoulder.  Some of the muscle covering your shoulder (deltoid) may be moved to expose your rotator cuff.  Bony growths that might interfere with healing will be removed.  Your rotator cuff will be trimmed around the area where it has torn away from your humerus.  If your rotator cuff is completely torn, the split ends will be sewn back together.  Anchoring inserts will be placed into your humerus in the area where the tendon  has torn away from the bone.  The torn end of your rotator cuff will be re-attached (anchored) to your humerus using stitches and small screws.  Your incisions will be closed with sutures.  The incision in your skin will be covered with a bandage (dressing) and medicine.  Your arm will be placed in a sling. The procedure may vary among health care providers and hospitals. What happens after the procedure?  Your blood pressure, heart rate, breathing rate, and blood oxygen level will be monitored often until the medicines you were given have worn off.  You may have some pain. Medicines will be available to help you.  Do not drive for 24 hours if you received a sedative, or until your health care provider approves. This information is not intended to replace advice given to you by your health care provider. Make sure you discuss any questions you have with your health care provider. Document Released: 08/04/2015 Document Revised: 12/27/2015 Document Reviewed: 08/04/2015 Elsevier Interactive Patient Education  2018 [employer].  General Anesthesia, Adult General anesthesia is the use of medicines to make a person "go to sleep" (be unconscious) for a medical procedure. General anesthesia is often recommended when a procedure:  Is long.  Requires you to be still or in an unusual position.  Is major and can cause you to lose blood.  Is impossible to do without general anesthesia.  The medicines used for general anesthesia are called general anesthetics. In addition to making you sleep, the medicines:  Prevent pain.  Control your blood pressure.  Relax  your muscles.  Tell a health care provider about:  Any allergies you have.  All medicines you are taking, including vitamins, herbs, eye drops, creams, and over-the-counter medicines.  Any problems you or family members have had with anesthetic medicines.  Types of anesthetics you have had in the past.  Any bleeding  disorders you have.  Any surgeries you have had.  Any medical conditions you have.  Any history of heart or lung conditions, such as heart failure, sleep apnea, or chronic obstructive pulmonary disease (COPD).  Whether you are pregnant or may be pregnant.  Whether you use tobacco, alcohol, marijuana, or street drugs.  Any history of Financial [employer].  Any history of depression or anxiety. What are the risks? Generally, this is a safe procedure. However, problems may occur, including:  Allergic reaction to anesthetics.  Lung and heart problems.  Inhaling food or liquids from your stomach into your lungs (aspiration).  Injury to nerves.  Waking up during your procedure and being unable to move (rare).  Extreme agitation or a state of mental confusion (delirium) when you wake up from the anesthetic.  Air in the bloodstream, which can lead to stroke.  These problems are more likely to develop if you are having a major surgery or if you have an advanced medical condition. You can prevent some of these complications by answering all of your health care provider's questions thoroughly and by following all pre-procedure instructions. General anesthesia can cause side effects, including:  Nausea or vomiting  A sore throat from the breathing tube.  Feeling cold or shivery.  Feeling tired, washed out, or achy.  Sleepiness or drowsiness.  Confusion or agitation.  What happens before the procedure? Staying hydrated Follow instructions from your health care provider about hydration, which may include:  Up to 2 hours before the procedure - you may continue to drink clear liquids, such as water, clear fruit juice, black coffee, and plain tea.  Eating and drinking restrictions Follow instructions from your health care provider about eating and drinking, which may include:  8 hours before the procedure - stop eating heavy meals or foods such as meat, fried foods, or fatty  foods.  6 hours before the procedure - stop eating light meals or foods, such as toast or cereal.  6 hours before the procedure - stop drinking milk or drinks that contain milk.  2 hours before the procedure - stop drinking clear liquids.  Medicines  Ask your health care provider about: ? Changing or stopping your regular medicines. This is especially important if you are taking diabetes medicines or blood thinners. ? Taking medicines such as aspirin and ibuprofen. These medicines can thin your blood. Do not take these medicines before your procedure if your health care provider instructs you not to. ? Taking new dietary supplements or medicines. Do not take these during the week before your procedure unless your health care provider approves them.  If you are told to take a medicine or to continue taking a medicine on the day of the procedure, take the medicine with sips of water. General instructions   Ask if you will be going home the same day, the following day, or after a longer hospital stay. ? Plan to have someone take you home. ? Plan to have someone stay with you for the first 24 hours after you leave the hospital or clinic.  For 3-6 weeks before the procedure, try not to use any tobacco products, such as cigarettes, chewing  tobacco, and e-cigarettes.  You may brush your teeth on the morning of the procedure, but make sure to spit out the toothpaste. What happens during the procedure?  You will be given anesthetics through a mask and through an IV tube in one of your veins.  You may receive medicine to help you relax (sedative).  As soon as you are asleep, a breathing tube may be used to help you breathe.  An anesthesia specialist will stay with you throughout the procedure. He or she will help keep you comfortable and safe by continuing to give you medicines and adjusting the amount of medicine that you get. He or she will also watch your blood pressure, pulse, and oxygen  levels to make sure that the anesthetics do not cause any problems.  If a breathing tube was used to help you breathe, it will be removed before you wake up. The procedure may vary among health care providers and hospitals. What happens after the procedure?  You will wake up, often slowly, after the procedure is complete, usually in a recovery area.  Your blood pressure, heart rate, breathing rate, and blood oxygen level will be monitored until the medicines you were given have worn off.  You may be given medicine to help you calm down if you feel anxious or agitated.  If you will be going home the same day, your health care provider may check to make sure you can stand, drink, and urinate.  Your health care providers will treat your pain and side effects before you go home.  Do not drive for 24 hours if you received a sedative.  You may: ? Feel nauseous and vomit. ? Have a sore throat. ? Have mental slowness. ? Feel cold or shivery. ? Feel sleepy. ? Feel tired. ? Feel sore or achy, even in parts of your body where you did not have surgery. This information is not intended to replace advice given to you by your health care provider. Make sure you discuss any questions you have with your health care provider. Document Released: 10/28/2007 Document Revised: 01/01/2016 Document Reviewed: 07/05/2015 Elsevier Interactive Patient Education  2018 [employer]. General Anesthesia, Adult, Care After These instructions provide you with information about caring for yourself after your procedure. Your health care provider may also give you more specific instructions. Your treatment has been planned according to current medical practices, but problems sometimes occur. Call your health care provider if you have any problems or questions after your procedure. What can I expect after the procedure? After the procedure, it is common to have:  Vomiting.  A sore throat.  Mental slowness.  It is  common to feel:  Nauseous.  Cold or shivery.  Sleepy.  Tired.  Sore or achy, even in parts of your body where you did not have surgery.  Follow these instructions at home: For at least 24 hours after the procedure:  Do not: ? Participate in activities where you could fall or become injured. ? Drive. ? Use heavy machinery. ? Drink alcohol. ? Take sleeping pills or medicines that cause drowsiness. ? Make important decisions or sign legal documents. ? Take care of children on your own.  Rest. Eating and drinking  If you vomit, drink water, juice, or soup when you can drink without vomiting.  Drink enough fluid to keep your urine clear or pale yellow.  Make sure you have little or no nausea before eating solid foods.  Follow the diet recommended by your  health care provider. General instructions  Have a responsible adult stay with you until you are awake and alert.  Return to your normal activities as told by your health care provider. Ask your health care provider what activities are safe for you.  Take over-the-counter and prescription medicines only as told by your health care provider.  If you smoke, do not smoke without supervision.  Keep all follow-up visits as told by your health care provider. This is important. Contact a health care provider if:  You continue to have nausea or vomiting at home, and medicines are not helpful.  You cannot drink fluids or start eating again.  You cannot urinate after 8-12 hours.  You develop a skin rash.  You have fever.  You have increasing redness at the site of your procedure. Get help right away if:  You have difficulty breathing.  You have chest pain.  You have unexpected bleeding.  You feel that you are having a life-threatening or urgent problem. This information is not intended to replace advice given to you by your health care provider. Make sure you discuss any questions you have with your health care  provider. Document Released: 10/27/2000 Document Revised: 12/24/2015 Document Reviewed: 07/05/2015 Elsevier Interactive Patient Education  [employer]2018 Elsevier Inc.

## 2017-08-17 ENCOUNTER — Encounter (HOSPITAL_COMMUNITY)
Admission: RE | Admit: 2017-08-17 | Discharge: 2017-08-17 | Disposition: A | Discharge status: Home / Self Care | Payer: Medicare Other | Source: Ambulatory Visit | Attending: Orthopedic Surgery | Admitting: Orthopedic Surgery

## 2017-08-17 ENCOUNTER — Encounter (HOSPITAL_COMMUNITY): Payer: Self-pay

## 2017-08-17 ENCOUNTER — Other Ambulatory Visit: Payer: Self-pay

## 2017-08-17 ENCOUNTER — Ambulatory Visit (INDEPENDENT_AMBULATORY_CARE_PROVIDER_SITE_OTHER): Payer: Medicare Other | Admitting: Cardiovascular Disease

## 2017-08-17 ENCOUNTER — Telehealth: Payer: Self-pay | Admitting: Orthopedic Surgery

## 2017-08-17 ENCOUNTER — Encounter: Payer: Self-pay | Admitting: Cardiovascular Disease

## 2017-08-17 VITALS — BP 132/77 | HR 80 | Ht 70.5 in | Wt 191.8 lb

## 2017-08-17 DIAGNOSIS — F0390 Unspecified dementia without behavioral disturbance: Secondary | ICD-10-CM | POA: Diagnosis not present

## 2017-08-17 DIAGNOSIS — M75101 Unspecified rotator cuff tear or rupture of right shoulder, not specified as traumatic: Secondary | ICD-10-CM | POA: Diagnosis present

## 2017-08-17 DIAGNOSIS — R9431 Abnormal electrocardiogram [ECG] [EKG]: Secondary | ICD-10-CM

## 2017-08-17 DIAGNOSIS — G8929 Other chronic pain: Secondary | ICD-10-CM | POA: Diagnosis not present

## 2017-08-17 DIAGNOSIS — I10 Essential (primary) hypertension: Secondary | ICD-10-CM | POA: Diagnosis not present

## 2017-08-17 DIAGNOSIS — Z794 Long term (current) use of insulin: Secondary | ICD-10-CM

## 2017-08-17 DIAGNOSIS — M75111 Incomplete rotator cuff tear or rupture of right shoulder, not specified as traumatic: Secondary | ICD-10-CM | POA: Diagnosis not present

## 2017-08-17 DIAGNOSIS — Z7984 Long term (current) use of oral hypoglycemic drugs: Secondary | ICD-10-CM | POA: Diagnosis not present

## 2017-08-17 DIAGNOSIS — E119 Type 2 diabetes mellitus without complications: Secondary | ICD-10-CM

## 2017-08-17 DIAGNOSIS — M19011 Primary osteoarthritis, right shoulder: Secondary | ICD-10-CM | POA: Diagnosis not present

## 2017-08-17 DIAGNOSIS — E785 Hyperlipidemia, unspecified: Secondary | ICD-10-CM

## 2017-08-17 DIAGNOSIS — Z87891 Personal history of nicotine dependence: Secondary | ICD-10-CM | POA: Diagnosis not present

## 2017-08-17 DIAGNOSIS — I452 Bifascicular block: Secondary | ICD-10-CM | POA: Diagnosis not present

## 2017-08-17 HISTORY — DX: Unspecified hearing loss, unspecified ear: H91.90

## 2017-08-17 HISTORY — DX: Unspecified dementia, unspecified severity, without behavioral disturbance, psychotic disturbance, mood disturbance, and anxiety: F03.90

## 2017-08-17 LAB — BASIC METABOLIC PANEL
Anion gap: 12 (ref 5–15)
BUN: 13 mg/dL (ref 6–20)
CHLORIDE: 102 mmol/L (ref 101–111)
CO2: 23 mmol/L (ref 22–32)
CREATININE: 1.04 mg/dL (ref 0.61–1.24)
Calcium: 9.6 mg/dL (ref 8.9–10.3)
Glucose, Bld: 108 mg/dL — ABNORMAL HIGH (ref 65–99)
Potassium: 4.2 mmol/L (ref 3.5–5.1)
SODIUM: 137 mmol/L (ref 135–145)

## 2017-08-17 LAB — CBC WITH DIFFERENTIAL/PLATELET
BASOS ABS: 0 10*3/uL (ref 0.0–0.1)
Basophils Relative: 1 %
EOS ABS: 0.1 10*3/uL (ref 0.0–0.7)
EOS PCT: 1 %
HCT: 39 % (ref 39.0–52.0)
HEMOGLOBIN: 12.4 g/dL — AB (ref 13.0–17.0)
LYMPHS ABS: 1.6 10*3/uL (ref 0.7–4.0)
Lymphocytes Relative: 29 %
MCH: 26.6 pg (ref 26.0–34.0)
MCHC: 31.8 g/dL (ref 30.0–36.0)
MCV: 83.5 fL (ref 78.0–100.0)
Monocytes Absolute: 0.5 10*3/uL (ref 0.1–1.0)
Monocytes Relative: 10 %
NEUTROS PCT: 59 %
Neutro Abs: 3.3 10*3/uL (ref 1.7–7.7)
Platelets: 149 10*3/uL — ABNORMAL LOW (ref 150–400)
RBC: 4.67 MIL/uL (ref 4.22–5.81)
RDW: 14.7 % (ref 11.5–15.5)
WBC: 5.5 10*3/uL (ref 4.0–10.5)

## 2017-08-17 LAB — HEMOGLOBIN A1C
HEMOGLOBIN A1C: 6.6 % — AB (ref 4.8–5.6)
Mean Plasma Glucose: 142.72 mg/dL

## 2017-08-17 LAB — GLUCOSE, CAPILLARY: GLUCOSE-CAPILLARY: 109 mg/dL — AB (ref 65–99)

## 2017-08-17 NOTE — Telephone Encounter (Signed)
Thank you I saw this and looks like he is okay to go

## 2017-08-17 NOTE — Pre-Procedure Instructions (Signed)
Patient in for PAT. Has had EKG changes since last EKG 2015. Dr Jayme CloudGonzalez consulted and he thinks patient need Cardiac clearance. Cardiology was able to see patient now. Sent for consult.

## 2017-08-17 NOTE — Telephone Encounter (Signed)
To you FYI Pt had EKG with changes noted Cardiology has seen him  ASSESSMENT AND PLAN:  1.  Abnormal ECG/preoperative risk stratification: Given the patient's age and other comorbidities including insulin-dependent diabetes mellitus, hypertension, and hyperlipidemia, there is certainly some degree of cardiovascular risk with orthopedic surgery.  However, he is asymptomatic from a cardiovascular standpoint.  The ECG as noted above does not truly demonstrate inferior infarct but rather right bundle branch block and left anterior fascicular block.  I do not think his perioperative risk is prohibitive and would recommend he proceed with surgery as planned.  As he is on statin therapy, it will help in some degree to attenuate this risk based on its pleiotropic effects.

## 2017-08-17 NOTE — Telephone Encounter (Signed)
Voice Message received via voice mail from [facility], Selena BattenKim, regarding patient's EKG results, in reference to surgery scheduled 08/20/17. Please call [phone]

## 2017-08-17 NOTE — Patient Instructions (Signed)
Your physician recommends that you schedule a follow-up appointment in:  As needed with Dr.Koneswaran   I will forward Dr.Koneswaran's note to Dr Romeo AppleHarrison   Thank you for choosing [facility] !

## 2017-08-17 NOTE — Telephone Encounter (Signed)
I have advised [facility] you did see notes, and thanked them for calling.

## 2017-08-17 NOTE — Pre-Procedure Instructions (Signed)
Dr Romeo AppleHarrison notified of cardiology consult.

## 2017-08-17 NOTE — Progress Notes (Signed)
CARDIOLOGY CONSULT NOTE  Patient ID: [name] MRN: [id number] DOB/AGE: July 06, 1930 84 y.o.  Admit date: (Not on file) Primary Physician: Pearson Grippe, MD Referring Physician: Dr. Selena Batten  Reason for Consultation: Abnormal ECG  HPI: [name] is a 84 y.o. male who is being seen today for the evaluation of abnormal ECG at the request of Pearson Grippe, MD.   Past medical history includes insulin-dependent diabetes mellitus, hypertension, and hyperlipidemia..  It appears he was in the preoperative area of the hospital today to undergo blood tests in preparation for surgery on 08/20/17.  He has been scheduled to undergo right rotator cuff repair and AC joint resection.  An ECG was performed and found to be abnormal and he was thus brought over to our office today.  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  The computer automated reading mentions "cannot rule out inferior infarct ".  However, there are so-called "micro-R waves "in leads III and likely aVF as well.  There does not actually appear to be true inferior infarct pattern.  I compared this to an ECG performed on March 01, 2014.  At that time it demonstrated sinus rhythm with right bundle branch block with an axis of -23 degrees.  Today's axis is -53 degrees.  Left anterior fascicular block was not present at that time.  I do not have a copy of recent labs.  The most recent ones in the electronic medical record were performed in August 2016.  The patient denies any symptoms of chest pain, palpitations, shortness of breath, lightheadedness, dizziness, leg swelling, orthopnea, PND, and syncope.  He has a hearing aid in his left ear and is also hard of hearing in his right ear.  This has led to some balance issues while walking.  He denies syncope and falls.  His only other complaints relate to significant right shoulder  pain.   No Known Allergies  Current Outpatient Medications  Medication Sig Dispense Refill  . amLODipine (NORVASC) 2.5 MG tablet Take 2.5 mg by mouth daily.    Marland Kitchen aspirin EC 81 MG tablet Take 81 mg by mouth daily.    Marland Kitchen atorvastatin (LIPITOR) 40 MG tablet Take 40 mg by mouth at bedtime.    . budesonide (ENTOCORT EC) 3 MG 24 hr capsule Take 3 mg by mouth daily.     . cholecalciferol (VITAMIN D) 1000 units tablet Take 1,000 Units by mouth daily.    Marland Kitchen Cod Liver Oil 1000 MG CAPS Take 1 capsule by mouth daily.    . diclofenac (CATAFLAM) 50 MG tablet Take 1 tablet (50 mg total) by mouth 2 (two) times daily. 84 tablet 0  . donepezil (ARICEPT) 10 MG tablet Take 10 mg by mouth at bedtime.   1  . fish oil-omega-3 fatty acids 1000 MG capsule Take 1 g by mouth 2 (two) times daily.     Marland Kitchen gabapentin (NEURONTIN) 100 MG capsule Take 1 capsule (100 mg total) by mouth 3 (three) times daily. 90 capsule 2  . ibuprofen (ADVIL,MOTRIN) 800 MG tablet Take 1 tablet (800 mg total) by mouth 3 (three) times daily. (Patient taking differently: Take 800 mg by mouth every 8 (eight) hours as needed for mild pain or moderate pain. ) 90 tablet 0  . insulin aspart (NOVOLOG) 100 UNIT/ML injection Inject 20-36 Units into the skin See admin instructions. 20 units at lunch time, and 36 units  at bedtime    . insulin detemir (LEVEMIR) 100 UNIT/ML injection Inject 24-55 Units into the skin 2 (two) times daily. 55 units each morning, and 24 units at dinner    . metFORMIN (GLUCOPHAGE) 1000 MG tablet Take 1,000 mg by mouth 2 (two) times daily with a meal.      . Multiple Vitamin (MULTIVITAMIN WITH MINERALS) TABS tablet Take 1 tablet by mouth daily.    . niacin (NIASPAN) 500 MG CR tablet Take 500 mg by mouth at bedtime.    . ONGLYZA 5 MG TABS tablet Take 5 mg by mouth every morning.     . pantoprazole (PROTONIX) 40 MG tablet Take 1 tablet (40 mg total) by mouth daily. 30 tablet 2   No current facility-administered medications for this  visit.     Past Medical History:  Diagnosis Date  . Arthritis   . Dementia    early stages  . Diabetes mellitus   . Eosinophilic colitis   . GERD (gastroesophageal reflux disease)   . HOH (hard of hearing)   . Hypercholesterolemia   . Hypertension     Past Surgical History:  Procedure Laterality Date  . CATARACT EXTRACTION W/PHACO Left 03/07/2014   Procedure: CATARACT EXTRACTION PHACO AND INTRAOCULAR LENS PLACEMENT LEFT EYE CDE=7.11;  Surgeon: Loraine Leriche T. Nile Riggs, MD;  Location: [facility];  Service: Ophthalmology;  Laterality: Left;  . CATARACT EXTRACTION W/PHACO Right 03/14/2014   Procedure: CATARACT EXTRACTION PHACO AND INTRAOCULAR LENS PLACEMENT RIGHT EYE CDE=10.62;  Surgeon: Loraine Leriche T. Nile Riggs, MD;  Location: [facility];  Service: Ophthalmology;  Laterality: Right;  . COLONOSCOPY  2009   Dr. Darrick Penna: pancolonic diverticulosis, internal hemorrhoids  . COLONOSCOPY N/A 03/29/2015   RMR: inflammatory changes in the proximal rectum, colon and  terminal ileum suspicious for inflammatory bowel disease. ie. primarily Crohns colitis. Status post biopsy as described. concomitant NSAID exposure could excaerbate pre-existing inflammatory bowel disease but I do not think todays finding are primarly related to recent ibuprofen use.   Marland Kitchen KNEE ARTHROSCOPY Left   . RESECTION DISTAL CLAVICAL Left 10/15/2012   Procedure: RESECTION DISTAL CLAVICAL;  Surgeon: Vickki Hearing, MD;  Location: [facility];  Service: Orthopedics;  Laterality: Left;  . SHOULDER ACROMIOPLASTY Left 10/15/2012   Procedure: SHOULDER ACROMIOPLASTY;  Surgeon: Vickki Hearing, MD;  Location: [facility];  Service: Orthopedics;  Laterality: Left;  . SHOULDER ARTHROSCOPY WITH OPEN ROTATOR CUFF REPAIR Left 10/15/2012   Procedure: SHOULDER ARTHROSCOPY WITH OPEN ROTATOR CUFF REPAIR;  Surgeon: Vickki Hearing, MD;  Location: [facility];  Service: Orthopedics;  Laterality: Left;  open at 0912; with bicep tendon debridement;   . TONSILLECTOMY      Social  History   Socioeconomic History  . Marital status: Married    Spouse name: Not on file  . Number of children: Not on file  . Years of education: Not on file  . Highest education level: Not on file  Social Needs  . Financial resource strain: Not on file  . Food insecurity - worry: Not on file  . Food insecurity - inability: Not on file  . Transportation needs - medical: Not on file  . Transportation needs - non-medical: Not on file  Occupational History  . Not on file  Tobacco Use  . Smoking status: Former Smoker    Packs/day: 3.00    Years: 30.00    Pack years: 90.00    Types: Cigarettes    Last attempt to quit: 10/11/1993    Years since  quitting: 23.8  . Smokeless tobacco: Former NeurosurgeonUser  . Tobacco comment: Quit approx 20 years ago  Substance and Sexual Activity  . Alcohol use: No    Alcohol/week: 0.0 oz  . Drug use: No  . Sexual activity: Yes    Birth control/protection: None  Other Topics Concern  . Not on file  Social History Narrative  . Not on file     No family history of premature CAD in 1st degree relatives.  Current Meds  Medication Sig  . amLODipine (NORVASC) 2.5 MG tablet Take 2.5 mg by mouth daily.  Marland Kitchen. aspirin EC 81 MG tablet Take 81 mg by mouth daily.  Marland Kitchen. atorvastatin (LIPITOR) 40 MG tablet Take 40 mg by mouth at bedtime.  . budesonide (ENTOCORT EC) 3 MG 24 hr capsule Take 3 mg by mouth daily.   . cholecalciferol (VITAMIN D) 1000 units tablet Take 1,000 Units by mouth daily.  Marland Kitchen. Cod Liver Oil 1000 MG CAPS Take 1 capsule by mouth daily.  . diclofenac (CATAFLAM) 50 MG tablet Take 1 tablet (50 mg total) by mouth 2 (two) times daily.  Marland Kitchen. donepezil (ARICEPT) 10 MG tablet Take 10 mg by mouth at bedtime.   . fish oil-omega-3 fatty acids 1000 MG capsule Take 1 g by mouth 2 (two) times daily.   Marland Kitchen. gabapentin (NEURONTIN) 100 MG capsule Take 1 capsule (100 mg total) by mouth 3 (three) times daily.  Marland Kitchen. ibuprofen (ADVIL,MOTRIN) 800 MG tablet Take 1 tablet (800 mg total) by  mouth 3 (three) times daily. (Patient taking differently: Take 800 mg by mouth every 8 (eight) hours as needed for mild pain or moderate pain. )  . insulin aspart (NOVOLOG) 100 UNIT/ML injection Inject 20-36 Units into the skin See admin instructions. 20 units at lunch time, and 36 units at bedtime  . insulin detemir (LEVEMIR) 100 UNIT/ML injection Inject 24-55 Units into the skin 2 (two) times daily. 55 units each morning, and 24 units at dinner  . metFORMIN (GLUCOPHAGE) 1000 MG tablet Take 1,000 mg by mouth 2 (two) times daily with a meal.    . Multiple Vitamin (MULTIVITAMIN WITH MINERALS) TABS tablet Take 1 tablet by mouth daily.  . niacin (NIASPAN) 500 MG CR tablet Take 500 mg by mouth at bedtime.  . ONGLYZA 5 MG TABS tablet Take 5 mg by mouth every morning.   . pantoprazole (PROTONIX) 40 MG tablet Take 1 tablet (40 mg total) by mouth daily.      Review of systems complete and found to be negative unless listed above in HPI    Physical exam Blood pressure 132/77, pulse 80, height 5' 10.5" (1.791 m), weight 191 lb 12.8 oz (87 kg). General: NAD Neck: No JVD, no thyromegaly or thyroid nodule.  Lungs: Clear to auscultation bilaterally with normal respiratory effort. CV: Nondisplaced PMI. Regular rate and rhythm, normal S1/S2, no S3/S4, no murmur.  No peripheral edema.  No carotid bruit.    Abdomen: Soft, nontender, no distention.  Skin: Intact without lesions or rashes.  Neurologic: Alert and oriented x 3.  Psych: Normal affect. Extremities: No clubbing or cyanosis.  HEENT: Normal.   ECG: Most recent ECG reviewed.   Labs: Lab Results  Component Value Date/Time   K 4.0 03/31/2015 01:35 PM   BUN 12 03/31/2015 01:35 PM   CREATININE 0.90 04/01/2015 05:12 AM   ALT 15 (L) 03/31/2015 01:35 PM   TSH 3.136 03/25/2015 07:30 PM   HGB 12.1 (L) 03/31/2015 01:35 PM  Lipids: No results found for: LDLCALC, LDLDIRECT, CHOL, TRIG, HDL      ASSESSMENT AND PLAN:  1.  Abnormal  ECG/preoperative risk stratification: Given the patient's age and other comorbidities including insulin-dependent diabetes mellitus, hypertension, and hyperlipidemia, there is certainly some degree of cardiovascular risk with orthopedic surgery.  However, he is asymptomatic from a cardiovascular standpoint.  The ECG as noted above does not truly demonstrate inferior infarct but rather right bundle branch block and left anterior fascicular block.  I do not think his perioperative risk is prohibitive and would recommend he proceed with surgery as planned.  As he is on statin therapy, it will help in some degree to attenuate this risk based on its pleiotropic effects.  2.  Hypertension: Blood pressure is mildly elevated.  He is on low-dose amlodipine.  No changes to therapy.  3.  Hyperlipidemia: Continue Lipitor 40 mg.  4.  Insulin-dependent diabetes mellitus: He is on both insulin and metformin.  He is appropriately on statin therapy as well.   Disposition: Follow up as needed   Signed: Prentice Docker, M.D., F.A.C.C.  08/17/2017, 8:48 AM

## 2017-08-17 NOTE — Pre-Procedure Instructions (Signed)
Dr Mort SawyersHarrison's office called to let us know he saw the cardiologist note and will proceed with surgery. Dr Jayme CloudGonzalez also has seen cardiology note. No further orders given.

## 2017-08-17 NOTE — Pre-Procedure Instructions (Signed)
HgbA1C routed to PCP. 

## 2017-08-19 ENCOUNTER — Encounter (HOSPITAL_COMMUNITY): Payer: Self-pay | Admitting: *Deleted

## 2017-08-19 NOTE — H&P (Signed)
Chief Complaint  Patient presents with  . Follow-up      MRI results of right shoulder.    Preop for right shoulder   84 year old male with chronic right shoulder pain had nonoperative treatment did not improve MRI was obtained.  Still complains of dull aching nonradiating severe right shoulder pain and decreased range of motion For several months   Review of systems hard of hearing no chest pain or shortness of breath        Past Surgical History:  Procedure Laterality Date  . CATARACT EXTRACTION W/PHACO Left 03/07/2014    Procedure: CATARACT EXTRACTION PHACO AND INTRAOCULAR LENS PLACEMENT LEFT EYE CDE=7.11;  Surgeon: [name], MD;  Location: [facility];  Service: Ophthalmology;  Laterality: Left;  . CATARACT EXTRACTION W/PHACO Right 03/14/2014    Procedure: CATARACT EXTRACTION PHACO AND INTRAOCULAR LENS PLACEMENT RIGHT EYE CDE=10.62;  Surgeon: [name], MD;  Location: [facility];  Service: Ophthalmology;  Laterality: Right;  . COLONOSCOPY   2009    Dr. Darrick PennaFields: pancolonic diverticulosis, internal hemorrhoids  . COLONOSCOPY N/A 03/29/2015    RMR: inflammatory changes in the proximal rectum, colon and  terminal ileum suspicious for inflammatory bowel disease. ie. primarily Crohns colitis. Status post biopsy as described. concomitant NSAID exposure could excaerbate pre-existing inflammatory bowel disease but I do not think todays finding are primarly related to recent ibuprofen use.   Marland Kitchen. KNEE ARTHROSCOPY Left    . RESECTION DISTAL CLAVICAL Left 10/15/2012    Procedure: RESECTION DISTAL CLAVICAL;  Surgeon: Vickki HearingStanley E Tamanna Whitson, MD;  Location: [facility];  Service: Orthopedics;  Laterality: Left;  . SHOULDER ACROMIOPLASTY Left 10/15/2012    Procedure: SHOULDER ACROMIOPLASTY;  Surgeon: Vickki HearingStanley E Peola Joynt, MD;  Location: [facility];  Service: Orthopedics;  Laterality: Left;  . SHOULDER ARTHROSCOPY WITH OPEN ROTATOR CUFF REPAIR Left 10/15/2012    Procedure: SHOULDER ARTHROSCOPY WITH OPEN ROTATOR CUFF  REPAIR;  Surgeon: Vickki HearingStanley E Brysan Mcevoy, MD;  Location: [facility];  Service: Orthopedics;  Laterality: Left;  open at 0912; with bicep tendon debridement;   . TONSILLECTOMY        No orders of the defined types were placed in this encounter.         Past Medical History:  Diagnosis Date  . Arthritis    . Diabetes mellitus    . Eosinophilic colitis    . GERD (gastroesophageal reflux disease)    . Hypercholesterolemia    . Hypertension      Physical Exam  Constitutional: He is oriented to person, place, and time. He appears well-developed and well-nourished.  Vital signs have been reviewed and are stable. Gen. appearance the patient is well-developed and well-nourished with normal grooming and hygiene.   HENT:  Head: Normocephalic and atraumatic.  poor hearing  Neck: No JVD present. No tracheal deviation present.  Stiffness in the cervical spine  Musculoskeletal:  GAIT IS steady but he takes short strides and walks with a flexed posture  Lymphadenopathy:    He has no cervical adenopathy.  Neurological: He is alert and oriented to person, place, and time.  Skin: Skin is warm and dry. No erythema.  Psychiatric: He has a normal mood and affect.  Vitals reviewed.     Ortho   His exam right shoulder shows weakness of his supraspinatus tendon grade 5 internal/external rotation strength normal decreased internal rotation to the sacrum passive forward elevation of 150 degrees active forward elevation of 150 degrees as well.  No instability is noted he is  neurovascularly intact     Left shoulder range of motion is normal strength is normal stability is normal muscle tone is normal alignment is normal neurovascular exam is normal stability tests are normal     Impression x-rays show chronic rotator cuff changes of the bones of the right shoulder without glenohumeral arthritis     MPRESSION: 1. Partial-thickness bursal surface tear of the anterior aspect of the distal supraspinatus  tendon. 2. Moderate AC joint arthropathy with a joint effusion.     Electronically Signed   By: Francene Boyers M.D.   On: 07/29/2017 16:25   FINDINGS: Rotator cuff: There is a 12 mm partial-thickness bursal surface tear of the distal supraspinatus tendon extending 75% of the way through the tendon. There is a small intrasubstance tear at the musculotendinous junction of the infraspinatus. Teres minor and subscapularis tendons are intact.     Open right rotator cuff repair and AC joint resection   The procedure has been fully reviewed with the patient; The risks and benefits of surgery have been discussed and explained and understood. Alternative treatment has also been reviewed, questions were encouraged and answered. The postoperative plan is also been reviewed.

## 2017-08-20 ENCOUNTER — Encounter (HOSPITAL_COMMUNITY): Payer: Self-pay | Admitting: *Deleted

## 2017-08-20 ENCOUNTER — Ambulatory Visit (HOSPITAL_COMMUNITY)
Admission: RE | Admit: 2017-08-20 | Discharge: 2017-08-20 | Disposition: A | Discharge status: Home / Self Care | Payer: Medicare Other | Source: Ambulatory Visit | Attending: Orthopedic Surgery | Admitting: Orthopedic Surgery

## 2017-08-20 ENCOUNTER — Encounter (HOSPITAL_COMMUNITY)
Admission: RE | Disposition: A | Discharge status: Home / Self Care | Payer: Self-pay | Source: Ambulatory Visit | Attending: Orthopedic Surgery

## 2017-08-20 ENCOUNTER — Ambulatory Visit (HOSPITAL_COMMUNITY): Payer: Medicare Other | Admitting: Anesthesiology

## 2017-08-20 DIAGNOSIS — G8929 Other chronic pain: Secondary | ICD-10-CM | POA: Insufficient documentation

## 2017-08-20 DIAGNOSIS — M75111 Incomplete rotator cuff tear or rupture of right shoulder, not specified as traumatic: Secondary | ICD-10-CM | POA: Insufficient documentation

## 2017-08-20 DIAGNOSIS — E119 Type 2 diabetes mellitus without complications: Secondary | ICD-10-CM | POA: Diagnosis not present

## 2017-08-20 DIAGNOSIS — F0390 Unspecified dementia without behavioral disturbance: Secondary | ICD-10-CM | POA: Insufficient documentation

## 2017-08-20 DIAGNOSIS — M19011 Primary osteoarthritis, right shoulder: Secondary | ICD-10-CM | POA: Diagnosis not present

## 2017-08-20 DIAGNOSIS — Z87891 Personal history of nicotine dependence: Secondary | ICD-10-CM | POA: Insufficient documentation

## 2017-08-20 DIAGNOSIS — M19019 Primary osteoarthritis, unspecified shoulder: Secondary | ICD-10-CM | POA: Diagnosis not present

## 2017-08-20 DIAGNOSIS — Z7984 Long term (current) use of oral hypoglycemic drugs: Secondary | ICD-10-CM | POA: Insufficient documentation

## 2017-08-20 DIAGNOSIS — I10 Essential (primary) hypertension: Secondary | ICD-10-CM | POA: Insufficient documentation

## 2017-08-20 DIAGNOSIS — M75101 Unspecified rotator cuff tear or rupture of right shoulder, not specified as traumatic: Secondary | ICD-10-CM | POA: Diagnosis not present

## 2017-08-20 HISTORY — PX: SHOULDER OPEN ROTATOR CUFF REPAIR: SHX2407

## 2017-08-20 LAB — GLUCOSE, CAPILLARY
GLUCOSE-CAPILLARY: 92 mg/dL (ref 65–99)
GLUCOSE-CAPILLARY: 197 mg/dL — AB (ref 65–99)

## 2017-08-20 SURGERY — REPAIR, ROTATOR CUFF, OPEN
Anesthesia: General | Site: Shoulder | Laterality: Right

## 2017-08-20 MED ORDER — ONDANSETRON HCL 4 MG/2ML IJ SOLN
INTRAMUSCULAR | Status: AC
  Filled 2017-08-20: qty 2

## 2017-08-20 MED ORDER — CHLORHEXIDINE GLUCONATE 4 % EX LIQD: 60.0000 mL | Freq: Once | CUTANEOUS | Status: DC

## 2017-08-20 MED ORDER — FENTANYL CITRATE (PF) 100 MCG/2ML IJ SOLN
INTRAMUSCULAR | Status: DC | PRN
  Administered 2017-08-20 (×5): 50 ug via INTRAVENOUS

## 2017-08-20 MED ORDER — HYDROMORPHONE HCL 1 MG/ML IJ SOLN
0.2500 mg | INTRAMUSCULAR | Status: DC | PRN
  Administered 2017-08-20: 0.25 mg via INTRAVENOUS
  Filled 2017-08-20: qty 1

## 2017-08-20 MED ORDER — MIDAZOLAM HCL 2 MG/2ML IJ SOLN
1.0000 mg | INTRAMUSCULAR | Status: AC
  Administered 2017-08-20: 2 mg via INTRAVENOUS
  Filled 2017-08-20: qty 2

## 2017-08-20 MED ORDER — LIDOCAINE HCL (PF) 1 % IJ SOLN
INTRAMUSCULAR | Status: AC
  Filled 2017-08-20: qty 5

## 2017-08-20 MED ORDER — LACTATED RINGERS IV SOLN
INTRAVENOUS | Status: DC
  Administered 2017-08-20: 1000 mL via INTRAVENOUS

## 2017-08-20 MED ORDER — HEMOSTATIC AGENTS (NO CHARGE) OPTIME
TOPICAL | Status: DC | PRN
  Administered 2017-08-20 (×2): 1 via TOPICAL

## 2017-08-20 MED ORDER — ROCURONIUM 10MG/ML (10ML) SYRINGE FOR MEDFUSION PUMP - OPTIME
INTRAVENOUS | Status: DC | PRN
  Administered 2017-08-20: 6 mg via INTRAVENOUS
  Administered 2017-08-20: 15 mg via INTRAVENOUS

## 2017-08-20 MED ORDER — ACETAMINOPHEN 500 MG PO TABS
ORAL_TABLET | ORAL | Status: AC
  Filled 2017-08-20: qty 1

## 2017-08-20 MED ORDER — ONDANSETRON HCL 4 MG/2ML IJ SOLN
4.0000 mg | Freq: Once | INTRAMUSCULAR | Status: AC
  Administered 2017-08-20: 4 mg via INTRAVENOUS

## 2017-08-20 MED ORDER — HYDROCODONE-ACETAMINOPHEN 5-325 MG PO TABS
1.0000 | ORAL_TABLET | Freq: Once | ORAL | Status: AC
  Administered 2017-08-20: 1 via ORAL

## 2017-08-20 MED ORDER — PROPOFOL 10 MG/ML IV BOLUS
INTRAVENOUS | Status: DC | PRN
  Administered 2017-08-20: 100 mg via INTRAVENOUS

## 2017-08-20 MED ORDER — HYDROCODONE-ACETAMINOPHEN 5-325 MG PO TABS
ORAL_TABLET | ORAL | Status: AC
  Filled 2017-08-20: qty 1

## 2017-08-20 MED ORDER — IBUPROFEN 800 MG PO TABS
800.0000 mg | ORAL_TABLET | Freq: Once | ORAL | Status: AC
  Administered 2017-08-20: 800 mg via ORAL

## 2017-08-20 MED ORDER — BUPIVACAINE-EPINEPHRINE (PF) 0.5% -1:200000 IJ SOLN
INTRAMUSCULAR | Status: AC
  Filled 2017-08-20: qty 60

## 2017-08-20 MED ORDER — FENTANYL CITRATE (PF) 250 MCG/5ML IJ SOLN
INTRAMUSCULAR | Status: AC
  Filled 2017-08-20: qty 5

## 2017-08-20 MED ORDER — CEFAZOLIN SODIUM-DEXTROSE 2-4 GM/100ML-% IV SOLN
2.0000 g | INTRAVENOUS | Status: AC
  Administered 2017-08-20: 2 g via INTRAVENOUS
  Filled 2017-08-20: qty 100

## 2017-08-20 MED ORDER — FENTANYL CITRATE (PF) 100 MCG/2ML IJ SOLN
INTRAMUSCULAR | Status: AC
  Filled 2017-08-20: qty 2

## 2017-08-20 MED ORDER — SUCCINYLCHOLINE CHLORIDE 20 MG/ML IJ SOLN
INTRAMUSCULAR | Status: AC
  Filled 2017-08-20: qty 1

## 2017-08-20 MED ORDER — BUPIVACAINE-EPINEPHRINE (PF) 0.5% -1:200000 IJ SOLN
INTRAMUSCULAR | Status: DC | PRN
  Administered 2017-08-20: 60 mL via PERINEURAL

## 2017-08-20 MED ORDER — METHOCARBAMOL 1000 MG/10ML IJ SOLN
500.0000 mg | Freq: Once | INTRAVENOUS | Status: AC
  Administered 2017-08-20: 500 mg via INTRAVENOUS
  Filled 2017-08-20: qty 550

## 2017-08-20 MED ORDER — HYDROCODONE-ACETAMINOPHEN 5-325 MG PO TABS: 1.0000 | ORAL_TABLET | ORAL | 0 refills | Status: DC | PRN

## 2017-08-20 MED ORDER — ACETAMINOPHEN 500 MG PO TABS
500.0000 mg | ORAL_TABLET | Freq: Once | ORAL | Status: AC
  Administered 2017-08-20: 500 mg via ORAL

## 2017-08-20 MED ORDER — SUCCINYLCHOLINE 20MG/ML (10ML) SYRINGE FOR MEDFUSION PUMP - OPTIME
INTRAMUSCULAR | Status: DC | PRN
  Administered 2017-08-20: 110 mg via INTRAVENOUS

## 2017-08-20 MED ORDER — ROCURONIUM BROMIDE 50 MG/5ML IV SOLN
INTRAVENOUS | Status: AC
  Filled 2017-08-20: qty 1

## 2017-08-20 MED ORDER — GABAPENTIN 100 MG PO CAPS
100.0000 mg | ORAL_CAPSULE | Freq: Once | ORAL | Status: AC
  Administered 2017-08-20: 100 mg via ORAL
  Filled 2017-08-20: qty 1

## 2017-08-20 MED ORDER — ONDANSETRON HCL 4 MG/2ML IJ SOLN
4.0000 mg | Freq: Once | INTRAMUSCULAR | Status: AC
  Administered 2017-08-20: 4 mg via INTRAVENOUS
  Filled 2017-08-20: qty 2

## 2017-08-20 MED ORDER — PHENYLEPHRINE HCL 10 MG/ML IJ SOLN
INTRAMUSCULAR | Status: DC | PRN
  Administered 2017-08-20: 80 ug via INTRAVENOUS
  Administered 2017-08-20: 40 ug via INTRAVENOUS

## 2017-08-20 MED ORDER — EPHEDRINE SULFATE 50 MG/ML IJ SOLN
INTRAMUSCULAR | Status: DC | PRN
  Administered 2017-08-20: 10 mg via INTRAVENOUS
  Administered 2017-08-20 (×4): 5 mg via INTRAVENOUS

## 2017-08-20 MED ORDER — 0.9 % SODIUM CHLORIDE (POUR BTL) OPTIME
TOPICAL | Status: DC | PRN
  Administered 2017-08-20: 1000 mL

## 2017-08-20 MED ORDER — PROPOFOL 10 MG/ML IV BOLUS
INTRAVENOUS | Status: AC
  Filled 2017-08-20: qty 20

## 2017-08-20 MED ORDER — METOPROLOL TARTRATE 5 MG/5ML IV SOLN
INTRAVENOUS | Status: AC
  Filled 2017-08-20: qty 5

## 2017-08-20 MED ORDER — DEXAMETHASONE SODIUM PHOSPHATE 4 MG/ML IJ SOLN
4.0000 mg | Freq: Once | INTRAMUSCULAR | Status: AC
  Administered 2017-08-20: 4 mg via INTRAVENOUS
  Filled 2017-08-20: qty 1

## 2017-08-20 MED ORDER — IBUPROFEN 800 MG PO TABS
ORAL_TABLET | ORAL | Status: AC
  Filled 2017-08-20: qty 1

## 2017-08-20 MED ORDER — EPHEDRINE SULFATE 50 MG/ML IJ SOLN
INTRAMUSCULAR | Status: AC
  Filled 2017-08-20: qty 1

## 2017-08-20 MED ORDER — SODIUM CHLORIDE 0.9 % IJ SOLN
INTRAMUSCULAR | Status: AC
  Filled 2017-08-20: qty 10

## 2017-08-20 MED ORDER — METOPROLOL TARTRATE 5 MG/5ML IV SOLN
1.0000 mg | INTRAVENOUS | Status: DC | PRN
  Administered 2017-08-20: 1 mg via INTRAVENOUS

## 2017-08-20 SURGICAL SUPPLY — 57 items
ANCHOR SUT 5.5 SPEEDSCREW (Screw) ×2 IMPLANT
BIT DRILL 2.0MX128MM (BIT) IMPLANT
BLADE HEX COATED 2.75 (ELECTRODE) ×3 IMPLANT
BLADE OSC/SAGITTAL MD 9X18.5 (BLADE) ×3 IMPLANT
BUR FAST CUTTING (BURR)
BUR ROUND 5.0 (BURR) IMPLANT
BUR SRG 54X4.7X12 FLUT (BURR) IMPLANT
BURR SRG 54X4.7X12 FLUT (BURR)
CHLORAPREP W/TINT 26ML (MISCELLANEOUS) ×3 IMPLANT
CLOTH BEACON ORANGE TIMEOUT ST (SAFETY) ×3 IMPLANT
CONNECTOR PERFECT PASSER (CONNECTOR) ×2 IMPLANT
COVER LIGHT HANDLE STERIS (MISCELLANEOUS) ×6 IMPLANT
DRAPE PROXIMA HALF (DRAPES) ×3 IMPLANT
DRESSING ALLEVYN BORDER 5X5 (GAUZE/BANDAGES/DRESSINGS) ×3 IMPLANT
DRESSING MEPILEX BORDER 6X8 (GAUZE/BANDAGES/DRESSINGS) IMPLANT
DRSG MEPILEX BORDER 6X8 (GAUZE/BANDAGES/DRESSINGS) ×3
ELECT REM PT RETURN 9FT ADLT (ELECTROSURGICAL) ×3
ELECTRODE REM PT RTRN 9FT ADLT (ELECTROSURGICAL) ×1 IMPLANT
GLOVE BIO SURGEON STRL SZ7 (GLOVE) ×4 IMPLANT
GLOVE BIOGEL PI IND STRL 7.0 (GLOVE) ×1 IMPLANT
GLOVE BIOGEL PI INDICATOR 7.0 (GLOVE) ×6
GLOVE SKINSENSE NS SZ8.0 LF (GLOVE) ×2
GLOVE SKINSENSE STRL SZ8.0 LF (GLOVE) ×1 IMPLANT
GLOVE SS N UNI LF 8.5 STRL (GLOVE) ×3 IMPLANT
GOWN STRL REUS W/TWL LRG LVL3 (GOWN DISPOSABLE) ×6 IMPLANT
GOWN STRL REUS W/TWL XL LVL3 (GOWN DISPOSABLE) ×3 IMPLANT
HEMOSTAT SURGICEL 4X8 (HEMOSTASIS) ×4 IMPLANT
INST SET MINOR BONE (KITS) ×3 IMPLANT
KIT BLADEGUARD II DBL (SET/KITS/TRAYS/PACK) ×3 IMPLANT
KIT ROOM TURNOVER APOR (KITS) ×3 IMPLANT
MANIFOLD NEPTUNE II (INSTRUMENTS) ×3 IMPLANT
MARKER SKIN DUAL TIP RULER LAB (MISCELLANEOUS) ×3 IMPLANT
NDL HYPO 21X1.5 SAFETY (NEEDLE) ×1 IMPLANT
NDL MA TROC 1/2 (NEEDLE) IMPLANT
NDL MAYO 6 CRC TAPER PT (NEEDLE) IMPLANT
NEEDLE HYPO 21X1.5 SAFETY (NEEDLE) ×3 IMPLANT
NEEDLE MA TROC 1/2 (NEEDLE) IMPLANT
NEEDLE MAYO 6 CRC TAPER PT (NEEDLE) ×3 IMPLANT
NS IRRIG 1000ML POUR BTL (IV SOLUTION) ×3 IMPLANT
PACK TOTAL JOINT (CUSTOM PROCEDURE TRAY) ×3 IMPLANT
PAD ARMBOARD 7.5X6 YLW CONV (MISCELLANEOUS) ×3 IMPLANT
PASSER SUT CAPTURE FIRST (SUTURE) IMPLANT
PASSER SUT SWANSON 36MM LOOP (INSTRUMENTS) ×3 IMPLANT
RASP SM TEAR CROSS CUT (RASP) ×2 IMPLANT
SET BASIN LINEN APH (SET/KITS/TRAYS/PACK) ×3 IMPLANT
SLING ARM IMMOBILIZER LRG (SOFTGOODS) ×2 IMPLANT
STAPLER VISISTAT 35W (STAPLE) ×2 IMPLANT
SUT BONE WAX W31G (SUTURE) ×2 IMPLANT
SUT ETHIBOND NAB OS 4 #2 30IN (SUTURE) ×3 IMPLANT
SUT ETHILON 3 0 FSL (SUTURE) IMPLANT
SUT MON AB 0 CT1 (SUTURE) ×7 IMPLANT
SUT MON AB 2-0 CT1 36 (SUTURE) ×3 IMPLANT
SUT PROLENE 3 0 PS 1 (SUTURE) IMPLANT
SUT SMART STITCH CARTRIDGE (SUTURE) ×4 IMPLANT
SYR 30ML LL (SYRINGE) ×3 IMPLANT
SYR BULB IRRIGATION 50ML (SYRINGE) ×3 IMPLANT
YANKAUER SUCT 12FT TUBE ARGYLE (SUCTIONS) ×3 IMPLANT

## 2017-08-20 NOTE — Discharge Instructions (Signed)

## 2017-08-20 NOTE — Interval H&P Note (Signed)
History and Physical Interval Note:  08/20/2017 7:27 AM  BP (!) 163/81   Temp 98.5 F (36.9 C)   Resp 16   SpO2 98%    [name]  has presented today for surgery, with the diagnosis of Rotator cuff tear right shoulder and acromial clavicular joint arthritis  The various methods of treatment have been discussed with the patient and family. After consideration of risks, benefits and other options for treatment, the patient has consented to  Procedure(s): ROTATOR CUFF REPAIR SHOULDER OPEN with removal of distal clavicle (Right) as a surgical intervention .  The patient's history has been reviewed, patient examined, no change in status, stable for surgery.  I have reviewed the patient's chart and labs.  Questions were answered to the patient's satisfaction.     [name]

## 2017-08-20 NOTE — Brief Op Note (Signed)
08/20/2017  9:36 AM  PATIENT:  [name]  [id number] y.o. male  PRE-OPERATIVE DIAGNOSIS:  Rotator cuff tear right shoulder and acromial clavicular joint arthritis  POST-OPERATIVE DIAGNOSIS:  Rotator cuff tear right shoulder and acromial clavicular joint arthritis  PROCEDURE:  Procedure(s): ROTATOR CUFF REPAIR SHOULDER OPEN with removal of distal clavicle (Right)   Operative findings 90% bursal surface tear supraspinatus tendon front to back 1 cm retracted 1/2 cm  Acromioclavicular bulky arthrosis  1 suture anchor from the [employer] speed screw with one side to side suture   SURGEON:  Surgeon(s) and Role:    * Vickki HearingHarrison, Tyyne Cliett E, MD - Primary  PHYSICIAN ASSISTANT:   ASSISTANTS: betty ashley    ANESTHESIA:   general  EBL:  Minimal < 25 cc   BLOOD ADMINISTERED:none  DRAINS: none   LOCAL MEDICATIONS USED:  MARCAINE     SPECIMEN:  Source of Specimen:  right distal clavicle  DISPOSITION OF SPECIMEN:  PATHOLOGY  COUNTS:  YES  TOURNIQUET:  * No tourniquets in log *  DICTATION: .Dragon Dictation  PLAN OF CARE: unknown   PATIENT DISPOSITION:  PACU - hemodynamically stable.   Delay start of Pharmacological VTE agent (>24hrs) due to surgical blood loss or risk of bleeding: not applicable

## 2017-08-20 NOTE — Op Note (Signed)
Operative report  Today's date August 20, 2017  Preop diagnosis torn rotator cuff right shoulder, acromioclavicular arthritis  Postop diagnosis same  Procedure open right rotator cuff repair and open distal clavicle excision  Surgeon Romeo AppleHarrison assisted by [place] NationBetty Ashley  General anesthesia  No complication sponge and needle counts were correct  Minimal blood loss less than 25 cc  Marcaine with epinephrine 60 cc injected divided and the incision pre-and postoperatively   Surgical procedure was done as follows  Operative findings 1 cm tear 5 mm retraction supraspinatus tendon right shoulder tear appear to be coming superior to inferior proximal 90% width of the tendon.  In the preop area the surgical site was confirmed and marked, chart review was completed.  Chart update was completed.  Patient was taken to surgery for general anesthesia in the supine position he was then placed in the modified beachchair position and care was taken to provide padding to all the bony prominences.  After sterile prep and drape timeout was completed.  The site was confirmed  Implants were available.   At the anterolateral corner of the acromion a longitudinal incision was made in Langer's lines of cutaneous tissue was divided deltoid fascia was split up to the acromion.  Bursectomy was performed.  Care was readily visible.  The tear was debrided.  A clamp was placed on the tendon and the tendon came over easily to the tuberosity which was debrided until bony bed was noted  We irrigated the joint did further bursectomy and then passed a [employer] inverted mattress suture in the tendon.  We then placed a bony tamp and tap to make a hole for the anchor which was placed and then per manual fracture technique the sutures from the tendon were placed in the anchor and this brought the tendon over nicely.  I then placed an inverted figure-of-eight suture to make a watertight closure  Range of motion was  checked and was excellent  Wound was irrigated and closed with 0 Monocryl in 2 layers and staples  I then made a longitudinal incision over the distal clavicle divided the subcutaneous tissue down to the fascia I opened the [facility] from front to back subperiosteal dissection exposed the distal end of the clavicle and the oscillating saw was used to make an incomplete ostectomy which was completed with an osteotome and controlled manner  Distal clavicle was removed smoothed and bone wax was placed over the end  There was a lot of bleeding which required several pieces of Surgicel which were removed when the bleeding was controlled.  One small piece was left in.  The wound was closed by closing the fascia with 0 Monocryl subcuticular running stitch with 0 Monocryl and staples.  The other 30 cc of remaining Marcaine with epinephrine was injected in the wound edges and a sterile bandage was applied  Patient was placed in a sling extubated and taken to recovery room in stable condition  [id number]

## 2017-08-20 NOTE — Anesthesia Procedure Notes (Signed)
Procedure Name: Intubation Date/Time: 08/20/2017 7:44 AM Performed by: Moshe Salisburyaniel, Marquarius Lofton E, CRNA Pre-anesthesia Checklist: Patient identified, Patient being monitored, Timeout performed, Emergency Drugs available and Suction available Patient Re-evaluated:Patient Re-evaluated prior to induction Oxygen Delivery Method: Circle system utilized Preoxygenation: Pre-oxygenation with 100% oxygen Induction Type: IV induction Ventilation: Mask ventilation without difficulty Laryngoscope Size: Glidescope (s3) Grade View: Grade I Tube type: Oral Tube size: 7.0 mm Number of attempts: 1 Airway Equipment and Method: Stylet Placement Confirmation: ETT inserted through vocal cords under direct vision,  positive ETCO2 and breath sounds checked- equal and bilateral Secured at: 22 cm Tube secured with: Tape Dental Injury: Teeth and Oropharynx as per pre-operative assessment

## 2017-08-20 NOTE — Anesthesia Preprocedure Evaluation (Signed)
Anesthesia Evaluation  Patient identified by MRN, date of birth, ID band Patient awake    Reviewed: Allergy & Precautions, H&P , NPO status , Patient's Chart, lab work & pertinent test results  Airway Mallampati: III  TM Distance: >3 FB Neck ROM: Limited    Dental  (+) Edentulous Upper, Edentulous Lower   Pulmonary neg pulmonary ROS, former smoker,    breath sounds clear to auscultation       Cardiovascular hypertension, Pt. on medications  Rhythm:Regular Rate:Normal     Neuro/Psych PSYCHIATRIC DISORDERS (mild dementia)    GI/Hepatic GERD  Medicated and Controlled,  Endo/Other  diabetes, Well Controlled, Type 2, Oral Hypoglycemic Agents  Renal/GU      Musculoskeletal  (+) Arthritis ,   Abdominal   Peds  Hematology   Anesthesia Other Findings   Reproductive/Obstetrics                             Anesthesia Physical Anesthesia Plan  ASA: III  Anesthesia Plan: General   Post-op Pain Management:    Induction: Intravenous  PONV Risk Score and Plan:   Airway Management Planned: Oral ETT and Video Laryngoscope Planned  Additional Equipment:   Intra-op Plan:   Post-operative Plan: Extubation in OR  Informed Consent: I have reviewed the patients History and Physical, chart, labs and discussed the procedure including the risks, benefits and alternatives for the proposed anesthesia with the patient or authorized representative who has indicated his/her understanding and acceptance.     Plan Discussed with:   Anesthesia Plan Comments:         Anesthesia Quick Evaluation

## 2017-08-20 NOTE — Transfer of Care (Signed)
Immediate Anesthesia Transfer of Care Note  Patient: [name]  Procedure(s) Performed: ROTATOR CUFF REPAIR SHOULDER OPEN with removal of distal clavicle (Right Shoulder)  Patient Location: PACU  Anesthesia Type:General  Level of Consciousness: awake and alert   Airway & Oxygen Therapy: Patient Spontanous Breathing and Patient connected to face mask oxygen  Post-op Assessment: Report given to RN  Post vital signs: Reviewed and stable  Last Vitals:  Vitals:   08/20/17 0730 08/20/17 0950  BP: (!) 147/77 (!) (P) 154/74  Resp: 18 (P) 17  Temp:  (P) 36.6 C  SpO2: 94% (P) 96%    Last Pain: There were no vitals filed for this visit.       Complications: No apparent anesthesia complications

## 2017-08-20 NOTE — Anesthesia Postprocedure Evaluation (Signed)
Anesthesia Post Note  Patient: Ples SpecterFrancis Giancola  Procedure(s) Performed: ROTATOR CUFF REPAIR SHOULDER OPEN with removal of distal clavicle (Right Shoulder)  Patient location during evaluation: PACU Anesthesia Type: General Level of consciousness: awake and patient cooperative Pain management: satisfactory to patient Vital Signs Assessment: post-procedure vital signs reviewed and stable Respiratory status: spontaneous breathing Cardiovascular status: stable Postop Assessment: no apparent nausea or vomiting Anesthetic complications: no     Last Vitals:  Vitals:   08/20/17 1015 08/20/17 1030  BP: (!) 155/80 (!) 143/84  Pulse: (!) 108 (!) 106  Resp: 18 17  Temp:    SpO2: 99% 100%    Last Pain:  Vitals:   08/20/17 1030  PainSc: 3                  Rudolpho Claxton

## 2017-08-21 ENCOUNTER — Encounter (HOSPITAL_COMMUNITY): Payer: Self-pay | Admitting: Orthopedic Surgery

## 2017-08-28 ENCOUNTER — Encounter: Payer: Self-pay | Admitting: Orthopedic Surgery

## 2017-08-28 ENCOUNTER — Ambulatory Visit (INDEPENDENT_AMBULATORY_CARE_PROVIDER_SITE_OTHER): Payer: Self-pay | Admitting: Orthopedic Surgery

## 2017-08-28 VITALS — BP 142/68 | HR 84 | Ht 67.5 in | Wt 191.0 lb

## 2017-08-28 DIAGNOSIS — Z9889 Other specified postprocedural states: Secondary | ICD-10-CM

## 2017-08-28 DIAGNOSIS — Z4889 Encounter for other specified surgical aftercare: Secondary | ICD-10-CM

## 2017-08-28 MED ORDER — HYDROCODONE-ACETAMINOPHEN 5-325 MG PO TABS: 1.0000 | ORAL_TABLET | ORAL | 0 refills | Status: DC | PRN

## 2017-08-28 NOTE — Progress Notes (Signed)
POST OP VISIT   Patient ID: [name], male   DOB: 1930-05-17, 84 y.o.   MRN: [id number]  Chief Complaint  Patient presents with  . Post-op Follow-up    [facility]    Encounter Diagnoses  Name Primary?  . S/P rotator cuff repair right with Distal clavicle resection  08/20/17 Yes  . Aftercare following surgery     S/P   open right rotator cuff repair distal clavicle excision Findings at surgery:   Rotator cuff tear with acromioclavicular arthrosis  Wounds look clean dry and intact.  He would like his medicine refilled follow-up in a week staples out and schedule therapy next time

## 2017-09-04 ENCOUNTER — Ambulatory Visit (INDEPENDENT_AMBULATORY_CARE_PROVIDER_SITE_OTHER): Payer: Medicare Other | Admitting: Orthopedic Surgery

## 2017-09-04 ENCOUNTER — Encounter: Payer: Self-pay | Admitting: Orthopedic Surgery

## 2017-09-04 VITALS — BP 150/90 | HR 94 | Ht 67.5 in | Wt 191.0 lb

## 2017-09-04 DIAGNOSIS — Z9889 Other specified postprocedural states: Secondary | ICD-10-CM

## 2017-09-04 NOTE — Patient Instructions (Signed)
Continue sling times 4 weeks

## 2017-09-04 NOTE — Progress Notes (Signed)
POST OP VISIT   Patient ID: [name], male   DOB: [birth year], 84 y.o.   MRN: [id number]  Chief Complaint  Patient presents with  . Post-op Follow-up    right shoulder 08/20/17    Encounter Diagnosis  Name Primary?  . S/P rotator cuff repair right  08/20/17 Yes   Rotator cuff repair open with open distal clavicle excision postop day 15 staples removed wounds are clean patient will go back in a sling for 4 weeks and start home therapy because he is not able to drive and his wife cannot drive as well.

## 2017-09-07 ENCOUNTER — Telehealth: Payer: Self-pay | Admitting: Orthopedic Surgery

## 2017-09-07 NOTE — Telephone Encounter (Signed)
He needs to look at the order, it states OT/ PT needed. I will call him

## 2017-09-07 NOTE — Telephone Encounter (Signed)
I called, they did find this, but now they need Dr Romeo AppleHarrison' s shoulder protocol.   I will find it and fax to [name].

## 2017-09-07 NOTE — Telephone Encounter (Signed)
Call received from Tyron RussellJoey H, ph# [phone], therapist, [facility] - requests orders for occupational therapy for right shoulder, status post rotator cuff surgery.

## 2017-09-08 NOTE — Telephone Encounter (Signed)
Surgery protocol Dr. Romeo AppleHarrison  Weeks 0 through 6 passive range of motion as tolerated protect rotator cuff with no excessive abduction external rotation or forward elevation  Weeks 7 through 12 progressive resistance exercises continue range of motion exercises as tolerated  Weeks 13 through 18 continue strengthening exercises address any range of motion deficits   Weeks 0 through 18 use any modalities as needed

## 2017-09-08 NOTE — Telephone Encounter (Signed)
Do you have a shoulder protocol I can send [name]?

## 2017-09-09 ENCOUNTER — Telehealth: Payer: Self-pay | Admitting: Orthopedic Surgery

## 2017-09-09 NOTE — Telephone Encounter (Signed)
Thank you I have sent this to [name] with [facility] so his OT can have.

## 2017-09-09 NOTE — Telephone Encounter (Signed)
Mallorie, therapist from [facility], received the occupational therapy orders. Requesting verbal orders regarding Plan of care as follows:  2x per week x1 week  1x per week x3 weeks  2x per week x2 weeks Also, asking if patientmay begin ROM and strengthening exercises for right elbow, hand and wrist.  Direct PH# [phone]

## 2017-09-11 NOTE — Telephone Encounter (Signed)
I called to give verbal, asked about the once weekly, during the plan, and she advised this was for PROM exercises, and plan is to have wife work with him on the days she does not come out.

## 2017-09-15 ENCOUNTER — Telehealth: Payer: Self-pay | Admitting: Orthopedic Surgery

## 2017-09-15 NOTE — Telephone Encounter (Signed)
[facility] and she will increase his PT to 2x weekly, I told her this is great, Dr Romeo AppleHarrison had indicated he may need more than once weekly, depending on how much his wife would help.

## 2017-09-15 NOTE — Telephone Encounter (Signed)
[facility] with [facility] would like to talk with you in reference to this patient.  [phone].  Please call

## 2017-10-01 ENCOUNTER — Encounter: Payer: Self-pay | Admitting: *Deleted

## 2017-10-01 ENCOUNTER — Other Ambulatory Visit: Payer: Self-pay | Admitting: Neurology

## 2017-10-01 DIAGNOSIS — R4182 Altered mental status, unspecified: Secondary | ICD-10-CM

## 2017-10-07 ENCOUNTER — Ambulatory Visit (INDEPENDENT_AMBULATORY_CARE_PROVIDER_SITE_OTHER): Payer: Medicare Other | Admitting: Orthopedic Surgery

## 2017-10-07 ENCOUNTER — Ambulatory Visit (HOSPITAL_COMMUNITY)
Admission: RE | Admit: 2017-10-07 | Discharge: 2017-10-07 | Disposition: A | Discharge status: Home / Self Care | Payer: Medicare Other | Source: Ambulatory Visit | Attending: Neurology | Admitting: Neurology

## 2017-10-07 ENCOUNTER — Encounter: Payer: Self-pay | Admitting: Orthopedic Surgery

## 2017-10-07 VITALS — BP 133/79 | HR 88 | Ht 67.5 in | Wt 191.0 lb

## 2017-10-07 DIAGNOSIS — G319 Degenerative disease of nervous system, unspecified: Secondary | ICD-10-CM | POA: Insufficient documentation

## 2017-10-07 DIAGNOSIS — Z9889 Other specified postprocedural states: Secondary | ICD-10-CM

## 2017-10-07 DIAGNOSIS — I6782 Cerebral ischemia: Secondary | ICD-10-CM | POA: Insufficient documentation

## 2017-10-07 DIAGNOSIS — R4182 Altered mental status, unspecified: Secondary | ICD-10-CM | POA: Diagnosis not present

## 2017-10-07 NOTE — Progress Notes (Signed)
Post op   Chief Complaint  Patient presents with  . Post-op Follow-up    08/20/17 DCR and RCR on 08/20/17   Postop 7 weeks 6 days 48 days after surgery wound looks good passive range of motion with arm at side good PT reports 110 degrees flexion okay to start strengthening okay to drive advance therapy with strengthening follow-up 6 weeks  Encounter Diagnosis  Name Primary?  . S/P rotator cuff repair right  08/20/17 Yes

## 2017-10-27 ENCOUNTER — Encounter: Payer: Self-pay | Admitting: Gastroenterology

## 2017-10-29 ENCOUNTER — Telehealth: Payer: Self-pay | Admitting: Orthopedic Surgery

## 2017-10-29 NOTE — Telephone Encounter (Signed)
Malerie from [facility] is wanting you to please call her in regards to this patient.  [phone]

## 2017-10-30 NOTE — Telephone Encounter (Signed)
I called her he needs extension of therapy 3 more weeks, to continue to work on ROM, advised per Dr Romeo AppleHarrison this is great, thanked them for continuing to work with him.

## 2017-11-18 ENCOUNTER — Ambulatory Visit (INDEPENDENT_AMBULATORY_CARE_PROVIDER_SITE_OTHER): Payer: Medicare Other

## 2017-11-18 ENCOUNTER — Ambulatory Visit (INDEPENDENT_AMBULATORY_CARE_PROVIDER_SITE_OTHER): Payer: Medicare Other | Admitting: Orthopedic Surgery

## 2017-11-18 ENCOUNTER — Encounter: Payer: Self-pay | Admitting: Orthopedic Surgery

## 2017-11-18 VITALS — BP 129/67 | HR 105 | Ht 67.5 in | Wt 190.0 lb

## 2017-11-18 DIAGNOSIS — T8484XA Pain due to internal orthopedic prosthetic devices, implants and grafts, initial encounter: Secondary | ICD-10-CM

## 2017-11-18 DIAGNOSIS — G8929 Other chronic pain: Secondary | ICD-10-CM

## 2017-11-18 DIAGNOSIS — Z9889 Other specified postprocedural states: Secondary | ICD-10-CM | POA: Diagnosis not present

## 2017-11-18 DIAGNOSIS — M25571 Pain in right ankle and joints of right foot: Secondary | ICD-10-CM

## 2017-11-18 NOTE — Progress Notes (Signed)
POSTOP VISIT  POD # day 90   BP 129/67   Pulse (!) 105   Ht 5' 7.5" (1.715 m)   Wt 190 lb (86.2 kg)   BMI 29.32 kg/m   Encounter Diagnoses  Name Primary?  . S/P rotator cuff repair right  08/20/17 Yes  . Chronic pain of right ankle   . Painful orthopaedic hardware [facility](HCC)       New problem postop global period  Patient ID: [name], male   DOB: Jul 28, 1930, 84 y.o.   MRN: [id number]    HPI [name] is a 84 y.o. male.    Right ankle pain HPI 84 year old male status post bimalleolar ankle fracture with internal fixation many years ago done around 1970 complains of painful prominent hardware lateral ankle noticed over the last month or 2 no associated swelling Review of Systems Review of Systems  Constitutional: Negative for chills and fever.  HENT: Positive for hearing loss.      Past Medical History:  Diagnosis Date  . Arthritis   . Dementia    early stages  . Diabetes mellitus   . Eosinophilic colitis   . GERD (gastroesophageal reflux disease)   . HOH (hard of hearing)   . Hypercholesterolemia   . Hypertension      Physical Exam  Constitutional: He is oriented to person, place, and time. He appears well-developed and well-nourished.  Vital signs have been reviewed and are stable. Gen. appearance the patient is well-developed and well-nourished with normal grooming and hygiene.   Musculoskeletal:       Feet:  Neurological: He is alert and oriented to person, place, and time.  Skin: Skin is warm and dry. No erythema.  Psychiatric: He has a normal mood and affect.  Vitals reviewed.   MEDICAL DECISION MAKING  DATA   Ordered x-rays: See report; it shows 2 screws one on each side of the joint no fracture dislocation or ankle mortise disruption  Encounter Diagnoses  Name Primary?  . S/P rotator cuff repair right  08/20/17 Yes  . Chronic pain of right ankle   . Painful orthopaedic hardware [facility](HCC)    Patient thinks that the screw is causing  irritation when he wears shoes PLAN    I discussed this with him at length potential for infection re-fracture screw breakage I told him to work on this for a month to be comes back and he still wants to take the risk we will try to get the screw out

## 2017-11-24 ENCOUNTER — Other Ambulatory Visit: Payer: Self-pay | Admitting: Neurology

## 2017-11-24 DIAGNOSIS — I6203 Nontraumatic chronic subdural hemorrhage: Secondary | ICD-10-CM

## 2017-12-14 ENCOUNTER — Ambulatory Visit (HOSPITAL_COMMUNITY)
Admission: RE | Admit: 2017-12-14 | Discharge: 2017-12-14 | Disposition: A | Discharge status: Home / Self Care | Payer: Medicare Other | Source: Ambulatory Visit | Attending: Neurology | Admitting: Neurology

## 2017-12-14 DIAGNOSIS — I6203 Nontraumatic chronic subdural hemorrhage: Secondary | ICD-10-CM | POA: Insufficient documentation

## 2017-12-16 ENCOUNTER — Ambulatory Visit: Payer: Medicare Other | Admitting: Orthopedic Surgery

## 2018-01-06 ENCOUNTER — Ambulatory Visit: Payer: Self-pay | Admitting: Family Medicine

## 2018-01-06 ENCOUNTER — Other Ambulatory Visit (HOSPITAL_COMMUNITY): Payer: Self-pay | Admitting: Family Medicine

## 2018-01-06 DIAGNOSIS — I739 Peripheral vascular disease, unspecified: Secondary | ICD-10-CM

## 2018-01-08 ENCOUNTER — Ambulatory Visit (HOSPITAL_COMMUNITY)
Admission: RE | Admit: 2018-01-08 | Discharge: 2018-01-08 | Disposition: A | Discharge status: Home / Self Care | Payer: Medicare Other | Source: Ambulatory Visit | Attending: Family Medicine | Admitting: Family Medicine

## 2018-01-08 DIAGNOSIS — I739 Peripheral vascular disease, unspecified: Secondary | ICD-10-CM | POA: Insufficient documentation

## 2018-03-03 ENCOUNTER — Ambulatory Visit: Payer: Medicare Other | Admitting: Vascular Surgery

## 2018-03-03 ENCOUNTER — Other Ambulatory Visit: Payer: Self-pay

## 2018-03-03 ENCOUNTER — Encounter: Payer: Self-pay | Admitting: Vascular Surgery

## 2018-03-03 DIAGNOSIS — I70219 Atherosclerosis of native arteries of extremities with intermittent claudication, unspecified extremity: Secondary | ICD-10-CM

## 2018-03-03 MED ORDER — CILOSTAZOL 100 MG PO TABS: 100.0000 mg | ORAL_TABLET | Freq: Two times a day (BID) | ORAL | 11 refills | Status: DC

## 2018-03-03 NOTE — Progress Notes (Signed)
Requested by:  Salley Scarleturham, Kawanta F, MD [address] [place], [place] [id number]  Reason for consultation: bilateral calf pain   History of Present Illness   Brandon SpecterFrancis Kramer is a 84 y.o. (02-09-1930) male who presents with chief complaint: bilateral leg pain.  Onset of symptom occurred years ago.  Pain is described as aching in calves after walking, severity 3-6/10, and associated with ambulation.  Patient also notes some neuropathic sx in the dorsum of his left foot.  Patient has attempted to treat this pain with rest.  The patient has no rest pain symptoms also and no leg wounds/ulcers.    Atherosclerotic risk factors include: DM, HLD, HTN, prior smoker  Past Medical History:  Diagnosis Date  . Arthritis   . Dementia    early stages  . Diabetes mellitus   . Eosinophilic colitis   . GERD (gastroesophageal reflux disease)   . HOH (hard of hearing)   . Hypercholesterolemia   . Hypertension     Past Surgical History:  Procedure Laterality Date  . CATARACT EXTRACTION W/PHACO Left 03/07/2014   Procedure: CATARACT EXTRACTION PHACO AND INTRAOCULAR LENS PLACEMENT LEFT EYE CDE=7.11;  Surgeon: [name], MD;  Location: [facility];  Service: Ophthalmology;  Laterality: Left;  . CATARACT EXTRACTION W/PHACO Right 03/14/2014   Procedure: CATARACT EXTRACTION PHACO AND INTRAOCULAR LENS PLACEMENT RIGHT EYE CDE=10.62;  Surgeon: [name], MD;  Location: [facility];  Service: Ophthalmology;  Laterality: Right;  . COLONOSCOPY  2009   Dr. Darrick PennaFields: pancolonic diverticulosis, internal hemorrhoids  . COLONOSCOPY N/A 03/29/2015   RMR: inflammatory changes in the proximal rectum, colon and  terminal ileum suspicious for inflammatory bowel disease. ie. primarily Crohns colitis. Status post biopsy as described. concomitant NSAID exposure could excaerbate pre-existing inflammatory bowel disease but I do not think todays finding are primarly related to recent ibuprofen use.   Marland Kitchen. KNEE ARTHROSCOPY Left   .  RESECTION DISTAL CLAVICAL Left 10/15/2012   Procedure: RESECTION DISTAL CLAVICAL;  Surgeon: [name], MD;  Location: [facility];  Service: Orthopedics;  Laterality: Left;  . SHOULDER ACROMIOPLASTY Left 10/15/2012   Procedure: SHOULDER ACROMIOPLASTY;  Surgeon: [name], MD;  Location: [facility];  Service: Orthopedics;  Laterality: Left;  . SHOULDER ARTHROSCOPY WITH OPEN ROTATOR CUFF REPAIR Left 10/15/2012   Procedure: SHOULDER ARTHROSCOPY WITH OPEN ROTATOR CUFF REPAIR;  Surgeon: [name], MD;  Location: [facility];  Service: Orthopedics;  Laterality: Left;  open at 0912; with bicep tendon debridement;   . SHOULDER OPEN ROTATOR CUFF REPAIR Right 08/20/2017   Procedure: ROTATOR CUFF REPAIR SHOULDER OPEN with removal of distal clavicle;  Surgeon: Vickki HearingHarrison, Stanley E, MD;  Location: [facility];  Service: Orthopedics;  Laterality: Right;  . TONSILLECTOMY      Social History   Socioeconomic History  . Marital status: Married    Spouse name: Not on file  . Number of children: Not on file  . Years of education: Not on file  . Highest education level: Not on file  Occupational History  . Not on file  Social Needs  . Financial resource strain: Not on file  . Food insecurity:    Worry: Not on file    Inability: Not on file  . Transportation needs:    Medical: Not on file    Non-medical: Not on file  Tobacco Use  . Smoking status: Former Smoker    Packs/day: 3.00    Years: 30.00    Pack  years: 90.00    Types: Cigarettes    Last attempt to quit: 10/11/1993    Years since quitting: 24.4  . Smokeless tobacco: Former Neurosurgeon  . Tobacco comment: Quit approx 20 years ago  Substance and Sexual Activity  . Alcohol use: No    Alcohol/week: 0.0 oz  . Drug use: No  . Sexual activity: Yes    Birth control/protection: None  Lifestyle  . Physical activity:    Days per week: Not on file    Minutes per session: Not on file  . Stress: Not on file  Relationships  . Social connections:     Talks on phone: Not on file    Gets together: Not on file    Attends religious service: Not on file    Active member of club or organization: Not on file    Attends meetings of clubs or organizations: Not on file    Relationship status: Not on file  . Intimate partner violence:    Fear of current or ex partner: Not on file    Emotionally abused: Not on file    Physically abused: Not on file    Forced sexual activity: Not on file  Other Topics Concern  . Not on file  Social History Narrative  . Not on file    Family History  Problem Relation Age of Onset  . Arthritis Unknown   . Cancer Unknown   . Diabetes Unknown   . Cancer Mother   . Colon cancer Neg Hx     Current Outpatient Medications  Medication Sig Dispense Refill  . amLODipine (NORVASC) 2.5 MG tablet     . aspirin EC 81 MG tablet Take by mouth.    Marland Kitchen atorvastatin (LIPITOR) 40 MG tablet Take by mouth.    . cholecalciferol (VITAMIN D) 1000 units tablet Take 1,000 Units by mouth daily.    Marland Kitchen Cod Liver Oil 1000 MG CAPS Take 1 capsule by mouth daily.    . Cyanocobalamin (VITAMIN B-12) 1000 MCG/15ML LIQD Inject as directed.    . DOCOSAHEXAENOIC ACID PO Take by mouth.    . donepezil (ARICEPT) 10 MG tablet Take 10 mg by mouth at bedtime.   1  . fish oil-omega-3 fatty acids 1000 MG capsule Take 1 g by mouth 2 (two) times daily.     Marland Kitchen gabapentin (NEURONTIN) 100 MG capsule Take 1 capsule (100 mg total) by mouth 3 (three) times daily. 90 capsule 2  . insulin aspart (NOVOLOG) 100 UNIT/ML injection Inject 20-36 Units into the skin See admin instructions. 20 units at lunch time, and 36 units at bedtime    . insulin detemir (LEVEMIR) 100 UNIT/ML injection Inject 24-55 Units into the skin 2 (two) times daily. 55 units each morning, and 24 units at dinner    . metFORMIN (GLUCOPHAGE) 1000 MG tablet Take 1,000 mg by mouth 2 (two) times daily with a meal.      . Multiple Vitamin (MULTIVITAMIN WITH MINERALS) TABS tablet Take 1 tablet by mouth  daily.    Marland Kitchen omega-3 acid ethyl esters (LOVAZA) 1 g capsule Take by mouth.    . ONGLYZA 5 MG TABS tablet Take 5 mg by mouth every morning.     . Vitamins/Minerals TABS Take by mouth.     No current facility-administered medications for this visit.     No Known Allergies  REVIEW OF SYSTEMS (negative unless checked):   Cardiac:  []  Chest pain or chest pressure? []  Shortness of breath upon  activity? []  Shortness of breath when lying flat? []  Irregular heart rhythm?  Vascular:  [x]  Pain in calf, thigh, or hip brought on by walking? [x]  Pain in feet at night that wakes you up from your sleep? []  Blood clot in your veins? [x]  Leg swelling?  Pulmonary:  []  Oxygen at home? []  Productive cough? []  Wheezing?  Neurologic:  [x]  Sudden weakness in arms or legs? []  Sudden numbness in arms or legs? [x]  Sudden onset of difficult speaking or slurred speech? []  Temporary loss of vision in one eye? [x]  Problems with dizziness?  Gastrointestinal:  []  Blood in stool? []  Vomited blood?  Genitourinary:  []  Burning when urinating? []  Blood in urine?  Psychiatric:  []  Major depression  Hematologic:  []  Bleeding problems? []  Problems with blood clotting?  Dermatologic:  []  Rashes or ulcers?  Constitutional:  []  Fever or chills?  Ear/Nose/Throat:  []  Change in hearing? []  Nose bleeds? []  Sore throat?  Musculoskeletal:  []  Back pain? []  Joint pain? []  Muscle pain?   For VQI Use Only   PRE-ADM LIVING Home  AMB STATUS Ambulatory  CAD Sx None  PRIOR CHF None  STRESS TEST No   Physical Examination     Vitals:   03/03/18 1114  BP: 124/72  Pulse: 86  Resp: 20  SpO2: 96%  Weight: 194 lb 4.8 oz (88.1 kg)  Height: 5' 7.5" (1.715 m)   Body mass index is 29.98 kg/m.  General Alert, O x 3, WD, NAD  Head /AT,    Ear/Nose/ Throat Hearing grossly intact, nares without erythema or drainage, oropharynx without Erythema or Exudate, Mallampati score: 3,   Eyes PERRLA,  EOMI,    Neck Supple, mid-line trachea,    Pulmonary Sym exp, good B air movt, CTA B  Cardiac RRR, Nl S1, S2, no Murmurs, No rubs, No S3,S4  Vascular Vessel Right Left  Radial Palpable Palpable  Brachial Palpable Palpable  Carotid Palpable, No Bruit Palpable, No Bruit  Aorta Not palpable due to pannus N/A  Femoral Palpable Palpable  Popliteal Not palpable Not palpable  PT Not palpable Not palpable  DP Not palpable Not palpable    Gastro- intestinal soft, non-distended, non-tender to palpation, No guarding or rebound, no HSM, no masses, no CVAT B, No palpable prominent aortic pulse,    Musculo- skeletal M/S 5/5 throughout  , Extremities without ischemic changes  , Non-pitting edema present: B 1+, Varicosities present: L>R, No Lipodermatosclerosis present  Neurologic Cranial nerves 2-12 intact , Pain and light touch intact in extremities , Motor exam as listed above  Psychiatric Judgement intact, Mood & affect appropriate for pt's clinical situation  Dermatologic See M/S exam for extremity exam, No rashes otherwise noted  Lymphatic  Palpable lymph nodes: None    Non-Invasive Vascular imaging   BLE ABI (01/08/18)  R:   ABI: 0.71,   PT: mono  DP: mono  L:   ABI: 0.61,   PT: bi  DP: mono   Outside Studies/Documentation   4 pages of outside documents were reviewed including: outpatient PCP chart.    Medical Decision Making   Cire Clute is a 84 y.o. male who presents with: BLE intermittent claudication., BLE CVI (C3)   Based on this patient's history and physical exam, I recommend: maximal medical management  I discussed with the patient the natural history of intermittent claudication: 75% of patients have stable or improved symptoms in a year an only 2% require amputation. Eventually 20% may require intervention in  a year.  I discussed in depth with the patient the nature of atherosclerosis, and emphasized the importance of maximal medical management including  strict control of blood pressure, blood glucose, and lipid levels, antiplatelet agent, obtaining regular exercise, and cessation of smoking.    The patient is aware that without maximal medical management the underlying atherosclerotic disease process will progress, limiting the benefit of any interventions.  I discussed in depth with the patient a walking plan and how to execute such.  The patient is interested in starting Pletal.  He was presecribed Pletal 100 mg PO BID  The patient is currently on a statin: Lipitor.   The patient is currently on an anti-platelet: ASA.  Thank you for allowing [place] to participate in this patient's care.   Leonides Sake, MD, FACS [facility] Office: [phone] Pager: [phone]  03/03/2018, 11:38 AM

## 2018-03-22 ENCOUNTER — Other Ambulatory Visit: Payer: Self-pay

## 2018-03-22 DIAGNOSIS — I70219 Atherosclerosis of native arteries of extremities with intermittent claudication, unspecified extremity: Secondary | ICD-10-CM

## 2018-04-21 ENCOUNTER — Encounter: Payer: Self-pay | Admitting: Nurse Practitioner

## 2018-04-21 ENCOUNTER — Ambulatory Visit: Payer: Medicare Other | Admitting: Nurse Practitioner

## 2018-04-21 VITALS — BP 141/76 | HR 100 | Temp 97.3°F | Ht 70.0 in | Wt 190.6 lb

## 2018-04-21 DIAGNOSIS — R197 Diarrhea, unspecified: Secondary | ICD-10-CM

## 2018-04-21 DIAGNOSIS — R1084 Generalized abdominal pain: Secondary | ICD-10-CM | POA: Diagnosis not present

## 2018-04-21 DIAGNOSIS — R1030 Lower abdominal pain, unspecified: Secondary | ICD-10-CM

## 2018-04-21 NOTE — Patient Instructions (Signed)
1. When you collect your stool samples bring them to the lab. 2. We will call you when we get the results from your stool samples. 3. If you do not have an infection we will send in a medication to help with your diarrhea. 4. Return for follow-up in 2 months. 5. Call us if you have any questions or concerns.  At [facility] we value your feedback. You may receive a survey about your visit today. Please share your experience as we strive to create trusting relationships with our patients to provide genuine, compassionate, quality care.  We appreciate your understanding and patience as we review any laboratory studies, imaging, and other diagnostic tests that are ordered as we care for you. Our office policy is 5 business days for review of these results, and any emergent or urgent results are addressed in a timely manner for your best interest. If you do not hear from our office in 1 week, please contact us.   We also encourage the use of MyChart, which contains your medical information for your review as well. If you are not enrolled in this feature, an access code is on this after visit summary for your convenience. Thank you for allowing us to be involved in your care.  It was great to see you today!  I hope you have a great Fall!!

## 2018-04-21 NOTE — Progress Notes (Signed)
Primary Care Physician:  Salley Scarlet, MD Primary Gastroenterologist:  Dr. Jena Gauss  Chief Complaint  Patient presents with  . Diarrhea    3 times per day    HPI:   [name] is a 84 y.o. male who presents for evaluation of diarrhea.  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  Her eosinophilic colitis was discovered on colonoscopy completed due to persistent diarrhea.  He was given a 4-week course of Entocort but only took 2 weeks worth.  However, his symptoms were much improved.  At his last visit he was doing well with no return of diarrhea.  Has a bowel movement 1-2 times a day which is consistent with [place] 4.  No other GI symptoms.  Recommended no further treatment, return for follow-up as needed.  Today he states he's been well overall. Entocort worked well last time and no problems until a few months ago. Diarrhea a few months ago or as much as 6 months ago. Stools are watery. No sick contacts, no recent hospitalization. Has about 3 watery stools a day. Denies hematochezia, melena. Does have intermittent abdominal pain, sometimes improves with a bowel movement. Denies N/V. Has lost some weight recently per his wife, but he has cut back on his intake due to DM. Denies chest pain, dyspnea, dizziness, lightheadedness, syncope, near syncope. Denies any other upper or lower GI symptoms.  Is on Metformin but has been a a "number of years" (at least 10 years).  Past Medical History:  Diagnosis Date  . Arthritis   . Dementia    early stages  . Diabetes mellitus   . Eosinophilic colitis   . GERD (gastroesophageal reflux disease)   . HOH (hard of hearing)   . Hypercholesterolemia   . Hypertension     Past Surgical History:  Procedure Laterality Date  . CATARACT EXTRACTION W/PHACO Left 03/07/2014   Procedure: CATARACT EXTRACTION PHACO AND INTRAOCULAR LENS PLACEMENT LEFT EYE CDE=7.11;  Surgeon: Loraine Leriche T. Nile Riggs, MD;  Location: [facility];  Service: Ophthalmology;  Laterality: Left;  . CATARACT EXTRACTION W/PHACO Right 03/14/2014   Procedure: CATARACT EXTRACTION PHACO AND INTRAOCULAR LENS PLACEMENT RIGHT EYE CDE=10.62;  Surgeon: Loraine Leriche T. Nile Riggs, MD;  Location: [facility];  Service: Ophthalmology;  Laterality: Right;  . COLONOSCOPY  2009   Dr. Darrick Penna: pancolonic diverticulosis, internal hemorrhoids  . COLONOSCOPY N/A 03/29/2015   RMR: inflammatory changes in the proximal rectum, colon and  terminal ileum suspicious for inflammatory bowel disease. ie. primarily Crohns colitis. Status post biopsy as described. concomitant NSAID exposure could excaerbate pre-existing inflammatory bowel disease but I do not think todays finding are primarly related to recent ibuprofen use.   Marland Kitchen KNEE ARTHROSCOPY Left   . RESECTION DISTAL CLAVICAL Left 10/15/2012   Procedure: RESECTION DISTAL CLAVICAL;  Surgeon: Vickki Hearing, MD;  Location: [facility];  Service: Orthopedics;  Laterality: Left;  . SHOULDER ACROMIOPLASTY Left 10/15/2012   Procedure: SHOULDER ACROMIOPLASTY;  Surgeon: Vickki Hearing, MD;  Location: [facility];  Service: Orthopedics;  Laterality: Left;  . SHOULDER ARTHROSCOPY WITH OPEN ROTATOR CUFF REPAIR Left 10/15/2012   Procedure: SHOULDER ARTHROSCOPY WITH OPEN ROTATOR CUFF REPAIR;  Surgeon: Vickki Hearing, MD;  Location: [facility];  Service: Orthopedics;  Laterality: Left;  open at 0912; with bicep tendon debridement;   . SHOULDER OPEN ROTATOR CUFF REPAIR Right 08/20/2017   Procedure: ROTATOR CUFF REPAIR SHOULDER OPEN with removal of distal clavicle;  Surgeon: Fuller Canada  E, MD;  Location: [facility];  Service: Orthopedics;  Laterality: Right;  . TONSILLECTOMY      Current Outpatient Medications  Medication Sig Dispense Refill  . amLODipine (NORVASC) 2.5 MG tablet     . aspirin EC 81 MG tablet Take by mouth.    Marland Kitchen atorvastatin (LIPITOR) 40 MG tablet Take by mouth.    . cholecalciferol (VITAMIN D) 1000 units tablet Take 1,000 Units by  mouth daily.    . cilostazol (PLETAL) 100 MG tablet Take 1 tablet (100 mg total) by mouth 2 (two) times daily before a meal. 60 tablet 11  . Cod Liver Oil 1000 MG CAPS Take 1 capsule by mouth daily.    . Cyanocobalamin (VITAMIN B-12) 1000 MCG/15ML LIQD Once a month    . Cyanocobalamin (VITAMIN B12 PO) Take 1 tablet by mouth daily.    . DOCOSAHEXAENOIC ACID PO Take by mouth.    . donepezil (ARICEPT) 10 MG tablet Take 10 mg by mouth at bedtime.   1  . empagliflozin (JARDIANCE) 10 MG TABS tablet Take 10 mg by mouth daily.    . fish oil-omega-3 fatty acids 1000 MG capsule Take 1 g by mouth 2 (two) times daily.     Marland Kitchen gabapentin (NEURONTIN) 100 MG capsule Take 1 capsule (100 mg total) by mouth 3 (three) times daily. 90 capsule 2  . insulin aspart (NOVOLOG) 100 UNIT/ML injection Inject 20-36 Units into the skin See admin instructions. 20 units at lunch time, and 36 units at dinner    . insulin detemir (LEVEMIR) 100 UNIT/ML injection Inject 24-55 Units into the skin 2 (two) times daily. 55 units each morning, and 24 units at bedtime    . metFORMIN (GLUCOPHAGE) 1000 MG tablet Take 1,000 mg by mouth 2 (two) times daily with a meal.      . Multiple Vitamin (MULTIVITAMIN WITH MINERALS) TABS tablet Take 1 tablet by mouth daily.    Marland Kitchen omega-3 acid ethyl esters (LOVAZA) 1 g capsule Take by mouth.    . Vitamins/Minerals TABS Take by mouth.     No current facility-administered medications for this visit.     Allergies as of 04/21/2018  . (No Known Allergies)    Family History  Problem Relation Age of Onset  . Arthritis Unknown   . Cancer Unknown   . Diabetes Unknown   . Cancer Mother   . Colon cancer Neg Hx     Social History   Socioeconomic History  . Marital status: Married    Spouse name: Not on file  . Number of children: Not on file  . Years of education: Not on file  . Highest education level: Not on file  Occupational History  . Not on file  Social Needs  . Financial resource strain:  Not on file  . Food insecurity:    Worry: Not on file    Inability: Not on file  . Transportation needs:    Medical: Not on file    Non-medical: Not on file  Tobacco Use  . Smoking status: Former Smoker    Packs/day: 3.00    Years: 30.00    Pack years: 90.00    Types: Cigarettes    Last attempt to quit: 10/11/1993    Years since quitting: 24.5  . Smokeless tobacco: Former Neurosurgeon  . Tobacco comment: Quit approx 20 years ago  Substance and Sexual Activity  . Alcohol use: No    Alcohol/week: 0.0 standard drinks  . Drug use: No  .  Sexual activity: Yes    Birth control/protection: None  Lifestyle  . Physical activity:    Days per week: Not on file    Minutes per session: Not on file  . Stress: Not on file  Relationships  . Social connections:    Talks on phone: Not on file    Gets together: Not on file    Attends religious service: Not on file    Active member of club or organization: Not on file    Attends meetings of clubs or organizations: Not on file    Relationship status: Not on file  . Intimate partner violence:    Fear of current or ex partner: Not on file    Emotionally abused: Not on file    Physically abused: Not on file    Forced sexual activity: Not on file  Other Topics Concern  . Not on file  Social History Narrative  . Not on file    Review of Systems: General: Negative for anorexia, weight loss, fever, chills, fatigue, weakness. ENT: Negative for hoarseness, difficulty swallowing , nasal congestion. CV: Negative for chest pain, angina, palpitations, dyspnea on exertion, peripheral edema.  Respiratory: Negative for dyspnea at rest, dyspnea on exertion, cough, sputum, wheezing.  GI: See history of present illness. MS: Negative for joint pain, low back pain.  Neuro: Admits balnace issues.  Endo: Negative for unusual weight change.  Heme: Negative for bruising or bleeding. Allergy: Negative for rash or hives.    Physical Exam: BP (!) 141/76   Pulse  100   Temp (!) 97.3 F (36.3 C) (Oral)   Ht 5\' 10"  (1.778 m)   Wt 190 lb 9.6 oz (86.5 kg)   BMI 27.35 kg/m  General:   Alert and oriented. Pleasant and cooperative. Well-nourished and well-developed.  Head:  Normocephalic and atraumatic. Eyes:  Without icterus, sclera clear and conjunctiva pink.  Ears:  Hard of hearing. Cardiovascular:  S1, S2 present with 3/6 systolic murmur appreciated. Extremities without clubbing or edema. Respiratory:  Clear to auscultation bilaterally. No wheezes, rales, or rhonchi. No distress.  Gastrointestinal:  +BS, soft, non-tender and non-distended. No HSM noted. No guarding or rebound. No masses appreciated.  Rectal:  Deferred  Musculoskalatal:  Symmetrical without gross deformities. Neurologic:  Alert and oriented x4;  grossly normal neurologically. Psych:  Alert and cooperative. Normal mood and affect. Heme/Lymph/Immune: No excessive bruising noted.    04/21/2018 9:09 AM   Disclaimer: This note was dictated with voice recognition software. Similar sounding words can inadvertently be transcribed and may not be corrected upon review.

## 2018-04-22 LAB — GASTROINTESTINAL PATHOGEN PANEL PCR
C. difficile Tox A/B, PCR: NOT DETECTED
Campylobacter, PCR: NOT DETECTED
Cryptosporidium, PCR: NOT DETECTED
E COLI (ETEC) LT/ST, PCR: NOT DETECTED
E COLI (STEC) STX1/STX2, PCR: NOT DETECTED
E COLI 0157, PCR: NOT DETECTED
Giardia lamblia, PCR: NOT DETECTED
NOROVIRUS, PCR: NOT DETECTED
Rotavirus A, PCR: NOT DETECTED
SALMONELLA, PCR: NOT DETECTED
SHIGELLA, PCR: NOT DETECTED

## 2018-04-22 LAB — C. DIFFICILE GDH AND TOXIN A/B
GDH ANTIGEN: NOT DETECTED
MICRO NUMBER: 91122995
SPECIMEN QUALITY: ADEQUATE
TOXIN A AND B: NOT DETECTED

## 2018-04-23 ENCOUNTER — Other Ambulatory Visit: Payer: Self-pay | Admitting: Nurse Practitioner

## 2018-04-23 DIAGNOSIS — K52839 Microscopic colitis, unspecified: Secondary | ICD-10-CM

## 2018-04-23 DIAGNOSIS — R197 Diarrhea, unspecified: Secondary | ICD-10-CM

## 2018-04-23 DIAGNOSIS — R109 Unspecified abdominal pain: Secondary | ICD-10-CM | POA: Insufficient documentation

## 2018-04-23 MED ORDER — BUDESONIDE 3 MG PO CPEP: ORAL_CAPSULE | ORAL | 0 refills | Status: AC

## 2018-04-23 NOTE — Assessment & Plan Note (Signed)
The patient has a history of collagenous colitis.  This is previously treated with Entocort which worked well.  He began having diarrhea 6 months ago with watery stools.  Denies sick contacts or recent hospital exposure.  Having about 3 watery stools a day.  No other significant GI symptoms.  I will check stool studies to see if he has some sort of infection.  If his stool studies are normal I will give him a course of Entocort for possible recurrent collagenous colitis.  Follow-up in 2 months.

## 2018-04-23 NOTE — Assessment & Plan Note (Signed)
Some mild abdominal pain which generally improves after bowel movement.  Is intermittent and not severe.  In the setting of diarrhea I will check stool studies.  Possible collagenous colitis recurrence, Entocort worked well last time.  Further management as per below.  Follow-up in 2 months.

## 2018-04-26 NOTE — Progress Notes (Signed)
cc'd to pcp 

## 2018-05-03 ENCOUNTER — Telehealth: Payer: Self-pay

## 2018-05-03 NOTE — Telephone Encounter (Signed)
Spouse called with an report on how pt was doing since starting Entocort. Pt has been on medication x 1 week and today his bowel movement was normal. Pt doesn't report diarrhea. Spouse wanted to update EG.

## 2018-05-04 NOTE — Telephone Encounter (Signed)
Great news! Take Entocort to completion. Follow-up as scheduled. Call with any questions or problems.

## 2018-05-04 NOTE — Telephone Encounter (Signed)
Noted. Pt will call back with any concerns or questions.

## 2018-05-11 ENCOUNTER — Telehealth: Payer: Self-pay | Admitting: Gastroenterology

## 2018-05-11 DIAGNOSIS — R197 Diarrhea, unspecified: Secondary | ICD-10-CM

## 2018-05-11 NOTE — Telephone Encounter (Signed)
[phone] patient wife called and said he is still having lots of issues with diarrhea,  The medicine eric put him on is not working.  He is also having some stomach pain

## 2018-05-11 NOTE — Telephone Encounter (Signed)
Spoke with spouse. Pts bowels were improving the first week on Entocort. Now pt is having stomach pain, watery diarrhea and loss of appetite. Pt takes 3 3mg  tablets daily and having 3 bowel movements daily.

## 2018-05-12 MED ORDER — DICYCLOMINE HCL 10 MG PO CAPS: 10.0000 mg | ORAL_CAPSULE | Freq: Three times a day (TID) | ORAL | 0 refills | Status: DC | PRN

## 2018-05-12 NOTE — Addendum Note (Signed)
Addended by: Delane Ginger, Laquitta Dominski A on: 05/12/2018 04:27 PM   Modules accepted: Orders

## 2018-05-12 NOTE — Telephone Encounter (Signed)
Continue Entocort. I will put in an order for Bentyl. Drink plenty of fluids to stay hydrated.  Has he had any antibiotics recently?  Call and check on him early next week.

## 2018-05-12 NOTE — Telephone Encounter (Signed)
Spoke with spouse. Pt hasn't been on any recent antibiotics and will continue the Entocort. Pt will start Bentyl. Spouse will give an update with pt early next week.

## 2018-05-13 NOTE — Telephone Encounter (Signed)
Noted. No further recommendations at this time. 

## 2018-05-24 NOTE — Telephone Encounter (Signed)
Spouse called back and said pt is still having watery stool. He's having 2-3 bowel movements day and takes the Entocort and Bentyl as directed. He reports some stomach pain varies from very severe to mild to moderate.

## 2018-06-17 ENCOUNTER — Encounter: Payer: Self-pay | Admitting: Nurse Practitioner

## 2018-06-17 ENCOUNTER — Ambulatory Visit: Payer: Medicare Other | Admitting: Nurse Practitioner

## 2018-06-17 VITALS — BP 133/65 | HR 83 | Temp 97.0°F | Ht 70.0 in | Wt 182.4 lb

## 2018-06-17 DIAGNOSIS — K5282 Eosinophilic colitis: Secondary | ICD-10-CM

## 2018-06-17 DIAGNOSIS — R197 Diarrhea, unspecified: Secondary | ICD-10-CM | POA: Diagnosis not present

## 2018-06-17 NOTE — Progress Notes (Signed)
Referring Provider: Salley Scarlet, MD Primary Care Physician:  Salley Scarlet, MD Primary GI:  Dr. Darrick Penna  Chief Complaint  Patient presents with  . Diarrhea    2+ times per day, watery stool, no blood    HPI:   [name] is a 84 y.o. male who presents for follow-up on diarrhea.  Patient was last seen in our office 04/21/2018 for lower abdominal pain, generalized abdominal pain, and diarrhea.  History of eosinophilic colitis in 2016 treated with Entocort.  At his last visit he admitted Entocort worked well last time and no problems until a few months ago when he began having diarrhea.  Stools are watery.  No sick contacts or recent hospitalizations, 3 stools a day.  Intermittent abdominal pain which sometimes improved with a bowel movement.  Some weight loss per his wife but admits he has cut back on intake due to diabetes.  No other GI symptoms.  On metformin but has been on this for at least 10 years.  Recommended stool studies, symptomatic management initially if no infection, follow-up in 2 months.  GI pathogen panel was negative.  The patient was started on Entocort and initially had a good response. Taking Entocort and Bentyl as directed.  Today he states he's doing about the same. Finished Bentyl, still on Entocort. His wife states a few days after starting the Entocort he began having diarrhea again. Currently having about 2-4 loose stools a day. Still on last few days of Entocort, when he ran out of Bentyl he did not notice a change in his symptoms. Denies abdominal pain, N/V, hematochezia, melena, fever, chills. States he has been losing some weight. Objectively he has lost about 8 lbs in the past 2 months. Denies chest pain, dyspnea, dizziness, lightheadedness, syncope, near syncope. Denies any other upper or lower GI symptoms.  Does take ASA 81 mg daily. Takes Aleve "once in a while, not very often."  He has previously tried Imodium without relief.  Past Medical  History:  Diagnosis Date  . Arthritis   . Dementia (HCC)    early stages  . Diabetes mellitus   . Eosinophilic colitis   . GERD (gastroesophageal reflux disease)   . HOH (hard of hearing)   . Hypercholesterolemia   . Hypertension     Past Surgical History:  Procedure Laterality Date  . CATARACT EXTRACTION W/PHACO Left 03/07/2014   Procedure: CATARACT EXTRACTION PHACO AND INTRAOCULAR LENS PLACEMENT LEFT EYE CDE=7.11;  Surgeon: Loraine Leriche T. Nile Riggs, MD;  Location: [facility];  Service: Ophthalmology;  Laterality: Left;  . CATARACT EXTRACTION W/PHACO Right 03/14/2014   Procedure: CATARACT EXTRACTION PHACO AND INTRAOCULAR LENS PLACEMENT RIGHT EYE CDE=10.62;  Surgeon: Loraine Leriche T. Nile Riggs, MD;  Location: [facility];  Service: Ophthalmology;  Laterality: Right;  . COLONOSCOPY  2009   Dr. Darrick Penna: pancolonic diverticulosis, internal hemorrhoids  . COLONOSCOPY N/A 03/29/2015   RMR: inflammatory changes in the proximal rectum, colon and  terminal ileum suspicious for inflammatory bowel disease. ie. primarily Crohns colitis. Status post biopsy as described. concomitant NSAID exposure could excaerbate pre-existing inflammatory bowel disease but I do not think todays finding are primarly related to recent ibuprofen use.   Marland Kitchen KNEE ARTHROSCOPY Left   . RESECTION DISTAL CLAVICAL Left 10/15/2012   Procedure: RESECTION DISTAL CLAVICAL;  Surgeon: Vickki Hearing, MD;  Location: [facility];  Service: Orthopedics;  Laterality: Left;  . SHOULDER ACROMIOPLASTY Left 10/15/2012   Procedure: SHOULDER ACROMIOPLASTY;  Surgeon: Vickki Hearing, MD;  Location: [facility];  Service: Orthopedics;  Laterality: Left;  . SHOULDER ARTHROSCOPY WITH OPEN ROTATOR CUFF REPAIR Left 10/15/2012   Procedure: SHOULDER ARTHROSCOPY WITH OPEN ROTATOR CUFF REPAIR;  Surgeon: Vickki Hearing, MD;  Location: [facility];  Service: Orthopedics;  Laterality: Left;  open at 0912; with bicep tendon debridement;   . SHOULDER OPEN ROTATOR CUFF REPAIR Right 08/20/2017    Procedure: ROTATOR CUFF REPAIR SHOULDER OPEN with removal of distal clavicle;  Surgeon: Vickki Hearing, MD;  Location: [facility];  Service: Orthopedics;  Laterality: Right;  . TONSILLECTOMY      Current Outpatient Medications  Medication Sig Dispense Refill  . amLODipine (NORVASC) 2.5 MG tablet     . aspirin EC 81 MG tablet Take by mouth.    Marland Kitchen atorvastatin (LIPITOR) 40 MG tablet Take by mouth.    . budesonide (ENTOCORT EC) 3 MG 24 hr capsule Take 3 capsules (9 mg total) by mouth daily for 56 days, THEN 2 capsules (6 mg total) daily for 14 days, THEN 1 capsule (3 mg total) daily for 14 days. 210 capsule 0  . cholecalciferol (VITAMIN D) 1000 units tablet Take 1,000 Units by mouth daily.    . cilostazol (PLETAL) 100 MG tablet Take 1 tablet (100 mg total) by mouth 2 (two) times daily before a meal. 60 tablet 11  . Cod Liver Oil 1000 MG CAPS Take 1 capsule by mouth daily.    . Cyanocobalamin (VITAMIN B-12) 1000 MCG/15ML LIQD Once a month    . Cyanocobalamin (VITAMIN B12 PO) Take 1 tablet by mouth daily.    Marland Kitchen dicyclomine (BENTYL) 10 MG capsule Take 1 capsule (10 mg total) by mouth 3 (three) times daily as needed for spasms (or diarrhea). 90 capsule 0  . DOCOSAHEXAENOIC ACID PO Take by mouth.    . donepezil (ARICEPT) 10 MG tablet Take 10 mg by mouth at bedtime.   1  . empagliflozin (JARDIANCE) 10 MG TABS tablet Take 10 mg by mouth daily.    . fish oil-omega-3 fatty acids 1000 MG capsule Take 1 g by mouth 2 (two) times daily.     Marland Kitchen gabapentin (NEURONTIN) 100 MG capsule Take 1 capsule (100 mg total) by mouth 3 (three) times daily. 90 capsule 2  . insulin aspart (NOVOLOG) 100 UNIT/ML injection Inject 20-36 Units into the skin See admin instructions. 20 units at lunch time, and 36 units at dinner    . insulin detemir (LEVEMIR) 100 UNIT/ML injection Inject 24-55 Units into the skin 2 (two) times daily. 55 units each morning, and 24 units at bedtime    . metFORMIN (GLUCOPHAGE) 1000 MG tablet Take 1,000  mg by mouth 2 (two) times daily with a meal.      . Multiple Vitamin (MULTIVITAMIN WITH MINERALS) TABS tablet Take 1 tablet by mouth daily.    Marland Kitchen omega-3 acid ethyl esters (LOVAZA) 1 g capsule Take by mouth.    . Vitamins/Minerals TABS Take by mouth.     No current facility-administered medications for this visit.     Allergies as of 06/17/2018  . (No Known Allergies)    Family History  Problem Relation Age of Onset  . Arthritis Unknown   . Cancer Unknown   . Diabetes Unknown   . Cancer Mother   . Colon cancer Neg Hx     Social History   Socioeconomic History  . Marital status: Married    Spouse name: Not on file  . Number of children: Not on file  .  Years of education: Not on file  . Highest education level: Not on file  Occupational History  . Not on file  Social Needs  . Financial resource strain: Not on file  . Food insecurity:    Worry: Not on file    Inability: Not on file  . Transportation needs:    Medical: Not on file    Non-medical: Not on file  Tobacco Use  . Smoking status: Former Smoker    Packs/day: 3.00    Years: 30.00    Pack years: 90.00    Types: Cigarettes    Last attempt to quit: 10/11/1993    Years since quitting: 24.6  . Smokeless tobacco: Former NeurosurgeonUser  . Tobacco comment: Quit approx 20 years ago  Substance and Sexual Activity  . Alcohol use: No    Alcohol/week: 0.0 standard drinks  . Drug use: No  . Sexual activity: Yes    Birth control/protection: None  Lifestyle  . Physical activity:    Days per week: Not on file    Minutes per session: Not on file  . Stress: Not on file  Relationships  . Social connections:    Talks on phone: Not on file    Gets together: Not on file    Attends religious service: Not on file    Active member of club or organization: Not on file    Attends meetings of clubs or organizations: Not on file    Relationship status: Not on file  Other Topics Concern  . Not on file  Social History Narrative  . Not  on file    Review of Systems: Complete ROS negative except as per HPI.   Physical Exam: BP 133/65   Pulse 83   Temp (!) 97 F (36.1 C) (Oral)   Ht 5\' 10"  (1.778 m)   Wt 182 lb 6.4 oz (82.7 kg)   BMI 26.17 kg/m  General:   Alert and oriented. Pleasant and cooperative. Well-nourished and well-developed.  Eyes:  Without icterus, sclera clear and conjunctiva pink.  Ears:  HOH, hearing aids in place. Cardiovascular:  S1, S2 present without murmurs appreciated. Extremities without clubbing or edema. Respiratory:  Clear to auscultation bilaterally. No wheezes, rales, or rhonchi. No distress.  Gastrointestinal:  +BS, soft, non-tender and non-distended. No HSM noted. No guarding or rebound. No masses appreciated.  Rectal:  Deferred  Musculoskalatal:  Symmetrical without gross deformities. Neurologic:  Alert and oriented x4;  grossly normal neurologically. Psych:  Alert and cooperative. Normal mood and affect. Heme/Lymph/Immune: No excessive bruising noted.    06/17/2018 9:32 AM   Disclaimer: This note was dictated with voice recognition software. Similar sounding words can inadvertently be transcribed and may not be corrected upon review.

## 2018-06-17 NOTE — Assessment & Plan Note (Addendum)
The patient is having diarrhea (worsened), typically twice a day but sometimes as many as 4 times a day.  No abdominal pain, nausea, vomiting, or other associated symptoms.  He does have eosinophilic colitis as per above.  Entocort has not worked as well as it has previously.  Further treatment of eosinophilic colitis is likely etiology of his diarrhea as per above.  Follow-up in 6 weeks.

## 2018-06-17 NOTE — Progress Notes (Signed)
cc'ed to pcp °

## 2018-06-17 NOTE — Patient Instructions (Signed)
1. Finish your Entocort (but desonide). 2. Start taking Singulair, over-the-counter allergy medicine, 10 mg once a day. 3. Store brand of Singulair is acceptable.  You can speak with the pharmacist if you need help locating it. 4. Call us in 2 weeks and let us know if it is helping. 5. Return for follow-up in 6 weeks. 6. Call us if you have any questions or concerns.  At [facility] we value your feedback. You may receive a survey about your visit today. Please share your experience as we strive to create trusting relationships with our patients to provide genuine, compassionate, quality care.  We appreciate your understanding and patience as we review any laboratory studies, imaging, and other diagnostic tests that are ordered as we care for you. Our office policy is 5 business days for review of these results, and any emergent or urgent results are addressed in a timely manner for your best interest. If you do not hear from our office in 1 week, please contact us.   We also encourage the use of MyChart, which contains your medical information for your review as well. If you are not enrolled in this feature, an access code is on this after visit summary for your convenience. Thank you for allowing us to be involved in your care.  It was great to see you today!  I hope you have a great Fall!!

## 2018-06-17 NOTE — Assessment & Plan Note (Signed)
Eosinophilic colitis documented on colonoscopy as per HPI.  The patient at first presentation was treated with Entocort successfully.  He had a recurrence of his diarrhea and another course of Entocort was provided.  He was initially responded well for 2 days but then resumed diarrhea.  At this point I want to hold off on systemic prednisone.  Based on other treatment agents review of research I will start him on Singulair 10 mg daily to see if this helps.  As an aside, he does note he has seasonal allergies with upper respiratory symptoms.  I will have him return for follow-up in 6 weeks.  He is to call us with a progress report in 2 weeks and let us know if the Singulair is helping.  We can make further recommendations if not.

## 2018-07-30 ENCOUNTER — Emergency Department (HOSPITAL_COMMUNITY)
Admission: EM | Admit: 2018-07-30 | Discharge: 2018-07-30 | Disposition: A | Discharge status: Home / Self Care | Payer: Medicare Other | Attending: Emergency Medicine | Admitting: Emergency Medicine

## 2018-07-30 ENCOUNTER — Emergency Department (HOSPITAL_COMMUNITY): Payer: Medicare Other

## 2018-07-30 ENCOUNTER — Other Ambulatory Visit: Payer: Self-pay

## 2018-07-30 ENCOUNTER — Encounter (HOSPITAL_COMMUNITY): Payer: Self-pay

## 2018-07-30 DIAGNOSIS — M25552 Pain in left hip: Secondary | ICD-10-CM | POA: Insufficient documentation

## 2018-07-30 DIAGNOSIS — Z79899 Other long term (current) drug therapy: Secondary | ICD-10-CM | POA: Insufficient documentation

## 2018-07-30 DIAGNOSIS — Z794 Long term (current) use of insulin: Secondary | ICD-10-CM | POA: Diagnosis not present

## 2018-07-30 DIAGNOSIS — M79605 Pain in left leg: Secondary | ICD-10-CM | POA: Diagnosis not present

## 2018-07-30 DIAGNOSIS — I10 Essential (primary) hypertension: Secondary | ICD-10-CM | POA: Insufficient documentation

## 2018-07-30 DIAGNOSIS — Z87891 Personal history of nicotine dependence: Secondary | ICD-10-CM | POA: Insufficient documentation

## 2018-07-30 DIAGNOSIS — Z7982 Long term (current) use of aspirin: Secondary | ICD-10-CM | POA: Insufficient documentation

## 2018-07-30 DIAGNOSIS — F0390 Unspecified dementia without behavioral disturbance: Secondary | ICD-10-CM | POA: Insufficient documentation

## 2018-07-30 MED ORDER — TRAMADOL HCL 50 MG PO TABS: 50.0000 mg | ORAL_TABLET | Freq: Four times a day (QID) | ORAL | 0 refills | Status: DC | PRN
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
## 2018-07-30 NOTE — Discharge Instructions (Addendum)
Follow-[facility] with your family doctor or [facility] in the next couple weeks for recheck

## 2018-08-06 ENCOUNTER — Telehealth: Payer: Self-pay | Admitting: Orthopedic Surgery
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
## 2018-08-17 ENCOUNTER — Encounter: Payer: Self-pay | Admitting: Orthopedic Surgery

## 2018-08-17 ENCOUNTER — Ambulatory Visit: Payer: Medicare Other | Admitting: Orthopedic Surgery

## 2018-08-17 VITALS — BP 135/72 | HR 114 | Ht 70.0 in | Wt 183.0 lb

## 2018-08-17 DIAGNOSIS — M79605 Pain in left leg: Secondary | ICD-10-CM

## 2018-08-17 NOTE — Patient Instructions (Signed)
Use topical medications as needed
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
## 2018-08-31 ENCOUNTER — Ambulatory Visit (HOSPITAL_COMMUNITY)
Admission: RE | Admit: 2018-08-31 | Discharge: 2018-08-31 | Disposition: A | Discharge status: Home / Self Care | Payer: Medicare Other | Source: Ambulatory Visit | Attending: Vascular Surgery | Admitting: Vascular Surgery

## 2018-08-31 ENCOUNTER — Encounter: Payer: Self-pay | Admitting: Vascular Surgery

## 2018-08-31 ENCOUNTER — Other Ambulatory Visit: Payer: Self-pay

## 2018-08-31 ENCOUNTER — Ambulatory Visit: Payer: Medicare Other | Admitting: Vascular Surgery

## 2018-08-31 VITALS — BP 103/58 | HR 112 | Resp 16 | Ht 70.0 in | Wt 183.0 lb

## 2018-08-31 DIAGNOSIS — I70219 Atherosclerosis of native arteries of extremities with intermittent claudication, unspecified extremity: Secondary | ICD-10-CM | POA: Insufficient documentation

## 2018-08-31 MED ORDER — CILOSTAZOL 100 MG PO TABS: 100.0000 mg | ORAL_TABLET | Freq: Two times a day (BID) | ORAL | 11 refills | Status: DC

## 2018-08-31 NOTE — Progress Notes (Signed)
Patient name: [name] MRN: [id number] DOB: 15-Dec-1929 Sex: male  REASON FOR VISIT: 6 month follow-up for claudication  HPI: [name] is a 85 y.o. male with Alzheimer's dementia, hypertension, hyperlipidemia, peripheral vascular disease who presents for six-month follow-up for claudication.  Patient was previously followed by Dr. Imogene Burn and was last seen 6 months ago.  At that time he was started on Pletal which he states he had significant improvement.  Wife provides much of the history but states that he is able to walk around the garden without any significant leg symptoms now and he denies burning in his calves while walking.  He denies any pain in his feet at night that wake him up or any nonhealing wounds.  Overall feels like he is doing better than he was 6 months ago when he was initially evaluated by Dr. Imogene Burn.  He did have noninvasive imaging done today prior to his appointment.  Past Medical History:  Diagnosis Date  . Arthritis   . Dementia (HCC)    early stages  . Diabetes mellitus   . Eosinophilic colitis   . GERD (gastroesophageal reflux disease)   . HOH (hard of hearing)   . Hypercholesterolemia   . Hypertension     Past Surgical History:  Procedure Laterality Date  . CATARACT EXTRACTION W/PHACO Left 03/07/2014   Procedure: CATARACT EXTRACTION PHACO AND INTRAOCULAR LENS PLACEMENT LEFT EYE CDE=7.11;  Surgeon: Loraine Leriche T. Nile Riggs, MD;  Location: [facility];  Service: Ophthalmology;  Laterality: Left;  . CATARACT EXTRACTION W/PHACO Right 03/14/2014   Procedure: CATARACT EXTRACTION PHACO AND INTRAOCULAR LENS PLACEMENT RIGHT EYE CDE=10.62;  Surgeon: Loraine Leriche T. Nile Riggs, MD;  Location: [facility];  Service: Ophthalmology;  Laterality: Right;  . COLONOSCOPY  2009   Dr. Darrick Penna: pancolonic diverticulosis, internal hemorrhoids  . COLONOSCOPY N/A 03/29/2015   RMR: inflammatory changes in the proximal rectum, colon and  terminal ileum suspicious for inflammatory bowel disease. ie. primarily  Crohns colitis. Status post biopsy as described. concomitant NSAID exposure could excaerbate pre-existing inflammatory bowel disease but I do not think todays finding are primarly related to recent ibuprofen use.   Marland Kitchen KNEE ARTHROSCOPY Left   . RESECTION DISTAL CLAVICAL Left 10/15/2012   Procedure: RESECTION DISTAL CLAVICAL;  Surgeon: Vickki Hearing, MD;  Location: [facility];  Service: Orthopedics;  Laterality: Left;  . SHOULDER ACROMIOPLASTY Left 10/15/2012   Procedure: SHOULDER ACROMIOPLASTY;  Surgeon: Vickki Hearing, MD;  Location: [facility];  Service: Orthopedics;  Laterality: Left;  . SHOULDER ARTHROSCOPY WITH OPEN ROTATOR CUFF REPAIR Left 10/15/2012   Procedure: SHOULDER ARTHROSCOPY WITH OPEN ROTATOR CUFF REPAIR;  Surgeon: Vickki Hearing, MD;  Location: [facility];  Service: Orthopedics;  Laterality: Left;  open at 0912; with bicep tendon debridement;   . SHOULDER OPEN ROTATOR CUFF REPAIR Right 08/20/2017   Procedure: ROTATOR CUFF REPAIR SHOULDER OPEN with removal of distal clavicle;  Surgeon: Vickki Hearing, MD;  Location: [facility];  Service: Orthopedics;  Laterality: Right;  . TONSILLECTOMY      Family History  Problem Relation Age of Onset  . Arthritis Other   . Cancer Other   . Diabetes Other   . Cancer Mother   . Colon cancer Neg Hx     SOCIAL HISTORY: Social History   Tobacco Use  . Smoking status: Former Smoker    Packs/day: 3.00    Years: 30.00    Pack years: 90.00    Types: Cigarettes    Last attempt to quit: 10/11/1993  Years since quitting: 24.9  . Smokeless tobacco: Former NeurosurgeonUser  . Tobacco comment: Quit approx 20 years ago  Substance Use Topics  . Alcohol use: No    Alcohol/week: 0.0 standard drinks    No Known Allergies  Current Outpatient Medications  Medication Sig Dispense Refill  . amLODipine (NORVASC) 2.5 MG tablet     . aspirin EC 81 MG tablet Take by mouth.    Marland Kitchen. atorvastatin (LIPITOR) 40 MG tablet Take by mouth.    . cholecalciferol (VITAMIN D)  1000 units tablet Take 1,000 Units by mouth daily.    . cilostazol (PLETAL) 100 MG tablet Take 1 tablet (100 mg total) by mouth 2 (two) times daily before a meal. 60 tablet 11  . Cod Liver Oil 1000 MG CAPS Take 1 capsule by mouth daily.    . Cyanocobalamin (VITAMIN B-12) 1000 MCG/15ML LIQD Once a month    . Cyanocobalamin (VITAMIN B12 PO) Take 1 tablet by mouth daily.    Marland Kitchen. dicyclomine (BENTYL) 10 MG capsule Take 1 capsule (10 mg total) by mouth 3 (three) times daily as needed for spasms (or diarrhea). 90 capsule 0  . DOCOSAHEXAENOIC ACID PO Take by mouth.    . donepezil (ARICEPT) 10 MG tablet Take 10 mg by mouth at bedtime.   1  . empagliflozin (JARDIANCE) 10 MG TABS tablet Take 10 mg by mouth daily.    . fish oil-omega-3 fatty acids 1000 MG capsule Take 1 g by mouth 2 (two) times daily.     Marland Kitchen. gabapentin (NEURONTIN) 100 MG capsule Take 1 capsule (100 mg total) by mouth 3 (three) times daily. 90 capsule 2  . insulin aspart (NOVOLOG) 100 UNIT/ML injection Inject 20-36 Units into the skin See admin instructions. 20 units at lunch time, and 36 units at dinner    . insulin detemir (LEVEMIR) 100 UNIT/ML injection Inject 24-55 Units into the skin 2 (two) times daily. 55 units each morning, and 24 units at bedtime    . metFORMIN (GLUCOPHAGE) 1000 MG tablet Take 1,000 mg by mouth 2 (two) times daily with a meal.      . Multiple Vitamin (MULTIVITAMIN WITH MINERALS) TABS tablet Take 1 tablet by mouth daily.    Marland Kitchen. omega-3 acid ethyl esters (LOVAZA) 1 g capsule Take by mouth.    . traMADol (ULTRAM) 50 MG tablet Take 1 tablet (50 mg total) by mouth every 6 (six) hours as needed. 20 tablet 0  . Vitamins/Minerals TABS Take by mouth.     No current facility-administered medications for this visit.     REVIEW OF SYSTEMS:  [X]  denotes positive finding, [ ]  denotes negative finding Cardiac  Comments:  Chest pain or chest pressure:    Shortness of breath upon exertion:    Short of breath when lying flat:      Irregular heart rhythm:        Vascular    Pain in calf, thigh, or hip brought on by ambulation:    Pain in feet at night that wakes you up from your sleep:     Blood clot in your veins:    Leg swelling:         Pulmonary    Oxygen at home:    Productive cough:     Wheezing:         Neurologic    Sudden weakness in arms or legs:     Sudden numbness in arms or legs:     Sudden onset of difficulty  speaking or slurred speech:    Temporary loss of vision in one eye:     Problems with dizziness:  x       Gastrointestinal    Blood in stool:     Vomited blood:         Genitourinary    Burning when urinating:     Blood in urine:        Psychiatric    Major depression:         Hematologic    Bleeding problems:    Problems with blood clotting too easily:        Skin    Rashes or ulcers:        Constitutional    Fever or chills:      PHYSICAL EXAM: Vitals:   08/31/18 1335  BP: (!) 103/58  Pulse: (!) 112  Resp: 16  SpO2: 96%  Weight: 183 lb (83 kg)  Height: 5\' 10"  (1.778 m)    GENERAL: The patient is a well-nourished male, in no acute distress. The vital signs are documented above. CARDIAC: There is a regular rate and rhythm.  VASCULAR:  2+ radial pulse palpable bilateral upper extremities 1+ right femoral pulse, 2+ left femoral pulse palpable No palpable pedal pulses, monophasic DP/PT signals PULMONARY: There is good air exchange bilaterally without wheezing or rales. ABDOMEN: Soft and non-tender with normal pitched bowel sounds.  MUSCULOSKELETAL: There are no major deformities or cyanosis. NEUROLOGIC: No focal weakness or paresthesias are detected.   DATA:   Noninvasive imaging shows an ABI of 0.92 with a monophasic waveform in the right and an ABI 0.72 on the left with monophasic waveform  Assessment/Plan:  This 85 year old male with peripheral vascular disease and intermittent claudication that was previously followed by Dr. Imogene Burn.  Since starting the  Pletal 6 months ago has had improvement in his symptoms and he is now able to walk around the garden without any significant leg complaints.  His noninvasive imaging certainly suggests infrainguinal disease given he does have femoral pulses on exam but in the absence of significant symptoms I do not see any role for intervention at this time.  I refilled his prescription for Pletal for another year and offered him follow-up in 1 year with repeat ABIs at that time.   Cephus Shelling, MD [facility] Office: [phone] Pager: [phone]

## 2018-09-02 ENCOUNTER — Encounter: Payer: Self-pay | Admitting: Gastroenterology

## 2018-09-02 ENCOUNTER — Ambulatory Visit (INDEPENDENT_AMBULATORY_CARE_PROVIDER_SITE_OTHER): Payer: Medicare Other | Admitting: Gastroenterology

## 2018-09-02 ENCOUNTER — Encounter: Payer: Self-pay | Admitting: Nurse Practitioner

## 2018-09-02 ENCOUNTER — Ambulatory Visit: Payer: Medicare Other | Admitting: Nurse Practitioner

## 2018-09-02 VITALS — BP 112/64 | HR 103 | Temp 97.2°F | Ht 70.0 in | Wt 185.6 lb

## 2018-09-02 DIAGNOSIS — K529 Noninfective gastroenteritis and colitis, unspecified: Secondary | ICD-10-CM | POA: Diagnosis not present

## 2018-09-02 DIAGNOSIS — K5282 Eosinophilic colitis: Secondary | ICD-10-CM

## 2018-09-02 DIAGNOSIS — Z5329 Procedure and treatment not carried out because of patient's decision for other reasons: Secondary | ICD-10-CM

## 2018-09-02 NOTE — Patient Instructions (Signed)
Your health issues we discussed today were:   Diarrhea: 1. Start taking probiotics which are available over-the-counter 2. You can use Metamucil fiber per the package instructions to see if this helps. 3. If your diarrhea worsens on Metamucil, stop using it 4. You can continue to use Imodium if this helps 5. I will discuss your situation with Dr. Jena Gauss so we can make a plan for the next step  Overall I recommend:  1. Follow-up in 2 months 2. Call us if you have any questions or concerns

## 2018-09-02 NOTE — Progress Notes (Deleted)
Subjective:    Patient ID: [name], male    DOB: 1929/09/30, 85 y.o.   MRN: [id number]  [name], FNP   HPI   PT DENIES FEVER, CHILLS, HEMATOCHEZIA, HEMATEMESIS, nausea, vomiting, melena, diarrhea, CHEST PAIN, SHORTNESS OF BREATH,  CHANGE IN BOWEL IN HABITS, constipation, abdominal pain, problems swallowing, problems with sedation, heartburn or indigestion.  Past Medical History:  Diagnosis Date  . Arthritis   . Dementia (HCC)    early stages  . Diabetes mellitus   . Eosinophilic colitis   . GERD (gastroesophageal reflux disease)   . HOH (hard of hearing)   . Hypercholesterolemia   . Hypertension     Past Surgical History:  Procedure Laterality Date  . CATARACT EXTRACTION W/PHACO Left 03/07/2014   Procedure: CATARACT EXTRACTION PHACO AND INTRAOCULAR LENS PLACEMENT LEFT EYE CDE=7.11;  Surgeon: Loraine Leriche T. Nile Riggs, MD;  Location: [facility];  Service: Ophthalmology;  Laterality: Left;  . CATARACT EXTRACTION W/PHACO Right 03/14/2014   Procedure: CATARACT EXTRACTION PHACO AND INTRAOCULAR LENS PLACEMENT RIGHT EYE CDE=10.62;  Surgeon: Loraine Leriche T. Nile Riggs, MD;  Location: [facility];  Service: Ophthalmology;  Laterality: Right;  . COLONOSCOPY  2009   Dr. Darrick Penna: pancolonic diverticulosis, internal hemorrhoids  . COLONOSCOPY N/A 03/29/2015   RMR: inflammatory changes in the proximal rectum, colon and  terminal ileum suspicious for inflammatory bowel disease. ie. primarily Crohns colitis. Status post biopsy as described. concomitant NSAID exposure could excaerbate pre-existing inflammatory bowel disease but I do not think todays finding are primarly related to recent ibuprofen use.   Marland Kitchen KNEE ARTHROSCOPY Left   . RESECTION DISTAL CLAVICAL Left 10/15/2012   Procedure: RESECTION DISTAL CLAVICAL;  Surgeon: Vickki Hearing, MD;  Location: [facility];  Service: Orthopedics;  Laterality: Left;  . SHOULDER ACROMIOPLASTY Left 10/15/2012   Procedure: SHOULDER ACROMIOPLASTY;  Surgeon: Vickki Hearing,  MD;  Location: [facility];  Service: Orthopedics;  Laterality: Left;  . SHOULDER ARTHROSCOPY WITH OPEN ROTATOR CUFF REPAIR Left 10/15/2012   Procedure: SHOULDER ARTHROSCOPY WITH OPEN ROTATOR CUFF REPAIR;  Surgeon: Vickki Hearing, MD;  Location: [facility];  Service: Orthopedics;  Laterality: Left;  open at 0912; with bicep tendon debridement;   . SHOULDER OPEN ROTATOR CUFF REPAIR Right 08/20/2017   Procedure: ROTATOR CUFF REPAIR SHOULDER OPEN with removal of distal clavicle;  Surgeon: Vickki Hearing, MD;  Location: [facility];  Service: Orthopedics;  Laterality: Right;  . TONSILLECTOMY      No Known Allergies  Current Outpatient Medications  Medication Sig    . amLODipine (NORVASC) 2.5 MG tablet     . aspirin EC 81 MG tablet Take by mouth.    Marland Kitchen atorvastatin (LIPITOR) 40 MG tablet Take by mouth.    . cholecalciferol (VITAMIN D) 1000 units tablet Take 1,000 Units by mouth daily.    . cilostazol (PLETAL) 100 MG tablet Take 1 tablet (100 mg total) by mouth 2 (two) times daily before a meal.    . Cod Liver Oil 1000 MG CAPS Take 1 capsule by mouth daily.    . Cyanocobalamin (VITAMIN B-12) 1000 MCG/15ML LIQD Once a month    . Cyanocobalamin (VITAMIN B12 PO) Take 1 tablet by mouth daily.    Marland Kitchen dicyclomine (BENTYL) 10 MG capsule Take 1 capsule (10 mg total) by mouth 3 (three) times daily as needed for spasms (or diarrhea).    . DOCOSAHEXAENOIC ACID PO Take by mouth.    . donepezil (ARICEPT) 10 MG tablet Take 10 mg by mouth at  bedtime.     . empagliflozin (JARDIANCE) 10 MG TABS tablet Take 10 mg by mouth daily.    . fish oil-omega-3 fatty acids 1000 MG capsule Take 1 g by mouth 2 (two) times daily.     Marland Kitchen. gabapentin (NEURONTIN) 100 MG capsule Take 1 capsule (100 mg total) by mouth 3 (three) times daily.    . insulin aspart (NOVOLOG) 100 UNIT/ML injection Inject 20-36 Units into the skin See admin instructions. 20 units at lunch time, and 36 units at dinner    . insulin detemir (LEVEMIR) 100 UNIT/ML  injection Inject 24-55 Units into the skin 2 (two) times daily. 55 units each morning, and 24 units at bedtime    . metFORMIN (GLUCOPHAGE) 1000 MG tablet Take 1,000 mg by mouth 2 (two) times daily with a meal.      . Multiple Vitamin (MULTIVITAMIN WITH MINERALS) TABS tablet Take 1 tablet by mouth daily.    Marland Kitchen. omega-3 acid ethyl esters (LOVAZA) 1 g capsule Take by mouth.    . traMADol (ULTRAM) 50 MG tablet Take 1 tablet (50 mg total) by mouth every 6 (six) hours as needed.    . Vitamins/Minerals TABS Take by mouth.     Review of Systems PER HPI OTHERWISE ALL SYSTEMS ARE NEGATIVE.     Objective:   Physical Exam Vitals signs reviewed.  Constitutional:      General: He is not in acute distress.    Appearance: He is well-developed.  HENT:     Head: Normocephalic and atraumatic.     Mouth/Throat:     Pharynx: No oropharyngeal exudate.  Eyes:     General: No scleral icterus.    Pupils: Pupils are equal, round, and reactive to light.  Neck:     Musculoskeletal: Normal range of motion and neck supple.  Cardiovascular:     Rate and Rhythm: Normal rate and regular rhythm.     Heart sounds: Normal heart sounds.  Pulmonary:     Effort: Pulmonary effort is normal. No respiratory distress.     Breath sounds: Normal breath sounds.  Abdominal:     General: Bowel sounds are normal. There is no distension.     Palpations: Abdomen is soft.     Tenderness: There is no abdominal tenderness.  Lymphadenopathy:     Cervical: No cervical adenopathy.  Neurological:     Mental Status: He is alert and oriented to person, place, and time.           Assessment & Plan:

## 2018-09-02 NOTE — Progress Notes (Signed)
Referring Provider: Salley Scarlet, MD Primary Care Physician:  [name], FNP Primary GI:  Dr. Jena Gauss  Chief Complaint  Patient presents with  . Diarrhea    HPI:   [name] is a 85 y.o. male who presents for follow-up on diarrhea.  The patient was last seen in our office Levan 14 2019 for eosinophilic colitis, diarrhea.  History of eosinophilic colitis in 2016 treated with Entocort.  When he was initially seen recently he was having watery stools x3 a day, no recent sick contacts or hospitalization with intermittent abdominal pain sometimes improved with a bowel movement.  Stool studies were completed and normal.  He was started on Entocort and initially had a good response with this and Bentyl.  At his last visit he finished the Bentyl and was still on Entocort.  His wife stated a few days after starting Entocort he began having diarrhea again and currently having 2-4 loose stools a day.  When he ran out of Bentyl he did not notice an overt change in his symptoms.  No abdominal pain currently.  Some weight loss in the previous 2 months, objectively noted to be 8 pounds.  Aleve "every once in a while but not very often."  Previously tried Imodium without relief.  Today he states he's doing about the same. Still with diarrhea, had a few Entocort left and when he took this he had some improvement. Now that he's off of the Entocort, his stools are back to diarrhea. Probably with 2-3 stools a day. Has tried Imodium-D which has helped some.  Past Medical History:  Diagnosis Date  . Arthritis   . Dementia (HCC)    early stages  . Diabetes mellitus   . Eosinophilic colitis   . GERD (gastroesophageal reflux disease)   . HOH (hard of hearing)   . Hypercholesterolemia   . Hypertension     Past Surgical History:  Procedure Laterality Date  . CATARACT EXTRACTION W/PHACO Left 03/07/2014   Procedure: CATARACT EXTRACTION PHACO AND INTRAOCULAR LENS PLACEMENT LEFT EYE CDE=7.11;   Surgeon: Loraine Leriche T. Nile Riggs, MD;  Location: [facility];  Service: Ophthalmology;  Laterality: Left;  . CATARACT EXTRACTION W/PHACO Right 03/14/2014   Procedure: CATARACT EXTRACTION PHACO AND INTRAOCULAR LENS PLACEMENT RIGHT EYE CDE=10.62;  Surgeon: Loraine Leriche T. Nile Riggs, MD;  Location: [facility];  Service: Ophthalmology;  Laterality: Right;  . COLONOSCOPY  2009   Dr. Darrick Penna: pancolonic diverticulosis, internal hemorrhoids  . COLONOSCOPY N/A 03/29/2015   RMR: inflammatory changes in the proximal rectum, colon and  terminal ileum suspicious for inflammatory bowel disease. ie. primarily Crohns colitis. Status post biopsy as described. concomitant NSAID exposure could excaerbate pre-existing inflammatory bowel disease but I do not think todays finding are primarly related to recent ibuprofen use.   Marland Kitchen KNEE ARTHROSCOPY Left   . RESECTION DISTAL CLAVICAL Left 10/15/2012   Procedure: RESECTION DISTAL CLAVICAL;  Surgeon: Vickki Hearing, MD;  Location: [facility];  Service: Orthopedics;  Laterality: Left;  . SHOULDER ACROMIOPLASTY Left 10/15/2012   Procedure: SHOULDER ACROMIOPLASTY;  Surgeon: Vickki Hearing, MD;  Location: [facility];  Service: Orthopedics;  Laterality: Left;  . SHOULDER ARTHROSCOPY WITH OPEN ROTATOR CUFF REPAIR Left 10/15/2012   Procedure: SHOULDER ARTHROSCOPY WITH OPEN ROTATOR CUFF REPAIR;  Surgeon: Vickki Hearing, MD;  Location: [facility];  Service: Orthopedics;  Laterality: Left;  open at 0912; with bicep tendon debridement;   . SHOULDER OPEN ROTATOR CUFF REPAIR Right 08/20/2017   Procedure: ROTATOR CUFF REPAIR  SHOULDER OPEN with removal of distal clavicle;  Surgeon: Vickki HearingHarrison, Stanley E, MD;  Location: [facility];  Service: Orthopedics;  Laterality: Right;  . TONSILLECTOMY      Current Outpatient Medications  Medication Sig Dispense Refill  . amLODipine (NORVASC) 2.5 MG tablet     . aspirin EC 81 MG tablet Take by mouth.    Marland Kitchen. atorvastatin (LIPITOR) 40 MG tablet Take by mouth.    . cholecalciferol (VITAMIN  D) 1000 units tablet Take 1,000 Units by mouth daily.    . cilostazol (PLETAL) 100 MG tablet Take 1 tablet (100 mg total) by mouth 2 (two) times daily before a meal. 60 tablet 11  . Cod Liver Oil 1000 MG CAPS Take 1 capsule by mouth daily.    . Cyanocobalamin (VITAMIN B-12) 1000 MCG/15ML LIQD Once a month    . Cyanocobalamin (VITAMIN B12 PO) Take 1 tablet by mouth daily.    Marland Kitchen. dicyclomine (BENTYL) 10 MG capsule Take 1 capsule (10 mg total) by mouth 3 (three) times daily as needed for spasms (or diarrhea). 90 capsule 0  . DOCOSAHEXAENOIC ACID PO Take by mouth.    . donepezil (ARICEPT) 10 MG tablet Take 10 mg by mouth at bedtime.   1  . empagliflozin (JARDIANCE) 10 MG TABS tablet Take 10 mg by mouth daily.    . fish oil-omega-3 fatty acids 1000 MG capsule Take 1 g by mouth 2 (two) times daily.     Marland Kitchen. gabapentin (NEURONTIN) 100 MG capsule Take 1 capsule (100 mg total) by mouth 3 (three) times daily. 90 capsule 2  . insulin aspart (NOVOLOG) 100 UNIT/ML injection Inject 20-36 Units into the skin See admin instructions. 20 units at lunch time, and 36 units at dinner    . insulin detemir (LEVEMIR) 100 UNIT/ML injection Inject 24-55 Units into the skin 2 (two) times daily. 55 units each morning, and 24 units at bedtime    . metFORMIN (GLUCOPHAGE) 1000 MG tablet Take 1,000 mg by mouth 2 (two) times daily with a meal.      . Multiple Vitamin (MULTIVITAMIN WITH MINERALS) TABS tablet Take 1 tablet by mouth daily.    Marland Kitchen. omega-3 acid ethyl esters (LOVAZA) 1 g capsule Take by mouth.    . traMADol (ULTRAM) 50 MG tablet Take 1 tablet (50 mg total) by mouth every 6 (six) hours as needed. 20 tablet 0  . Vitamins/Minerals TABS Take by mouth.    . budesonide (ENTOCORT EC) 3 MG 24 hr capsule Take 2 capsules (6 mg total) by mouth daily for 30 days. 60 capsule 0   No current facility-administered medications for this visit.     Allergies as of 09/02/2018  . (No Known Allergies)    Family History  Problem Relation  Age of Onset  . Arthritis Other   . Cancer Other   . Diabetes Other   . Cancer Mother   . Colon cancer Neg Hx     Social History   Socioeconomic History  . Marital status: Married    Spouse name: Not on file  . Number of children: Not on file  . Years of education: Not on file  . Highest education level: Not on file  Occupational History  . Not on file  Social Needs  . Financial resource strain: Not on file  . Food insecurity:    Worry: Not on file    Inability: Not on file  . Transportation needs:    Medical: Not on file  Non-medical: Not on file  Tobacco Use  . Smoking status: Former Smoker    Packs/day: 3.00    Years: 30.00    Pack years: 90.00    Types: Cigarettes    Last attempt to quit: 10/11/1993    Years since quitting: 24.9  . Smokeless tobacco: Former Neurosurgeon  . Tobacco comment: Quit approx 20 years ago  Substance and Sexual Activity  . Alcohol use: No    Alcohol/week: 0.0 standard drinks  . Drug use: No  . Sexual activity: Yes    Birth control/protection: None  Lifestyle  . Physical activity:    Days per week: Not on file    Minutes per session: Not on file  . Stress: Not on file  Relationships  . Social connections:    Talks on phone: Not on file    Gets together: Not on file    Attends religious service: Not on file    Active member of club or organization: Not on file    Attends meetings of clubs or organizations: Not on file    Relationship status: Not on file  Other Topics Concern  . Not on file  Social History Narrative  . Not on file    Review of Systems: General: Negative for anorexia, weight loss, fever, chills, fatigue, weakness. ENT: Negative for hoarseness, difficulty swallowing. CV: Negative for chest pain, angina, palpitations, peripheral edema.  Respiratory: Negative for dyspnea at rest, cough, sputum, wheezing.  GI: See history of present illness. Psych: Patient and wife discuss worsening confusion ? Alzheimers; seeing  neurology.  Endo: Negative for unusual weight change.  Heme: Negative for bruising or bleeding. Allergy: Negative for rash or hives.   Physical Exam: BP 112/64   Pulse (!) 103   Temp (!) 97.2 F (36.2 C) (Oral)   Ht  (1.778 m)   Wt 185 lb 9.6 oz (84.2 kg)   BMI 26.63 kg/m  General:   Alert and oriented. Pleasant and cooperative. Well-nourished and well-developed.  Eyes:  Without icterus, sclera clear and conjunctiva pink.  Ears:  Normal auditory acuity. Cardiovascular:  S1, S2 present without murmurs appreciated. Extremities without clubbing or edema. Respiratory:  Clear to auscultation bilaterally. No wheezes, rales, or rhonchi. No distress.  Gastrointestinal:  +BS, soft, non-tender and non-distended. No HSM noted. No guarding or rebound. No masses appreciated.  Rectal:  Deferred  Musculoskalatal:  Symmetrical without gross deformities. Neurologic:  Alert and oriented x4;  grossly normal neurologically. Psych:  Alert and cooperative. Normal mood and affect. Heme/Lymph/Immune: No excessive bruising noted.    09/03/2018 2:13 PM   Disclaimer: This note was dictated with voice recognition software. Similar sounding words can inadvertently be transcribed and may not be corrected upon review.

## 2018-09-03 ENCOUNTER — Telehealth: Payer: Self-pay | Admitting: Nurse Practitioner

## 2018-09-03 ENCOUNTER — Encounter: Payer: Self-pay | Admitting: Nurse Practitioner

## 2018-09-03 MED ORDER — BUDESONIDE 3 MG PO CPEP: 6.0000 mg | ORAL_CAPSULE | Freq: Every day | ORAL | 0 refills | Status: DC

## 2018-09-03 NOTE — Assessment & Plan Note (Signed)
Chronic diarrhea with a history of eosinophilic colitis.  He is having persistent diarrhea, Imodium helps somewhat Bentyl ineffective.  Entocort did help.  Further treatment as per above.  Follow-up in 6 to 8 weeks with Dr. Jena Gauss.

## 2018-09-03 NOTE — Assessment & Plan Note (Signed)
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  He was treated with Entocort successfully.  3 years later is when he started having recurrent diarrhea.  He was treated with Entocort which helped, but diarrhea returned after completing this.  Attempted Bentyl which was ineffective.  Imodium helps somewhat.  Recommended probiotics and Metamucil fiber daily (stop if worsening diarrhea on this).    Further discussed further with Dr. Jena Gauss.  We will trial the patient on 6 mg of Entocort for 1 month, phone progress report at that time.  Can then decrease to 3 mg daily for approximately 3 months with outpatient follow-up at that time.  At that point we can consider switching to every other day.  He will likely need ongoing budesonide.

## 2018-09-03 NOTE — Telephone Encounter (Signed)
Please change the patient's follow-up appointment with RMR to 1 month (end of Feb) and notify the patient's wife of the new appointment date/time. Thanks!

## 2018-09-03 NOTE — Progress Notes (Signed)
PT WAS A NO SHOW 

## 2018-09-06 NOTE — Telephone Encounter (Signed)
Moved to earlier rmr appointment and called patient

## 2018-09-06 NOTE — Progress Notes (Signed)
CC'D TO PCP °

## 2018-09-15 ENCOUNTER — Ambulatory Visit: Payer: Medicare Other | Admitting: Orthopedic Surgery

## 2018-09-21 ENCOUNTER — Emergency Department (HOSPITAL_COMMUNITY)
Admission: EM | Admit: 2018-09-21 | Discharge: 2018-09-21 | Disposition: A | Discharge status: Home / Self Care | Payer: Medicare Other | Attending: Emergency Medicine | Admitting: Emergency Medicine

## 2018-09-21 ENCOUNTER — Encounter (HOSPITAL_COMMUNITY): Payer: Self-pay

## 2018-09-21 DIAGNOSIS — Z79899 Other long term (current) drug therapy: Secondary | ICD-10-CM | POA: Diagnosis not present

## 2018-09-21 DIAGNOSIS — R456 Violent behavior: Secondary | ICD-10-CM | POA: Diagnosis present

## 2018-09-21 DIAGNOSIS — I10 Essential (primary) hypertension: Secondary | ICD-10-CM | POA: Insufficient documentation

## 2018-09-21 DIAGNOSIS — F0391 Unspecified dementia with behavioral disturbance: Secondary | ICD-10-CM | POA: Diagnosis not present

## 2018-09-21 DIAGNOSIS — Z794 Long term (current) use of insulin: Secondary | ICD-10-CM | POA: Diagnosis not present

## 2018-09-21 DIAGNOSIS — Z046 Encounter for general psychiatric examination, requested by authority: Secondary | ICD-10-CM | POA: Diagnosis not present

## 2018-09-21 DIAGNOSIS — Z7982 Long term (current) use of aspirin: Secondary | ICD-10-CM | POA: Diagnosis not present

## 2018-09-21 DIAGNOSIS — Z96612 Presence of left artificial shoulder joint: Secondary | ICD-10-CM | POA: Diagnosis not present

## 2018-09-21 DIAGNOSIS — Z96652 Presence of left artificial knee joint: Secondary | ICD-10-CM | POA: Diagnosis not present

## 2018-09-21 DIAGNOSIS — Z87891 Personal history of nicotine dependence: Secondary | ICD-10-CM | POA: Insufficient documentation

## 2018-09-21 DIAGNOSIS — R339 Retention of urine, unspecified: Secondary | ICD-10-CM | POA: Diagnosis not present

## 2018-09-21 DIAGNOSIS — E119 Type 2 diabetes mellitus without complications: Secondary | ICD-10-CM | POA: Diagnosis not present

## 2018-09-21 DIAGNOSIS — R451 Restlessness and agitation: Secondary | ICD-10-CM | POA: Diagnosis not present

## 2018-09-21 DIAGNOSIS — Z781 Physical restraint status: Secondary | ICD-10-CM | POA: Insufficient documentation

## 2018-09-21 LAB — URINALYSIS, ROUTINE W REFLEX MICROSCOPIC
BILIRUBIN URINE: NEGATIVE
Bacteria, UA: NONE SEEN
Glucose, UA: >=500 mg/dL — AB
HGB URINE DIPSTICK: NEGATIVE
Ketones, ur: 5 mg/dL — AB
Leukocytes,Ua: NEGATIVE
NITRITE: NEGATIVE
Protein, ur: 100 mg/dL — AB
SPECIFIC GRAVITY, URINE: 1.02 (ref 1.005–1.030)
pH: 6 (ref 5.0–8.0)

## 2018-09-21 LAB — RAPID URINE DRUG SCREEN, HOSP PERFORMED
Amphetamines: NOT DETECTED
Barbiturates: NOT DETECTED
Benzodiazepines: NOT DETECTED
Cocaine: NOT DETECTED
Opiates: NOT DETECTED
TETRAHYDROCANNABINOL: NOT DETECTED

## 2018-09-21 LAB — BASIC METABOLIC PANEL
Anion gap: 14 (ref 5–15)
BUN: 23 mg/dL (ref 8–23)
CO2: 19 mmol/L — ABNORMAL LOW (ref 22–32)
Calcium: 9.3 mg/dL (ref 8.9–10.3)
Chloride: 107 mmol/L (ref 98–111)
Creatinine, Ser: 1.49 mg/dL — ABNORMAL HIGH (ref 0.61–1.24)
GFR calc Af Amer: 49 mL/min — ABNORMAL LOW (ref >60)
GFR calc non Af Amer: 42 mL/min — ABNORMAL LOW (ref >60)
GLUCOSE: 210 mg/dL — AB (ref 70–99)
Potassium: 4.6 mmol/L (ref 3.5–5.1)
Sodium: 140 mmol/L (ref 135–145)

## 2018-09-21 LAB — CBC WITH DIFFERENTIAL/PLATELET
Abs Immature Granulocytes: 0.06 10*3/uL (ref 0.00–0.07)
Basophils Absolute: 0 10*3/uL (ref 0.0–0.1)
Basophils Relative: 0 %
EOS ABS: 0 10*3/uL (ref 0.0–0.5)
Eosinophils Relative: 0 %
HCT: 40.3 % (ref 39.0–52.0)
Hemoglobin: 11.7 g/dL — ABNORMAL LOW (ref 13.0–17.0)
Immature Granulocytes: 1 %
Lymphocytes Relative: 30 %
Lymphs Abs: 2.2 10*3/uL (ref 0.7–4.0)
MCH: 23.6 pg — ABNORMAL LOW (ref 26.0–34.0)
MCHC: 29 g/dL — ABNORMAL LOW (ref 30.0–36.0)
MCV: 81.3 fL (ref 80.0–100.0)
Monocytes Absolute: 0.7 10*3/uL (ref 0.1–1.0)
Monocytes Relative: 9 %
Neutro Abs: 4.3 10*3/uL (ref 1.7–7.7)
Neutrophils Relative %: 60 %
Platelets: 124 10*3/uL — ABNORMAL LOW (ref 150–400)
RBC: 4.96 MIL/uL (ref 4.22–5.81)
RDW: 15.9 % — AB (ref 11.5–15.5)
WBC: 7.2 10*3/uL (ref 4.0–10.5)
nRBC: 0 % (ref 0.0–0.2)

## 2018-09-21 LAB — CBG MONITORING, ED: Glucose-Capillary: 191 mg/dL — ABNORMAL HIGH (ref 70–99)

## 2018-09-21 LAB — ETHANOL: Alcohol, Ethyl (B): <10 mg/dL (ref <10)
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
## 2018-09-21 NOTE — Discharge Instructions (Addendum)
Test showed no life-threatening condition.  Follow-up with your primary care doctor. °
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
## 2018-09-21 NOTE — ED Notes (Signed)
Pt's oxygen down to 86% when sleeping. Pt placed on 2L nasal cannula with assistance from NT. Pt became agitated and began yelling obscenities including "I will see you in hell you bitch." Sats increased to 95%.
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
## 2018-09-21 NOTE — ED Provider Notes (Signed)
Pt signed out by Dr. Adriana Simas pending TTS consult.  The pt was seen by the counselor, but there is no NP or Doc until after 7 pm.  The pt is getting agitated, and family wants to take him home.  I think that as long as family is ok taking him home, then that is the best place for him.  Pt is stable for d/c.   Jacalyn Lefevre, MD 09/21/18 1620
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
## 2018-09-21 NOTE — ED Notes (Signed)
Pt agitated.  According to [facility] at [facility], they do not have a provider that can recommend dispo.  Informed family.  They said pt is getting very agitated.  Notified Dr. Particia Nearing.  She spoke with family and agreed to d/c.
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
## 2018-09-21 NOTE — ED Notes (Signed)
Pt was c/o not being able to urinate.   Pt abd felt distended on palpation.   Pt bladder scanned. Showed to have +.   In and out cath .
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
## 2018-09-21 NOTE — ED Notes (Signed)
Hold Foley per EDP. Pt refusing to eat or drink at this time.

## 2018-09-28 ENCOUNTER — Ambulatory Visit: Payer: Medicare Other | Admitting: Internal Medicine

## 2018-09-30 ENCOUNTER — Ambulatory Visit: Payer: Medicare Other | Admitting: Nurse Practitioner

## 2018-10-01 ENCOUNTER — Ambulatory Visit: Payer: Medicare Other | Admitting: Nurse Practitioner

## 2018-10-01 ENCOUNTER — Encounter: Payer: Self-pay | Admitting: Nurse Practitioner

## 2018-10-01 VITALS — BP 128/81 | HR 62 | Temp 97.4°F | Ht 70.0 in | Wt 187.6 lb

## 2018-10-01 DIAGNOSIS — R197 Diarrhea, unspecified: Secondary | ICD-10-CM | POA: Diagnosis not present

## 2018-10-01 DIAGNOSIS — K639 Disease of intestine, unspecified: Secondary | ICD-10-CM | POA: Diagnosis not present

## 2018-10-01 MED ORDER — BUDESONIDE 3 MG PO CPEP: 3.0000 mg | ORAL_CAPSULE | Freq: Every day | ORAL | 2 refills | Status: DC

## 2018-10-01 NOTE — Assessment & Plan Note (Signed)
History of lymphocytic colitis initially did well with Entocort but kept having recurrent diarrhea when we would try to come off.  At this point he may need to be on Entocort indefinitely at a low dose.  He is completed 9 mg daily treatment and 1 month of 6 mg daily.  We will send in a prescription to have him take 3 mg a day for the next 3 months.  Office follow-up at that time to decide if 3 mg every other day is warranted based on his response.

## 2018-10-01 NOTE — Patient Instructions (Addendum)
Your health issues we discussed today were:   Diarrhea: 1. Finish your last day of Entocort 6 mg. 2. We will call the pharmacy and try to discern the pricing of 3 mg versus 6 mg versus 9 mg 3. When you finish your current medications, start Entocort 3 mg (1 pill) once a day.  You will take this for 3 months 4. We will send in a new prescription will we can get information back from the pharmacy related to the price.  Our goal is to get you the most "bang for your buck" to keep the medicine affordable.  Even if the pharmacy pill bottle technically says "take 9 mg a day" or "take 3 pills a day" we only want you to take 1 pill a day for the next 3 months 5. You can continue to use Imodium here and there, as needed 6. Call us if you have any worsening symptoms  Overall I recommend:  1. Follow-up in 3 months 2. Call us if you have any questions or concerns  At [facility] we value your feedback. You may receive a survey about your visit today. Please share your experience as we strive to create trusting relationships with our patients to provide genuine, compassionate, quality care.  We appreciate your understanding and patience as we review any laboratory studies, imaging, and other diagnostic tests that are ordered as we care for you. Our office policy is 5 business days for review of these results, and any emergent or urgent results are addressed in a timely manner for your best interest. If you do not hear from our office in 1 week, please contact us.   We also encourage the use of MyChart, which contains your medical information for your review as well. If you are not enrolled in this feature, an access code is on this after visit summary for your convenience. Thank you for allowing [place] to be involved in your care.  It was great to see you today!  I hope you have a great day!!

## 2018-10-01 NOTE — Progress Notes (Signed)
Referring Provider: [name], FNP Primary Care Physician:  [name], FNP Primary GI:  Dr. Jena Gauss  Chief Complaint  Patient presents with  . Diarrhea    doing some better    HPI:   [name] is a 85 y.o. 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  Noted history of progressive dementia.  Patient was last seen in our office 09/02/2018.  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  He returned about 3 years later having recurrent diarrhea for which he was started on Entocort and Bentyl which produced a good result initially.  However, several days after starting Entocort his diarrhea returned.  Bentyl did not help.  Imodium somewhat effective.  No abdominal pain.  Some weight loss noted (objectively 8 pounds).    At his last visit he was still having diarrhea and had a few Entocort left from his previous treatment course which she took and had some initial improvement.  However, when he ran out his diarrhea returned.  Recommended 6 mg of Entocort for 1 month with a progress report at that time.  Then decrease to 3 mg daily for 3 months and office follow-up.  At that point, consider to switching every other day.  Will likely need ongoing budesonide.  Today he is accompanied by his wife.  Today they state he's doing ok overall. He had some loose stools yesterday. His wife said he hasn't complained of loose stools otherwise. It appears he is at least improved. He is still on Entocort 6 mg daily. When he has a bad day takes Imodium which tends to help. Has had a lot of gas. Denies N/V, hematochezia, melena, fever, chills. Weight has been stable. Denies chest pain, dyspnea, dizziness, lightheadedness, syncope, near syncope. Denies any other upper or lower GI symptoms.  Past Medical History:  Diagnosis Date  . Arthritis   . Dementia (HCC)    early stages  . Diabetes mellitus   .  Eosinophilic colitis   . GERD (gastroesophageal reflux disease)   . HOH (hard of hearing)   . Hypercholesterolemia   . Hypertension     Past Surgical History:  Procedure Laterality Date  . CATARACT EXTRACTION W/PHACO Left 03/07/2014   Procedure: CATARACT EXTRACTION PHACO AND INTRAOCULAR LENS PLACEMENT LEFT EYE CDE=7.11;  Surgeon: Loraine Leriche T. Nile Riggs, MD;  Location: [facility];  Service: Ophthalmology;  Laterality: Left;  . CATARACT EXTRACTION W/PHACO Right 03/14/2014   Procedure: CATARACT EXTRACTION PHACO AND INTRAOCULAR LENS PLACEMENT RIGHT EYE CDE=10.62;  Surgeon: Loraine Leriche T. Nile Riggs, MD;  Location: [facility];  Service: Ophthalmology;  Laterality: Right;  . COLONOSCOPY  2009   Dr. Darrick Penna: pancolonic diverticulosis, internal hemorrhoids  . COLONOSCOPY N/A 03/29/2015   RMR: inflammatory changes in the proximal rectum, colon and  terminal ileum suspicious for inflammatory bowel disease. ie. primarily Crohns colitis. Status post biopsy as described. concomitant NSAID exposure could excaerbate pre-existing inflammatory bowel disease but I do not think todays finding are primarly related to recent ibuprofen use.   Marland Kitchen KNEE ARTHROSCOPY Left   . RESECTION DISTAL CLAVICAL Left 10/15/2012   Procedure: RESECTION DISTAL CLAVICAL;  Surgeon: Vickki Hearing, MD;  Location: [facility];  Service: Orthopedics;  Laterality: Left;  . SHOULDER ACROMIOPLASTY Left 10/15/2012   Procedure: SHOULDER ACROMIOPLASTY;  Surgeon: Vickki Hearing, MD;  Location: [facility];  Service: Orthopedics;  Laterality: Left;  . SHOULDER ARTHROSCOPY WITH OPEN ROTATOR CUFF REPAIR Left 10/15/2012  Procedure: SHOULDER ARTHROSCOPY WITH OPEN ROTATOR CUFF REPAIR;  Surgeon: Vickki Hearing, MD;  Location: [facility];  Service: Orthopedics;  Laterality: Left;  open at 0912; with bicep tendon debridement;   . SHOULDER OPEN ROTATOR CUFF REPAIR Right 08/20/2017   Procedure: ROTATOR CUFF REPAIR SHOULDER OPEN with removal of distal clavicle;  Surgeon: Vickki Hearing, MD;  Location: [facility];  Service: Orthopedics;  Laterality: Right;  . TONSILLECTOMY      Current Outpatient Medications  Medication Sig Dispense Refill  . amLODipine (NORVASC) 2.5 MG tablet Take 2.5 mg by mouth daily.     Marland Kitchen aspirin EC 81 MG tablet Take 81 mg by mouth daily.     . budesonide (ENTOCORT EC) 3 MG 24 hr capsule Take 2 capsules (6 mg total) by mouth daily for 30 days. 60 capsule 0  . cholecalciferol (VITAMIN D) 1000 units tablet Take 1,000 Units by mouth daily.    . cilostazol (PLETAL) 100 MG tablet Take 1 tablet (100 mg total) by mouth 2 (two) times daily before a meal. 60 tablet 11  . Cod Liver Oil 1000 MG CAPS Take 1 capsule by mouth daily.    . Cyanocobalamin (VITAMIN B-12) 1000 MCG/15ML LIQD Inject 1,000 mcg into the muscle every 30 (thirty) days. Once a month    . Cyanocobalamin (VITAMIN B12 PO) Take 1 tablet by mouth daily.    Marland Kitchen donepezil (ARICEPT) 10 MG tablet Take 10 mg by mouth at bedtime.   1  . empagliflozin (JARDIANCE) 25 MG TABS tablet Take 25 mg by mouth daily.    . fish oil-omega-3 fatty acids 1000 MG capsule Take 1 g by mouth 2 (two) times daily.     . fluticasone (FLONASE) 50 MCG/ACT nasal spray Place 2 sprays into both nostrils daily.    Marland Kitchen gabapentin (NEURONTIN) 100 MG capsule Take 100 mg by mouth 3 (three) times daily.    . insulin aspart (NOVOLOG) 100 UNIT/ML injection Inject 24-36 Units into the skin See admin instructions. 24 units at lunch time, and 36 units at dinner    . insulin detemir (LEVEMIR) 100 UNIT/ML injection Inject 36-55 Units into the skin 2 (two) times daily. 55 units each morning, and 36 units at bedtime    . lisinopril (PRINIVIL,ZESTRIL) 40 MG tablet Take 40 mg by mouth daily.    Marland Kitchen loratadine (CLARITIN) 10 MG tablet Take 10 mg by mouth daily.    . Multiple Vitamin (MULTIVITAMIN WITH MINERALS) TABS tablet Take 1 tablet by mouth daily.    . niacin (SLO-NIACIN) 500 MG tablet Take 500 mg by mouth at bedtime.    . Omega-3 Fatty Acids (FISH OIL)  1000 MG CAPS Take 1 capsule by mouth daily.    . pantoprazole (PROTONIX) 40 MG tablet Take 40 mg by mouth daily.    . Semaglutide (RYBELSUS) 3 MG TABS Take 1 tablet by mouth daily.    . traMADol (ULTRAM) 50 MG tablet Take 1 tablet (50 mg total) by mouth every 6 (six) hours as needed. 20 tablet 0  . levocetirizine (XYZAL) 5 MG tablet Take 5 mg by mouth every evening.     No current facility-administered medications for this visit.     Allergies as of 10/01/2018  . (No Known Allergies)    Family History  Problem Relation Age of Onset  . Arthritis Other   . Cancer Other   . Diabetes Other   . Cancer Mother   . Colon cancer Neg Hx  Social History   Socioeconomic History  . Marital status: Married    Spouse name: Not on file  . Number of children: Not on file  . Years of education: Not on file  . Highest education level: Not on file  Occupational History  . Not on file  Social Needs  . Financial resource strain: Not on file  . Food insecurity:    Worry: Not on file    Inability: Not on file  . Transportation needs:    Medical: Not on file    Non-medical: Not on file  Tobacco Use  . Smoking status: Former Smoker    Packs/day: 3.00    Years: 30.00    Pack years: 90.00    Types: Cigarettes    Last attempt to quit: 10/11/1993    Years since quitting: 24.9  . Smokeless tobacco: Former Neurosurgeon  . Tobacco comment: Quit approx 20 years ago  Substance and Sexual Activity  . Alcohol use: No    Alcohol/week: 0.0 standard drinks  . Drug use: No  . Sexual activity: Yes    Birth control/protection: None  Lifestyle  . Physical activity:    Days per week: Not on file    Minutes per session: Not on file  . Stress: Not on file  Relationships  . Social connections:    Talks on phone: Not on file    Gets together: Not on file    Attends religious service: Not on file    Active member of club or organization: Not on file    Attends meetings of clubs or organizations: Not on  file    Relationship status: Not on file  Other Topics Concern  . Not on file  Social History Narrative  . Not on file    Review of Systems: General: Negative for anorexia, weight loss, fever, chills, fatigue, weakness. Eyes: Negative for vision changes.  ENT: Negative for hoarseness, difficulty swallowing , nasal congestion. CV: Negative for chest pain, angina, palpitations, dyspnea on exertion, peripheral edema.  Respiratory: Negative for dyspnea at rest, dyspnea on exertion, cough, sputum, wheezing.  GI: See history of present illness. GU:  Negative for dysuria, hematuria, urinary incontinence, urinary frequency, nocturnal urination.  MS: Negative for joint pain, low back pain.  Derm: Negative for rash or itching.  Neuro: Negative for weakness, abnormal sensation, seizure, frequent headaches, memory loss, confusion.  Psych: Negative for anxiety, depression, suicidal ideation, hallucinations.  Endo: Negative for unusual weight change.  Heme: Negative for bruising or bleeding. Allergy: Negative for rash or hives.   Physical Exam: BP 128/81   Pulse 62   Temp (!) 97.4 F (36.3 C) (Oral)   Ht  (1.778 m)   Wt 187 lb 9.6 oz (85.1 kg)   BMI 26.92 kg/m  General:   Alert and oriented. Pleasant and cooperative. Well-nourished and well-developed.  Head:  Normocephalic and atraumatic. Eyes:  Without icterus, sclera clear and conjunctiva pink.  Ears:  Normal auditory acuity. Mouth:  No deformity or lesions, oral mucosa pink.  Throat/Neck:  Supple, without mass or thyromegaly. Cardiovascular:  S1, S2 present without murmurs appreciated. Normal pulses noted. Extremities without clubbing or edema. Respiratory:  Clear to auscultation bilaterally. No wheezes, rales, or rhonchi. No distress.  Gastrointestinal:  +BS, soft, non-tender and non-distended. No HSM noted. No guarding or rebound. No masses appreciated.  Rectal:  Deferred  Musculoskalatal:  Symmetrical without gross  deformities. Normal posture. Skin:  Intact without significant lesions or rashes. Neurologic:  Alert  and oriented x4;  grossly normal neurologically. Psych:  Alert and cooperative. Normal mood and affect. Heme/Lymph/Immune: No significant cervical adenopathy. No excessive bruising noted.    10/01/2018 11:13 AM   Disclaimer: This note was dictated with voice recognition software. Similar sounding words can inadvertently be transcribed and may not be corrected upon review.

## 2018-10-01 NOTE — Assessment & Plan Note (Signed)
Diarrhea likely due to lymphocytic colitis.  Further treatment regimen as per above as was previously discussed with Dr. Jena Gauss.  Can use Imodium intermittently for "bad days" as needed.  Follow-up in 3 months.

## 2018-10-04 NOTE — Progress Notes (Signed)
CC'D TO PCP °

## 2018-10-05 ENCOUNTER — Other Ambulatory Visit: Payer: Self-pay | Admitting: Nurse Practitioner

## 2018-10-05 DIAGNOSIS — K639 Disease of intestine, unspecified: Secondary | ICD-10-CM

## 2018-10-05 DIAGNOSIS — R197 Diarrhea, unspecified: Secondary | ICD-10-CM

## 2018-11-02 ENCOUNTER — Ambulatory Visit: Payer: Medicare Other | Admitting: Internal Medicine

## 2018-11-09 ENCOUNTER — Ambulatory Visit: Payer: Medicare Other | Admitting: Internal Medicine

## 2018-11-28 ENCOUNTER — Encounter: Payer: Self-pay | Admitting: Orthopedic Surgery

## 2018-12-14 ENCOUNTER — Other Ambulatory Visit: Payer: Self-pay

## 2018-12-14 ENCOUNTER — Encounter: Payer: Self-pay | Admitting: Internal Medicine

## 2018-12-14 ENCOUNTER — Ambulatory Visit (INDEPENDENT_AMBULATORY_CARE_PROVIDER_SITE_OTHER): Payer: Medicare Other | Admitting: Internal Medicine

## 2018-12-14 DIAGNOSIS — K52832 Lymphocytic colitis: Secondary | ICD-10-CM

## 2018-12-14 DIAGNOSIS — K219 Gastro-esophageal reflux disease without esophagitis: Secondary | ICD-10-CM | POA: Diagnosis not present

## 2018-12-14 NOTE — Progress Notes (Signed)
Referring Provider:  Primary Care Physician:  [name], FNP  Primary GI: dr. Jena Gauss  Virtual Visit via Telephone Note Due to COVID-19, visit is conducted virtually and was requested by patient.   I connected with [name] on 12/14/18 at 10:30 AM EDT by telephone and verified that I am speaking with the correct person using two identifiers.   I discussed the limitations, risks, security and privacy concerns of performing an evaluation and management service by telephone and the availability of in person appointments. I also discussed with the patient that there may be a patient responsible charge related to this service. The patient expressed understanding and agreed to proceed.  Chief Complaint  Patient presents with  . Diarrhea    f/u. off/on every once in a while. Does not feel it is daily     History of Present Illness: 85 year old gentleman with a history of chronic diarrhea documented lymphocytic colitis.  Has been on Entocort tapering dose more recently 3 mg daily took his last tablet about a week ago.  Family cannot afford.  Currently having about some diarrhea about 2-3 times weekly the rest of the time he does very well.  Takes occasional Pepto-Bismol.  GERD well-controlled on Protonix 40 mg daily.   Past Medical History:  Diagnosis Date  . Arthritis   . Dementia (HCC)    early stages  . Diabetes mellitus   . Eosinophilic colitis   . GERD (gastroesophageal reflux disease)   . HOH (hard of hearing)   . Hypercholesterolemia   . Hypertension      Past Surgical History:  Procedure Laterality Date  . CATARACT EXTRACTION W/PHACO Left 03/07/2014   Procedure: CATARACT EXTRACTION PHACO AND INTRAOCULAR LENS PLACEMENT LEFT EYE CDE=7.11;  Surgeon: Loraine Leriche T. Nile Riggs, MD;  Location: [facility];  Service: Ophthalmology;  Laterality: Left;  . CATARACT EXTRACTION W/PHACO Right 03/14/2014   Procedure: CATARACT EXTRACTION PHACO AND INTRAOCULAR LENS PLACEMENT RIGHT EYE CDE=10.62;   Surgeon: Loraine Leriche T. Nile Riggs, MD;  Location: [facility];  Service: Ophthalmology;  Laterality: Right;  . COLONOSCOPY  2009   Dr. Darrick Penna: pancolonic diverticulosis, internal hemorrhoids  . COLONOSCOPY N/A 03/29/2015   RMR: inflammatory changes in the proximal rectum, colon and  terminal ileum suspicious for inflammatory bowel disease. ie. primarily Crohns colitis. Status post biopsy as described. concomitant NSAID exposure could excaerbate pre-existing inflammatory bowel disease but I do not think todays finding are primarly related to recent ibuprofen use.   Marland Kitchen KNEE ARTHROSCOPY Left   . RESECTION DISTAL CLAVICAL Left 10/15/2012   Procedure: RESECTION DISTAL CLAVICAL;  Surgeon: Vickki Hearing, MD;  Location: [facility];  Service: Orthopedics;  Laterality: Left;  . SHOULDER ACROMIOPLASTY Left 10/15/2012   Procedure: SHOULDER ACROMIOPLASTY;  Surgeon: Vickki Hearing, MD;  Location: [facility];  Service: Orthopedics;  Laterality: Left;  . SHOULDER ARTHROSCOPY WITH OPEN ROTATOR CUFF REPAIR Left 10/15/2012   Procedure: SHOULDER ARTHROSCOPY WITH OPEN ROTATOR CUFF REPAIR;  Surgeon: Vickki Hearing, MD;  Location: [facility];  Service: Orthopedics;  Laterality: Left;  open at 0912; with bicep tendon debridement;   . SHOULDER OPEN ROTATOR CUFF REPAIR Right 08/20/2017   Procedure: ROTATOR CUFF REPAIR SHOULDER OPEN with removal of distal clavicle;  Surgeon: Vickki Hearing, MD;  Location: [facility];  Service: Orthopedics;  Laterality: Right;  . TONSILLECTOMY       Current Meds  Medication Sig  . amLODipine (NORVASC) 2.5 MG tablet Take 2.5 mg by mouth daily.   Marland Kitchen aspirin EC  81 MG tablet Take 81 mg by mouth daily.   . budesonide (ENTOCORT EC) 3 MG 24 hr capsule Take 1 capsule (3 mg total) by mouth daily.  . cholecalciferol (VITAMIN D) 1000 units tablet Take 1,000 Units by mouth daily.  . cilostazol (PLETAL) 100 MG tablet Take 1 tablet (100 mg total) by mouth 2 (two) times daily before a meal.  . Cod Liver Oil 1000 MG  CAPS Take 1 capsule by mouth daily.  . Cyanocobalamin (VITAMIN B-12) 1000 MCG/15ML LIQD Inject 1,000 mcg into the muscle every 30 (thirty) days. Once a month  . Cyanocobalamin (VITAMIN B12 PO) Take 1 tablet by mouth daily.  Marland Kitchen. donepezil (ARICEPT) 10 MG tablet Take 10 mg by mouth at bedtime.   . empagliflozin (JARDIANCE) 25 MG TABS tablet Take 25 mg by mouth daily.  . fish oil-omega-3 fatty acids 1000 MG capsule Take 1 g by mouth 2 (two) times daily.   . fluticasone (FLONASE) 50 MCG/ACT nasal spray Place 2 sprays into both nostrils daily.  Marland Kitchen. gabapentin (NEURONTIN) 100 MG capsule Take 100 mg by mouth 3 (three) times daily.  . insulin aspart (NOVOLOG) 100 UNIT/ML injection Inject 24-36 Units into the skin See admin instructions. 24 units at lunch time, and 36 units at dinner  . insulin detemir (LEVEMIR) 100 UNIT/ML injection Inject 36-55 Units into the skin 2 (two) times daily. 55 units each morning, and 36 units at bedtime  . lisinopril (PRINIVIL,ZESTRIL) 40 MG tablet Take 40 mg by mouth daily.  Marland Kitchen. loratadine (CLARITIN) 10 MG tablet Take 10 mg by mouth daily.  . Multiple Vitamin (MULTIVITAMIN WITH MINERALS) TABS tablet Take 1 tablet by mouth daily.  . niacin (SLO-NIACIN) 500 MG tablet Take 500 mg by mouth at bedtime.  . Omega-3 Fatty Acids (FISH OIL) 1000 MG CAPS Take 1 capsule by mouth daily.  . pantoprazole (PROTONIX) 40 MG tablet Take 40 mg by mouth daily.  . Semaglutide (RYBELSUS) 3 MG TABS Take 1 tablet by mouth daily.  . traMADol (ULTRAM) 50 MG tablet Take 1 tablet (50 mg total) by mouth every 6 (six) hours as needed.     Family History  Problem Relation Age of Onset  . Arthritis Other   . Cancer Other   . Diabetes Other   . Cancer Mother   . Colon cancer Neg Hx     Social History   Socioeconomic History  . Marital status: Married    Spouse name: Not on file  . Number of children: Not on file  . Years of education: Not on file  . Highest education level: Not on file   Occupational History  . Not on file  Social Needs  . Financial resource strain: Not on file  . Food insecurity:    Worry: Not on file    Inability: Not on file  . Transportation needs:    Medical: Not on file    Non-medical: Not on file  Tobacco Use  . Smoking status: Former Smoker    Packs/day: 3.00    Years: 30.00    Pack years: 90.00    Types: Cigarettes    Last attempt to quit: 10/11/1993    Years since quitting: 25.1  . Smokeless tobacco: Former NeurosurgeonUser  . Tobacco comment: Quit approx 20 years ago  Substance and Sexual Activity  . Alcohol use: No    Alcohol/week: 0.0 standard drinks  . Drug use: No  . Sexual activity: Yes    Birth control/protection: None  Lifestyle  .  Physical activity:    Days per week: Not on file    Minutes per session: Not on file  . Stress: Not on file  Relationships  . Social connections:    Talks on phone: Not on file    Gets together: Not on file    Attends religious service: Not on file    Active member of club or organization: Not on file    Attends meetings of clubs or organizations: Not on file    Relationship status: Not on file  Other Topics Concern  . Not on file  Social History Narrative  . Not on file       Review of Systems: As in HPI  Observations/Objective: No distress. Unable to perform physical exam due to telephone encounter. No video available.   Assessment and Plan: 85 year old gentleman history of lymphocytic colitis.  Diarrhea has improved significantly he does not have it every day which is reassuring.  Entocort too expensive.  GERD symptoms well controlled on Protonix 40 mg daily   Follow Up Instructions:  Continue Protonix 40 mg daily  May leave off Entocort or budesonide.  Take an over-the-counter dose of Pepto-Bismol twice daily-take every day(will dark in your stool)  Take a spare calendar and keep a stool diary.  Call me in 4 weeks and give me a progress report as to how bowels are functioning.   Office visit with [place] here in 3 months.     I discussed the assessment and treatment plan with the patient. The patient was provided an opportunity to ask questions and all were answered. The patient agreed with the plan and demonstrated an understanding of the instructions.   The patient was advised to call back or seek an in-person evaluation if the symptoms worsen or if the condition fails to improve as anticipated.  I provided 9 minutes of non-face-to-face time during this encounter.  Jonathon Bellows, MD [facility]

## 2018-12-14 NOTE — Patient Instructions (Signed)
   Continue Protonix 40 mg daily  May leave off Entocort or budesonide.  Take an over-the-counter dose of Pepto-Bismol twice daily-take every day(will dark in your stool)  Take a spare calendar and keep a stool diary.  Call me in 4 weeks and give me a progress report as to how bowels are functioning.  Office visit with [place] here in 3 months.

## 2018-12-21 ENCOUNTER — Telehealth: Payer: Self-pay | Admitting: Orthopedic Surgery

## 2018-12-21 NOTE — Telephone Encounter (Signed)
Set up virtual visit 

## 2018-12-21 NOTE — Telephone Encounter (Signed)
Patient's wife called saying that he is having trouble with his back and Left leg again. She is asking if he can be seen. I explained that with all the COVID-19 restrictions I would send you the message and see what you recommend, then give her a call back. Please advise

## 2018-12-22 ENCOUNTER — Other Ambulatory Visit: Payer: Self-pay

## 2018-12-22 ENCOUNTER — Encounter: Payer: Self-pay | Admitting: Orthopedic Surgery

## 2018-12-22 ENCOUNTER — Ambulatory Visit (INDEPENDENT_AMBULATORY_CARE_PROVIDER_SITE_OTHER): Payer: Medicare Other | Admitting: Orthopedic Surgery

## 2018-12-22 DIAGNOSIS — M48061 Spinal stenosis, lumbar region without neurogenic claudication: Secondary | ICD-10-CM | POA: Diagnosis not present

## 2018-12-22 MED ORDER — TRAMADOL HCL 50 MG PO TABS: 50.0000 mg | ORAL_TABLET | Freq: Four times a day (QID) | ORAL | 5 refills | Status: DC | PRN

## 2018-12-22 MED ORDER — PREDNISONE 10 MG (48) PO TBPK: ORAL_TABLET | Freq: Every day | ORAL | 0 refills | Status: DC
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
## 2018-12-28 ENCOUNTER — Other Ambulatory Visit: Payer: Self-pay | Admitting: Radiology

## 2018-12-28 DIAGNOSIS — M48061 Spinal stenosis, lumbar region without neurogenic claudication: Secondary | ICD-10-CM

## 2018-12-28 MED ORDER — TRAMADOL HCL 50 MG PO TABS: 50.0000 mg | ORAL_TABLET | Freq: Four times a day (QID) | ORAL | 5 refills | Status: DC | PRN

## 2018-12-28 NOTE — Telephone Encounter (Signed)
Faxed request for refill tramadol.    [facility]

## 2019-01-05 ENCOUNTER — Telehealth: Payer: Self-pay

## 2019-01-05 NOTE — Telephone Encounter (Signed)
Noted. Spouse is aware of RMR's recommendations.

## 2019-01-05 NOTE — Telephone Encounter (Signed)
Pt's spouse called to give an update of pts bowels s/p appointment 4 weeks ago. Pt has a hx of colitis. Pt's bowels are doing good this week spouse reports with no problems.

## 2019-01-05 NOTE — Telephone Encounter (Signed)
Good to hear a positive progress report.  I assume this is  related to Pepto-Bismol therapy.  I would continue Pepto-Bismol on a daily as needed basis.

## 2019-01-19 ENCOUNTER — Ambulatory Visit: Payer: Medicare Other | Admitting: Orthopedic Surgery

## 2019-01-21 ENCOUNTER — Ambulatory Visit (INDEPENDENT_AMBULATORY_CARE_PROVIDER_SITE_OTHER): Payer: Medicare Other | Admitting: Orthopedic Surgery

## 2019-01-21 ENCOUNTER — Other Ambulatory Visit: Payer: Self-pay

## 2019-01-21 DIAGNOSIS — M48062 Spinal stenosis, lumbar region with neurogenic claudication: Secondary | ICD-10-CM

## 2019-01-21 NOTE — Progress Notes (Signed)
Virtual Visit via Telephone Note  I connected with [name] on 01/21/19 at 11:50 AM EDT by telephone and verified that I am speaking with the correct person using two identifiers.  Location: Patient: HOME  Provider: OFFICE   I discussed the limitations, risks, security and privacy concerns of performing an evaluation and management service by telephone and the availability of in person appointments. I also discussed with the patient that there may be a patient responsible charge related to this service. The patient expressed understanding and agreed to proceed.   History of Present Illness: 85 year old male with spinal stenosis currently being followed at home due to COVID-19 complains of back pain and burning bilateral leg pain difficulty walking    Observations/Objective: Wife indicates he is having trouble walking and the burning pain runs down the inside of both legs  Assessment and Plan: Spinal stenosis  Follow Up Instructions:   Patient needs office visit  We did try some medications for him but they were unsuccessful in relieving his symptoms I discussed the assessment and treatment plan with the patient. The patient was provided an opportunity to ask questions and all were answered. The patient agreed with the plan and demonstrated an understanding of the instructions.   The patient was advised to call back or seek an in-person evaluation if the symptoms worsen or if the condition fails to improve as anticipated.  I provided 4 minutes of non-face-to-face time during this encounter.   Arther Abbott, MD

## 2019-01-26 ENCOUNTER — Other Ambulatory Visit: Payer: Self-pay

## 2019-01-26 ENCOUNTER — Ambulatory Visit (INDEPENDENT_AMBULATORY_CARE_PROVIDER_SITE_OTHER): Payer: Medicare Other | Admitting: Orthopedic Surgery

## 2019-01-26 ENCOUNTER — Telehealth: Payer: Self-pay | Admitting: Radiology

## 2019-01-26 ENCOUNTER — Encounter: Payer: Self-pay | Admitting: Orthopedic Surgery

## 2019-01-26 ENCOUNTER — Ambulatory Visit (INDEPENDENT_AMBULATORY_CARE_PROVIDER_SITE_OTHER): Payer: Medicare Other

## 2019-01-26 VITALS — BP 147/71 | HR 107 | Temp 98.1°F | Ht 70.0 in | Wt 187.0 lb

## 2019-01-26 DIAGNOSIS — M48062 Spinal stenosis, lumbar region with neurogenic claudication: Secondary | ICD-10-CM | POA: Diagnosis not present

## 2019-01-26 DIAGNOSIS — M5440 Lumbago with sciatica, unspecified side: Secondary | ICD-10-CM

## 2019-01-26 DIAGNOSIS — G8929 Other chronic pain: Secondary | ICD-10-CM | POA: Diagnosis not present

## 2019-01-26 DIAGNOSIS — M5136 Other intervertebral disc degeneration, lumbar region: Secondary | ICD-10-CM | POA: Diagnosis not present

## 2019-01-26 NOTE — Progress Notes (Signed)
Chief Complaint  Patient presents with  . Back Pain    inside of left thigh is sore all the time     85 year old male history of lumbar disc disease has progressive weakness progressive loss of ability to walk without assistance lower back pain with predominant left leg pain radiating to his left foot he was treated during the COVID-19 restriction time with gabapentin with no relief he is now using a cane his pain is increasing.  He describes as burning down the inner thigh down to the inner portion of the foot   Past Medical History:  Diagnosis Date  . Arthritis   . Dementia (HCC)    early stages  . Diabetes mellitus   . Eosinophilic colitis   . GERD (gastroesophageal reflux disease)   . HOH (hard of hearing)   . Hypercholesterolemia   . Hypertension     Review of Systems  Musculoskeletal: Positive for myalgias.       Gait disturbance  Neurological: Positive for weakness.   Denies red flags of fever chills nausea vomiting weight loss   Current Outpatient Medications:  .  amLODipine (NORVASC) 2.5 MG tablet, Take 2.5 mg by mouth daily. , Disp: , Rfl:  .  aspirin EC 81 MG tablet, Take 81 mg by mouth daily. , Disp: , Rfl:  .  cholecalciferol (VITAMIN D) 1000 units tablet, Take 1,000 Units by mouth daily., Disp: , Rfl:  .  cilostazol (PLETAL) 100 MG tablet, Take 1 tablet (100 mg total) by mouth 2 (two) times daily before a meal., Disp: 60 tablet, Rfl: 11 .  Cod Liver Oil 1000 MG CAPS, Take 1 capsule by mouth daily., Disp: , Rfl:  .  Cyanocobalamin (VITAMIN B-12) 1000 MCG/15ML LIQD, Inject 1,000 mcg into the muscle every 30 (thirty) days. Once a month, Disp: , Rfl:  .  Cyanocobalamin (VITAMIN B12 PO), Take 1 tablet by mouth daily., Disp: , Rfl:  .  donepezil (ARICEPT) 10 MG tablet, Take 10 mg by mouth at bedtime. , Disp: , Rfl: 1 .  fish oil-omega-3 fatty acids 1000 MG capsule, Take 1 g by mouth 2 (two) times daily. , Disp: , Rfl:  .  fluticasone (FLONASE) 50 MCG/ACT nasal spray,  Place 2 sprays into both nostrils daily., Disp: , Rfl:  .  gabapentin (NEURONTIN) 100 MG capsule, Take 100 mg by mouth 3 (three) times daily., Disp: , Rfl:  .  insulin aspart (NOVOLOG) 100 UNIT/ML injection, Inject 24-36 Units into the skin See admin instructions. 24 units at lunch time, and 36 units at dinner, Disp: , Rfl:  .  insulin detemir (LEVEMIR) 100 UNIT/ML injection, Inject 36-55 Units into the skin 2 (two) times daily. 55 units each morning, and 36 units at bedtime, Disp: , Rfl:  .  lisinopril (PRINIVIL,ZESTRIL) 40 MG tablet, Take 40 mg by mouth daily., Disp: , Rfl:  .  memantine (NAMENDA) 5 MG tablet, Take 5 mg by mouth 2 (two) times daily., Disp: , Rfl:  .  Multiple Vitamin (MULTIVITAMIN WITH MINERALS) TABS tablet, Take 1 tablet by mouth daily., Disp: , Rfl:  .  niacin (SLO-NIACIN) 500 MG tablet, Take 500 mg by mouth at bedtime., Disp: , Rfl:  .  Omega-3 Fatty Acids (FISH OIL) 1000 MG CAPS, Take 1 capsule by mouth daily., Disp: , Rfl:  .  pantoprazole (PROTONIX) 40 MG tablet, Take 40 mg by mouth daily., Disp: , Rfl:  .  QUEtiapine (SEROQUEL) 50 MG tablet, Take 50 mg by mouth at  bedtime., Disp: , Rfl:  .  Semaglutide (RYBELSUS) 3 MG TABS, Take 1 tablet by mouth daily., Disp: , Rfl:  .  traMADol (ULTRAM) 50 MG tablet, Take 1 tablet (50 mg total) by mouth every 6 (six) hours as needed., Disp: 60 tablet, Rfl: 5 .  budesonide (ENTOCORT EC) 3 MG 24 hr capsule, Take 1 capsule (3 mg total) by mouth daily. (Patient not taking: Reported on 12/22/2018), Disp: 30 capsule, Rfl: 2 .  empagliflozin (JARDIANCE) 25 MG TABS tablet, Take 25 mg by mouth daily., Disp: , Rfl:  .  loratadine (CLARITIN) 10 MG tablet, Take 10 mg by mouth daily., Disp: , Rfl:  .  predniSONE (STERAPRED UNI-PAK 48 TAB) 10 MG (48) TBPK tablet, Take by mouth daily. (Patient not taking: Reported on 01/26/2019), Disp: 48 tablet, Rfl: 0  BP (!) 147/71   Pulse (!) 107   Temp 98.1 F (36.7 C)   Ht 5\' 10"  (1.778 m)   Wt 187 lb (84.8  kg)   BMI 26.83 kg/m   Physical Exam  TENDERNESS lumbar spine medial thigh  ROM normal in both hips decreased flexion extension lumbar spine INSTABILITY left hip stable MOTOR no distractible motor weakness SKIN lumbar skin lower extremity skin normal   CDV no peripheral edema SENSATION no sensory changes normal reflexes LYMPH right and left groin normal  MEDICAL DECISIONS  Data:   [facility] Paganini spine films show progressive spondylolisthesis and spondylosis L4-S1 Summation of records-   Encounter Diagnoses  Name Primary?  . Chronic midline low back pain with sciatica, sciatica laterality unspecified Yes  . Other intervertebral disc degeneration, lumbar region      Diagnostics   Plan: MRI lumbar spine

## 2019-01-26 NOTE — Telephone Encounter (Signed)
Called to schedule July 2nd 12 30 arrive for 1pm scan.

## 2019-02-03 ENCOUNTER — Ambulatory Visit (HOSPITAL_COMMUNITY)
Admission: RE | Admit: 2019-02-03 | Discharge: 2019-02-03 | Disposition: A | Discharge status: Home / Self Care | Payer: Medicare Other | Source: Ambulatory Visit | Attending: Orthopedic Surgery | Admitting: Orthopedic Surgery

## 2019-02-03 ENCOUNTER — Other Ambulatory Visit: Payer: Self-pay

## 2019-02-03 DIAGNOSIS — M5136 Other intervertebral disc degeneration, lumbar region: Secondary | ICD-10-CM

## 2019-02-08 ENCOUNTER — Telehealth: Payer: Self-pay | Admitting: Orthopedic Surgery

## 2019-02-08 NOTE — Telephone Encounter (Signed)
[name] called for her husband this morning.  They would like to get the results of the MRI

## 2019-02-08 NOTE — Telephone Encounter (Signed)
IMPRESSION: 1. Stable degenerative lumbar spondylosis with mild disc disease and facet disease for age. 2. Transitional lumbar anatomy as detailed above. Full disc space at S1-2. 3. Bilateral pars defects at S1 with a grade 1 spondylolisthesis, stable. 4. Mild multilevel bulging discs with mild lateral recess encroachment but no significant spinal or foraminal stenosis in the lumbar spine.   Will you call him, please ?

## 2019-02-10 ENCOUNTER — Telehealth: Payer: Self-pay | Admitting: Orthopedic Surgery

## 2019-02-10 DIAGNOSIS — M48061 Spinal stenosis, lumbar region without neurogenic claudication: Secondary | ICD-10-CM

## 2019-02-10 MED ORDER — TRAMADOL HCL 50 MG PO TABS: 50.0000 mg | ORAL_TABLET | Freq: Four times a day (QID) | ORAL | 5 refills | Status: DC | PRN

## 2019-02-10 NOTE — Telephone Encounter (Signed)
FYI  [name] wife called this afternoon stating that you sent in Tramadol for this patient.  She said  [name] has already taken that and it doesn't help.  She said she did not go pick this up.

## 2019-02-10 NOTE — Telephone Encounter (Signed)
Mri 02/03/19  IMPRESSION: 1. Transitional lumbosacral anatomy. As correlated with prior radiographs, there is a transitional nearly fully lumbarized S1 segment. 2. Suspected bilateral S1 pars defects with grade 1 anterolisthesis and disc bulging at S1-2. No foraminal compromise or exiting nerve root encroachment. 3. Shallow right paracentral disc protrusion at L5-S1 with possible right S1 nerve root encroachment in the subarticular zone. 4. No other significant spinal stenosis or nerve root encroachment.     Electronically Signed   By: Camie Patience M.D.   On: 08/03/2014 14:59  Discussed with wife because his hearing aid malfunctioning   Dont see surgery as an option; Physical therapy and pain medication is only option. Wife agrees, sees  neurology soon. Wants to try something for pain   Gabapentin  No Known Allergies ultracet for pain   They dont want to try therapy

## 2019-02-10 NOTE — Telephone Encounter (Signed)
-----   Message from Carole Civil, MD sent at 02/08/2019  9:00 AM EDT ----- MRI shows multilevel disc disease nothing severe in terms of spinal stenosis I do not think this explains the patient's deterioration and will recommend a medical work-up with primary care doctor to look for other sources such as failure to thrive advancing dementia malnutrition

## 2019-02-11 ENCOUNTER — Other Ambulatory Visit: Payer: Self-pay | Admitting: Orthopedic Surgery

## 2019-02-11 DIAGNOSIS — G8929 Other chronic pain: Secondary | ICD-10-CM

## 2019-02-11 DIAGNOSIS — M5440 Lumbago with sciatica, unspecified side: Secondary | ICD-10-CM

## 2019-02-11 MED ORDER — MELOXICAM 7.5 MG PO TABS: 7.5000 mg | ORAL_TABLET | Freq: Every day | ORAL | 5 refills | Status: DC

## 2019-04-15 ENCOUNTER — Ambulatory Visit: Payer: Medicare Other | Admitting: Internal Medicine

## 2019-04-15 ENCOUNTER — Other Ambulatory Visit: Payer: Self-pay

## 2019-04-15 ENCOUNTER — Encounter: Payer: Self-pay | Admitting: Internal Medicine

## 2019-04-15 VITALS — BP 111/68 | HR 101 | Temp 96.9°F

## 2019-04-15 DIAGNOSIS — K5282 Eosinophilic colitis: Secondary | ICD-10-CM

## 2019-04-15 DIAGNOSIS — K219 Gastro-esophageal reflux disease without esophagitis: Secondary | ICD-10-CM

## 2019-04-15 NOTE — Progress Notes (Signed)
Primary Care Physician:  Marylynn PearsonMcCorkle, Tenika, FNP Primary Gastroenterologist:  Dr. Jena Gaussourk  Pre-Procedure History & Physical: HPI:  [name] is a 85 y.o. 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  No longer taking nonsteroidal agents.  Previously on Entocort but cannot afford.  Currently taking Pepto-Bismol tablets daily and as needed Imodium.  Patient and spouse state the vast majority of the time he has normal bowel function 1 formed bowel movement each morning occasionally has 1 loose bowel movement.  Most of the time he has normal bowel function.  He has no abdominal pain reflux well controlled on Protonix.  No rectal bleeding.  He is happy.  Oral intake is maintained weight appears stable. He does have significant dementia and is hard of hearing.  Past Medical History:  Diagnosis Date  . Arthritis   . Dementia (HCC)    early stages  . Diabetes mellitus   . Eosinophilic colitis   . GERD (gastroesophageal reflux disease)   . HOH (hard of hearing)   . Hypercholesterolemia   . Hypertension     Past Surgical History:  Procedure Laterality Date  . CATARACT EXTRACTION W/PHACO Left 03/07/2014   Procedure: CATARACT EXTRACTION PHACO AND INTRAOCULAR LENS PLACEMENT LEFT EYE CDE=7.11;  Surgeon: [name], MD;  Location: [facility];  Service: Ophthalmology;  Laterality: Left;  . CATARACT EXTRACTION W/PHACO Right 03/14/2014   Procedure: CATARACT EXTRACTION PHACO AND INTRAOCULAR LENS PLACEMENT RIGHT EYE CDE=10.62;  Surgeon: [name], MD;  Location: [facility];  Service: Ophthalmology;  Laterality: Right;  . COLONOSCOPY  2009   Dr. Darrick PennaFields: pancolonic diverticulosis, internal hemorrhoids  . COLONOSCOPY N/A 03/29/2015   RMR: inflammatory changes in the proximal rectum, colon and  terminal ileum suspicious for inflammatory bowel disease. ie. primarily Crohns colitis. Status post biopsy as described. concomitant NSAID exposure could excaerbate pre-existing inflammatory bowel disease but I do not  think todays finding are primarly related to recent ibuprofen use.   Marland Kitchen. KNEE ARTHROSCOPY Left   . RESECTION DISTAL CLAVICAL Left 10/15/2012   Procedure: RESECTION DISTAL CLAVICAL;  Surgeon: [name], MD;  Location: [facility];  Service: Orthopedics;  Laterality: Left;  . SHOULDER ACROMIOPLASTY Left 10/15/2012   Procedure: SHOULDER ACROMIOPLASTY;  Surgeon: [name], MD;  Location: [facility];  Service: Orthopedics;  Laterality: Left;  . SHOULDER ARTHROSCOPY WITH OPEN ROTATOR CUFF REPAIR Left 10/15/2012   Procedure: SHOULDER ARTHROSCOPY WITH OPEN ROTATOR CUFF REPAIR;  Surgeon: [name], MD;  Location: [facility];  Service: Orthopedics;  Laterality: Left;  open at 0912; with bicep tendon debridement;   . SHOULDER OPEN ROTATOR CUFF REPAIR Right 08/20/2017   Procedure: ROTATOR CUFF REPAIR SHOULDER OPEN with removal of distal clavicle;  Surgeon: Vickki HearingHarrison, Stanley E, MD;  Location: [facility];  Service: Orthopedics;  Laterality: Right;  . TONSILLECTOMY      Prior to Admission medications   Medication Sig Start Date End Date Taking? Authorizing Provider  amLODipine (NORVASC) 2.5 MG tablet Take 2.5 mg by mouth daily.  12/11/17  Yes [provider]  aspirin EC 81 MG tablet Take 81 mg by mouth daily.    Yes [provider]  bismuth subsalicylate (PEPTO BISMOL) 262 MG chewable tablet Chew 262 mg by mouth as needed.   Yes [provider]  cholecalciferol (VITAMIN D) 1000 units tablet Take 1,000 Units by mouth daily.   Yes [provider]  cilostazol (PLETAL) 100 MG tablet Take 1 tablet (100 mg total) by mouth 2 (two) times  daily before a meal. 08/31/18  Yes Marty Heck, MD  [employer] Liver Oil 1000 MG CAPS Take 1 capsule by mouth daily.   Yes [provider]  Cyanocobalamin (VITAMIN B-12) 1000 MCG/15ML LIQD Inject 1,000 mcg into the muscle every 30 (thirty) days. Once a month   Yes [provider]  Cyanocobalamin (VITAMIN B12 PO) Take 1 tablet  by mouth daily.   Yes [provider]  donepezil (ARICEPT) 10 MG tablet Take 10 mg by mouth at bedtime.  07/16/17  Yes [provider]  Fiber POWD Take by mouth. 1 tsp daily   Yes [provider]  fish oil-omega-3 fatty acids 1000 MG capsule Take 1 g by mouth 2 (two) times daily.    Yes [provider]  fluticasone (FLONASE) 50 MCG/ACT nasal spray Place 2 sprays into both nostrils daily.   Yes [provider]  gabapentin (NEURONTIN) 100 MG capsule Take 100 mg by mouth 3 (three) times daily.   Yes [provider]  insulin aspart (NOVOLOG) 100 UNIT/ML injection Inject 24-36 Units into the skin See admin instructions. 24 units at lunch time, and 36 units at dinner   Yes [provider]  insulin detemir (LEVEMIR) 100 UNIT/ML injection Inject 36-55 Units into the skin 2 (two) times daily. 55 units each morning, and 36 units at bedtime   Yes [provider]  lisinopril (PRINIVIL,ZESTRIL) 40 MG tablet Take 40 mg by mouth daily.   Yes [provider]  loperamide (IMODIUM A-D) 2 MG tablet Take 2 mg by mouth as needed for diarrhea or loose stools.   Yes [provider]  loratadine (CLARITIN) 10 MG tablet Take 10 mg by mouth daily.   Yes [provider]  meloxicam (MOBIC) 7.5 MG tablet Take 1 tablet (7.5 mg total) by mouth daily. 02/11/19  Yes Carole Civil, MD  memantine (NAMENDA) 5 MG tablet Take 5 mg by mouth 2 (two) times daily.   Yes [provider]  Multiple Vitamin (MULTIVITAMIN WITH MINERALS) TABS tablet Take 1 tablet by mouth daily.   Yes [provider]  niacin (SLO-NIACIN) 500 MG tablet Take 500 mg by mouth at bedtime.   Yes [provider]  Omega-3 Fatty Acids (FISH OIL) 1000 MG CAPS Take 1 capsule by mouth daily.   Yes [provider]  pantoprazole (PROTONIX) 40 MG tablet Take 40 mg by mouth daily.   Yes [provider]  QUEtiapine (SEROQUEL) 50 MG  tablet Take 50 mg by mouth at bedtime.   Yes [provider]  Semaglutide (RYBELSUS) 3 MG TABS Take 1 tablet by mouth daily.   Yes [provider]  traMADol (ULTRAM) 50 MG tablet Take 1 tablet (50 mg total) by mouth every 6 (six) hours as needed. 02/10/19  Yes Carole Civil, MD    Allergies as of 04/15/2019  . (No Known Allergies)    Family History  Problem Relation Age of Onset  . Arthritis Other   . Cancer Other   . Diabetes Other   . Cancer Mother   . Colon cancer Neg Hx     Social History   Socioeconomic History  . Marital status: Married    Spouse name: Not on file  . Number of children: Not on file  . Years of education: Not on file  . Highest education level: Not on file  Occupational History  . Not on file  Social Needs  . Financial resource strain: Not on file  .  Food insecurity    Worry: Not on file    Inability: Not on file  . Transportation needs    Medical: Not on file    Non-medical: Not on file  Tobacco Use  . Smoking status: Former Smoker    Packs/day: 3.00    Years: 30.00    Pack years: 90.00    Types: Cigarettes    Quit date: 10/11/1993    Years since quitting: 25.5  . Smokeless tobacco: Former Neurosurgeon  . Tobacco comment: Quit approx 20 years ago  Substance and Sexual Activity  . Alcohol use: No    Alcohol/week: 0.0 standard drinks  . Drug use: No  . Sexual activity: Yes    Birth control/protection: None  Lifestyle  . Physical activity    Days per week: Not on file    Minutes per session: Not on file  . Stress: Not on file  Relationships  . Social Musician on phone: Not on file    Gets together: Not on file    Attends religious service: Not on file    Active member of club or organization: Not on file    Attends meetings of clubs or organizations: Not on file    Relationship status: Not on file  . Intimate partner violence    Fear of current or ex partner: Not on file    Emotionally abused: Not on file     Physically abused: Not on file    Forced sexual activity: Not on file  Other Topics Concern  . Not on file  Social History Narrative  . Not on file    Review of Systems: See HPI, otherwise negative ROS  Physical Exam: BP 111/68   Pulse (!) 101   Temp (!) 96.9 F (36.1 C) (Temporal)  General:    pleasant and cooperative in NAD Neck:  Supple; no masses or thyromegaly. No significant cervical adenopathy. Lungs:  Clear throughout to auscultation.   No wheezes, crackles, or rhonchi. No acute distress. Heart:  Regular rate and rhythm; no murmurs, clicks, rubs,  or gallops. Abdomen: Non-distended, normal bowel sounds.  Soft and nontender without appreciable mass or hepatosplenomegaly.  Pulses:  Normal pulses noted. Extremities:  Without clubbing or edema.  Impression/Plan: Pleasant 86 year old gentleman with a history of eosinophilic ileocolitis.  Appears to be pretty much in remission clinically at this time.  He is no longer using nonsteroidal agents.  He has occasional diarrhea is controlled with Pepto-Bismol or Imodium.  GERD well-controlled on Protonix 40 mg daily.  Recommendations:  Continue Protonix 40 mg daily  May continue pepto-bismol  and imodium as needed  Call if frequent diarrhea recurs  OV in 6 months      Notice: This dictation was prepared with Dragon dictation along with smaller phrase technology. Any transcriptional errors that result from this process are unintentional and may not be corrected upon review.

## 2019-04-15 NOTE — Patient Instructions (Signed)
Continue Protonix 40 mg daily  May continue pepto-bismol  and imodium as needed  Call if frequent diarrhea recurs  OV in 6 months

## 2019-07-27 ENCOUNTER — Other Ambulatory Visit: Payer: Self-pay | Admitting: Orthopedic Surgery

## 2019-07-27 DIAGNOSIS — M5440 Lumbago with sciatica, unspecified side: Secondary | ICD-10-CM

## 2019-07-27 DIAGNOSIS — G8929 Other chronic pain: Secondary | ICD-10-CM

## 2019-09-19 ENCOUNTER — Encounter: Payer: Self-pay | Admitting: Internal Medicine

## 2019-09-19 ENCOUNTER — Telehealth (HOSPITAL_COMMUNITY): Payer: Self-pay

## 2019-09-19 NOTE — Telephone Encounter (Signed)

## 2019-09-20 ENCOUNTER — Other Ambulatory Visit: Payer: Self-pay

## 2019-09-20 ENCOUNTER — Ambulatory Visit (HOSPITAL_COMMUNITY)
Admission: RE | Admit: 2019-09-20 | Discharge: 2019-09-20 | Disposition: A | Discharge status: Home / Self Care | Payer: Medicare Other | Source: Ambulatory Visit | Attending: Surgery | Admitting: Surgery

## 2019-09-20 ENCOUNTER — Ambulatory Visit (INDEPENDENT_AMBULATORY_CARE_PROVIDER_SITE_OTHER): Payer: Medicare Other | Admitting: Vascular Surgery

## 2019-09-20 ENCOUNTER — Encounter: Payer: Self-pay | Admitting: Vascular Surgery

## 2019-09-20 VITALS — BP 136/74 | HR 105 | Temp 98.1°F | Resp 18 | Ht 70.0 in | Wt 180.0 lb

## 2019-09-20 DIAGNOSIS — I70219 Atherosclerosis of native arteries of extremities with intermittent claudication, unspecified extremity: Secondary | ICD-10-CM | POA: Diagnosis not present

## 2019-09-20 NOTE — Progress Notes (Signed)
Patient name: [name] MRN: [id number] DOB: 05/08/1930 Sex: male  REASON FOR VISIT: 1 year follow-up for claudication  HPI: [name] is a 86 y.o. male with Alzheimer's dementia, hypertension, hyperlipidemia, peripheral vascular disease who presents for one year follow-up for claudication.  Patient was previously followed by Dr. Imogene Burn.  He has been on Pletal which he previously stated helped with significant improvement.  His wife is with him again today due to ongoing dementia.  Appears he has had no significant complaints of leg pain at home.  No wounds or other ulcers that are nonhealing.  She feels he is doing okay from a leg standpoint.  Still taking the Pletal.  Past Medical History:  Diagnosis Date  . Arthritis   . Dementia (HCC)    early stages  . Diabetes mellitus   . Eosinophilic colitis   . GERD (gastroesophageal reflux disease)   . HOH (hard of hearing)   . Hypercholesterolemia   . Hypertension     Past Surgical History:  Procedure Laterality Date  . CATARACT EXTRACTION W/PHACO Left 03/07/2014   Procedure: CATARACT EXTRACTION PHACO AND INTRAOCULAR LENS PLACEMENT LEFT EYE CDE=7.11;  Surgeon: Loraine Leriche T. Nile Riggs, MD;  Location: [facility];  Service: Ophthalmology;  Laterality: Left;  . CATARACT EXTRACTION W/PHACO Right 03/14/2014   Procedure: CATARACT EXTRACTION PHACO AND INTRAOCULAR LENS PLACEMENT RIGHT EYE CDE=10.62;  Surgeon: Loraine Leriche T. Nile Riggs, MD;  Location: [facility];  Service: Ophthalmology;  Laterality: Right;  . COLONOSCOPY  2009   Dr. Darrick Penna: pancolonic diverticulosis, internal hemorrhoids  . COLONOSCOPY N/A 03/29/2015   RMR: inflammatory changes in the proximal rectum, colon and  terminal ileum suspicious for inflammatory bowel disease. ie. primarily Crohns colitis. Status post biopsy as described. concomitant NSAID exposure could excaerbate pre-existing inflammatory bowel disease but I do not think todays finding are primarly related to recent ibuprofen use.   Marland Kitchen KNEE  ARTHROSCOPY Left   . RESECTION DISTAL CLAVICAL Left 10/15/2012   Procedure: RESECTION DISTAL CLAVICAL;  Surgeon: Vickki Hearing, MD;  Location: [facility];  Service: Orthopedics;  Laterality: Left;  . SHOULDER ACROMIOPLASTY Left 10/15/2012   Procedure: SHOULDER ACROMIOPLASTY;  Surgeon: Vickki Hearing, MD;  Location: [facility];  Service: Orthopedics;  Laterality: Left;  . SHOULDER ARTHROSCOPY WITH OPEN ROTATOR CUFF REPAIR Left 10/15/2012   Procedure: SHOULDER ARTHROSCOPY WITH OPEN ROTATOR CUFF REPAIR;  Surgeon: Vickki Hearing, MD;  Location: [facility];  Service: Orthopedics;  Laterality: Left;  open at 0912; with bicep tendon debridement;   . SHOULDER OPEN ROTATOR CUFF REPAIR Right 08/20/2017   Procedure: ROTATOR CUFF REPAIR SHOULDER OPEN with removal of distal clavicle;  Surgeon: Vickki Hearing, MD;  Location: [facility];  Service: Orthopedics;  Laterality: Right;  . TONSILLECTOMY      Family History  Problem Relation Age of Onset  . Arthritis Other   . Cancer Other   . Diabetes Other   . Cancer Mother   . Colon cancer Neg Hx     SOCIAL HISTORY: Social History   Tobacco Use  . Smoking status: Former Smoker    Packs/day: 3.00    Years: 30.00    Pack years: 90.00    Types: Cigarettes    Quit date: 10/11/1993    Years since quitting: 25.9  . Smokeless tobacco: Former Neurosurgeon  . Tobacco comment: Quit approx 20 years ago  Substance Use Topics  . Alcohol use: No    Alcohol/week: 0.0 standard drinks    No Known Allergies  Current Outpatient Medications  Medication Sig Dispense Refill  . amLODipine (NORVASC) 2.5 MG tablet Take 2.5 mg by mouth daily.     Marland Kitchen aspirin EC 81 MG tablet Take 81 mg by mouth daily.     Marland Kitchen bismuth subsalicylate (PEPTO BISMOL) 262 MG chewable tablet Chew 262 mg by mouth as needed.    . cholecalciferol (VITAMIN D) 1000 units tablet Take 1,000 Units by mouth daily.    . cilostazol (PLETAL) 100 MG tablet Take 1 tablet (100 mg total) by mouth 2 (two) times daily  before a meal. 60 tablet 11  . [employer] Liver Oil 1000 MG CAPS Take 1 capsule by mouth daily.    . Cyanocobalamin (VITAMIN B-12) 1000 MCG/15ML LIQD Inject 1,000 mcg into the muscle every 30 (thirty) days. Once a month    . Cyanocobalamin (VITAMIN B12 PO) Take 1 tablet by mouth daily.    Marland Kitchen donepezil (ARICEPT) 10 MG tablet Take 10 mg by mouth at bedtime.   1  . Fiber POWD Take by mouth. 1 tsp daily    . fish oil-omega-3 fatty acids 1000 MG capsule Take 1 g by mouth 2 (two) times daily.     . fluticasone (FLONASE) 50 MCG/ACT nasal spray Place 2 sprays into both nostrils daily.    Marland Kitchen gabapentin (NEURONTIN) 100 MG capsule Take 100 mg by mouth 3 (three) times daily.    . insulin aspart (NOVOLOG) 100 UNIT/ML injection Inject 24-36 Units into the skin See admin instructions. 24 units at lunch time, and 36 units at dinner    . insulin detemir (LEVEMIR) 100 UNIT/ML injection Inject 36-55 Units into the skin 2 (two) times daily. 55 units each morning, and 36 units at bedtime    . lisinopril (PRINIVIL,ZESTRIL) 40 MG tablet Take 40 mg by mouth daily.    Marland Kitchen loperamide (IMODIUM A-D) 2 MG tablet Take 2 mg by mouth as needed for diarrhea or loose stools.    Marland Kitchen loratadine (CLARITIN) 10 MG tablet Take 10 mg by mouth daily.    . meloxicam (MOBIC) 7.5 MG tablet TAKE 1 TABLET(7.5 MG) BY MOUTH DAILY 30 tablet 5  . memantine (NAMENDA) 5 MG tablet Take 5 mg by mouth 2 (two) times daily.    . Multiple Vitamin (MULTIVITAMIN WITH MINERALS) TABS tablet Take 1 tablet by mouth daily.    . niacin (SLO-NIACIN) 500 MG tablet Take 500 mg by mouth at bedtime.    . Omega-3 Fatty Acids (FISH OIL) 1000 MG CAPS Take 1 capsule by mouth daily.    . pantoprazole (PROTONIX) 40 MG tablet Take 40 mg by mouth daily.    . QUEtiapine (SEROQUEL) 50 MG tablet Take 50 mg by mouth at bedtime.    . Semaglutide (RYBELSUS) 3 MG TABS Take 1 tablet by mouth daily.    . traMADol (ULTRAM) 50 MG tablet Take 1 tablet (50 mg total) by mouth every 6 (six) hours as  needed. 60 tablet 5   No current facility-administered medications for this visit.    REVIEW OF SYSTEMS:  [X]  denotes positive finding, [ ]  denotes negative finding Cardiac  Comments:  Chest pain or chest pressure:    Shortness of breath upon exertion:    Short of breath when lying flat:    Irregular heart rhythm:        Vascular    Pain in calf, thigh, or hip brought on by ambulation:    Pain in feet at night that wakes you up from your sleep:  Blood clot in your veins:    Leg swelling:         Pulmonary    Oxygen at home:    Productive cough:     Wheezing:         Neurologic    Sudden weakness in arms or legs:     Sudden numbness in arms or legs:     Sudden onset of difficulty speaking or slurred speech:    Temporary loss of vision in one eye:     Problems with dizziness:         Gastrointestinal    Blood in stool:     Vomited blood:         Genitourinary    Burning when urinating:     Blood in urine:        Psychiatric    Major depression:         Hematologic    Bleeding problems:    Problems with blood clotting too easily:        Skin    Rashes or ulcers:        Constitutional    Fever or chills:      PHYSICAL EXAM: Vitals:   09/20/19 1335  BP: 136/74  Pulse: (!) 105  Resp: 18  Temp: 98.1 F (36.7 C)  TempSrc: Temporal  SpO2: 99%  Weight: 180 lb (81.6 kg)  Height: 5\' 10"  (1.778 m)    GENERAL: The patient is a well-nourished male, in no acute distress. The vital signs are documented above. CARDIAC: There is a regular rate and rhythm.  VASCULAR:  2+ radial pulse palpable bilateral upper extremities Palpable femoral pulses No palpable pedal pulses PULMONARY: There is good air exchange bilaterally without wheezing or rales. ABDOMEN: Soft and non-tender with normal pitched bowel sounds.  MUSCULOSKELETAL: There are no major deformities or cyanosis. NEUROLOGIC: No focal weakness or paresthesias are detected.   DATA:   Noninvasive  imaging shows an ABI of 0.66 with a monophasic waveform on the right and an ABI 0.71 on the left with monophasic waveform  Assessment/Plan:  86 year old male with peripheral vascular disease and intermittent claudication that was previously followed by Dr. 97.  He presents for 1 year follow-up.  He still taking Pletal.  No significant progression of symptoms and no overt rest pain or tissue loss.  Given further decline in his dementia and no significant symptoms certainly no role for intervention.  Discussed with wife that we could have her just follow-up as needed versus another follow-up in 1 year and ultimately we elected for 1 year follow-up.  Discussed that if his legs are doing great in 1 year that she can always call to cancel the appointment because without any significant symptoms would be no role for any intervention.   Imogene Burn, MD [facility] Office: [phone] Pager: [phone]

## 2019-09-21 ENCOUNTER — Other Ambulatory Visit: Payer: Self-pay | Admitting: *Deleted

## 2019-09-21 DIAGNOSIS — I70219 Atherosclerosis of native arteries of extremities with intermittent claudication, unspecified extremity: Secondary | ICD-10-CM

## 2019-10-18 ENCOUNTER — Other Ambulatory Visit: Payer: Self-pay | Admitting: Neurology

## 2019-10-18 ENCOUNTER — Other Ambulatory Visit (HOSPITAL_COMMUNITY): Payer: Self-pay | Admitting: Neurology

## 2019-10-18 DIAGNOSIS — I6200 Nontraumatic subdural hemorrhage, unspecified: Secondary | ICD-10-CM

## 2019-10-31 ENCOUNTER — Ambulatory Visit (HOSPITAL_COMMUNITY)
Admission: RE | Admit: 2019-10-31 | Discharge: 2019-10-31 | Disposition: A | Discharge status: Home / Self Care | Payer: Medicare Other | Source: Ambulatory Visit | Attending: Neurology | Admitting: Neurology

## 2019-10-31 ENCOUNTER — Other Ambulatory Visit: Payer: Self-pay

## 2019-10-31 DIAGNOSIS — I6200 Nontraumatic subdural hemorrhage, unspecified: Secondary | ICD-10-CM | POA: Insufficient documentation

## 2020-02-24 ENCOUNTER — Other Ambulatory Visit: Payer: Self-pay | Admitting: Orthopedic Surgery

## 2020-02-24 DIAGNOSIS — G8929 Other chronic pain: Secondary | ICD-10-CM

## 2020-02-24 DIAGNOSIS — M5440 Lumbago with sciatica, unspecified side: Secondary | ICD-10-CM

## 2020-03-25 ENCOUNTER — Other Ambulatory Visit: Payer: Self-pay | Admitting: Vascular Surgery

## 2020-05-28 ENCOUNTER — Other Ambulatory Visit: Payer: Self-pay | Admitting: Neurology

## 2020-05-28 DIAGNOSIS — R296 Repeated falls: Secondary | ICD-10-CM

## 2020-06-05 ENCOUNTER — Ambulatory Visit (HOSPITAL_COMMUNITY)
Admission: RE | Admit: 2020-06-05 | Discharge: 2020-06-05 | Disposition: A | Discharge status: Home / Self Care | Payer: Medicare Other | Source: Ambulatory Visit | Attending: Neurology | Admitting: Neurology

## 2020-06-05 ENCOUNTER — Other Ambulatory Visit: Payer: Self-pay

## 2020-06-05 DIAGNOSIS — R296 Repeated falls: Secondary | ICD-10-CM | POA: Diagnosis present

## 2020-07-13 ENCOUNTER — Other Ambulatory Visit (HOSPITAL_COMMUNITY): Payer: Self-pay | Admitting: Specialist

## 2020-07-13 DIAGNOSIS — R471 Dysarthria and anarthria: Secondary | ICD-10-CM

## 2020-07-20 ENCOUNTER — Telehealth (HOSPITAL_COMMUNITY): Payer: Self-pay | Admitting: Speech Pathology

## 2020-07-20 NOTE — Telephone Encounter (Signed)
Pt will call us back to r/s after she talks to her son for transportation

## 2020-07-25 ENCOUNTER — Ambulatory Visit (HOSPITAL_COMMUNITY): Payer: Medicare Other | Admitting: Speech Pathology

## 2020-07-25 ENCOUNTER — Other Ambulatory Visit (HOSPITAL_COMMUNITY): Payer: Medicare Other

## 2020-07-30 ENCOUNTER — Ambulatory Visit (HOSPITAL_COMMUNITY): Payer: Medicare Other | Admitting: Speech Pathology

## 2020-07-31 ENCOUNTER — Ambulatory Visit (HOSPITAL_COMMUNITY)
Admission: RE | Admit: 2020-07-31 | Discharge: 2020-07-31 | Disposition: A | Discharge status: Home / Self Care | Payer: Medicare Other | Source: Ambulatory Visit | Attending: Neurology | Admitting: Neurology

## 2020-07-31 ENCOUNTER — Other Ambulatory Visit: Payer: Self-pay

## 2020-07-31 ENCOUNTER — Ambulatory Visit (HOSPITAL_COMMUNITY): Payer: Medicare Other | Attending: Neurology | Admitting: Speech Pathology

## 2020-07-31 ENCOUNTER — Encounter (HOSPITAL_COMMUNITY): Payer: Self-pay | Admitting: Speech Pathology

## 2020-07-31 DIAGNOSIS — R1312 Dysphagia, oropharyngeal phase: Secondary | ICD-10-CM | POA: Insufficient documentation

## 2020-07-31 DIAGNOSIS — R471 Dysarthria and anarthria: Secondary | ICD-10-CM | POA: Insufficient documentation

## 2020-07-31 NOTE — Therapy (Signed)
[facility] [address] [place], [place], [address] Phone: [phone]   Fax:  [phone]  Modified Barium Swallow  Patient Details  Name: [name] MRN: [id number] Date of Birth: [birth year] No data recorded  Encounter Date: 07/31/2020   End of Session - 07/31/20 1812    Visit Number 1    Number of Visits 1    Authorization Type [employer] Medicare    SLP Start Time 1336    SLP Stop Time  1402    SLP Time Calculation (min) 26 min    Activity Tolerance Patient tolerated treatment well           Past Medical History:  Diagnosis Date  . Arthritis   . Dementia (HCC)    early stages  . Diabetes mellitus   . Eosinophilic colitis   . GERD (gastroesophageal reflux disease)   . HOH (hard of hearing)   . Hypercholesterolemia   . Hypertension     Past Surgical History:  Procedure Laterality Date  . CATARACT EXTRACTION W/PHACO Left 03/07/2014   Procedure: CATARACT EXTRACTION PHACO AND INTRAOCULAR LENS PLACEMENT LEFT EYE CDE=7.11;  Surgeon: Loraine Leriche T. Nile Riggs, MD;  Location: [facility];  Service: Ophthalmology;  Laterality: Left;  . CATARACT EXTRACTION W/PHACO Right 03/14/2014   Procedure: CATARACT EXTRACTION PHACO AND INTRAOCULAR LENS PLACEMENT RIGHT EYE CDE=10.62;  Surgeon: Loraine Leriche T. Nile Riggs, MD;  Location: [facility];  Service: Ophthalmology;  Laterality: Right;  . COLONOSCOPY  2009   Dr. Darrick Penna: pancolonic diverticulosis, internal hemorrhoids  . COLONOSCOPY N/A 03/29/2015   RMR: inflammatory changes in the proximal rectum, colon and  terminal ileum suspicious for inflammatory bowel disease. ie. primarily Crohns colitis. Status post biopsy as described. concomitant NSAID exposure could excaerbate pre-existing inflammatory bowel disease but I do not think todays finding are primarly related to recent ibuprofen use.   Marland Kitchen KNEE ARTHROSCOPY Left   . RESECTION DISTAL CLAVICAL Left 10/15/2012   Procedure: RESECTION DISTAL CLAVICAL;  Surgeon: Vickki Hearing, MD;  Location: [facility];  Service: Orthopedics;  Laterality: Left;  . SHOULDER ACROMIOPLASTY Left 10/15/2012   Procedure: SHOULDER ACROMIOPLASTY;  Surgeon: Vickki Hearing, MD;  Location: [facility];  Service: Orthopedics;  Laterality: Left;  . SHOULDER ARTHROSCOPY WITH OPEN ROTATOR CUFF REPAIR Left 10/15/2012   Procedure: SHOULDER ARTHROSCOPY WITH OPEN ROTATOR CUFF REPAIR;  Surgeon: Vickki Hearing, MD;  Location: [facility];  Service: Orthopedics;  Laterality: Left;  open at 0912; with bicep tendon debridement;   . SHOULDER OPEN ROTATOR CUFF REPAIR Right 08/20/2017   Procedure: ROTATOR CUFF REPAIR SHOULDER OPEN with removal of distal clavicle;  Surgeon: Vickki Hearing, MD;  Location: [facility];  Service: Orthopedics;  Laterality: Right;  . TONSILLECTOMY      There were no vitals filed for this visit.   Subjective Assessment - 07/31/20 1802    Subjective "I have trouble swallowing my pills."    Special Tests MBSS    Currently in Pain? No/denies               General - 07/31/20 1803      General Information   Date of Onset 07/04/20    HPI [name] is an 86 yo male who was referred for MBSS by Dr. Beryle Beams due to Pt/family reports of dysphagia. Pt with past medical history significant for dementia, Parkinson's disease, and Lewy Body dementia. [name] tells SLP that Pt's lower dentures no longer fit well due to receding gum line.  Type of Study MBS-Modified Barium Swallow Study    Diet Prior to this Study Dysphagia 3 (soft);Thin liquids    Temperature Spikes Noted No    Respiratory Status Room air    History of Recent Intubation No    Behavior/Cognition Alert;Cooperative;Pleasant mood    Oral Cavity Assessment Within Functional Limits    Oral Care Completed by SLP No    Oral Cavity - Dentition Dentures, top;Dentures, bottom    Vision Functional for self feeding    Self-Feeding Abilities Able to feed self    Patient Positioning Upright in chair    Baseline  Vocal Quality Low vocal intensity;Wet   mild wet vocal quality   Volitional Cough Cognitively unable to elicit    Volitional Swallow Able to elicit    Anatomy Within functional limits    Pharyngeal Secretions Not observed secondary MBS              Oral Preparation/Oral Phase - 07/31/20 1805      Oral Preparation/Oral Phase   Oral Phase Impaired      Oral - Thin   Oral - Thin Teaspoon Within functional limits    Oral - Thin Cup Within functional limits;Oral residue    Oral - Thin Straw Within functional limits      Oral - Solids   Oral - Puree Oral residue    Oral - Regular Within functional limits    Oral - Pill Within functional limits      Electrical stimulation - Oral Phase   Was Electrical Stimulation Used No            Pharyngeal Phase - 07/31/20 1806      Pharyngeal Phase   Pharyngeal Phase Impaired      Pharyngeal - Thin   Pharyngeal- Thin Teaspoon Penetration/Aspiration during swallow;Swallow initiation at vallecula    Pharyngeal Material does not enter airway;Material enters airway, remains ABOVE vocal cords then ejected out    Pharyngeal- Thin Cup Swallow initiation at vallecula;Reduced epiglottic inversion;Pharyngeal residue - valleculae;Lateral channel residue    Pharyngeal- Thin Straw Pharyngeal residue - valleculae;Lateral channel residue      Pharyngeal - Solids   Pharyngeal- Puree Reduced epiglottic inversion;Pharyngeal residue - valleculae    Pharyngeal- Regular Within functional limits    Pharyngeal- Pill Within functional limits      Pharyngeal Phase - Comment   Pharyngeal Comment liquid oral residuals pool in valleculae and lateral channels after the initial swallow, but are cleared with a repeat/dry swallow      Electrical Stimulation - Pharyngeal Phase   Was Electrical Stimulation Used No            Cricopharyngeal Phase - 07/31/20 1810      Cervical Esophageal Phase   Cervical Esophageal Phase Impaired      Cervical Esophageal  Phase - Thin   Thin Cup Prominent cricopharyngeal segment;Other (Comment)      Cervical Esophageal Phase - Comment   Cervical Esophageal Comment Prominent CP, small Zenker's diverticulum confirmed by radiologist (collected small amount of constrast)    Other Esophageal Phase Observations WNL             Plan - 07/31/20 1813    Clinical Impression Statement Pt presents with mild oropharyngeal dysphagia characterized by min oral liquid residuals retained on the floor of the mouth/under dentures which eventually pool in the valleculae and lateral channels, min decrease in epiglottic deflection (tip does not always completely invert, and a single episode of flash penetration  of thins when taking a teaspoon presentation (normal), and no aspiration. Pt with oral residuals which pool in the valleculae and lateral channels, but clear with a dry swallow (he occasionally required verbal cue to swallow again). Pt noted to have a prominent cricopharyngeal bar and small Zenker's which retained a small amount of contrast, but otherwise did not appear to impact pharyngeal swallow. Recommend regular textures and slow cooked/soft meats as Pt's wife indicates that he tolerates these better, thin liquids. Pt to swallow 2x for each bite/sip to clear pharyngeal residuals (he did this spontaneously 75% of the time); standard aspiration and reflux precautions, continue with good oral care. Pt may want to try eating his foods without his lower dentures as they do not fit well per wife. No further SLP services indicated at this time.    Treatment/Interventions Aspiration precaution training    Potential to Achieve Goals Good    Consulted and Agree with Plan of Care Patient;Family member/caregiver    Family Member Consulted Spouse           Patient will benefit from skilled therapeutic intervention in order to improve the following deficits and impairments:   Dysphagia, oropharyngeal phase      Recommendations/Treatment - 07/31/20 1811      Swallow Evaluation Recommendations   SLP Diet Recommendations Thin;Age appropriate regular;Dysphagia 3 (mechanical soft)    Liquid Administration via Cup;Straw    Medication Administration Whole meds with liquid    Supervision Patient able to self feed    Compensations Multiple dry swallows after each bite/sip    Postural Changes Seated upright at 90 degrees;Remain upright for at least 30 minutes after feeds/meals            Prognosis - 07/31/20 1812      Prognosis   Prognosis for Safe Diet Advancement Good      Individuals Consulted   Consulted and Agree with Results and Recommendations Patient;Family member/caregiver    Family Member Consulted spouse    Report Sent to  Referring physician           Problem List Patient Active Problem List   Diagnosis Date Noted  . Diarrhea 04/23/2018  . Abdominal pain 04/23/2018  . Atherosclerosis of artery of extremity with intermittent claudication (HCC) 03/03/2018  . AC joint arthropathy   . Eosinophilic colitis 62/83/1517  . IBD (inflammatory bowel disease)   . Mucosal abnormality of colon   . Diverticulosis of colon with hemorrhage   . Intractable diarrhea   . Leukocytosis   . Acute renal failure (HCC) 03/25/2015  . Diabetes (HCC) 03/25/2015  . Chronic diarrhea of unknown origin 03/25/2015  . Rotator cuff tear 01/04/2013  . S/P rotator cuff repair right  08/20/17 01/04/2013  . Arthritis, shoulder region 10/15/2012  . Biceps tendonitis on left 10/15/2012  . Adhesive bursitis of left shoulder 09/01/2012  . Partial tear of rotator cuff(726.13) 09/01/2012  . Pain in joint, shoulder region 07/09/2011  . Muscle weakness (generalized) 07/09/2011  . Bursitis of shoulder, left 07/03/2011   Thank you,  Havery Moros, CCC-SLP [phone]  [facility] 07/31/2020, 6:23 PM  [facility] [address] [place], [place], [address] Phone:  [phone]   Fax:  [phone]  Name: [name] MRN: [id number] Date of Birth: [birth year]

## 2020-09-02 ENCOUNTER — Other Ambulatory Visit: Payer: Self-pay | Admitting: Orthopedic Surgery

## 2020-09-02 DIAGNOSIS — G8929 Other chronic pain: Secondary | ICD-10-CM

## 2020-09-02 DIAGNOSIS — M5440 Lumbago with sciatica, unspecified side: Secondary | ICD-10-CM

## 2020-12-31 ENCOUNTER — Emergency Department (HOSPITAL_COMMUNITY)
Admission: EM | Admit: 2020-12-31 | Discharge: 2020-12-31 | Disposition: A | Discharge status: Home / Self Care | Payer: Medicare Other | Attending: Emergency Medicine | Admitting: Emergency Medicine

## 2020-12-31 ENCOUNTER — Encounter (HOSPITAL_COMMUNITY): Payer: Self-pay

## 2020-12-31 ENCOUNTER — Other Ambulatory Visit: Payer: Self-pay

## 2020-12-31 ENCOUNTER — Emergency Department (HOSPITAL_COMMUNITY): Payer: Medicare Other

## 2020-12-31 DIAGNOSIS — Z794 Long term (current) use of insulin: Secondary | ICD-10-CM | POA: Insufficient documentation

## 2020-12-31 DIAGNOSIS — M25562 Pain in left knee: Secondary | ICD-10-CM | POA: Insufficient documentation

## 2020-12-31 DIAGNOSIS — Z87891 Personal history of nicotine dependence: Secondary | ICD-10-CM | POA: Diagnosis not present

## 2020-12-31 DIAGNOSIS — Z79899 Other long term (current) drug therapy: Secondary | ICD-10-CM | POA: Insufficient documentation

## 2020-12-31 DIAGNOSIS — E119 Type 2 diabetes mellitus without complications: Secondary | ICD-10-CM | POA: Diagnosis not present

## 2020-12-31 DIAGNOSIS — W19XXXA Unspecified fall, initial encounter: Secondary | ICD-10-CM

## 2020-12-31 DIAGNOSIS — G20 Parkinson's disease: Secondary | ICD-10-CM | POA: Insufficient documentation

## 2020-12-31 DIAGNOSIS — R000 Tachycardia, unspecified: Secondary | ICD-10-CM | POA: Insufficient documentation

## 2020-12-31 DIAGNOSIS — R296 Repeated falls: Secondary | ICD-10-CM | POA: Insufficient documentation

## 2020-12-31 DIAGNOSIS — F0390 Unspecified dementia without behavioral disturbance: Secondary | ICD-10-CM | POA: Diagnosis not present

## 2020-12-31 DIAGNOSIS — Z7982 Long term (current) use of aspirin: Secondary | ICD-10-CM | POA: Diagnosis not present

## 2020-12-31 DIAGNOSIS — I10 Essential (primary) hypertension: Secondary | ICD-10-CM | POA: Diagnosis not present

## 2020-12-31 DIAGNOSIS — R2243 Localized swelling, mass and lump, lower limb, bilateral: Secondary | ICD-10-CM | POA: Insufficient documentation

## 2020-12-31 LAB — CBC WITH DIFFERENTIAL/PLATELET
Abs Immature Granulocytes: 0.11 10*3/uL — ABNORMAL HIGH (ref 0.00–0.07)
Basophils Absolute: 0 10*3/uL (ref 0.0–0.1)
Basophils Relative: 0 %
Eosinophils Absolute: 0 10*3/uL (ref 0.0–0.5)
Eosinophils Relative: 0 %
HCT: 34.1 % — ABNORMAL LOW (ref 39.0–52.0)
Hemoglobin: 11.2 g/dL — ABNORMAL LOW (ref 13.0–17.0)
Immature Granulocytes: 2 %
Lymphocytes Relative: 26 %
Lymphs Abs: 1.8 10*3/uL (ref 0.7–4.0)
MCH: 29.2 pg (ref 26.0–34.0)
MCHC: 32.8 g/dL (ref 30.0–36.0)
MCV: 89 fL (ref 80.0–100.0)
Monocytes Absolute: 0.7 10*3/uL (ref 0.1–1.0)
Monocytes Relative: 10 %
Neutro Abs: 4.3 10*3/uL (ref 1.7–7.7)
Neutrophils Relative %: 62 %
Platelets: 114 10*3/uL — ABNORMAL LOW (ref 150–400)
RBC: 3.83 MIL/uL — ABNORMAL LOW (ref 4.22–5.81)
RDW: 14.4 % (ref 11.5–15.5)
WBC: 6.9 10*3/uL (ref 4.0–10.5)
nRBC: 0 % (ref 0.0–0.2)

## 2020-12-31 LAB — BASIC METABOLIC PANEL
Anion gap: 8 (ref 5–15)
BUN: 23 mg/dL (ref 8–23)
CO2: 22 mmol/L (ref 22–32)
Calcium: 8.8 mg/dL — ABNORMAL LOW (ref 8.9–10.3)
Chloride: 104 mmol/L (ref 98–111)
Creatinine, Ser: 1.05 mg/dL (ref 0.61–1.24)
GFR, Estimated: >60 mL/min (ref >60)
Glucose, Bld: 87 mg/dL (ref 70–99)
Potassium: 4.2 mmol/L (ref 3.5–5.1)
Sodium: 134 mmol/L — ABNORMAL LOW (ref 135–145)

## 2020-12-31 NOTE — Discharge Instructions (Signed)
You were seen in the emergency department for evaluation of left knee pain and frequent falls.  Your lab work did not show any significant abnormalities.  The x-ray of your chest pelvis left hip and left knee did not show any acute traumatic findings.  Please use ice to your knee and you can take Tylenol as needed for pain.  Follow-up with your doctor.  Return to the emergency department for any worsening or concerning symptoms.

## 2020-12-31 NOTE — ED Provider Notes (Signed)
[facility] EMERGENCY DEPARTMENT Provider Note   CSN: [id number] Arrival date & time: 12/31/20  2034     History Chief Complaint  Patient presents with  . Fall    [name] is a 87 y.o. male.  He is brought in by his wife who is giving most of the history.  He has a history of dementia and Parkinson's.  Level 5 caveat.  He has frequent falls, uses a walker.  He fell yesterday and she lost her balance fell hit her head.  She said he is fallen twice more today.  Now is having difficulty getting up from sitting due to pain in his left knee.  She denies that he struck his head or loss consciousness.  He is not on any blood thinners.  She denies any recent medication changes.  She said he is been drinking well but does not eat very much.  The history is provided by the patient and the spouse.  Fall This is a recurrent problem. The current episode started 1 to 2 hours ago. The problem occurs every several days. The problem has been gradually worsening. The symptoms are aggravated by bending. Nothing relieves the symptoms. He has tried rest for the symptoms. The treatment provided no relief.       Past Medical History:  Diagnosis Date  . Arthritis   . Dementia (HCC)    early stages  . Diabetes mellitus   . Eosinophilic colitis   . GERD (gastroesophageal reflux disease)   . HOH (hard of hearing)   . Hypercholesterolemia   . Hypertension     Patient Active Problem List   Diagnosis Date Noted  . Diarrhea 04/23/2018  . Abdominal pain 04/23/2018  . Atherosclerosis of artery of extremity with intermittent claudication (HCC) 03/03/2018  . AC joint arthropathy   . Eosinophilic colitis 78/29/5621  . IBD (inflammatory bowel disease)   . Mucosal abnormality of colon   . Diverticulosis of colon with hemorrhage   . Intractable diarrhea   . Leukocytosis   . Acute renal failure (HCC) 03/25/2015  . Diabetes (HCC) 03/25/2015  . Chronic diarrhea of unknown origin 03/25/2015  . Rotator  cuff tear 01/04/2013  . S/P rotator cuff repair right  08/20/17 01/04/2013  . Arthritis, shoulder region 10/15/2012  . Biceps tendonitis on left 10/15/2012  . Adhesive bursitis of left shoulder 09/01/2012  . Partial tear of rotator cuff(726.13) 09/01/2012  . Pain in joint, shoulder region 07/09/2011  . Muscle weakness (generalized) 07/09/2011  . Bursitis of shoulder, left 07/03/2011    Past Surgical History:  Procedure Laterality Date  . CATARACT EXTRACTION W/PHACO Left 03/07/2014   Procedure: CATARACT EXTRACTION PHACO AND INTRAOCULAR LENS PLACEMENT LEFT EYE CDE=7.11;  Surgeon: Loraine Leriche T. Nile Riggs, MD;  Location: [facility];  Service: Ophthalmology;  Laterality: Left;  . CATARACT EXTRACTION W/PHACO Right 03/14/2014   Procedure: CATARACT EXTRACTION PHACO AND INTRAOCULAR LENS PLACEMENT RIGHT EYE CDE=10.62;  Surgeon: Loraine Leriche T. Nile Riggs, MD;  Location: [facility];  Service: Ophthalmology;  Laterality: Right;  . COLONOSCOPY  2009   Dr. Darrick Penna: pancolonic diverticulosis, internal hemorrhoids  . COLONOSCOPY N/A 03/29/2015   RMR: inflammatory changes in the proximal rectum, colon and  terminal ileum suspicious for inflammatory bowel disease. ie. primarily Crohns colitis. Status post biopsy as described. concomitant NSAID exposure could excaerbate pre-existing inflammatory bowel disease but I do not think todays finding are primarly related to recent ibuprofen use.   Marland Kitchen KNEE ARTHROSCOPY Left   . RESECTION DISTAL CLAVICAL Left 10/15/2012  Procedure: RESECTION DISTAL CLAVICAL;  Surgeon: Vickki Hearing, MD;  Location: [facility];  Service: Orthopedics;  Laterality: Left;  . SHOULDER ACROMIOPLASTY Left 10/15/2012   Procedure: SHOULDER ACROMIOPLASTY;  Surgeon: Vickki Hearing, MD;  Location: [facility];  Service: Orthopedics;  Laterality: Left;  . SHOULDER ARTHROSCOPY WITH OPEN ROTATOR CUFF REPAIR Left 10/15/2012   Procedure: SHOULDER ARTHROSCOPY WITH OPEN ROTATOR CUFF REPAIR;  Surgeon: Vickki Hearing, MD;  Location: [facility];  Service: Orthopedics;  Laterality: Left;  open at 0912; with bicep tendon debridement;   . SHOULDER OPEN ROTATOR CUFF REPAIR Right 08/20/2017   Procedure: ROTATOR CUFF REPAIR SHOULDER OPEN with removal of distal clavicle;  Surgeon: Vickki Hearing, MD;  Location: [facility];  Service: Orthopedics;  Laterality: Right;  . TONSILLECTOMY         Family History  Problem Relation Age of Onset  . Arthritis Other   . Cancer Other   . Diabetes Other   . Cancer Mother   . Colon cancer Neg Hx     Social History   Tobacco Use  . Smoking status: Former Smoker    Packs/day: 3.00    Years: 30.00    Pack years: 90.00    Types: Cigarettes    Quit date: 10/11/1993    Years since quitting: 27.2  . Smokeless tobacco: Former Neurosurgeon  . Tobacco comment: Quit approx 20 years ago  Vaping Use  . Vaping Use: Never used  Substance Use Topics  . Alcohol use: No    Alcohol/week: 0.0 standard drinks  . Drug use: No    Home Medications Prior to Admission medications   Medication Sig Start Date End Date Taking? Authorizing Provider  amLODipine (NORVASC) 2.5 MG tablet Take 2.5 mg by mouth daily.  12/11/17   [provider]  aspirin EC 81 MG tablet Take 81 mg by mouth daily.     [provider]  bismuth subsalicylate (PEPTO BISMOL) 262 MG chewable tablet Chew 262 mg by mouth as needed.    [provider]  cholecalciferol (VITAMIN D) 1000 units tablet Take 1,000 Units by mouth daily.    [provider]  cilostazol (PLETAL) 100 MG tablet TAKE 1 TABLET(100 MG) BY MOUTH TWICE DAILY BEFORE A MEAL 03/26/20   Maeola Harman, MD  [facility] Liver Oil 1000 MG CAPS Take 1 capsule by mouth daily.    [provider]  Cyanocobalamin (VITAMIN B-12) 1000 MCG/15ML LIQD Inject 1,000 mcg into the muscle every 30 (thirty) days. Once a month    [provider]  Cyanocobalamin (VITAMIN B12 PO) Take 1 tablet by mouth daily.    [provider]  donepezil  (ARICEPT) 10 MG tablet Take 10 mg by mouth at bedtime.  07/16/17   [provider]  Fiber POWD Take by mouth. 1 tsp daily    [provider]  fish oil-omega-3 fatty acids 1000 MG capsule Take 1 g by mouth 2 (two) times daily.     [provider]  fluticasone (FLONASE) 50 MCG/ACT nasal spray Place 2 sprays into both nostrils daily.    [provider]  gabapentin (NEURONTIN) 100 MG capsule Take 100 mg by mouth 3 (three) times daily.    [provider]  insulin aspart (NOVOLOG) 100 UNIT/ML injection Inject 24-36 Units into the skin See admin instructions. 24 units at lunch time, and 36 units at dinner    [provider]  insulin detemir (LEVEMIR) 100 UNIT/ML injection Inject 36-55 Units  into the skin 2 (two) times daily. 55 units each morning, and 36 units at bedtime    [provider]  lisinopril (PRINIVIL,ZESTRIL) 40 MG tablet Take 40 mg by mouth daily.    [provider]  loperamide (IMODIUM A-D) 2 MG tablet Take 2 mg by mouth as needed for diarrhea or loose stools.    [provider]  loratadine (CLARITIN) 10 MG tablet Take 10 mg by mouth daily.    [provider]  meloxicam (MOBIC) 7.5 MG tablet TAKE 1 TABLET(7.5 MG) BY MOUTH DAILY 09/03/20   Vickki Hearing, MD  memantine (NAMENDA) 5 MG tablet Take 5 mg by mouth 2 (two) times daily.    [provider]  Multiple Vitamin (MULTIVITAMIN WITH MINERALS) TABS tablet Take 1 tablet by mouth daily.    [provider]  niacin (SLO-NIACIN) 500 MG tablet Take 500 mg by mouth at bedtime.    [provider]  Omega-3 Fatty Acids (FISH OIL) 1000 MG CAPS Take 1 capsule by mouth daily.    [provider]  pantoprazole (PROTONIX) 40 MG tablet Take 40 mg by mouth daily.    [provider]  QUEtiapine (SEROQUEL) 50 MG tablet Take 50 mg by mouth at bedtime.    [provider]  Semaglutide (RYBELSUS) 3 MG TABS Take 1 tablet  by mouth daily.    [provider]  traMADol (ULTRAM) 50 MG tablet Take 1 tablet (50 mg total) by mouth every 6 (six) hours as needed. 02/10/19   Vickki Hearing, MD    Allergies    Patient has no known allergies.  Review of Systems   Review of Systems  Unable to perform ROS: Dementia    Physical Exam Updated Vital Signs BP (!) 153/67   Pulse 100   Temp 98.4 F (36.9 C)   Resp 20   Ht 5\' 10"  (1.778 m)   Wt 83 kg   SpO2 100%   BMI 26.26 kg/m   Physical Exam Vitals and nursing note reviewed.  Constitutional:      Appearance: Normal appearance. He is well-developed.  HENT:     Head: Normocephalic and atraumatic.  Eyes:     Conjunctiva/sclera: Conjunctivae normal.  Cardiovascular:     Rate and Rhythm: Regular rhythm. Tachycardia present.     Heart sounds: No murmur heard.   Pulmonary:     Effort: Pulmonary effort is normal. No respiratory distress.     Breath sounds: Normal breath sounds.  Abdominal:     Palpations: Abdomen is soft.     Tenderness: There is no abdominal tenderness. There is no guarding or rebound.  Musculoskeletal:        General: Tenderness present. No deformity.     Cervical back: Neck supple.     Right lower leg: Edema present.     Left lower leg: Edema present.     Comments: He is a well-healed scar over his left knee.  It is diffusely tender.  There is no significant effusion.  There is no obvious deformities of his upper extremities and full range of motion.  Lower extremities he has some level of rigidity but no gross deformities.  He has some edema symmetric.  Skin:    General: Skin is warm and dry.  Neurological:     General: No focal deficit present.     Mental Status: He is alert. Mental status is at baseline.     Comments: He is awake and will answer  some simple questions.  He is very hard of hearing.  He is moving all extremities without any focal deficits.  He has some rigidity likely secondary to his Parkinson's.     ED  Results / Procedures / Treatments   Labs (all labs ordered are listed, but only abnormal results are displayed) Labs Reviewed  BASIC METABOLIC PANEL - Abnormal; Notable for the following components:      Result Value   Sodium 134 (*)    Calcium 8.8 (*)    All other components within normal limits  CBC WITH DIFFERENTIAL/PLATELET - Abnormal; Notable for the following components:   RBC 3.83 (*)    Hemoglobin 11.2 (*)    HCT 34.1 (*)    Platelets 114 (*)    Abs Immature Granulocytes 0.11 (*)    All other components within normal limits    EKG None  Radiology DG Chest 1 View  Result Date: 12/31/2020 CLINICAL DATA:  Fall EXAM: CHEST  1 VIEW COMPARISON:  09/07/2008 FINDINGS: Mild bronchitic changes at the bases. No consolidation or effusion. Stable cardiomediastinal silhouette with aortic atherosclerosis. No pneumothorax. IMPRESSION: No active disease.  Stable chronic bronchitic changes at the bases Electronically Signed   By: Jasmine Pang M.D.   On: 12/31/2020 22:04   DG Knee Complete 4 Views Left  Result Date: 12/31/2020 CLINICAL DATA:  Fall with pain EXAM: LEFT KNEE - COMPLETE 4+ VIEW COMPARISON:  07/30/2018 FINDINGS: Status post left knee replacement. No fracture or malalignment. Vascular calcifications. Possible small effusion. IMPRESSION: Knee replacement without acute osseous abnormality Electronically Signed   By: Jasmine Pang M.D.   On: 12/31/2020 22:04   DG Hip Unilat With Pelvis 2-3 Views Left  Result Date: 12/31/2020 CLINICAL DATA:  Fall with hip pain EXAM: DG HIP (WITH OR WITHOUT PELVIS) 2-3V LEFT COMPARISON:  07/30/2018 FINDINGS: SI joints are non widened. Pubic symphysis and rami appear intact. Both femoral heads project in joint. There are vascular calcifications. No fracture or malalignment. IMPRESSION: No acute osseous abnormality Electronically Signed   By: Jasmine Pang M.D.   On: 12/31/2020 22:02    Procedures Procedures   Medications Ordered in ED Medications -  No data to display  ED Course  I have reviewed the triage vital signs and the nursing notes.  Pertinent labs & imaging results that were available during my care of the patient were reviewed by me and considered in my medical decision making (see chart for details).  Clinical Course as of 01/01/21 0947  Mon Dec 31, 2020  2155 X-ray does not show any obvious pneumothorax.  Left hip pelvis and left knee x-ray do not show any obvious fracture or dislocation.  Awaiting radiology reading. [MB]  2203 Reviewed work-up with wife.  She would like to take him home.  She says she has home health and physical therapy coming to the house tomorrow.  Her son-in-law can help him get into the house. [MB]    Clinical Course User Index [MB] Terrilee Files, MD   MDM Rules/Calculators/A&P                         This patient complains of frequent falls, left knee pain; this involves an extensive number of treatment Options and is a complaint that carries with it a high risk of complications and Morbidity. The differential includes falls, gait instability, Parkinson's, dementia, fracture, dislocation, contusion, metabolic derangement, anemia, UTI  I ordered, reviewed and interpreted labs, which  included CBC with normal white count, hemoglobin low similar to priors, chemistries fairly unrevealing I ordered imaging studies which included chest x-ray, pelvis and left hip, left knee and I independently    visualized and interpreted imaging which showed no acute findings Additional history obtained from patient's wife Previous records obtained and reviewed in epic, no recent admissions  After the interventions stated above, I reevaluated the patient and found patient to be hemodynamically stable.  Wife would like to take him home.  No current indications for admission.  They have home health services.  Recommended close follow-up with PCP and return to the emergency department if any worsening or concerning  symptoms   Final Clinical Impression(s) / ED Diagnoses Final diagnoses:  Frequent falls  Acute pain of left knee    Rx / DC Orders ED Discharge Orders    None       Terrilee FilesButler, Lasaundra Riche C, MD 01/01/21 [id number]

## 2020-12-31 NOTE — ED Triage Notes (Signed)
pov from home with wife with cc of falls . Fell 3x yesterday and x2 today. The last fall the wife thinks he hurt his right leg because he cannot walk like usual. Pt has parkinson's and alzheimer's and is a poor historian. Wife is at bedside.

## 2021-01-30 ENCOUNTER — Encounter (HOSPITAL_COMMUNITY): Payer: Self-pay

## 2021-01-30 ENCOUNTER — Other Ambulatory Visit: Payer: Self-pay

## 2021-01-30 ENCOUNTER — Emergency Department (HOSPITAL_COMMUNITY)
Admission: EM | Admit: 2021-01-30 | Discharge: 2021-01-30 | Disposition: A | Discharge status: Home / Self Care | Payer: Medicare Other | Attending: Emergency Medicine | Admitting: Emergency Medicine

## 2021-01-30 DIAGNOSIS — F0390 Unspecified dementia without behavioral disturbance: Secondary | ICD-10-CM | POA: Diagnosis not present

## 2021-01-30 DIAGNOSIS — F02818 Dementia in other diseases classified elsewhere, unspecified severity, with other behavioral disturbance: Secondary | ICD-10-CM

## 2021-01-30 DIAGNOSIS — E162 Hypoglycemia, unspecified: Secondary | ICD-10-CM | POA: Diagnosis present

## 2021-01-30 DIAGNOSIS — G20 Parkinson's disease: Secondary | ICD-10-CM | POA: Diagnosis not present

## 2021-01-30 DIAGNOSIS — R4182 Altered mental status, unspecified: Secondary | ICD-10-CM | POA: Diagnosis not present

## 2021-01-30 DIAGNOSIS — R451 Restlessness and agitation: Secondary | ICD-10-CM | POA: Diagnosis not present

## 2021-01-30 DIAGNOSIS — R4689 Other symptoms and signs involving appearance and behavior: Secondary | ICD-10-CM

## 2021-01-30 DIAGNOSIS — Z7982 Long term (current) use of aspirin: Secondary | ICD-10-CM | POA: Insufficient documentation

## 2021-01-30 DIAGNOSIS — E11649 Type 2 diabetes mellitus with hypoglycemia without coma: Secondary | ICD-10-CM | POA: Diagnosis not present

## 2021-01-30 DIAGNOSIS — Z79899 Other long term (current) drug therapy: Secondary | ICD-10-CM | POA: Diagnosis not present

## 2021-01-30 DIAGNOSIS — Z87891 Personal history of nicotine dependence: Secondary | ICD-10-CM | POA: Insufficient documentation

## 2021-01-30 DIAGNOSIS — Z794 Long term (current) use of insulin: Secondary | ICD-10-CM | POA: Insufficient documentation

## 2021-01-30 DIAGNOSIS — I10 Essential (primary) hypertension: Secondary | ICD-10-CM | POA: Diagnosis not present

## 2021-01-30 LAB — CBC WITH DIFFERENTIAL/PLATELET
Abs Immature Granulocytes: 0.1 10*3/uL — ABNORMAL HIGH (ref 0.00–0.07)
Basophils Absolute: 0 10*3/uL (ref 0.0–0.1)
Basophils Relative: 0 %
Eosinophils Absolute: 0 10*3/uL (ref 0.0–0.5)
Eosinophils Relative: 0 %
HCT: 32 % — ABNORMAL LOW (ref 39.0–52.0)
Hemoglobin: 10.3 g/dL — ABNORMAL LOW (ref 13.0–17.0)
Immature Granulocytes: 1 %
Lymphocytes Relative: 9 %
Lymphs Abs: 0.7 10*3/uL (ref 0.7–4.0)
MCH: 28.9 pg (ref 26.0–34.0)
MCHC: 32.2 g/dL (ref 30.0–36.0)
MCV: 89.9 fL (ref 80.0–100.0)
Monocytes Absolute: 0.6 10*3/uL (ref 0.1–1.0)
Monocytes Relative: 8 %
Neutro Abs: 5.9 10*3/uL (ref 1.7–7.7)
Neutrophils Relative %: 82 %
Platelets: 125 10*3/uL — ABNORMAL LOW (ref 150–400)
RBC: 3.56 MIL/uL — ABNORMAL LOW (ref 4.22–5.81)
RDW: 14.2 % (ref 11.5–15.5)
WBC: 7.2 10*3/uL (ref 4.0–10.5)
nRBC: 0 % (ref 0.0–0.2)

## 2021-01-30 LAB — CBG MONITORING, ED
Glucose-Capillary: 90 mg/dL (ref 70–99)
Glucose-Capillary: 84 mg/dL (ref 70–99)
Glucose-Capillary: 118 mg/dL — ABNORMAL HIGH (ref 70–99)
Glucose-Capillary: 79 mg/dL (ref 70–99)
Glucose-Capillary: 113 mg/dL — ABNORMAL HIGH (ref 70–99)

## 2021-01-30 LAB — BASIC METABOLIC PANEL
Anion gap: 10 (ref 5–15)
BUN: 16 mg/dL (ref 8–23)
CO2: 21 mmol/L — ABNORMAL LOW (ref 22–32)
Calcium: 8.6 mg/dL — ABNORMAL LOW (ref 8.9–10.3)
Chloride: 105 mmol/L (ref 98–111)
Creatinine, Ser: 1.24 mg/dL (ref 0.61–1.24)
GFR, Estimated: 56 mL/min — ABNORMAL LOW (ref >60)
Glucose, Bld: 79 mg/dL (ref 70–99)
Potassium: 3.6 mmol/L (ref 3.5–5.1)
Sodium: 136 mmol/L (ref 135–145)

## 2021-01-30 MED ORDER — DIVALPROEX SODIUM 250 MG PO DR TAB
750.0000 mg | DELAYED_RELEASE_TABLET | Freq: Every day | ORAL | Status: DC
  Administered 2021-01-30: 09:00:00 750 mg via ORAL
  Filled 2021-01-30: qty 3

## 2021-01-30 MED ORDER — CILOSTAZOL 100 MG PO TABS
100.0000 mg | ORAL_TABLET | Freq: Two times a day (BID) | ORAL | Status: DC
  Administered 2021-01-30: 09:00:00 100 mg via ORAL
  Filled 2021-01-30: qty 1

## 2021-01-30 MED ORDER — STERILE WATER FOR INJECTION IJ SOLN
INTRAMUSCULAR | Status: AC
  Administered 2021-01-30: 04:00:00 1.2 mL
  Filled 2021-01-30: qty 10

## 2021-01-30 MED ORDER — LORAZEPAM 2 MG/ML IJ SOLN
INTRAMUSCULAR | Status: AC
  Administered 2021-01-30: 04:00:00 1 mg via INTRAVENOUS
  Filled 2021-01-30: qty 1

## 2021-01-30 MED ORDER — QUETIAPINE FUMARATE 25 MG PO TABS: 25.0000 mg | ORAL_TABLET | Freq: Every day | ORAL | Status: DC

## 2021-01-30 MED ORDER — INSULIN ASPART 100 UNIT/ML IJ SOLN: 36.0000 [IU] | Freq: Every day | INTRAMUSCULAR | Status: DC

## 2021-01-30 MED ORDER — INSULIN DETEMIR 100 UNIT/ML ~~LOC~~ SOLN
55.0000 [IU] | Freq: Every morning | SUBCUTANEOUS | Status: DC
  Administered 2021-01-30: 55 [IU] via SUBCUTANEOUS
  Filled 2021-01-30 (×2): qty 0.55

## 2021-01-30 MED ORDER — AMLODIPINE BESYLATE 5 MG PO TABS
2.5000 mg | ORAL_TABLET | Freq: Every day | ORAL | Status: DC
  Administered 2021-01-30: 09:00:00 2.5 mg via ORAL
  Filled 2021-01-30: qty 1

## 2021-01-30 MED ORDER — INSULIN DETEMIR 100 UNIT/ML ~~LOC~~ SOLN
36.0000 [IU] | Freq: Every day | SUBCUTANEOUS | Status: DC
  Filled 2021-01-30: qty 0.36

## 2021-01-30 MED ORDER — INSULIN ASPART 100 UNIT/ML IJ SOLN: 24.0000 [IU] | Freq: Every day | INTRAMUSCULAR | Status: DC

## 2021-01-30 MED ORDER — ATORVASTATIN CALCIUM 40 MG PO TABS
40.0000 mg | ORAL_TABLET | Freq: Every day | ORAL | Status: DC
  Administered 2021-01-30: 09:00:00 40 mg via ORAL
  Filled 2021-01-30: qty 1

## 2021-01-30 MED ORDER — INSULIN DETEMIR 100 UNIT/ML ~~LOC~~ SOLN: 36.0000 [IU] | Freq: Every day | SUBCUTANEOUS | Status: DC

## 2021-01-30 MED ORDER — CARBIDOPA-LEVODOPA 25-100 MG PO TABS
1.0000 | ORAL_TABLET | Freq: Three times a day (TID) | ORAL | Status: DC
  Administered 2021-01-30 (×2): 1 via ORAL
  Filled 2021-01-30 (×2): qty 1

## 2021-01-30 MED ORDER — ZIPRASIDONE MESYLATE 20 MG IM SOLR
INTRAMUSCULAR | Status: AC
  Administered 2021-01-30: 04:00:00 10 mg via INTRAMUSCULAR
  Filled 2021-01-30: qty 20

## 2021-01-30 MED ORDER — PANTOPRAZOLE SODIUM 40 MG PO TBEC
40.0000 mg | DELAYED_RELEASE_TABLET | Freq: Every day | ORAL | Status: DC
  Administered 2021-01-30: 40 mg via ORAL
  Filled 2021-01-30: qty 1

## 2021-01-30 MED ORDER — GABAPENTIN 100 MG PO CAPS
100.0000 mg | ORAL_CAPSULE | Freq: Three times a day (TID) | ORAL | Status: DC
  Administered 2021-01-30 (×2): 100 mg via ORAL
  Filled 2021-01-30 (×2): qty 1

## 2021-01-30 MED ORDER — MEMANTINE HCL 10 MG PO TABS
5.0000 mg | ORAL_TABLET | Freq: Two times a day (BID) | ORAL | Status: DC
  Administered 2021-01-30: 5 mg via ORAL
  Filled 2021-01-30: qty 1

## 2021-01-30 MED ORDER — ASPIRIN EC 81 MG PO TBEC
81.0000 mg | DELAYED_RELEASE_TABLET | Freq: Every day | ORAL | Status: DC
  Administered 2021-01-30: 09:00:00 81 mg via ORAL
  Filled 2021-01-30: qty 1

## 2021-01-30 MED ORDER — DONEPEZIL HCL 5 MG PO TABS: 10.0000 mg | ORAL_TABLET | Freq: Every day | ORAL | Status: DC

## 2021-01-30 MED ORDER — LISINOPRIL 10 MG PO TABS
40.0000 mg | ORAL_TABLET | Freq: Every day | ORAL | Status: DC
  Administered 2021-01-30: 09:00:00 40 mg via ORAL
  Filled 2021-01-30: qty 4

## 2021-01-30 MED ORDER — OMEGA-3-ACID ETHYL ESTERS 1 G PO CAPS
1.0000 g | ORAL_CAPSULE | Freq: Two times a day (BID) | ORAL | Status: DC
  Administered 2021-01-30: 09:00:00 1 g via ORAL
  Filled 2021-01-30: qty 1

## 2021-01-30 MED ORDER — INSULIN ASPART 100 UNIT/ML ~~LOC~~ SOLN
24.0000 [IU] | SUBCUTANEOUS | Status: DC
Start: 2021-01-30 — End: 2021-01-30

## 2021-01-30 NOTE — TOC Initial Note (Addendum)
Transition of Care [facility]) - Initial/Assessment Note    Patient Details  Name: [name] MRN: [id number] Date of Birth: 06-25-1930  Transition of Care [facility]) CM/SW Contact:    Etna Cellar, RN Phone Number: 01/30/2021, 9:18 AM  Clinical Narrative:                 Spoke with Malachi Bonds, spouse regarding discharge plans. Patient is active with [facility] for PT. Has walker, wheelchair and lift chair. Wife assists with all ADL's. Has daughter and grandson who live beside her who also assist and another daughter who lives in [place] and comes over weekly to do lawncare and assist as needed. Wife is primary caregiver and reports being so tired but is not interested in placement. Patient requested that she place him in "nursing home" but she refuses. Wife manages all patients medication and has good understanding of insulin needs. Daughters assist with med mgmt as needed. Patient receives his medication from [place] however wife reports that he is not eligible for any aide services from the [place].   Expected Discharge Plan: Home w Home Health Services Barriers to Discharge: Continued Medical Work up   Patient Goals and CMS Choice Patient states their goals for this hospitalization and ongoing recovery are:: Get back home      Expected Discharge Plan and Services Expected Discharge Plan: Home w Home Health Services     Post Acute Care Choice: Home Health (Active wtih [facility]) Living arrangements for the past 2 months: Single Family Home                                      Prior Living Arrangements/Services Living arrangements for the past 2 months: Single Family Home Lives with:: Spouse Patient language and need for interpreter reviewed:: Yes Do you feel safe going back to the place where you live?: Yes      Need for Family Participation in Patient Care: Yes (Comment) Care giver support system in place?: Yes (comment) Current home services: DME (walker, wheelchair) Criminal  Activity/Legal Involvement Pertinent to Current Situation/Hospitalization: No - Comment as needed  Activities of Daily Living      Permission Sought/Granted Permission sought to share information with : Facility Industrial/product designer granted to share information with : Yes, Verbal Permission Granted  Share Information with NAME: [employer] [facility] [employer] [facility]           Emotional Assessment   Attitude/Demeanor/Rapport: Unable to Assess Affect (typically observed): Unable to Assess Orientation: : Oriented to Self, Oriented to Place Alcohol / Substance Use: Not Applicable Psych Involvement: No (comment)  Admission diagnosis:  Hypoglycemia Patient Active Problem List   Diagnosis Date Noted   Diarrhea 04/23/2018   Abdominal pain 04/23/2018   Atherosclerosis of artery of extremity with intermittent claudication (HCC) 03/03/2018   AC joint arthropathy    Eosinophilic colitis 95/28/4132   IBD (inflammatory bowel disease)    Mucosal abnormality of colon    Diverticulosis of colon with hemorrhage    Intractable diarrhea    Leukocytosis    Acute renal failure (HCC) 03/25/2015   Diabetes (HCC) 03/25/2015   Chronic diarrhea of unknown origin 03/25/2015   Rotator cuff tear 01/04/2013   S/P rotator cuff repair right  08/20/17 01/04/2013   Arthritis, shoulder region 10/15/2012   Biceps tendonitis on left 10/15/2012   Adhesive bursitis of left shoulder 09/01/2012   Partial tear of rotator cuff(726.13)  09/01/2012   Pain in joint, shoulder region 07/09/2011   Muscle weakness (generalized) 07/09/2011   Bursitis of shoulder, left 07/03/2011   PCP:  [name], FNP Pharmacy:   [facility] [phone] - [place], [place] - [address] AT [facility] OF [address] [place] [address] Phone: [phone] Fax: [phone]     Social Determinants of Health (SDOH) Interventions    Readmission Risk Interventions No flowsheet data found.

## 2021-01-30 NOTE — ED Provider Notes (Signed)
[facility] EMERGENCY DEPARTMENT Provider Note   CSN: [id number] Arrival date & time: 01/30/21  0048     History Chief Complaint  Patient presents with   Hypoglycemia    [name] is a 87 y.o. male.  Patient is an 87 year old male with past medical history of Parkinson's disease, dementia, diabetes, hypertension.  Patient brought by EMS for altered mental status.  According to the wife, just before dinner he became confused and not making any sense.  Wife called 911 and patient was found to have a blood sugar of 43.  He was given glucose in route and sugars have improved as has his mental status.  Patient has no complaints at present.  Wife does report decreased appetite recently.  The history is provided by the patient.  Hypoglycemia Initial blood sugar:  43 Blood sugar after intervention:  200 Severity:  Moderate Onset quality:  Sudden Progression:  Resolved Chronicity:  New Context: decreased oral intake   Relieved by:  Nothing Ineffective treatments:  None tried     Past Medical History:  Diagnosis Date   Arthritis    Dementia (HCC)    early stages   Diabetes mellitus    Eosinophilic colitis    GERD (gastroesophageal reflux disease)    HOH (hard of hearing)    Hypercholesterolemia    Hypertension     Patient Active Problem List   Diagnosis Date Noted   Diarrhea 04/23/2018   Abdominal pain 04/23/2018   Atherosclerosis of artery of extremity with intermittent claudication (HCC) 03/03/2018   AC joint arthropathy    Eosinophilic colitis 42/59/5638   IBD (inflammatory bowel disease)    Mucosal abnormality of colon    Diverticulosis of colon with hemorrhage    Intractable diarrhea    Leukocytosis    Acute renal failure (HCC) 03/25/2015   Diabetes (HCC) 03/25/2015   Chronic diarrhea of unknown origin 03/25/2015   Rotator cuff tear 01/04/2013   S/P rotator cuff repair right  08/20/17 01/04/2013   Arthritis, shoulder region 10/15/2012   Biceps tendonitis  on left 10/15/2012   Adhesive bursitis of left shoulder 09/01/2012   Partial tear of rotator cuff(726.13) 09/01/2012   Pain in joint, shoulder region 07/09/2011   Muscle weakness (generalized) 07/09/2011   Bursitis of shoulder, left 07/03/2011    Past Surgical History:  Procedure Laterality Date   CATARACT EXTRACTION W/PHACO Left 03/07/2014   Procedure: CATARACT EXTRACTION PHACO AND INTRAOCULAR LENS PLACEMENT LEFT EYE CDE=7.11;  Surgeon: Loraine Leriche T. Nile Riggs, MD;  Location: [facility];  Service: Ophthalmology;  Laterality: Left;   CATARACT EXTRACTION W/PHACO Right 03/14/2014   Procedure: CATARACT EXTRACTION PHACO AND INTRAOCULAR LENS PLACEMENT RIGHT EYE CDE=10.62;  Surgeon: Loraine Leriche T. Nile Riggs, MD;  Location: [facility];  Service: Ophthalmology;  Laterality: Right;   COLONOSCOPY  2009   Dr. Darrick Penna: pancolonic diverticulosis, internal hemorrhoids   COLONOSCOPY N/A 03/29/2015   RMR: inflammatory changes in the proximal rectum, colon and  terminal ileum suspicious for inflammatory bowel disease. ie. primarily Crohns colitis. Status post biopsy as described. concomitant NSAID exposure could excaerbate pre-existing inflammatory bowel disease but I do not think todays finding are primarly related to recent ibuprofen use.    KNEE ARTHROSCOPY Left    RESECTION DISTAL CLAVICAL Left 10/15/2012   Procedure: RESECTION DISTAL CLAVICAL;  Surgeon: Vickki Hearing, MD;  Location: [facility];  Service: Orthopedics;  Laterality: Left;   SHOULDER ACROMIOPLASTY Left 10/15/2012   Procedure: SHOULDER ACROMIOPLASTY;  Surgeon: Vickki Hearing, MD;  Location: [facility]  [facility];  Service: Orthopedics;  Laterality: Left;   SHOULDER ARTHROSCOPY WITH OPEN ROTATOR CUFF REPAIR Left 10/15/2012   Procedure: SHOULDER ARTHROSCOPY WITH OPEN ROTATOR CUFF REPAIR;  Surgeon: Vickki Hearing, MD;  Location: [facility];  Service: Orthopedics;  Laterality: Left;  open at 0912; with bicep tendon debridement;    SHOULDER OPEN ROTATOR CUFF REPAIR Right 08/20/2017    Procedure: ROTATOR CUFF REPAIR SHOULDER OPEN with removal of distal clavicle;  Surgeon: Vickki Hearing, MD;  Location: [facility];  Service: Orthopedics;  Laterality: Right;   TONSILLECTOMY         Family History  Problem Relation Age of Onset   Arthritis Other    Cancer Other    Diabetes Other    Cancer Mother    Colon cancer Neg Hx     Social History   Tobacco Use   Smoking status: Former    Packs/day: 3.00    Years: 30.00    Pack years: 90.00    Types: Cigarettes    Quit date: 10/11/1993    Years since quitting: 27.3   Smokeless tobacco: Former   Tobacco comments:    Quit approx 20 years ago  Building services engineer Use: Never used  Substance Use Topics   Alcohol use: No    Alcohol/week: 0.0 standard drinks   Drug use: No    Home Medications Prior to Admission medications   Medication Sig Start Date End Date Taking? Authorizing Provider  amLODipine (NORVASC) 2.5 MG tablet Take 2.5 mg by mouth daily.  12/11/17   [provider]  aspirin EC 81 MG tablet Take 81 mg by mouth daily.     [provider]  bismuth subsalicylate (PEPTO BISMOL) 262 MG chewable tablet Chew 262 mg by mouth as needed.    [provider]  cholecalciferol (VITAMIN D) 1000 units tablet Take 1,000 Units by mouth daily.    [provider]  cilostazol (PLETAL) 100 MG tablet TAKE 1 TABLET(100 MG) BY MOUTH TWICE DAILY BEFORE A MEAL 03/26/20   Maeola Harman, MD  [facility] Liver Oil 1000 MG CAPS Take 1 capsule by mouth daily.    [provider]  Cyanocobalamin (VITAMIN B-12) 1000 MCG/15ML LIQD Inject 1,000 mcg into the muscle every 30 (thirty) days. Once a month    [provider]  Cyanocobalamin (VITAMIN B12 PO) Take 1 tablet by mouth daily.    [provider]  donepezil (ARICEPT) 10 MG tablet Take 10 mg by mouth at bedtime.  07/16/17   [provider]  Fiber POWD Take by mouth. 1 tsp daily    [provider]  fish  oil-omega-3 fatty acids 1000 MG capsule Take 1 g by mouth 2 (two) times daily.     [provider]  fluticasone (FLONASE) 50 MCG/ACT nasal spray Place 2 sprays into both nostrils daily.    [provider]  gabapentin (NEURONTIN) 100 MG capsule Take 100 mg by mouth 3 (three) times daily.    [provider]  insulin aspart (NOVOLOG) 100 UNIT/ML injection Inject 24-36 Units into the skin See admin instructions. 24 units at lunch time, and 36 units at dinner    [provider]  insulin detemir (LEVEMIR) 100 UNIT/ML injection Inject 36-55 Units into the skin 2 (two) times daily. 55 units each morning, and 36 units at bedtime    [provider]  lisinopril (PRINIVIL,ZESTRIL) 40 MG tablet Take 40 mg by mouth daily.    [provider]  loperamide (IMODIUM A-D) 2 MG tablet Take 2 mg by mouth as needed for diarrhea or loose stools.    [provider]  loratadine (CLARITIN) 10 MG tablet Take 10 mg by mouth daily.    [provider]  meloxicam (MOBIC) 7.5 MG tablet TAKE 1 TABLET(7.5 MG) BY MOUTH DAILY 09/03/20   Vickki HearingHarrison, Stanley E, MD  memantine (NAMENDA) 5 MG tablet Take 5 mg by mouth 2 (two) times daily.    [provider]  Multiple Vitamin (MULTIVITAMIN WITH MINERALS) TABS tablet Take 1 tablet by mouth daily.    [provider]  niacin (SLO-NIACIN) 500 MG tablet Take 500 mg by mouth at bedtime.    [provider]  Omega-3 Fatty Acids (FISH OIL) 1000 MG CAPS Take 1 capsule by mouth daily.    [provider]  pantoprazole (PROTONIX) 40 MG tablet Take 40 mg by mouth daily.    [provider]  QUEtiapine (SEROQUEL) 50 MG tablet Take 50 mg by mouth at bedtime.    [provider]  Semaglutide (RYBELSUS) 3 MG TABS Take 1 tablet by mouth daily.    [provider]  traMADol (ULTRAM) 50 MG tablet Take 1 tablet (50 mg total) by mouth every 6 (six) hours as needed. 02/10/19   Vickki HearingHarrison,  Stanley E, MD    Allergies    Patient has no known allergies.  Review of Systems   Review of Systems  All other systems reviewed and are negative.  Physical Exam Updated Vital Signs BP 118/63   Pulse 92   Temp 97.6 F (36.4 C) (Oral)   Resp 19   SpO2 99%   Physical Exam Vitals and nursing note reviewed.  Constitutional:      General: He is not in acute distress.    Appearance: He is well-developed. He is not diaphoretic.     Comments: Patient is awake and alert and in no distress.  He is somewhat slow to respond and appears somewhat confused.  HENT:     Head: Normocephalic and atraumatic.  Cardiovascular:     Rate and Rhythm: Normal rate and regular rhythm.     Heart sounds: No murmur heard.   No friction rub.  Pulmonary:     Effort: Pulmonary effort is normal. No respiratory distress.     Breath sounds: Normal breath sounds. No wheezing or rales.  Abdominal:     General: Bowel sounds are normal. There is no distension.     Palpations: Abdomen is soft.     Tenderness: There is no abdominal tenderness.  Musculoskeletal:        General: Normal range of motion.     Cervical back: Normal range of motion and neck supple.  Skin:    General: Skin is warm and dry.  Neurological:     Mental Status: He is alert and oriented to person, place, and time.     Coordination: Coordination normal.    ED Results / Procedures / Treatments   Labs (all labs ordered are listed, but only abnormal results are displayed) Labs Reviewed  BASIC METABOLIC PANEL  CBC WITH DIFFERENTIAL/PLATELET  CBG MONITORING, ED  CBG MONITORING, ED    EKG None  Radiology No results found.  Procedures Procedures   Medications Ordered in ED Medications - No data to display  ED Course  I have reviewed the triage vital signs and the nursing notes.  Pertinent labs & imaging results that were available during my care of the  patient were reviewed by me and considered in my medical decision making  (see chart for details).    MDM Rules/Calculators/A&P  Patient is an 87 year old male with history of Parkinson's, dementia brought for altered mental status.  He was found to have blood sugar of 43 by EMS which improved after administration of dextrose.  Patient's laboratory studies are unremarkable and sugar is maintained while in the ED.  Patient is very unsteady on his feet and attempted to climb out of bed.  He became very angry and upset when his wife tried to stop him.  Patient required temporary restraint followed by Geodon.  He is now resting comfortably.  In discussion with his wife, he has become very agitated at home and she is having difficulty dealing with his outbursts.  She feels somewhat unsafe herself at home due to his alterations in personality related to his Parkinson's.  Wife is hoping patient could be admitted, but I see no medical reason for this.  I will consult transition of care in the morning who can look into his home situation.  It may also be beneficial for TTS to make medication recommendations.  It sounds as though Dr. Gerilyn Pilgrim has prescribed his Parkinson's meds in the past.  Final Clinical Impression(s) / ED Diagnoses Final diagnoses:  None    Rx / DC Orders ED Discharge Orders     None        Geoffery Lyons, MD 01/30/21 [phone]

## 2021-01-30 NOTE — ED Notes (Signed)
Ativan 1 mg IV adm per verbal order repeated back  from Geoffery Lyons, MD.

## 2021-01-30 NOTE — ED Provider Notes (Addendum)
I reordered the patient's home medication including insulin.  Per his bedside medications brought from home (insulin box), he takes 24 units with meals of novolog, and levemir 55 units in the AM, 36 units at bedtime.  I am holding his large morning Levemir and ordered only the overnight dose at this time given that the patient was hypoglycemic, and needs to show [place] that he is eating today.  If he does remain in the emergency department for longer period of time, and his BS has stabilized, EDP tomorrow may wish to add back morning levemir dose 55 units.   Terald Sleeper, MD 01/30/21 0932    Terald Sleeper, MD 01/30/21 [phone]

## 2021-01-30 NOTE — Discharge Instructions (Addendum)
[name] had an episode of low blood sugar overnight.  This can cause confusion and behavioral changes.  If your blood pressure drops low enough, can cause a coma or death.  I suspect this problem was because of using too much insulin without eating food.  If possible, would recommend that other family keep control of his insulin.  Make sure that he is eating if he is giving his short acting insulin (novolog) with meals.  Please call the [facility] hospital tomorrow to talk about these episodes.  They may have an alternative option to help manage his diabetes at home without requiring so many insulin shots.  Unfortunately [name] also has dementia.  This can also cause aggression and behavioral changes.  We talked to our social worker about placing him in a nursing home or facility, but his family told us they were not ready to do this.  Again, it is important that you discuss this option with his doctor at the [facility] hospital.  There should be a plan in place if [name] reaches a point where you cannot care for him.

## 2021-01-30 NOTE — ED Notes (Signed)
Pt discharged with wife.

## 2021-01-30 NOTE — ED Provider Notes (Signed)
I spoke on the phone with the patient's wife.  Her social worker is also spoken to her.  She reports that they absolutely do not want the patient placed in any kind of SNF or nursing facility, and that if that is the only option she would instead bring him home.  She does manage all of his medications except his insulin, she says he is stubborn about giving himself that.  I did explain the dangers of him give himself insulin, particularly if not eating enough.  She verbalized understanding.  I strongly encouraged her to follow-up with his doctor at the [facility] hospital.  There is additional family support in the area with grandchildren, and advised that they come help out as much as possible.  The family will come pick him up at 5 PM   Terald Sleeper, MD 01/30/21 [phone]

## 2021-01-30 NOTE — ED Notes (Signed)
Geodon 10 mg IM adm per verbal order, read back, from Geoffery Lyons, MD

## 2021-02-21 ENCOUNTER — Other Ambulatory Visit: Payer: Self-pay

## 2021-02-21 ENCOUNTER — Emergency Department (HOSPITAL_COMMUNITY): Payer: Medicare Other

## 2021-02-21 ENCOUNTER — Inpatient Hospital Stay (HOSPITAL_COMMUNITY)
Admission: EM | Admit: 2021-02-21 | Discharge: 2021-02-27 | DRG: 689 | Disposition: A | Discharge status: Skilled Nursing Facility | Payer: Medicare Other | Attending: Family Medicine | Admitting: Family Medicine

## 2021-02-21 DIAGNOSIS — G9341 Metabolic encephalopathy: Secondary | ICD-10-CM | POA: Diagnosis present

## 2021-02-21 DIAGNOSIS — Z791 Long term (current) use of non-steroidal anti-inflammatories (NSAID): Secondary | ICD-10-CM

## 2021-02-21 DIAGNOSIS — E1122 Type 2 diabetes mellitus with diabetic chronic kidney disease: Secondary | ICD-10-CM | POA: Diagnosis present

## 2021-02-21 DIAGNOSIS — Z79899 Other long term (current) drug therapy: Secondary | ICD-10-CM

## 2021-02-21 DIAGNOSIS — L89322 Pressure ulcer of left buttock, stage 2: Secondary | ICD-10-CM | POA: Diagnosis present

## 2021-02-21 DIAGNOSIS — E440 Moderate protein-calorie malnutrition: Secondary | ICD-10-CM | POA: Diagnosis present

## 2021-02-21 DIAGNOSIS — N179 Acute kidney failure, unspecified: Secondary | ICD-10-CM | POA: Diagnosis present

## 2021-02-21 DIAGNOSIS — F0281 Dementia in other diseases classified elsewhere with behavioral disturbance: Secondary | ICD-10-CM | POA: Diagnosis present

## 2021-02-21 DIAGNOSIS — G9340 Encephalopathy, unspecified: Secondary | ICD-10-CM | POA: Diagnosis present

## 2021-02-21 DIAGNOSIS — N1831 Chronic kidney disease, stage 3a: Secondary | ICD-10-CM | POA: Diagnosis present

## 2021-02-21 DIAGNOSIS — Z87891 Personal history of nicotine dependence: Secondary | ICD-10-CM

## 2021-02-21 DIAGNOSIS — E7800 Pure hypercholesterolemia, unspecified: Secondary | ICD-10-CM | POA: Diagnosis present

## 2021-02-21 DIAGNOSIS — Z20822 Contact with and (suspected) exposure to covid-19: Secondary | ICD-10-CM | POA: Diagnosis present

## 2021-02-21 DIAGNOSIS — F02818 Dementia in other diseases classified elsewhere, unspecified severity, with other behavioral disturbance: Secondary | ICD-10-CM

## 2021-02-21 DIAGNOSIS — Z794 Long term (current) use of insulin: Secondary | ICD-10-CM

## 2021-02-21 DIAGNOSIS — E119 Type 2 diabetes mellitus without complications: Secondary | ICD-10-CM

## 2021-02-21 DIAGNOSIS — R627 Adult failure to thrive: Secondary | ICD-10-CM | POA: Diagnosis not present

## 2021-02-21 DIAGNOSIS — H9190 Unspecified hearing loss, unspecified ear: Secondary | ICD-10-CM | POA: Diagnosis present

## 2021-02-21 DIAGNOSIS — R4182 Altered mental status, unspecified: Secondary | ICD-10-CM | POA: Diagnosis not present

## 2021-02-21 DIAGNOSIS — E11649 Type 2 diabetes mellitus with hypoglycemia without coma: Secondary | ICD-10-CM | POA: Diagnosis present

## 2021-02-21 DIAGNOSIS — Z833 Family history of diabetes mellitus: Secondary | ICD-10-CM

## 2021-02-21 DIAGNOSIS — N289 Disorder of kidney and ureter, unspecified: Secondary | ICD-10-CM

## 2021-02-21 DIAGNOSIS — L89312 Pressure ulcer of right buttock, stage 2: Secondary | ICD-10-CM | POA: Diagnosis present

## 2021-02-21 DIAGNOSIS — R54 Age-related physical debility: Secondary | ICD-10-CM | POA: Diagnosis present

## 2021-02-21 DIAGNOSIS — N183 Chronic kidney disease, stage 3 unspecified: Secondary | ICD-10-CM | POA: Diagnosis present

## 2021-02-21 DIAGNOSIS — Z7982 Long term (current) use of aspirin: Secondary | ICD-10-CM

## 2021-02-21 DIAGNOSIS — Z809 Family history of malignant neoplasm, unspecified: Secondary | ICD-10-CM

## 2021-02-21 DIAGNOSIS — R451 Restlessness and agitation: Secondary | ICD-10-CM

## 2021-02-21 DIAGNOSIS — I129 Hypertensive chronic kidney disease with stage 1 through stage 4 chronic kidney disease, or unspecified chronic kidney disease: Secondary | ICD-10-CM | POA: Diagnosis present

## 2021-02-21 DIAGNOSIS — N390 Urinary tract infection, site not specified: Principal | ICD-10-CM | POA: Diagnosis present

## 2021-02-21 DIAGNOSIS — L8990 Pressure ulcer of unspecified site, unspecified stage: Secondary | ICD-10-CM | POA: Insufficient documentation

## 2021-02-21 DIAGNOSIS — Z66 Do not resuscitate: Secondary | ICD-10-CM | POA: Diagnosis present

## 2021-02-21 DIAGNOSIS — K219 Gastro-esophageal reflux disease without esophagitis: Secondary | ICD-10-CM | POA: Diagnosis present

## 2021-02-21 DIAGNOSIS — E1151 Type 2 diabetes mellitus with diabetic peripheral angiopathy without gangrene: Secondary | ICD-10-CM | POA: Diagnosis present

## 2021-02-21 DIAGNOSIS — G20 Parkinson's disease: Secondary | ICD-10-CM | POA: Diagnosis not present

## 2021-02-21 DIAGNOSIS — Z6826 Body mass index (BMI) 26.0-26.9, adult: Secondary | ICD-10-CM

## 2021-02-21 DIAGNOSIS — I6203 Nontraumatic chronic subdural hemorrhage: Secondary | ICD-10-CM | POA: Diagnosis present

## 2021-02-21 LAB — CBC WITH DIFFERENTIAL/PLATELET
Abs Immature Granulocytes: 0.09 10*3/uL — ABNORMAL HIGH (ref 0.00–0.07)
Basophils Absolute: 0 10*3/uL (ref 0.0–0.1)
Basophils Relative: 0 %
Eosinophils Absolute: 0 10*3/uL (ref 0.0–0.5)
Eosinophils Relative: 0 %
HCT: 33.2 % — ABNORMAL LOW (ref 39.0–52.0)
Hemoglobin: 10.7 g/dL — ABNORMAL LOW (ref 13.0–17.0)
Immature Granulocytes: 2 %
Lymphocytes Relative: 36 %
Lymphs Abs: 1.9 10*3/uL (ref 0.7–4.0)
MCH: 28.8 pg (ref 26.0–34.0)
MCHC: 32.2 g/dL (ref 30.0–36.0)
MCV: 89.2 fL (ref 80.0–100.0)
Monocytes Absolute: 0.6 10*3/uL (ref 0.1–1.0)
Monocytes Relative: 12 %
Neutro Abs: 2.6 10*3/uL (ref 1.7–7.7)
Neutrophils Relative %: 50 %
Platelets: 114 10*3/uL — ABNORMAL LOW (ref 150–400)
RBC: 3.72 MIL/uL — ABNORMAL LOW (ref 4.22–5.81)
RDW: 14.6 % (ref 11.5–15.5)
WBC: 5.2 10*3/uL (ref 4.0–10.5)
nRBC: 0 % (ref 0.0–0.2)

## 2021-02-21 LAB — URINALYSIS, ROUTINE W REFLEX MICROSCOPIC
Bacteria, UA: NONE SEEN
Bilirubin Urine: NEGATIVE
Glucose, UA: NEGATIVE mg/dL
Hgb urine dipstick: NEGATIVE
Ketones, ur: 5 mg/dL — AB
Nitrite: NEGATIVE
Protein, ur: NEGATIVE mg/dL
Specific Gravity, Urine: 1.014 (ref 1.005–1.030)
WBC, UA: >50 WBC/hpf — ABNORMAL HIGH (ref 0–5)
pH: 6 (ref 5.0–8.0)

## 2021-02-21 LAB — RESP PANEL BY RT-PCR (FLU A&B, COVID) ARPGX2
Influenza A by PCR: NEGATIVE
Influenza B by PCR: NEGATIVE
SARS Coronavirus 2 by RT PCR: NEGATIVE

## 2021-02-21 LAB — COMPREHENSIVE METABOLIC PANEL
ALT: 8 U/L (ref 0–44)
AST: 15 U/L (ref 15–41)
Albumin: 3.6 g/dL (ref 3.5–5.0)
Alkaline Phosphatase: 68 U/L (ref 38–126)
Anion gap: 8 (ref 5–15)
BUN: 20 mg/dL (ref 8–23)
CO2: 24 mmol/L (ref 22–32)
Calcium: 8.8 mg/dL — ABNORMAL LOW (ref 8.9–10.3)
Chloride: 105 mmol/L (ref 98–111)
Creatinine, Ser: 1.48 mg/dL — ABNORMAL HIGH (ref 0.61–1.24)
GFR, Estimated: 45 mL/min — ABNORMAL LOW (ref >60)
Glucose, Bld: 167 mg/dL — ABNORMAL HIGH (ref 70–99)
Potassium: 4.3 mmol/L (ref 3.5–5.1)
Sodium: 137 mmol/L (ref 135–145)
Total Bilirubin: 0.6 mg/dL (ref 0.3–1.2)
Total Protein: 7 g/dL (ref 6.5–8.1)

## 2021-02-21 LAB — GLUCOSE, CAPILLARY: Glucose-Capillary: 125 mg/dL — ABNORMAL HIGH (ref 70–99)

## 2021-02-21 LAB — ETHANOL: Alcohol, Ethyl (B): <10 mg/dL (ref <10)

## 2021-02-21 LAB — TSH: TSH: 2.786 u[IU]/mL (ref 0.350–4.500)

## 2021-02-21 LAB — AMMONIA: Ammonia: 33 umol/L (ref 9–35)

## 2021-02-21 MED ORDER — CILOSTAZOL 100 MG PO TABS
100.0000 mg | ORAL_TABLET | Freq: Every day | ORAL | Status: DC
  Administered 2021-02-22 – 2021-02-26 (×5): 100 mg via ORAL
  Filled 2021-02-21 (×6): qty 1

## 2021-02-21 MED ORDER — HEPARIN SODIUM (PORCINE) 5000 UNIT/ML IJ SOLN
5000.0000 [IU] | Freq: Three times a day (TID) | INTRAMUSCULAR | Status: DC
  Administered 2021-02-21 – 2021-02-27 (×16): 5000 [IU] via SUBCUTANEOUS
  Filled 2021-02-21 (×16): qty 1

## 2021-02-21 MED ORDER — SODIUM CHLORIDE 0.9 % IV SOLN
1.0000 g | INTRAVENOUS | Status: DC
  Administered 2021-02-21 – 2021-02-24 (×4): 1 g via INTRAVENOUS
  Filled 2021-02-21 (×4): qty 10

## 2021-02-21 MED ORDER — DONEPEZIL HCL 5 MG PO TABS
10.0000 mg | ORAL_TABLET | Freq: Every day | ORAL | Status: DC
  Administered 2021-02-22 – 2021-02-23 (×2): 10 mg via ORAL
  Filled 2021-02-21 (×2): qty 2

## 2021-02-21 MED ORDER — AMLODIPINE BESYLATE 5 MG PO TABS
2.5000 mg | ORAL_TABLET | Freq: Every morning | ORAL | Status: DC
  Administered 2021-02-22 – 2021-02-23 (×2): 2.5 mg via ORAL
  Filled 2021-02-21 (×2): qty 1

## 2021-02-21 MED ORDER — ASPIRIN EC 81 MG PO TBEC
81.0000 mg | DELAYED_RELEASE_TABLET | Freq: Every day | ORAL | Status: DC
  Administered 2021-02-22 – 2021-02-26 (×5): 81 mg via ORAL
  Filled 2021-02-21 (×6): qty 1

## 2021-02-21 MED ORDER — PANTOPRAZOLE SODIUM 40 MG PO TBEC
40.0000 mg | DELAYED_RELEASE_TABLET | Freq: Every day | ORAL | Status: DC
  Administered 2021-02-22 – 2021-02-24 (×3): 40 mg via ORAL
  Filled 2021-02-21 (×4): qty 1

## 2021-02-21 MED ORDER — MEMANTINE HCL 10 MG PO TABS
5.0000 mg | ORAL_TABLET | Freq: Two times a day (BID) | ORAL | Status: DC
  Administered 2021-02-22 – 2021-02-24 (×5): 5 mg via ORAL
  Filled 2021-02-21 (×5): qty 1

## 2021-02-21 MED ORDER — LISINOPRIL 10 MG PO TABS
20.0000 mg | ORAL_TABLET | Freq: Every day | ORAL | Status: DC
  Administered 2021-02-21 – 2021-02-23 (×3): 20 mg via ORAL
  Filled 2021-02-21 (×4): qty 2

## 2021-02-21 MED ORDER — CARBIDOPA-LEVODOPA 25-100 MG PO TABS
1.0000 | ORAL_TABLET | Freq: Three times a day (TID) | ORAL | Status: DC
  Administered 2021-02-21 – 2021-02-26 (×16): 1 via ORAL
  Filled 2021-02-21 (×17): qty 1

## 2021-02-21 MED ORDER — VITAMIN B-12 100 MCG PO TABS
100.0000 ug | ORAL_TABLET | Freq: Every morning | ORAL | Status: DC
  Administered 2021-02-22 – 2021-02-26 (×5): 100 ug via ORAL
  Filled 2021-02-21 (×5): qty 1

## 2021-02-21 MED ORDER — INSULIN ASPART 100 UNIT/ML IJ SOLN: 0.0000 [IU] | Freq: Every day | INTRAMUSCULAR | Status: DC

## 2021-02-21 MED ORDER — DIVALPROEX SODIUM 250 MG PO DR TAB
750.0000 mg | DELAYED_RELEASE_TABLET | Freq: Every day | ORAL | Status: DC
  Administered 2021-02-22 – 2021-02-23 (×2): 750 mg via ORAL
  Filled 2021-02-21 (×3): qty 3

## 2021-02-21 MED ORDER — ATORVASTATIN CALCIUM 40 MG PO TABS
40.0000 mg | ORAL_TABLET | Freq: Every day | ORAL | Status: DC
  Administered 2021-02-22 – 2021-02-24 (×3): 40 mg via ORAL
  Filled 2021-02-21 (×3): qty 1

## 2021-02-21 MED ORDER — GABAPENTIN 100 MG PO CAPS
100.0000 mg | ORAL_CAPSULE | Freq: Three times a day (TID) | ORAL | Status: DC
  Administered 2021-02-21 – 2021-02-24 (×9): 100 mg via ORAL
  Filled 2021-02-21 (×11): qty 1

## 2021-02-21 MED ORDER — INSULIN ASPART 100 UNIT/ML IJ SOLN
0.0000 [IU] | Freq: Three times a day (TID) | INTRAMUSCULAR | Status: DC
  Administered 2021-02-22: 1 [IU] via SUBCUTANEOUS
  Administered 2021-02-24: 2 [IU] via SUBCUTANEOUS
  Administered 2021-02-24 – 2021-02-25 (×2): 1 [IU] via SUBCUTANEOUS
  Administered 2021-02-25 (×2): 2 [IU] via SUBCUTANEOUS
  Administered 2021-02-26: 1 [IU] via SUBCUTANEOUS

## 2021-02-21 MED ORDER — SODIUM CHLORIDE 0.9 % IV BOLUS
500.0000 mL | Freq: Once | INTRAVENOUS | Status: AC
  Administered 2021-02-21: 500 mL via INTRAVENOUS

## 2021-02-21 NOTE — Progress Notes (Signed)
Pt arrived to room #314 via stretcher from ED. Pt moved from stretcher to bed with assist x4. Pt oriented to name and date of birth, tells me he is in [place], but unable to tell me where. Pt has unna boot wraps to bilateral feet and lower legs. IV site WNL, flushes well with no s/s infiltration. Pt so far is cooperative with staff, allowed staff to assess him, clean his face and ger VS. VSS.  Security guard remains at bedside due to pt's previous combative behavior while in the ED. A sitter has been secured for safety at 7pm. Bed alarm on for safety.

## 2021-02-21 NOTE — Hospital Discharge Follow-Up (Addendum)
[facility] H&P   Patient Demographics:    [name], is a 87 y.o. male  MRN: [id number]   DOB - 1930-01-31  Admit Date - 02/21/2021  Outpatient Primary MD for the patient is [name], FNP  Referring MD/NP/PA: Home    Patient coming from: Home  Chief Complaint  Patient presents with   Altered Mental Status      HPI:    [name]  is a 87 y.o. male, past medical history of diabetes mellitus, insulin-dependent, dementia, GERD, Parkinson disease, hypertension, patient was brought to ED by his wife secondary to ental status, she reports patient with worsening agitation, confusion, progressive over the past week, as well patient with episodes of hypoglycemia, and fall where EMS assessed him for couple times, but patient refused evaluation, patient had wounds to the left buttocks, and left foot, wife report usually patient using walker and wheelchair, but he is with increased falls recently. -In ED his work-up was significant for creatinine of 1.48, platelet of 114 K, hemoglobin of 10.7, urinalysis is positive with significant pyuria and positive leukocyte esterase, his CT head with stable less than 3 mm chronic bilateral subdural hematomas or dural thickening, with no acute hemorrhage.  [facility] hospital[facility] consulted to admit.    Review of systems:    We will to obtain appropriate review of system given has dementia   With Past History of the following :    Past Medical History:  Diagnosis Date   Arthritis    Dementia (HCC)    early stages   Diabetes mellitus    Eosinophilic colitis    GERD (gastroesophageal reflux disease)    HOH (hard of hearing)    Hypercholesterolemia    Hypertension       Past Surgical History:  Procedure Laterality Date   CATARACT EXTRACTION W/PHACO Left 03/07/2014   Procedure: CATARACT EXTRACTION PHACO AND INTRAOCULAR LENS PLACEMENT LEFT EYE  CDE=7.11;  Surgeon: Loraine Leriche T. Nile Riggs, MD;  Location: [facility];  Service: Ophthalmology;  Laterality: Left;   CATARACT EXTRACTION W/PHACO Right 03/14/2014   Procedure: CATARACT EXTRACTION PHACO AND INTRAOCULAR LENS PLACEMENT RIGHT EYE CDE=10.62;  Surgeon: Loraine Leriche T. Nile Riggs, MD;  Location: [facility];  Service: Ophthalmology;  Laterality: Right;   COLONOSCOPY  2009   Dr. Darrick Penna: pancolonic diverticulosis, internal hemorrhoids   COLONOSCOPY N/A 03/29/2015   RMR: inflammatory changes in the proximal rectum, colon and  terminal ileum suspicious for inflammatory bowel disease. ie. primarily Crohns colitis. Status post biopsy as described. concomitant NSAID exposure could excaerbate pre-existing inflammatory bowel disease but I do not think todays finding are primarly related to recent ibuprofen use.    KNEE ARTHROSCOPY Left    RESECTION DISTAL CLAVICAL Left 10/15/2012   Procedure: RESECTION DISTAL CLAVICAL;  Surgeon: Vickki Hearing, MD;  Location: [facility];  Service: Orthopedics;  Laterality: Left;   SHOULDER ACROMIOPLASTY Left 10/15/2012   Procedure:  SHOULDER ACROMIOPLASTY;  Surgeon: Vickki Hearing, MD;  Location: [facility];  Service: Orthopedics;  Laterality: Left;   SHOULDER ARTHROSCOPY WITH OPEN ROTATOR CUFF REPAIR Left 10/15/2012   Procedure: SHOULDER ARTHROSCOPY WITH OPEN ROTATOR CUFF REPAIR;  Surgeon: Vickki Hearing, MD;  Location: [facility];  Service: Orthopedics;  Laterality: Left;  open at 0912; with bicep tendon debridement;    SHOULDER OPEN ROTATOR CUFF REPAIR Right 08/20/2017   Procedure: ROTATOR CUFF REPAIR SHOULDER OPEN with removal of distal clavicle;  Surgeon: Vickki Hearing, MD;  Location: [facility];  Service: Orthopedics;  Laterality: Right;   TONSILLECTOMY        Social History:     Social History   Tobacco Use   Smoking status: Former    Packs/day: 3.00    Years: 30.00    Pack years: 90.00    Types: Cigarettes    Quit date: 10/11/1993    Years since quitting: 27.3   Smokeless  tobacco: Former   Tobacco comments:    Quit approx 20 years ago  Substance Use Topics   Alcohol use: No    Alcohol/week: 0.0 standard drinks       Family History :     Family History  Problem Relation Age of Onset   Arthritis Other    Cancer Other    Diabetes Other    Cancer Mother    Colon cancer Neg Hx      Home Medications:   Prior to Admission medications   Medication Sig Start Date End Date Taking? Authorizing Provider  amLODipine (NORVASC) 2.5 MG tablet Take 2.5 mg by mouth every morning. 12/11/17  Yes [provider]  aspirin EC 81 MG tablet Take 81 mg by mouth daily.    Yes [provider]  atorvastatin (LIPITOR) 40 MG tablet Take 40 mg by mouth at bedtime. 12/26/20  Yes [provider]  bismuth subsalicylate (PEPTO BISMOL) 262 MG chewable tablet Chew 262 mg by mouth daily as needed for diarrhea or loose stools.   Yes [provider]  carbidopa-levodopa (SINEMET IR) 25-100 MG tablet Take 1 tablet by mouth 3 (three) times daily. 12/06/20  Yes [provider]  cilostazol (PLETAL) 100 MG tablet TAKE 1 TABLET(100 MG) BY MOUTH TWICE DAILY BEFORE A MEAL Patient taking differently: Take 100 mg by mouth daily. 03/26/20  Yes Maeola Harman, MD  Cyanocobalamin (VITAMIN B12 PO) Take 1 tablet by mouth every morning.   Yes [provider]  divalproex (DEPAKOTE) 250 MG DR tablet Take 250 mg by mouth 3 (three) times daily. 01/04/21  Yes [provider]  donepezil (ARICEPT) 10 MG tablet Take 10 mg by mouth at bedtime.  07/16/17  Yes [provider]  fish oil-omega-3 fatty acids 1000 MG capsule Take 1 g by mouth 2 (two) times daily.    Yes [provider]  fluticasone (FLONASE) 50 MCG/ACT nasal spray Place 2 sprays into both nostrils daily as needed for allergies.   Yes [provider]  insulin aspart (NOVOLOG) 100 UNIT/ML injection Inject 24-36 Units into the skin See admin instructions. 24  units at lunch time, and 36 units at dinner *Do not take if levels are under 150   Yes [provider]  insulin detemir (LEVEMIR) 100 UNIT/ML injection Inject 36-55 Units into the skin 2 (two) times daily. 55 units each morning, and 36 units at bedtime   Yes [provider]  lisinopril (PRINIVIL,ZESTRIL) 40 MG tablet Take 20 mg by mouth  daily.   Yes [provider]  loperamide (IMODIUM A-D) 2 MG tablet Take 2 mg by mouth as needed for diarrhea or loose stools.   Yes [provider]  loratadine (CLARITIN) 10 MG tablet Take 10 mg by mouth daily.   Yes [provider]  meloxicam (MOBIC) 7.5 MG tablet TAKE 1 TABLET(7.5 MG) BY MOUTH DAILY Patient taking differently: Take 7.5 mg by mouth every morning. 09/03/20  Yes Vickki HearingHarrison, Stanley E, MD  memantine (NAMENDA) 5 MG tablet Take 5 mg by mouth 2 (two) times daily.   Yes [provider]  Multiple Vitamin (MULTIVITAMIN WITH MINERALS) TABS tablet Take 1 tablet by mouth every morning.   Yes [provider]  niacin (SLO-NIACIN) 500 MG tablet Take 500 mg by mouth at bedtime.   Yes [provider]  Omega-3 Fatty Acids (FISH OIL) 1000 MG CAPS Take 1 capsule by mouth daily.   Yes [provider]  pantoprazole (PROTONIX) 40 MG tablet Take 40 mg by mouth daily.   Yes [provider]  QUEtiapine (SEROQUEL) 25 MG tablet Take 25 mg by mouth at bedtime. 12/05/20  Yes [provider]  Semaglutide 3 MG TABS Take 3 mg by mouth every morning.   Yes [provider]     Allergies:    No Known Allergies   Physical Exam:   Vitals  Blood pressure 129/62, pulse 88, temperature 98.2 F (36.8 C), resp. rate 17, height 5\' 10"  (1.778 m), weight 83 kg, SpO2 99 %.   1. General Trembly frail deconditioned male, laying in bed, no apparent distress  2.  Patient is demented, awake, alert to name only , significantly impaired cognition and insight .  3. No F.N deficits, ALL  C.Nerves Intact, fingers all extremities with no gross deficits  4. Ears and Eyes appear Normal, Conjunctivae clear, PERRLA.dry  Oral Mucosa.  5. Supple Neck, No JVD, No cervical lymphadenopathy appriciated, No Carotid Bruits.  6. Symmetrical Chest wall movement, Good air movement bilaterally, CTAB.  7. RRR, No Gallops, Rubs or Murmurs, No Parasternal Heave.  8. Positive Bowel Sounds, Abdomen Soft, No tenderness, No organomegaly appriciated,No rebound -guarding or rigidity.  9.  No Cyanosis, Normal Skin Turgor, No Skin Rash or Bruise.  10.  joints appear normal , no effusions, Normal ROM.  11. No Palpable Lymph Nodes in Neck or Axillae   Data Review:    CBC Recent Labs  Lab 02/21/21 1335  WBC 5.2  HGB 10.7*  HCT 33.2*  PLT 114*  MCV 89.2  MCH 28.8  MCHC 32.2  RDW 14.6  LYMPHSABS 1.9  MONOABS 0.6  EOSABS 0.0  BASOSABS 0.0   ------------------------------------------------------------------------------------------------------------------  Chemistries  Recent Labs  Lab 02/21/21 1335  NA 137  K 4.3  CL 105  CO2 24  GLUCOSE 167*  BUN 20  CREATININE 1.48*  CALCIUM 8.8*  AST 15  ALT 8  ALKPHOS 68  BILITOT 0.6   ------------------------------------------------------------------------------------------------------------------ estimated creatinine clearance is 34.9 mL/min (A) (by C-G formula based on SCr of 1.48 mg/dL (H)). ------------------------------------------------------------------------------------------------------------------ Recent Labs    02/21/21 1356  TSH 2.786    Coagulation profile No results for input(s): INR, PROTIME in the last 168 hours. ------------------------------------------------------------------------------------------------------------------- No results for input(s): DDIMER in the last 72 hours. -------------------------------------------------------------------------------------------------------------------  Cardiac  Enzymes No results for input(s): CKMB, TROPONINI, MYOGLOBIN in the last 168 hours.  Invalid input(s): CK ------------------------------------------------------------------------------------------------------------------ No results found for: BNP   ---------------------------------------------------------------------------------------------------------------  Urinalysis    Component Value Date/Time  COLORURINE YELLOW 02/21/2021 1355   APPEARANCEUR CLEAR 02/21/2021 1355   LABSPEC 1.014 02/21/2021 1355   PHURINE 6.0 02/21/2021 1355   GLUCOSEU NEGATIVE 02/21/2021 1355   HGBUR NEGATIVE 02/21/2021 1355   BILIRUBINUR NEGATIVE 02/21/2021 1355   KETONESUR 5 (A) 02/21/2021 1355   PROTEINUR NEGATIVE 02/21/2021 1355   UROBILINOGEN 0.2 03/26/2015 1325   NITRITE NEGATIVE 02/21/2021 1355   LEUKOCYTESUR LARGE (A) 02/21/2021 1355    ----------------------------------------------------------------------------------------------------------------   Imaging Results:    CT Head Wo Contrast  Result Date: 02/21/2021 CLINICAL DATA:  Dementia, Parkinson's disease, hypoglycemia, altered level of consciousness EXAM: CT HEAD WITHOUT CONTRAST TECHNIQUE: Contiguous axial images were obtained from the base of the skull through the vertex without intravenous contrast. COMPARISON:  06/05/2020 FINDINGS: Brain: No acute infarct or hemorrhage. Chronic small vessel ischemic changes are seen within the periventricular white matter. Lateral ventricles and midline structures are unremarkable. Stable bilateral less than 3 mm chronic subdural hematomas are again noted along the convexities. No acute extra-axial fluid collections. No mass effect. Vascular: No hyperdense vessel or unexpected calcification. Skull: Normal. Negative for fracture or focal lesion. Sinuses/Orbits: Polypoid mucosal thickening left sphenoid sinus. Remaining sinuses are clear. Chronic left mastoid effusion. Other: None. IMPRESSION: 1. Stable less than  3 mm chronic bilateral subdural hematomas or dural thickening. No acute hemorrhage. 2. Chronic small vessel ischemic changes.  No acute infarct. Electronically Signed   By: Sharlet Salina M.D.   On: 02/21/2021 15:56   DG Chest Portable 1 View  Result Date: 02/21/2021 CLINICAL DATA:  Altered. EXAM: PORTABLE CHEST 1 VIEW COMPARISON:  Radiograph 12/31/2020 FINDINGS: Low lung volumes. Stable heart size and mediastinal contours for technique. Aortic atherosclerosis. Streaky retrocardiac atelectasis. No confluent airspace disease. No pulmonary edema. No pleural effusion or pneumothorax. No acute osseous abnormalities are seen. IMPRESSION: Low lung volumes with retrocardiac atelectasis. Electronically Signed   By: Narda Rutherford M.D.   On: 02/21/2021 17:15    My personal review of EKG:   Vent. rate 99 BPM PR interval 177 ms QRS duration 132 ms QT/QTcB 378/486 ms P-R-T axes 32 -74 41 Sinus rhythm RBBB and LAFB    Assessment & Plan:    Active Problems:   Acute renal failure (HCC)   Diabetes (HCC)   Failure to thrive in adult   Dementia due to Parkinson's disease with behavioral disturbance (HCC)   Parkinson disease (HCC)    Failure to thrive -Patient brought by his wife due to to worsening mentation, less interactive, poor oral intake. -His baseline most likely worsened due to UTI, but it does appear he is with poor baseline given his dementia and Parkinson's disease -Consult PT/OT, likely will need subacute rehab as discussed with wife -Aute metabolic encephalopathy due to UTI, ET head with no acute findings (old name versus subdural hematoma), ammonia WNL.   UTI -Patient altered, cannot provide any complaints, will send urine culture and start on Rocephin  Parkinson disease -We will continue with home medications include Sinemet  Dementia -Continue with home medication include Depakote, Aricept and Namenda -We will hold Seroquel given altered mental status  Diabetes  mellitus -Resume Lantus at a lower dose given oral intake is unpredictable, will add insulin sliding scale, will hold semaglutide  Hypertension  - continue with home medications  Hyperlipidemia - continue with statin  GERD - on PPI  CKD stage III  -At baseline  PVD  -continue with cilostazol and statin  CT head showing stable less than 3 mm chronic bilateral subdural  hematomas or dural thickening. No acute hemorrhage.  DVT Prophylaxis Heparin  AM Labs Ordered, also please review Full Orders  Family Communication: Admission, patients condition and plan of care including tests being ordered have been discussed with wife at bedside who indicate understanding and agree with the plan and Code Status.  Code Status DNR confirmed by wife  Likely DC to likely will need SNF placement  Condition GUARDED    Consults called: None  Admission status: Observation  Time spent in minutes : 60 minutes   Huey Bienenstock M.D on 02/21/2021 at 8:39 PM   [facility] - Office  [phone]

## 2021-02-21 NOTE — ED Provider Notes (Signed)
[facility] EMERGENCY DEPARTMENT Provider Note   CSN: [id number] Arrival date & time: 02/21/21  1321     History Chief Complaint  Patient presents with   Altered Mental Status    [name] is a 87 y.o. male.  Patient with history of diabetes, dementia, reflux, high blood pressure presents with gradually worsening agitation and confusion over the past week.  Wife reports history of Parkinson's and dementia.  Wife admits she is not able to take care of him anymore at home.  Patient had episode of hypoglycemia and a fall that EMS went out but at that time refused evaluation.  Patient's had wounds to left buttocks and left foot.  Unknown details of fall.      Past Medical History:  Diagnosis Date   Arthritis    Dementia (HCC)    early stages   Diabetes mellitus    Eosinophilic colitis    GERD (gastroesophageal reflux disease)    HOH (hard of hearing)    Hypercholesterolemia    Hypertension     Patient Active Problem List   Diagnosis Date Noted   Diarrhea 04/23/2018   Abdominal pain 04/23/2018   Atherosclerosis of artery of extremity with intermittent claudication (HCC) 03/03/2018   AC joint arthropathy    Eosinophilic colitis 20/35/5974   IBD (inflammatory bowel disease)    Mucosal abnormality of colon    Diverticulosis of colon with hemorrhage    Intractable diarrhea    Leukocytosis    Acute renal failure (HCC) 03/25/2015   Diabetes (HCC) 03/25/2015   Chronic diarrhea of unknown origin 03/25/2015   Rotator cuff tear 01/04/2013   S/P rotator cuff repair right  08/20/17 01/04/2013   Arthritis, shoulder region 10/15/2012   Biceps tendonitis on left 10/15/2012   Adhesive bursitis of left shoulder 09/01/2012   Partial tear of rotator cuff(726.13) 09/01/2012   Pain in joint, shoulder region 07/09/2011   Muscle weakness (generalized) 07/09/2011   Bursitis of shoulder, left 07/03/2011    Past Surgical History:  Procedure Laterality Date   CATARACT EXTRACTION  W/PHACO Left 03/07/2014   Procedure: CATARACT EXTRACTION PHACO AND INTRAOCULAR LENS PLACEMENT LEFT EYE CDE=7.11;  Surgeon: Loraine Leriche T. Nile Riggs, MD;  Location: [facility];  Service: Ophthalmology;  Laterality: Left;   CATARACT EXTRACTION W/PHACO Right 03/14/2014   Procedure: CATARACT EXTRACTION PHACO AND INTRAOCULAR LENS PLACEMENT RIGHT EYE CDE=10.62;  Surgeon: Loraine Leriche T. Nile Riggs, MD;  Location: [facility];  Service: Ophthalmology;  Laterality: Right;   COLONOSCOPY  2009   Dr. Darrick Penna: pancolonic diverticulosis, internal hemorrhoids   COLONOSCOPY N/A 03/29/2015   RMR: inflammatory changes in the proximal rectum, colon and  terminal ileum suspicious for inflammatory bowel disease. ie. primarily Crohns colitis. Status post biopsy as described. concomitant NSAID exposure could excaerbate pre-existing inflammatory bowel disease but I do not think todays finding are primarly related to recent ibuprofen use.    KNEE ARTHROSCOPY Left    RESECTION DISTAL CLAVICAL Left 10/15/2012   Procedure: RESECTION DISTAL CLAVICAL;  Surgeon: Vickki Hearing, MD;  Location: [facility];  Service: Orthopedics;  Laterality: Left;   SHOULDER ACROMIOPLASTY Left 10/15/2012   Procedure: SHOULDER ACROMIOPLASTY;  Surgeon: Vickki Hearing, MD;  Location: [facility];  Service: Orthopedics;  Laterality: Left;   SHOULDER ARTHROSCOPY WITH OPEN ROTATOR CUFF REPAIR Left 10/15/2012   Procedure: SHOULDER ARTHROSCOPY WITH OPEN ROTATOR CUFF REPAIR;  Surgeon: Vickki Hearing, MD;  Location: [facility];  Service: Orthopedics;  Laterality: Left;  open at 0912; with bicep tendon debridement;  SHOULDER OPEN ROTATOR CUFF REPAIR Right 08/20/2017   Procedure: ROTATOR CUFF REPAIR SHOULDER OPEN with removal of distal clavicle;  Surgeon: Vickki Hearing, MD;  Location: [facility];  Service: Orthopedics;  Laterality: Right;   TONSILLECTOMY         Family History  Problem Relation Age of Onset   Arthritis Other    Cancer Other    Diabetes Other    Cancer Mother     Colon cancer Neg Hx     Social History   Tobacco Use   Smoking status: Former    Packs/day: 3.00    Years: 30.00    Pack years: 90.00    Types: Cigarettes    Quit date: 10/11/1993    Years since quitting: 27.3   Smokeless tobacco: Former   Tobacco comments:    Quit approx 20 years ago  Building services engineer Use: Never used  Substance Use Topics   Alcohol use: No    Alcohol/week: 0.0 standard drinks   Drug use: No    Home Medications Prior to Admission medications   Medication Sig Start Date End Date Taking? Authorizing Provider  amLODipine (NORVASC) 2.5 MG tablet Take 2.5 mg by mouth daily.  12/11/17   [provider]  aspirin EC 81 MG tablet Take 81 mg by mouth daily.     [provider]  atorvastatin (LIPITOR) 40 MG tablet Take 40 mg by mouth daily. 12/26/20   [provider]  bismuth subsalicylate (PEPTO BISMOL) 262 MG chewable tablet Chew 262 mg by mouth as needed.    [provider]  carbidopa-levodopa (SINEMET IR) 25-100 MG tablet Take 1 tablet by mouth 3 (three) times daily. 12/06/20   [provider]  cholecalciferol (VITAMIN D) 1000 units tablet Take 1,000 Units by mouth daily.    [provider]  cilostazol (PLETAL) 100 MG tablet TAKE 1 TABLET(100 MG) BY MOUTH TWICE DAILY BEFORE A MEAL 03/26/20   Maeola Harman, MD  [facility] Liver Oil 1000 MG CAPS Take 1 capsule by mouth daily.    [provider]  Cyanocobalamin (VITAMIN B-12) 1000 MCG/15ML LIQD Inject 1,000 mcg into the muscle every 30 (thirty) days. Once a month    [provider]  Cyanocobalamin (VITAMIN B12 PO) Take 1 tablet by mouth daily.    [provider]  divalproex (DEPAKOTE) 250 MG DR tablet Take 3 tablets by mouth daily. 01/04/21   [provider]  donepezil (ARICEPT) 10 MG tablet Take 10 mg by mouth at bedtime.  07/16/17   [provider]  Fiber POWD Take by mouth. 1 tsp daily    [provider]  fish  oil-omega-3 fatty acids 1000 MG capsule Take 1 g by mouth 2 (two) times daily.     [provider]  fluticasone (FLONASE) 50 MCG/ACT nasal spray Place 2 sprays into both nostrils daily.    [provider]  gabapentin (NEURONTIN) 100 MG capsule Take 100 mg by mouth 3 (three) times daily.    [provider]  insulin aspart (NOVOLOG) 100 UNIT/ML injection Inject 24-36 Units into the skin See admin instructions. 24 units at lunch time, and 36 units at dinner    [provider]  insulin detemir (LEVEMIR) 100 UNIT/ML injection Inject 36-55 Units into the skin 2 (two) times daily. 55 units each morning, and 36 units at bedtime    [provider]  lisinopril (PRINIVIL,ZESTRIL) 40 MG tablet Take 40 mg by mouth daily.  [provider]  loperamide (IMODIUM A-D) 2 MG tablet Take 2 mg by mouth as needed for diarrhea or loose stools.    [provider]  loratadine (CLARITIN) 10 MG tablet Take 10 mg by mouth daily.    [provider]  meloxicam (MOBIC) 7.5 MG tablet TAKE 1 TABLET(7.5 MG) BY MOUTH DAILY 09/03/20   Vickki Hearing, MD  memantine (NAMENDA) 5 MG tablet Take 5 mg by mouth 2 (two) times daily.    [provider]  Multiple Vitamin (MULTIVITAMIN WITH MINERALS) TABS tablet Take 1 tablet by mouth daily.    [provider]  niacin (SLO-NIACIN) 500 MG tablet Take 500 mg by mouth at bedtime.    [provider]  Omega-3 Fatty Acids (FISH OIL) 1000 MG CAPS Take 1 capsule by mouth daily.    [provider]  pantoprazole (PROTONIX) 40 MG tablet Take 40 mg by mouth daily.    [provider]  QUEtiapine (SEROQUEL) 25 MG tablet Take 25 mg by mouth at bedtime. 12/05/20   [provider]  Semaglutide 3 MG TABS Take 1 tablet by mouth daily.    [provider]  traMADol (ULTRAM) 50 MG tablet Take 1 tablet (50 mg total) by mouth every 6 (six) hours as needed. Patient not taking: No sig  reported 02/10/19   Vickki Hearing, MD    Allergies    Patient has no known allergies.  Review of Systems   Review of Systems  Unable to perform ROS: Dementia   Physical Exam Updated Vital Signs BP 126/68   Pulse 87   Temp 97.6 F (36.4 C) (Oral)   Resp (!) 30   Ht  (1.778 m)   Wt 83 kg   SpO2 97%   BMI 26.26 kg/m   Physical Exam Vitals and nursing note reviewed.  Constitutional:      General: He is not in acute distress.    Appearance: He is well-developed.  HENT:     Head: Normocephalic and atraumatic.     Mouth/Throat:     Mouth: Mucous membranes are moist.  Eyes:     General:        Right eye: No discharge.        Left eye: No discharge.     Conjunctiva/sclera: Conjunctivae normal.  Neck:     Trachea: No tracheal deviation.  Cardiovascular:     Rate and Rhythm: Normal rate and regular rhythm.     Heart sounds: No murmur heard. Pulmonary:     Effort: Pulmonary effort is normal.     Breath sounds: Normal breath sounds.  Abdominal:     General: There is no distension.     Palpations: Abdomen is soft.     Tenderness: There is no abdominal tenderness. There is no guarding.  Musculoskeletal:     Cervical back: Normal range of motion and neck supple. No rigidity.  Skin:    General: Skin is warm.     Capillary Refill: Capillary refill takes less than 2 seconds.     Findings: Rash present.  Neurological:     General: No focal deficit present.     Mental Status: He is alert.     GCS: GCS eye subscore is 4. GCS verbal subscore is 4. GCS motor subscore is 5.     Comments: Patient pupils equal 2 mm bilateral, difficult exam as patient only occasional follows directions.  General weakness on exam.  Dementia clinically with minimal agitation.  Psychiatric:     Comments: Patient dementia clinically, minimal agitation    ED Results / Procedures / Treatments   Labs (all labs ordered are listed, but only abnormal results are displayed) Labs Reviewed   COMPREHENSIVE METABOLIC PANEL - Abnormal; Notable for the following components:      Result Value   Glucose, Bld 167 (*)    Creatinine, Ser 1.48 (*)    Calcium 8.8 (*)    GFR, Estimated 45 (*)    All other components within normal limits  CBC WITH DIFFERENTIAL/PLATELET - Abnormal; Notable for the following components:   RBC 3.72 (*)    Hemoglobin 10.7 (*)    HCT 33.2 (*)    Platelets 114 (*)    Abs Immature Granulocytes 0.09 (*)    All other components within normal limits  RESP PANEL BY RT-PCR (FLU A&B, COVID) ARPGX2  ETHANOL  AMMONIA  TSH  URINALYSIS, ROUTINE W REFLEX MICROSCOPIC    EKG EKG Interpretation  Date/Time:  Thursday February 21 2021 13:28:45 EDT Ventricular Rate:  99 PR Interval:  177 QRS Duration: 132 QT Interval:  378 QTC Calculation: 486 R Axis:   -74 Text Interpretation: Sinus rhythm RBBB and LAFB Confirmed by Blane Ohara [phone]) on 02/21/2021 4:10:05 PM  Radiology CT Head Wo Contrast  Result Date: 02/21/2021 CLINICAL DATA:  Dementia, Parkinson's disease, hypoglycemia, altered level of consciousness EXAM: CT HEAD WITHOUT CONTRAST TECHNIQUE: Contiguous axial images were obtained from the base of the skull through the vertex without intravenous contrast. COMPARISON:  06/05/2020 FINDINGS: Brain: No acute infarct or hemorrhage. Chronic small vessel ischemic changes are seen within the periventricular white matter. Lateral ventricles and midline structures are unremarkable. Stable bilateral less than 3 mm chronic subdural hematomas are again noted along the convexities. No acute extra-axial fluid collections. No mass effect. Vascular: No hyperdense vessel or unexpected calcification. Skull: Normal. Negative for fracture or focal lesion. Sinuses/Orbits: Polypoid mucosal thickening left sphenoid sinus. Remaining sinuses are clear. Chronic left mastoid effusion. Other: None. IMPRESSION: 1. Stable less than 3 mm chronic bilateral subdural hematomas or dural thickening. No  acute hemorrhage. 2. Chronic small vessel ischemic changes.  No acute infarct. Electronically Signed   By: Sharlet Salina M.D.   On: 02/21/2021 15:56    Procedures Procedures   Medications Ordered in ED Medications  sodium chloride 0.9 % bolus 500 mL (has no administration in time range)    ED Course  I have reviewed the triage vital signs and the nursing notes.  Pertinent labs & imaging results that were available during my care of the patient were reviewed by me and considered in my medical decision making (see chart for details).    MDM Rules/Calculators/A&P                           Patient with dementia and Parkinson's history presents with worsening agitation, behavioral and confusion over the past 1 to 2 weeks.  Clinically difficult exam due to baseline dementia, no obvious focal neurodeficits.  No fever in the ER, normal heart rate.  Plan for general medical work-up including blood work to check for anemia, electrolyte issues, urine to check for urine infection, CT scan of the head reviewed no acute hemorrhage, chronic small subdurals.  COVID test negative.  Glucose normal 167, patient did have hypoglycemic episode recently.  Acute renal failure creatinine 1.48.  500 cc IV fluids ordered.  Paged social work/case management to assist this patient eventually  will need nursing home placement.  Paged hospitalist for admission until that can be coordinated especially with his hypoglycemic episode, acute renal failure.   Final Clinical Impression(s) / ED Diagnoses Final diagnoses:  Agitation  Dementia associated with other underlying disease with behavioral disturbance (HCC)  Acute renal insufficiency    Rx / DC Orders ED Discharge Orders     None        Blane OharaZavitz, Tyson Parkison, MD 02/21/21 1617

## 2021-02-21 NOTE — TOC Initial Note (Signed)
TOC consulted for nursing home placement. CSW requested that Attending MD place orders for PT to evaluate pt as their recommendation is needed for referral to be sent to local SNFs.   CSW spoke with pts wife [name] about placement, CSW informed [name] that TOC does not place long term from the ED. However, if PT recommends SNF for pt we can work on rehab placement. This is what [name] would like as she feels pt was doing okay with HH but they started coming less and less each week. She states that when they were coming often pt was walking with his walker and some assistance. She feels like he would benefit from rehab. CSW to send out pts referral when PT has made recommendation. TOC to follow.

## 2021-02-21 NOTE — ED Triage Notes (Addendum)
Pt brought in by [facility] from home with c/o increased agitation over the past week. Wife reported hx of Parkinsons disease and dementia to EMS. Wife also reported she is unable to care for him any longer at home and needs him to be placed in a nursing facility. EMS was called out to pt's home this morning due to hypoglycemia and fall but pt did not come in to be evaluated. Wound care nurse at pt's home reported to EMS that pt has a wound to left foot and left buttocks. Pt's left eye appears red and pt complained to EMS about it bothering him.

## 2021-02-22 DIAGNOSIS — N390 Urinary tract infection, site not specified: Secondary | ICD-10-CM | POA: Diagnosis not present

## 2021-02-22 DIAGNOSIS — F0281 Dementia in other diseases classified elsewhere with behavioral disturbance: Secondary | ICD-10-CM | POA: Diagnosis not present

## 2021-02-22 DIAGNOSIS — N179 Acute kidney failure, unspecified: Secondary | ICD-10-CM

## 2021-02-22 DIAGNOSIS — E1169 Type 2 diabetes mellitus with other specified complication: Secondary | ICD-10-CM

## 2021-02-22 DIAGNOSIS — G20 Parkinson's disease: Secondary | ICD-10-CM | POA: Diagnosis not present

## 2021-02-22 DIAGNOSIS — Z794 Long term (current) use of insulin: Secondary | ICD-10-CM

## 2021-02-22 LAB — BASIC METABOLIC PANEL WITH GFR
Anion gap: 8 (ref 5–15)
BUN: 16 mg/dL (ref 8–23)
CO2: 23 mmol/L (ref 22–32)
Calcium: 9 mg/dL (ref 8.9–10.3)
Chloride: 107 mmol/L (ref 98–111)
Creatinine, Ser: 1.2 mg/dL (ref 0.61–1.24)
GFR, Estimated: 58 mL/min — ABNORMAL LOW
Glucose, Bld: 112 mg/dL — ABNORMAL HIGH (ref 70–99)
Potassium: 4.2 mmol/L (ref 3.5–5.1)
Sodium: 138 mmol/L (ref 135–145)

## 2021-02-22 LAB — CBC
HCT: 36.3 % — ABNORMAL LOW (ref 39.0–52.0)
Hemoglobin: 11.6 g/dL — ABNORMAL LOW (ref 13.0–17.0)
MCH: 28.4 pg (ref 26.0–34.0)
MCHC: 32 g/dL (ref 30.0–36.0)
MCV: 88.8 fL (ref 80.0–100.0)
Platelets: 112 K/uL — ABNORMAL LOW (ref 150–400)
RBC: 4.09 MIL/uL — ABNORMAL LOW (ref 4.22–5.81)
RDW: 14.2 % (ref 11.5–15.5)
WBC: 6 K/uL (ref 4.0–10.5)
nRBC: 0 % (ref 0.0–0.2)

## 2021-02-22 LAB — GLUCOSE, CAPILLARY
Glucose-Capillary: 88 mg/dL (ref 70–99)
Glucose-Capillary: 136 mg/dL — ABNORMAL HIGH (ref 70–99)
Glucose-Capillary: 166 mg/dL — ABNORMAL HIGH (ref 70–99)
Glucose-Capillary: 159 mg/dL — ABNORMAL HIGH (ref 70–99)

## 2021-02-22 LAB — HEMOGLOBIN A1C
Hgb A1c MFr Bld: 5.8 % — ABNORMAL HIGH (ref 4.8–5.6)
Mean Plasma Glucose: 120 mg/dL

## 2021-02-22 MED ORDER — MORPHINE SULFATE (PF) 2 MG/ML IV SOLN
2.0000 mg | Freq: Once | INTRAVENOUS | Status: AC
  Administered 2021-02-22: 2 mg via INTRAVENOUS
  Filled 2021-02-22: qty 1

## 2021-02-22 NOTE — Evaluation (Signed)
Clinical/Bedside Swallow Evaluation Patient Details  Name: [name] MRN: [id number] Date of Birth: January 10, 1930  Today's Date: 02/22/2021 Time: SLP Start Time (ACUTE ONLY): 1451 SLP Stop Time (ACUTE ONLY): 1507 SLP Time Calculation (min) (ACUTE ONLY): 16 min  Past Medical History:  Past Medical History:  Diagnosis Date   Arthritis    Dementia (HCC)    early stages   Diabetes mellitus    Eosinophilic colitis    GERD (gastroesophageal reflux disease)    HOH (hard of hearing)    Hypercholesterolemia    Hypertension    Past Surgical History:  Past Surgical History:  Procedure Laterality Date   CATARACT EXTRACTION W/PHACO Left 03/07/2014   Procedure: CATARACT EXTRACTION PHACO AND INTRAOCULAR LENS PLACEMENT LEFT EYE CDE=7.11;  Surgeon: Loraine Leriche T. Nile Riggs, MD;  Location: [facility];  Service: Ophthalmology;  Laterality: Left;   CATARACT EXTRACTION W/PHACO Right 03/14/2014   Procedure: CATARACT EXTRACTION PHACO AND INTRAOCULAR LENS PLACEMENT RIGHT EYE CDE=10.62;  Surgeon: Loraine Leriche T. Nile Riggs, MD;  Location: [facility];  Service: Ophthalmology;  Laterality: Right;   COLONOSCOPY  2009   Dr. Darrick Penna: pancolonic diverticulosis, internal hemorrhoids   COLONOSCOPY N/A 03/29/2015   RMR: inflammatory changes in the proximal rectum, colon and  terminal ileum suspicious for inflammatory bowel disease. ie. primarily Crohns colitis. Status post biopsy as described. concomitant NSAID exposure could excaerbate pre-existing inflammatory bowel disease but I do not think todays finding are primarly related to recent ibuprofen use.    KNEE ARTHROSCOPY Left    RESECTION DISTAL CLAVICAL Left 10/15/2012   Procedure: RESECTION DISTAL CLAVICAL;  Surgeon: Vickki Hearing, MD;  Location: [facility];  Service: Orthopedics;  Laterality: Left;   SHOULDER ACROMIOPLASTY Left 10/15/2012   Procedure: SHOULDER ACROMIOPLASTY;  Surgeon: Vickki Hearing, MD;  Location: [facility];  Service: Orthopedics;  Laterality: Left;   SHOULDER  ARTHROSCOPY WITH OPEN ROTATOR CUFF REPAIR Left 10/15/2012   Procedure: SHOULDER ARTHROSCOPY WITH OPEN ROTATOR CUFF REPAIR;  Surgeon: Vickki Hearing, MD;  Location: [facility];  Service: Orthopedics;  Laterality: Left;  open at 0912; with bicep tendon debridement;    SHOULDER OPEN ROTATOR CUFF REPAIR Right 08/20/2017   Procedure: ROTATOR CUFF REPAIR SHOULDER OPEN with removal of distal clavicle;  Surgeon: Vickki Hearing, MD;  Location: [facility];  Service: Orthopedics;  Laterality: Right;   TONSILLECTOMY     HPI:  [name]  is a 87 y.o. male, past medical history of diabetes mellitus, insulin-dependent, dementia, GERD, Parkinson disease, hypertension, patient was brought to ED by his wife secondary to ental status, she reports patient with worsening agitation, confusion, progressive over the past week, as well patient with episodes of hypoglycemia, and fall where EMS assessed him for couple times, but patient refused evaluation, patient had wounds to the left buttocks, and left foot, wife report usually patient using walker and wheelchair, but he is with increased falls recently.  -In ED his work-up was significant for creatinine of 1.48, platelet of 114 K, hemoglobin of 10.7, urinalysis is positive with significant pyuria and positive leukocyte esterase, his CT head with stable less than 3 mm chronic bilateral subdural hematomas or dural thickening, with no acute hemorrhage.   Assessment / Plan / Recommendation Clinical Impression  Clinical swallowing evaluation completed while Pt was sitting upright in bed; Pt required mod/max cues to rouse but did become alert and appropriate for PO trials. Pt consumed thin liquids and puree textures without overt s/sx of aspiration. With regular textures note uncoordinated oral stage followed  by mod oral residue after the swallow that was cleared with liquid wash. Recommend continue with D2/fine chop diet and thin liquids. Recommend meds to be crushed in puree.  ST will f/u X1 to ensure diet tolerance, thank you. SLP Visit Diagnosis: Dysphagia, unspecified (R13.10)    Aspiration Risk  Mild aspiration risk    Diet Recommendation Dysphagia 2 (Fine chop);Thin liquid   Liquid Administration via: Cup;Straw Medication Administration: Crushed with puree Supervision: Full supervision/cueing for compensatory strategies;Staff to assist with self feeding Compensations: Slow rate;Small sips/bites;Minimize environmental distractions Postural Changes: Seated upright at 90 degrees    Other  Recommendations Oral Care Recommendations: Oral care BID   Follow up Recommendations 24 hour supervision/assistance;Skilled Nursing facility        Swallow Study   General Date of Onset: 02/22/21 HPI: [name]  is a 87 y.o. male, past medical history of diabetes mellitus, insulin-dependent, dementia, GERD, Parkinson disease, hypertension, patient was brought to ED by his wife secondary to ental status, she reports patient with worsening agitation, confusion, progressive over the past week, as well patient with episodes of hypoglycemia, and fall where EMS assessed him for couple times, but patient refused evaluation, patient had wounds to the left buttocks, and left foot, wife report usually patient using walker and wheelchair, but he is with increased falls recently.  -In ED his work-up was significant for creatinine of 1.48, platelet of 114 K, hemoglobin of 10.7, urinalysis is positive with significant pyuria and positive leukocyte esterase, his CT head with stable less than 3 mm chronic bilateral subdural hematomas or dural thickening, with no acute hemorrhage. Type of Study: Bedside Swallow Evaluation Previous Swallow Assessment: none in chart Diet Prior to this Study: Dysphagia 2 (chopped);Thin liquids Temperature Spikes Noted: No Respiratory Status: Room air History of Recent Intubation: No Behavior/Cognition: Alert;Cooperative;Pleasant mood Oral Cavity  Assessment: Within Functional Limits Oral Care Completed by SLP: Recent completion by staff Oral Cavity - Dentition: Dentures, top;Dentures, bottom Vision: Functional for self-feeding Self-Feeding Abilities: Able to feed self;Total assist;Needs assist;Needs set up Patient Positioning: Upright in bed Baseline Vocal Quality: Normal Volitional Cough: Strong Volitional Swallow: Able to elicit    Oral/Motor/Sensory Function Overall Oral Motor/Sensory Function: Within functional limits   Ice Chips Ice chips: Within functional limits   Thin Liquid Thin Liquid: Within functional limits    Nectar Thick Nectar Thick Liquid: Not tested   Honey Thick Honey Thick Liquid: Not tested   Puree Puree: Within functional limits   Solid     Solid: Impaired Oral Phase Impairments: Reduced lingual movement/coordination;Poor awareness of bolus Oral Phase Functional Implications: Prolonged oral transit;Oral residue Pharyngeal Phase Impairments: Suspected delayed Swallow     [name] H. Romie Levee, CCC-SLP Speech Language Pathologist  Georgetta Haber 02/22/2021,3:07 PM

## 2021-02-22 NOTE — TOC Initial Note (Signed)
Transition of Care [facility]) - Initial/Assessment Note    Patient Details  Name: [name] MRN: [id number] Date of Birth: [birth year]  Transition of Care [facility]) CM/SW Contact:    Barry Brunner, LCSW Phone Number: 02/22/2021, 3:16 PM  Clinical Narrative:                 Patient is a 87 year old male admitted for Dementia due to Parkinson's disease with behavioral disturbance (HCC). CSW conducted initial assessment. Patient lives at home with his wife. Patient's wife reported that patient needs mod to max assist with ADL's. Patients wife reported that she is not able to assist in transfers and is agreeable to SNF. CSW completed FL2 and faxed out patient TOC to follow.  Expected Discharge Plan: Skilled Nursing Facility Barriers to Discharge: Barriers Resolved   Patient Goals and CMS Choice Patient states their goals for this hospitalization and ongoing recovery are:: rehab with SNF CMS Medicare.gov Compare Post Acute Care list provided to:: Patient Choice offered to / list presented to : Patient  Expected Discharge Plan and Services Expected Discharge Plan: Skilled Nursing Facility       Living arrangements for the past 2 months: Single Family Home                                      Prior Living Arrangements/Services Living arrangements for the past 2 months: Single Family Home Lives with:: Self, Spouse Patient language and need for interpreter reviewed:: Yes Do you feel safe going back to the place where you live?: Yes      Need for Family Participation in Patient Care: Yes (Comment) Care giver support system in place?: Yes (comment)   Criminal Activity/Legal Involvement Pertinent to Current Situation/Hospitalization: No - Comment as needed  Activities of Daily Living      Permission Sought/Granted Permission sought to share information with : Family Supports Permission granted to share information with : Yes, Verbal Permission Granted  Share Information with  NAME: Deondra, Wigger  Permission granted to share info w AGENCY: Local SNF's  Permission granted to share info w Relationship: (Spouse)  Permission granted to share info w Contact Information: [phone]  Emotional Assessment       Orientation: : Oriented to Self Alcohol / Substance Use: Not Applicable Psych Involvement: No (comment)  Admission diagnosis:  Agitation [R45.1] Acute renal insufficiency [N28.9] Failure to thrive in adult [R62.7] Dementia associated with other underlying disease with behavioral disturbance (HCC) [F02.81] Patient Active Problem List   Diagnosis Date Noted   Failure to thrive in adult 02/21/2021   Dementia due to Parkinson's disease with behavioral disturbance (HCC) 02/21/2021   Parkinson disease (HCC) 02/21/2021   Dementia associated with other underlying disease with behavioral disturbance (HCC)    Acute lower UTI    Diarrhea 04/23/2018   Abdominal pain 04/23/2018   Atherosclerosis of artery of extremity with intermittent claudication (HCC) 03/03/2018   AC joint arthropathy    Eosinophilic colitis 15/72/6203   IBD (inflammatory bowel disease)    Mucosal abnormality of colon    Diverticulosis of colon with hemorrhage    Intractable diarrhea    Leukocytosis    Acute renal failure (HCC) 03/25/2015   Diabetes (HCC) 03/25/2015   Chronic diarrhea of unknown origin 03/25/2015   Rotator cuff tear 01/04/2013   S/P rotator cuff repair right  08/20/17 01/04/2013   Arthritis, shoulder region 10/15/2012   Biceps  tendonitis on left 10/15/2012   Adhesive bursitis of left shoulder 09/01/2012   Partial tear of rotator cuff(726.13) 09/01/2012   Pain in joint, shoulder region 07/09/2011   Muscle weakness (generalized) 07/09/2011   Bursitis of shoulder, left 07/03/2011   PCP:  [name], FNP Pharmacy:   [facility] [phone] - [place], [place] - [address] AT [facility] OF [address] [place] [address] Phone:  [phone] Fax: [phone]     Social Determinants of Health (SDOH) Interventions    Readmission Risk Interventions No flowsheet data found.

## 2021-02-22 NOTE — Progress Notes (Signed)
PROGRESS NOTE    Patient: [name]                            PCP: [name], FNP                    DOB: 1930-05-07            DOA: 02/21/2021 [id number]:[id number]             DOS: 02/22/2021, 10:50 AM   LOS: 0 days   Date of Service: The patient was seen and examined on 02/22/2021  Subjective:   The patient was seen and examined this morning. Hemodynamically stable, awake sitting up in bed, pleasantly confused very poor insight Does not really understand any verbal commands. Sitter at bedside, no signs of agitation or aggression at this time  Brief Narrative:   [name]  is a 87 y.o. male, past medical history of diabetes mellitus, insulin-dependent, dementia, GERD, Parkinson disease, hypertension, patient was brought to ED by his wife secondary to change in his mental status. She reports that the patient's agitation confusion is progressively getting worse especially over past week..  Patient also has had episodes of hypoglycemia and falls.  patient had wounds to the left buttocks, and left foot, wife report usually patient using walker and wheelchair, but he is with increased falls recently. ED his work-up was significant for creatinine of 1.48, platelet of 114 K, hemoglobin of 10.7, urinalysis is positive with significant pyuria and positive leukocyte esterase, his CT head with stable less than 3 mm chronic bilateral subdural hematomas or dural thickening, with no acute hemorrhage.     Assessment & Plan:   Active Problems:   Acute renal failure (HCC)   Diabetes (HCC)   Failure to thrive in adult   Dementia due to Parkinson's disease with behavioral disturbance (HCC)   Parkinson disease (HCC)   Dementia associated with other underlying disease with behavioral disturbance (HCC)   Acute lower UTI      Failure to thrive/with encephalopathy -Progressing to severe debility, worsening mentation (poor interaction, poor p.o. intake) -Encephalopathy likely metabolic  worsening by UTI-and underlying Parkinson dementia -Continue to treat underlying cause of infection UTI, -Encourage p.o. intake, -We will consult dietitian -Started PT OT, patient not ambulating, recommending SNF placement  Acute metabolic encephalopathy -Complicated by UTI, progressive Parkinson dementia, failure to thrive Poor p.o. intake leading to dehydration, malnutrition -CT of the head revealing old subdural hematoma likely not contributing Ammonia level normal      UTI -We will follow with urine culture -Continue currently started antibiotics of IV Rocephin   Parkinson disease-Parkinson dementia -We will continue with home medications include Sinemet -It seems that he is progressively declining with debility, poor mentation, No visible tremors at this time   Dementia -Continue with home medication include Depakote, Aricept and Namenda -We will hold Seroquel given altered mental status   Diabetes mellitus II  -Patient is on Lantus at home, resumed at lower dose due to poor p.o. intake -Checking CBG QA CHS, SSI coverage -will hold semaglutide   Hypertension  - continue with home medications -Stable   Hyperlipidemia - continue with statin -Stable   GERD - on PPI -stable no changes   CKD stage III  -Monitoring BUN/creatinine closely, avoiding nephrotoxins, -Creatinine at baseline   PVD -continue with cilostazol and statin -Stable   Subdural hematoma  -CT head showing stable less than 3 mm chronic  bilateral subdural hematomas or dural thickening. No acute hemorrhage.  -We will continue to monitor      Skin Assessment: I have examined the patient's skin and I agree with the wound assessment as performed by wound care team As outlined belowe: Pressure Injury 02/21/21 Buttocks Left;Medial Stage 2 -  Partial thickness loss of dermis presenting as a shallow open injury with a red, pink wound bed without slough. nickel sized, pink wound base, no drainage  noted (Active)  02/21/21 1845  Location: Buttocks  Location Orientation: Left;Medial  Staging: Stage 2 -  Partial thickness loss of dermis presenting as a shallow open injury with a red, pink wound bed without slough.  Wound Description (Comments): nickel sized, pink wound base, no drainage noted  Present on Admission: Yes     Pressure Injury 02/21/21 Buttocks Right;Medial Stage 2 -  Partial thickness loss of dermis presenting as a shallow open injury with a red, pink wound bed without slough. pencil eraser sized, pink base, no drainage (Active)  02/21/21 1845  Location: Buttocks  Location Orientation: Right;Medial  Staging: Stage 2 -  Partial thickness loss of dermis presenting as a shallow open injury with a red, pink wound bed without slough.  Wound Description (Comments): pencil eraser sized, pink base, no drainage  Present on Admission: Yes   Ethics: Due to multiple comorbidities status decline, physically and mentally-prognosis remains poor, CODE STATUS remains DNR/DNI -currently we are recommending comfort care measures Possible hospice Palliative care consulted to determine goal of care  Severe debility -patient is now ambulating, status post evaluation by PT OT, recommending SNF  ------------------------------------------------------------------------------------------------------------------------------------------------- Cultures; Urine Culture  >>> NGT    Antimicrobials: IV Rocephin   Consultants: PT/OT   ------------------------------------------------------------------------------------------------------------------------------------------------  DVT prophylaxis:  SCD/Compression stockings and Heparin SQ Code Status:   Code Status: DNR Code Status DNR confirmed by wife  Family Communication: No family member present at bedside- attempt will be made to update his wife daily The above findings and plan of care has been discussed with patient (and family)  in  detail,  they expressed understanding and agreement of above. -Advance care planning has been discussed.   Admission status:   Status is: Inpt   The patient will require care spanning > 2 midnights and should be moved to inpatient because: Inpatient level of care appropriate due to severity of illness  Dispo: The patient is from: Home              Anticipated d/c is to: SNF in 2-3 days               Patient currently is not medically stable to d/c.   Difficult to place patient No      Level of care: Telemetry   Procedures:   No admission procedures for hospital encounter.    Antimicrobials:  Anti-infectives (From admission, onward)    Start     Dose/Rate Route Frequency Ordered Stop   02/21/21 2130  cefTRIAXone (ROCEPHIN) 1 g in sodium chloride 0.9 % 100 mL IVPB        1 g 200 mL/hr over 30 Minutes Intravenous Every 24 hours 02/21/21 2038          Medication:   amLODipine  2.5 mg Oral q morning   aspirin EC  81 mg Oral Daily   atorvastatin  40 mg Oral Daily   carbidopa-levodopa  1 tablet Oral TID   cilostazol  100 mg Oral Daily   divalproex  750 mg Oral  Daily   donepezil  10 mg Oral QHS   gabapentin  100 mg Oral TID   heparin  5,000 Units Subcutaneous Q8H   insulin aspart  0-5 Units Subcutaneous QHS   insulin aspart  0-9 Units Subcutaneous TID WC   lisinopril  20 mg Oral Daily   memantine  5 mg Oral BID   pantoprazole  40 mg Oral Daily   vitamin B-12  100 mcg Oral q morning       Objective:   Vitals:   02/21/21 2227 02/22/21 0200 02/22/21 0229 02/22/21 0505  BP: 125/64 122/64 124/64 132/70  Pulse: 88 89 89 82  Resp: Temp: 97.7 F (36.5 C) 98.7 F (37.1 C) 98.7 F (37.1 C) 98.4 F (36.9 C)  TempSrc: Oral Axillary    SpO2: 100% 100% 100% 96%  Weight:      Height:        Intake/Output Summary (Last 24 hours) at 02/22/2021 1050 Last data filed at 02/22/2021 1610 Gross per 24 hour  Intake 1342 ml  Output 650 ml  Net 692 ml    Filed Weights   02/21/21 1331  Weight: 83 kg     Examination:   Physical Exam  Constitution: Awake, conversation nonmeaningful, poor insight Psychiatric: Severely demented, poor insight normal and stable mood and affect, cognition intact,   HEENT: Normocephalic, PERRL, otherwise with in Normal limits  Chest:Chest symmetric Cardio vascular:  S1/S2, RRR, No murmure, No Rubs or Gallops  pulmonary: Clear to auscultation bilaterally, respirations unlabored, negative wheezes / crackles Abdomen: Soft, non-tender, non-distended, bowel sounds,no masses, no organomegaly Muscular skeletal: Severe global weakness  Limited exam - in bed, able to move all 4 extremities,  Neuro: CNII-XII intact. , normal motor and sensation, reflexes intact  Extremities: No pitting edema lower extremities, +2 pulses  Skin: Dry, warm to touch, negative for any Rashes, No open wounds Wounds: per nursing documentation Pressure Injury 02/21/21 Buttocks Left;Medial Stage 2 -  Partial thickness loss of dermis presenting as a shallow open injury with a red, pink wound bed without slough. nickel sized, pink wound base, no drainage noted (Active)  02/21/21 1845  Location: Buttocks  Location Orientation: Left;Medial  Staging: Stage 2 -  Partial thickness loss of dermis presenting as a shallow open injury with a red, pink wound bed without slough.  Wound Description (Comments): nickel sized, pink wound base, no drainage noted  Present on Admission: Yes     Pressure Injury 02/21/21 Buttocks Right;Medial Stage 2 -  Partial thickness loss of dermis presenting as a shallow open injury with a red, pink wound bed without slough. pencil eraser sized, pink base, no drainage (Active)  02/21/21 1845  Location: Buttocks  Location Orientation: Right;Medial  Staging: Stage 2 -  Partial thickness loss of dermis presenting as a shallow open injury with a red, pink wound bed without slough.  Wound Description (Comments): pencil eraser  sized, pink base, no drainage  Present on Admission: Yes    ------------------------------------------------------------------------------------------------------------------------------------------    LABs:  CBC Latest Ref Rng & Units 02/22/2021 02/21/2021 01/30/2021  WBC 4.0 - 10.5 K/uL 6.0 5.2 7.2  Hemoglobin 13.0 - 17.0 g/dL 11.6(L) 10.7(L) 10.3(L)  Hematocrit 39.0 - 52.0 % 36.3(L) 33.2(L) 32.0(L)  Platelets 150 - 400 K/uL 112(L) 114(L) 125(L)   CMP Latest Ref Rng & Units 02/22/2021 02/21/2021 01/30/2021  Glucose 70 - 99 mg/dL 960(A) 540(J) 79  BUN 8 - 23 mg/dL Creatinine 0.61 -  1.24 mg/dL 8.76 8.11(X) 7.26  Sodium 135 - 145 mmol/L 138 137 136  Potassium 3.5 - 5.1 mmol/L 4.2 4.3 3.6  Chloride 98 - 111 mmol/L 107 105 105  CO2 22 - 32 mmol/L 23 24 21(L)  Calcium 8.9 - 10.3 mg/dL 9.0 2.0(B) 5.5(H)  Total Protein 6.5 - 8.1 g/dL - 7.0 -  Total Bilirubin 0.3 - 1.2 mg/dL - 0.6 -  Alkaline Phos 38 - 126 U/L - 68 -  AST 15 - 41 U/L - 15 -  ALT 0 - 44 U/L - 8 -       Micro Results Recent Results (from the past 240 hour(s))  Resp Panel by RT-PCR (Flu A&B, Covid) Nasopharyngeal Swab     Status: None   Collection Time: 02/21/21  1:56 PM   Specimen: Nasopharyngeal Swab; Nasopharyngeal(NP) swabs in vial transport medium  Result Value Ref Range Status   SARS Coronavirus 2 by RT PCR NEGATIVE NEGATIVE Final    Comment: (NOTE) SARS-CoV-2 target nucleic acids are NOT DETECTED.  The SARS-CoV-2 RNA is generally detectable in upper respiratory specimens during the acute phase of infection. The lowest concentration of SARS-CoV-2 viral copies this assay can detect is 138 copies/mL. A negative result does not preclude SARS-Cov-2 infection and should not be used as the sole basis for treatment or other patient management decisions. A negative result may occur with  improper specimen collection/handling, submission of specimen other than nasopharyngeal swab, presence of viral  mutation(s) within the areas targeted by this assay, and inadequate number of viral copies(<138 copies/mL). A negative result must be combined with clinical observations, patient history, and epidemiological information. The expected result is Negative.  Fact Sheet for Patients:  BloggerCourse.com  Fact Sheet for Healthcare Providers:  SeriousBroker.it  This test is no t yet approved or cleared by the [place] FDA and  has been authorized for detection and/or diagnosis of SARS-CoV-2 by FDA under an Emergency Use Authorization (EUA). This EUA will remain  in effect (meaning this test can be used) for the duration of the COVID-19 declaration under Section 564(b)(1) of the Act, 21 U.S.C.section 360bbb-3(b)(1), unless the authorization is terminated  or revoked sooner.       Influenza A by PCR NEGATIVE NEGATIVE Final   Influenza B by PCR NEGATIVE NEGATIVE Final    Comment: (NOTE) The Xpert Xpress SARS-CoV-2/FLU/RSV plus assay is intended as an aid in the diagnosis of influenza from Nasopharyngeal swab specimens and should not be used as a sole basis for treatment. Nasal washings and aspirates are unacceptable for Xpert Xpress SARS-CoV-2/FLU/RSV testing.  Fact Sheet for Patients: BloggerCourse.com  Fact Sheet for Healthcare Providers: SeriousBroker.it  This test is not yet approved or cleared by the [place] FDA and has been authorized for detection and/or diagnosis of SARS-CoV-2 by FDA under an Emergency Use Authorization (EUA). This EUA will remain in effect (meaning this test can be used) for the duration of the COVID-19 declaration under Section 564(b)(1) of the Act, 21 U.S.C. section 360bbb-3(b)(1), unless the authorization is terminated or revoked.  Performed at [facility], [address] [place], [place] [address]     Radiology Reports CT Head Wo  Contrast  Result Date: 02/21/2021 CLINICAL DATA:  Dementia, Parkinson's disease, hypoglycemia, altered level of consciousness EXAM: CT HEAD WITHOUT CONTRAST TECHNIQUE: Contiguous axial images were obtained from the base of the skull through the vertex without intravenous contrast. COMPARISON:  06/05/2020 FINDINGS: Brain: No acute infarct or hemorrhage. Chronic small vessel ischemic changes  are seen within the periventricular white matter. Lateral ventricles and midline structures are unremarkable. Stable bilateral less than 3 mm chronic subdural hematomas are again noted along the convexities. No acute extra-axial fluid collections. No mass effect. Vascular: No hyperdense vessel or unexpected calcification. Skull: Normal. Negative for fracture or focal lesion. Sinuses/Orbits: Polypoid mucosal thickening left sphenoid sinus. Remaining sinuses are clear. Chronic left mastoid effusion. Other: None. IMPRESSION: 1. Stable less than 3 mm chronic bilateral subdural hematomas or dural thickening. No acute hemorrhage. 2. Chronic small vessel ischemic changes.  No acute infarct. Electronically Signed   By: Sharlet SalinaMichael  Brown M.D.   On: 02/21/2021 15:56   DG Chest Portable 1 View  Result Date: 02/21/2021 CLINICAL DATA:  Altered. EXAM: PORTABLE CHEST 1 VIEW COMPARISON:  Radiograph 12/31/2020 FINDINGS: Low lung volumes. Stable heart size and mediastinal contours for technique. Aortic atherosclerosis. Streaky retrocardiac atelectasis. No confluent airspace disease. No pulmonary edema. No pleural effusion or pneumothorax. No acute osseous abnormalities are seen. IMPRESSION: Low lung volumes with retrocardiac atelectasis. Electronically Signed   By: Narda RutherfordMelanie  Sanford M.D.   On: 02/21/2021 17:15    SIGNED: Kendell BaneSeyed A Frank Pilger, MD, FHM. [facility],  Pager (please use [email or web address] to page/text) Please use Epic Secure Chat for non-urgent communication (7AM-7PM)  If 7PM-7AM, please contact  night-coverage [email or web address], 02/22/2021, 10:50 AM

## 2021-02-22 NOTE — Progress Notes (Signed)
Patient stated he needed to go to bathroom, unable to void in urinal. Bladder scan reading 760ml's. In and Out Cath performed. Urine culture obtained and sent to lab. Clear, yellow urine. 650 ml in output.

## 2021-02-22 NOTE — Progress Notes (Signed)
Palliative Medicine RN Note: Consult orders noted for GOC & symptoms. PMT provider will return to [facility] on Monday 7/25, and she will evaluate the pt then if he remains admitted.   If recommendations are needed in the interim, please call our office at [phone].  Margret Chance Aodhan Scheidt, RN, BSN, [facility] Palliative Medicine Team 02/22/2021 9:12 AM Office [phone]

## 2021-02-22 NOTE — Plan of Care (Signed)
  Problem: Acute Rehab PT Goals(only PT should resolve) Goal: Pt Will Go Sit To Supine/Side Outcome: Progressing Flowsheets (Taken 02/22/2021 1030) Pt will go Sit to Supine/Side: with min guard assist Goal: Patient Will Transfer Sit To/From Stand Outcome: Progressing Flowsheets (Taken 02/22/2021 1030) Patient will transfer sit to/from stand:  with minimal assist  with min guard assist Goal: Pt Will Transfer Bed To Chair/Chair To Bed Outcome: Progressing Flowsheets (Taken 02/22/2021 1030) Pt will Transfer Bed to Chair/Chair to Bed:  with min assist  min guard assist Goal: Pt Will Ambulate Outcome: Progressing Flowsheets (Taken 02/22/2021 1030) Pt will Ambulate:  25 feet  with minimal assist  with least restrictive assistive device  10:31 AM, 02/22/21 Wyman Songster PT, DPT Physical Therapist at [facility]

## 2021-02-22 NOTE — Care Management Obs Status (Signed)
MEDICARE OBSERVATION STATUS NOTIFICATION   Patient Details  Name: [name] MRN: [id number] Date of Birth: 10-04-1929   Medicare Observation Status Notification Given:  Yes    Barry Brunner, LCSW 02/22/2021, 4:23 PM

## 2021-02-22 NOTE — Evaluation (Signed)
Occupational Therapy Evaluation Patient Details Name: [name] MRN: [id number] DOB: Sep 18, 1929 Today's Date: 02/22/2021    History of Present Illness [name]  is a 87 y.o. male, past medical history of diabetes mellitus, insulin-dependent, dementia, GERD, Parkinson disease, hypertension, patient was brought to ED by his wife secondary to ental status, she reports patient with worsening agitation, confusion, progressive over the past week, as well patient with episodes of hypoglycemia, and fall where EMS assessed him for couple times, but patient refused evaluation, patient had wounds to the left buttocks, and left foot, wife report usually patient using walker and wheelchair, but he is with increased falls recently.  -In ED his work-up was significant for creatinine of 1.48, platelet of 114 K, hemoglobin of 10.7, urinalysis is positive with significant pyuria and positive leukocyte esterase, his CT head with stable less than 3 mm chronic bilateral subdural hematomas or dural thickening, with no acute hemorrhage.  [facility] hospitalist consulted to admit.   Clinical Impression   Pt mildly reluctant to don socks for transfer but agreeable once seated at EOB. Pt requires min A for bed mobility with labored movement. Pt able to complete sit to stand with Min A using RW. More mod A needed for ~4 feet of ambulation in room forward and backward with pt removing diaper during mobility. Difficulty to assess pt UE ROM and strength due to difficulty following commands. Able to functionally use B UE to manipulate RW. Pt will benefit from continued OT in the hospital and recommended venue below to increase strength, balance, and endurance for safe ADL's.     Follow Up Recommendations  SNF    Equipment Recommendations  None recommended by OT           Precautions / Restrictions Precautions Precautions: Fall Restrictions Weight Bearing Restrictions: No      Mobility Bed Mobility Overal bed  mobility: Needs Assistance Bed Mobility: Supine to Sit;Sit to Supine     Supine to sit: Min assist Sit to supine: Min assist   General bed mobility comments: slow labored movement    Transfers Overall transfer level: Needs assistance Equipment used: Rolling walker (2 wheeled) Transfers: Sit to/from [employer] Sit to Stand: Min assist Stand pivot transfers: Mod assist       General transfer comment: Min A sit for power up from sit to stand. More mod A for ambulation forward and backward ~4 feet prior to sitting.    Balance Overall balance assessment: Needs assistance Sitting-balance support: Feet supported;No upper extremity supported Sitting balance-Leahy Scale: Fair (fair to good) Sitting balance - Comments: EOB   Standing balance support: Bilateral upper extremity supported;During functional activity Standing balance-Leahy Scale: Poor Standing balance comment: using RW                           ADL either performed or assessed with clinical judgement   ADL Overall ADL's : Needs assistance/impaired                     Lower Body Dressing: Total assistance;Sitting/lateral leans Lower Body Dressing Details (indicate cue type and reason): total assist to don socks seated at EOB Toilet Transfer: RW;Moderate assistance;Ambulation;Stand-pivot Toilet Transfer Details (indicate cue type and reason): Partially simulated via sit to stand and ~4 feet of ambulation forward and backward. Per clinical judgement pt likely at level of Mod A with RW.  Vision   Vision Assessment?:  (R eye closed majority of session.)                Pertinent Vitals/Pain Pain Assessment: Faces Faces Pain Scale: Hurts little more Pain Location: buttock area Pain Descriptors / Indicators: Guarding Pain Intervention(s): Limited activity within patient's tolerance;Monitored during session;Repositioned     Hand Dominance      Extremity/Trunk Assessment Upper Extremity Assessment Upper Extremity Assessment: Difficult to assess due to impaired cognition (Pt struggled to follow commands for B UE assessment.)   Lower Extremity Assessment Lower Extremity Assessment: Defer to PT evaluation   Cervical / Trunk Assessment Cervical / Trunk Assessment: Kyphotic   Communication Communication Communication: Expressive difficulties   Cognition Arousal/Alertness: Awake/alert Behavior During Therapy: Agitated Overall Cognitive Status: History of cognitive impairments - at baseline                                                      Home Living Family/patient expects to be discharged to:: Skilled nursing facility (Pt poor historian with expressive difficulties.)                                 Additional Comments: Per document review pt lives with wife who states she can no longer take care of him. Pt reportedly uses a w/c and walker for mobility per document review.      Prior Functioning/Environment Level of Independence: Needs assistance  Gait / Transfers Assistance Needed: assist from wife per document review; pt uses RW and w/c ADL's / Homemaking Assistance Needed: assist from wife per document review            OT Problem List: Decreased strength;Decreased range of motion;Decreased activity tolerance;Impaired balance (sitting and/or standing);Impaired vision/perception;Decreased coordination;Decreased cognition;Decreased safety awareness;Decreased knowledge of use of DME or AE      OT Treatment/Interventions: Self-care/ADL training;Therapeutic exercise;Therapeutic activities;DME and/or AE instruction;Patient/family education;Balance training;Cognitive remediation/compensation;Visual/perceptual remediation/compensation    OT Goals(Current goals can be found in the care plan section) Acute Rehab OT Goals Patient Stated Goal: pt unable to participate OT Goal Formulation:  Patient unable to participate in goal setting Time For Goal Achievement: 03/11/2021 Potential to Achieve Goals: Fair  OT Frequency: Min 2X/week    AM-PAC OT "6 Clicks" Daily Activity     Outcome Measure Help from another person eating meals?: A Little Help from another person taking care of personal grooming?: A Little Help from another person toileting, which includes using toliet, bedpan, or urinal?: A Lot Help from another person bathing (including washing, rinsing, drying)?: A Lot Help from another person to put on and taking off regular upper body clothing?: A Little Help from another person to put on and taking off regular lower body clothing?: A Lot 6 Click Score: 15   End of Session Equipment Utilized During Treatment: Rolling walker  Activity Tolerance: Patient tolerated treatment well Patient left: in bed;with call bell/phone within reach;with bed alarm set;with nursing/sitter in room  OT Visit Diagnosis: Unsteadiness on feet (R26.81);History of falling (Z91.81);Muscle weakness (generalized) (M62.81);Other symptoms and signs involving cognitive function;Cognitive communication deficit (R41.841) Symptoms and signs involving cognitive functions:  (Dementia)                Time: 6767-2094 OT Time Calculation (min): 11 min  Charges:  OT General Charges $OT Visit: 1 Visit OT Evaluation $OT Eval Low Complexity: 1 Low  [name] OT, MOT  Danie Chandler 02/22/2021, 9:53 AM

## 2021-02-22 NOTE — NC FL2 (Signed)
[place] MEDICAID FL2 LEVEL OF CARE SCREENING TOOL     IDENTIFICATION  Patient Name: [name] Birthdate: [birth year] Sex: male Admission Date (Current Location): 02/21/2021  [facility] and [place] Number:  [employer] and Address:  [facility],  [address] [place] [address]      Provider Number: [id number]  Attending Physician Name and Address:  Kendell Bane, MD  Relative Name and Phone Number:  Calub, Tarnow (Spouse)   [phone]    Current Level of Care: Hospital Recommended Level of Care: Skilled Nursing Facility Prior Approval Number:    Date Approved/Denied: 02/22/21 PASRR Number: [id number]  Discharge Plan: SNF    Current Diagnoses: Patient Active Problem List   Diagnosis Date Noted   Failure to thrive in adult 02/21/2021   Dementia due to Parkinson's disease with behavioral disturbance (HCC) 02/21/2021   Parkinson disease (HCC) 02/21/2021   Dementia associated with other underlying disease with behavioral disturbance (HCC)    Acute lower UTI    Diarrhea 04/23/2018   Abdominal pain 04/23/2018   Atherosclerosis of artery of extremity with intermittent claudication (HCC) 03/03/2018   AC joint arthropathy    Eosinophilic colitis 95/18/8416   IBD (inflammatory bowel disease)    Mucosal abnormality of colon    Diverticulosis of colon with hemorrhage    Intractable diarrhea    Leukocytosis    Acute renal failure (HCC) 03/25/2015   Diabetes (HCC) 03/25/2015   Chronic diarrhea of unknown origin 03/25/2015   Rotator cuff tear 01/04/2013   S/P rotator cuff repair right  08/20/17 01/04/2013   Arthritis, shoulder region 10/15/2012   Biceps tendonitis on left 10/15/2012   Adhesive bursitis of left shoulder 09/01/2012   Partial tear of rotator cuff(726.13) 09/01/2012   Pain in joint, shoulder region 07/09/2011   Muscle weakness (generalized) 07/09/2011   Bursitis of shoulder, left 07/03/2011    Orientation RESPIRATION BLADDER  Height & Weight     Self  Normal Continent Weight: 182 lb 15.7 oz (83 kg) Height:  5\' 10"  (177.8 cm)  BEHAVIORAL SYMPTOMS/MOOD NEUROLOGICAL BOWEL NUTRITION STATUS        Diet (DIET DYS 2 Room service appropriate? Yes; Fluid consistency: Thin)  AMBULATORY STATUS COMMUNICATION OF NEEDS Skin   Extensive Assist Verbally Other (Comment) (Preassure injury Stage 2 buttock)                       Personal Care Assistance Level of Assistance  Bathing, Feeding, Dressing Bathing Assistance: Maximum assistance Feeding assistance: Independent Dressing Assistance: Limited assistance     Functional Limitations Info  Sight, Hearing, Speech Sight Info: Impaired Hearing Info: Impaired Speech Info: Adequate    SPECIAL CARE FACTORS FREQUENCY  PT (By licensed PT)     PT Frequency: 5x              Contractures Contractures Info: Not present    Additional Factors Info  Code Status Code Status Info: DNR             Current Medications (02/22/2021):  This is the current hospital active medication list Current Facility-Administered Medications  Medication Dose Route Frequency Provider Last Rate Last Admin   amLODipine (NORVASC) tablet 2.5 mg  2.5 mg Oral q morning Elgergawy, 02/24/2021, MD   2.5 mg at 02/22/21 1221   aspirin EC tablet 81 mg  81 mg Oral Daily Elgergawy, 02/24/21, MD   81 mg at 02/22/21 1035   atorvastatin (LIPITOR) tablet 40 mg  40 mg Oral Daily Elgergawy, Leana Roe, MD   40 mg at 02/22/21 1039   carbidopa-levodopa (SINEMET IR) 25-100 MG per tablet immediate release 1 tablet  1 tablet Oral TID Elgergawy, Leana Roe, MD   1 tablet at 02/22/21 1038   cefTRIAXone (ROCEPHIN) 1 g in sodium chloride 0.9 % 100 mL IVPB  1 g Intravenous Q24H Elgergawy, Leana Roe, MD 200 mL/hr at 02/21/21 2230 1 g at 02/21/21 2230   cilostazol (PLETAL) tablet 100 mg  100 mg Oral Daily Elgergawy, Leana Roe, MD   100 mg at 02/22/21 1039   divalproex (DEPAKOTE) DR tablet 750 mg  750 mg Oral Daily  Elgergawy, Leana Roe, MD   750 mg at 02/22/21 1041   donepezil (ARICEPT) tablet 10 mg  10 mg Oral QHS Elgergawy, Leana Roe, MD       gabapentin (NEURONTIN) capsule 100 mg  100 mg Oral TID Elgergawy, Leana Roe, MD   100 mg at 02/22/21 1040   heparin injection 5,000 Units  5,000 Units Subcutaneous Q8H Elgergawy, Leana Roe, MD   5,000 Units at 02/22/21 0615   insulin aspart (novoLOG) injection 0-5 Units  0-5 Units Subcutaneous QHS Elgergawy, Leana Roe, MD       insulin aspart (novoLOG) injection 0-9 Units  0-9 Units Subcutaneous TID WC Elgergawy, Leana Roe, MD   1 Units at 02/22/21 1221   lisinopril (ZESTRIL) tablet 20 mg  20 mg Oral Daily Elgergawy, Leana Roe, MD   20 mg at 02/22/21 1037   memantine (NAMENDA) tablet 5 mg  5 mg Oral BID Elgergawy, Leana Roe, MD   5 mg at 02/22/21 1036   pantoprazole (PROTONIX) EC tablet 40 mg  40 mg Oral Daily Elgergawy, Leana Roe, MD   40 mg at 02/22/21 1040   vitamin B-12 (CYANOCOBALAMIN) tablet 100 mcg  100 mcg Oral q morning Elgergawy, Leana Roe, MD   100 mcg at 02/22/21 1222     Discharge Medications: Please see discharge summary for a list of discharge medications.  Relevant Imaging Results:  Relevant Lab Results:   Additional Information PT SSN: [id number]  Barry Brunner, LCSW

## 2021-02-22 NOTE — Plan of Care (Signed)
  Problem: Acute Rehab OT Goals (only OT should resolve) Goal: Pt. Will Perform Eating Flowsheets (Taken 02/22/2021 0958) Pt Will Perform Eating:  with modified independence  bed level Goal: Pt. Will Perform Grooming Flowsheets (Taken 02/22/2021 0958) Pt Will Perform Grooming:  with supervision  with min guard assist  standing Goal: Pt. Will Perform Upper Body Dressing Flowsheets (Taken 02/22/2021 0958) Pt Will Perform Upper Body Dressing:  with modified independence  sitting Goal: Pt. Will Perform Lower Body Dressing Flowsheets (Taken 02/22/2021 0958) Pt Will Perform Lower Body Dressing:  with min guard assist  with supervision  sitting/lateral leans Goal: Pt. Will Transfer To Toilet Flowsheets (Taken 02/22/2021 [phone]) Pt Will Transfer to Toilet:  with supervision  with min guard assist  stand pivot transfer  ambulating Goal: Pt/Caregiver Will Perform Home Exercise Program Flowsheets (Taken 02/22/2021 [phone]) Pt/caregiver will Perform Home Exercise Program:  Increased ROM  Increased strength  Both right and left upper extremity  With Supervision  With minimal assist  Vinie Charity OT, MOT

## 2021-02-22 NOTE — Evaluation (Signed)
Physical Therapy Evaluation Patient Details Name: [name] MRN: [id number] DOB: [birth year] Today's Date: 02/22/2021   History of Present Illness  [name]  is a 87 y.o. male, past medical history of diabetes mellitus, insulin-dependent, dementia, GERD, Parkinson disease, hypertension, patient was brought to ED by his wife secondary to ental status, she reports patient with worsening agitation, confusion, progressive over the past week, as well patient with episodes of hypoglycemia, and fall where EMS assessed him for couple times, but patient refused evaluation, patient had wounds to the left buttocks, and left foot, wife report usually patient using walker and wheelchair, but he is with increased falls recently.  -In ED his work-up was significant for creatinine of 1.48, platelet of 114 K, hemoglobin of 10.7, urinalysis is positive with significant pyuria and positive leukocyte esterase, his CT head with stable less than 3 mm chronic bilateral subdural hematomas or dural thickening, with no acute hemorrhage.  [facility] hospitalist consulted to admit.   Clinical Impression  Patient limited for functional mobility as stated below secondary to BLE weakness, fatigue and poor standing balance. Patient requires min assist and frequent cueing to transition to seated EOB. He demonstrates fair sitting balance and good sitting tolerance EOB. He requires min/mod assist to transfer to standing with RW secondary to impaired LE strength. He ambulates in room with unsteady cadence with RW and intermittent cueing for sequencing. He requires 1 seated rest break due to fatigue and impaired activity tolerance. Patient returned to bed at end of session with NT present. Patient will benefit from continued physical therapy in hospital and recommended venue below to increase strength, balance, endurance for safe ADLs and gait.     Follow Up Recommendations SNF    Equipment Recommendations  None recommended by PT     Recommendations for Other Services       Precautions / Restrictions Precautions Precautions: Fall Restrictions Weight Bearing Restrictions: No      Mobility  Bed Mobility Overal bed mobility: Needs Assistance Bed Mobility: Supine to Sit;Sit to Supine     Supine to sit: Min assist Sit to supine: Min assist   General bed mobility comments: slow labored movement    Transfers Overall transfer level: Needs assistance Equipment used: Rolling walker (2 wheeled) Transfers: Sit to/from [employer] Sit to Stand: Min assist;Mod assist Stand pivot transfers: Mod assist       General transfer comment: requires min/ mod assist to transfer with RW, intermittent cueing for sequencing  Ambulation/Gait Ambulation/Gait assistance: Min assist;Mod assist Gait Distance (Feet): 10 Feet Assistive device: Rolling walker (2 wheeled) Gait Pattern/deviations: Shuffle Gait velocity: decreased   General Gait Details: labored, unsteady cadence requiring 1 seated rest break due to fatigue  Stairs            Wheelchair Mobility    Modified Rankin (Stroke Patients Only)       Balance Overall balance assessment: Needs assistance Sitting-balance support: Feet supported;No upper extremity supported Sitting balance-Leahy Scale: Fair (fair to good) Sitting balance - Comments: EOB   Standing balance support: Bilateral upper extremity supported;During functional activity Standing balance-Leahy Scale: Poor Standing balance comment: using RW                             Pertinent Vitals/Pain Pain Assessment: Faces Faces Pain Scale: Hurts little more Pain Location: buttock area Pain Descriptors / Indicators: Guarding Pain Intervention(s): Limited activity within patient's tolerance;Repositioned    Home Living Family/patient  expects to be discharged to:: Skilled nursing facility (Pt poor historian with expressive difficulties.)                  Additional Comments: Per document review pt lives with wife who states she can no longer take care of him. Pt reportedly uses a w/c and walker for mobility per document review.    Prior Function Level of Independence: Needs assistance   Gait / Transfers Assistance Needed: assist from wife per document review; pt uses RW and w/c  ADL's / Homemaking Assistance Needed: assist from wife per document review        Hand Dominance        Extremity/Trunk Assessment   Upper Extremity Assessment Upper Extremity Assessment: Defer to OT evaluation    Lower Extremity Assessment Lower Extremity Assessment: Generalized weakness    Cervical / Trunk Assessment Cervical / Trunk Assessment: Kyphotic  Communication   Communication: Expressive difficulties  Cognition Arousal/Alertness: Awake/alert Behavior During Therapy: WFL for tasks assessed/performed Overall Cognitive Status: History of cognitive impairments - at baseline                                        General Comments      Exercises     Assessment/Plan    PT Assessment Patient needs continued PT services  PT Problem List Decreased strength;Decreased mobility;Decreased activity tolerance;Decreased balance;Decreased knowledge of use of DME;Decreased cognition;Pain       PT Treatment Interventions DME instruction;Therapeutic exercise;Gait training;Balance training;Stair training;Neuromuscular re-education;Functional mobility training;Therapeutic activities;Patient/family education    PT Goals (Current goals can be found in the Care Plan section)  Acute Rehab PT Goals Patient Stated Goal: pt unable to participate    Frequency Min 3X/week   Barriers to discharge        Co-evaluation               AM-PAC PT "6 Clicks" Mobility  Outcome Measure Help needed turning from your back to your side while in a flat bed without using bedrails?: None Help needed moving from lying on your back to sitting  on the side of a flat bed without using bedrails?: A Little Help needed moving to and from a bed to a chair (including a wheelchair)?: A Lot Help needed standing up from a chair using your arms (e.g., wheelchair or bedside chair)?: A Little Help needed to walk in hospital room?: A Lot Help needed climbing 3-5 steps with a railing? : A Lot 6 Click Score: 16    End of Session Equipment Utilized During Treatment: Gait belt Activity Tolerance: Patient tolerated treatment well;Patient limited by fatigue Patient left: in bed;with call bell/phone within reach;with bed alarm set;with nursing/sitter in room Nurse Communication: Mobility status PT Visit Diagnosis: Unsteadiness on feet (R26.81);Other abnormalities of gait and mobility (R26.89);Muscle weakness (generalized) (M62.81);History of falling (Z91.81)    Time: 5852-7782 PT Time Calculation (min) (ACUTE ONLY): 12 min   Charges:   PT Evaluation $PT Eval Low Complexity: 1 Low          10:23 AM, 02/22/21 Wyman Songster PT, DPT Physical Therapist at [facility]

## 2021-02-23 DIAGNOSIS — N171 Acute kidney failure with acute cortical necrosis: Secondary | ICD-10-CM | POA: Diagnosis not present

## 2021-02-23 DIAGNOSIS — Z20822 Contact with and (suspected) exposure to covid-19: Secondary | ICD-10-CM | POA: Diagnosis present

## 2021-02-23 DIAGNOSIS — E440 Moderate protein-calorie malnutrition: Secondary | ICD-10-CM | POA: Diagnosis present

## 2021-02-23 DIAGNOSIS — Z7189 Other specified counseling: Secondary | ICD-10-CM | POA: Diagnosis not present

## 2021-02-23 DIAGNOSIS — L89322 Pressure ulcer of left buttock, stage 2: Secondary | ICD-10-CM | POA: Diagnosis present

## 2021-02-23 DIAGNOSIS — K219 Gastro-esophageal reflux disease without esophagitis: Secondary | ICD-10-CM | POA: Diagnosis present

## 2021-02-23 DIAGNOSIS — F0281 Dementia in other diseases classified elsewhere with behavioral disturbance: Secondary | ICD-10-CM | POA: Diagnosis present

## 2021-02-23 DIAGNOSIS — I6203 Nontraumatic chronic subdural hemorrhage: Secondary | ICD-10-CM | POA: Diagnosis present

## 2021-02-23 DIAGNOSIS — Z87891 Personal history of nicotine dependence: Secondary | ICD-10-CM | POA: Diagnosis not present

## 2021-02-23 DIAGNOSIS — R4182 Altered mental status, unspecified: Secondary | ICD-10-CM | POA: Diagnosis present

## 2021-02-23 DIAGNOSIS — G9340 Encephalopathy, unspecified: Secondary | ICD-10-CM | POA: Diagnosis present

## 2021-02-23 DIAGNOSIS — Z794 Long term (current) use of insulin: Secondary | ICD-10-CM | POA: Diagnosis not present

## 2021-02-23 DIAGNOSIS — E11649 Type 2 diabetes mellitus with hypoglycemia without coma: Secondary | ICD-10-CM | POA: Diagnosis present

## 2021-02-23 DIAGNOSIS — E1169 Type 2 diabetes mellitus with other specified complication: Secondary | ICD-10-CM | POA: Diagnosis not present

## 2021-02-23 DIAGNOSIS — G20 Parkinson's disease: Secondary | ICD-10-CM | POA: Diagnosis present

## 2021-02-23 DIAGNOSIS — N1831 Chronic kidney disease, stage 3a: Secondary | ICD-10-CM | POA: Diagnosis present

## 2021-02-23 DIAGNOSIS — Z6826 Body mass index (BMI) 26.0-26.9, adult: Secondary | ICD-10-CM | POA: Diagnosis not present

## 2021-02-23 DIAGNOSIS — N179 Acute kidney failure, unspecified: Secondary | ICD-10-CM | POA: Diagnosis present

## 2021-02-23 DIAGNOSIS — I129 Hypertensive chronic kidney disease with stage 1 through stage 4 chronic kidney disease, or unspecified chronic kidney disease: Secondary | ICD-10-CM | POA: Diagnosis present

## 2021-02-23 DIAGNOSIS — R627 Adult failure to thrive: Secondary | ICD-10-CM | POA: Diagnosis present

## 2021-02-23 DIAGNOSIS — L89312 Pressure ulcer of right buttock, stage 2: Secondary | ICD-10-CM | POA: Diagnosis present

## 2021-02-23 DIAGNOSIS — N183 Chronic kidney disease, stage 3 unspecified: Secondary | ICD-10-CM | POA: Diagnosis present

## 2021-02-23 DIAGNOSIS — G9341 Metabolic encephalopathy: Secondary | ICD-10-CM | POA: Diagnosis present

## 2021-02-23 DIAGNOSIS — Z515 Encounter for palliative care: Secondary | ICD-10-CM | POA: Diagnosis not present

## 2021-02-23 DIAGNOSIS — H9190 Unspecified hearing loss, unspecified ear: Secondary | ICD-10-CM | POA: Diagnosis present

## 2021-02-23 DIAGNOSIS — E7800 Pure hypercholesterolemia, unspecified: Secondary | ICD-10-CM | POA: Diagnosis present

## 2021-02-23 DIAGNOSIS — N390 Urinary tract infection, site not specified: Secondary | ICD-10-CM | POA: Diagnosis present

## 2021-02-23 DIAGNOSIS — E1122 Type 2 diabetes mellitus with diabetic chronic kidney disease: Secondary | ICD-10-CM | POA: Diagnosis present

## 2021-02-23 DIAGNOSIS — E1151 Type 2 diabetes mellitus with diabetic peripheral angiopathy without gangrene: Secondary | ICD-10-CM | POA: Diagnosis present

## 2021-02-23 DIAGNOSIS — L8990 Pressure ulcer of unspecified site, unspecified stage: Secondary | ICD-10-CM | POA: Insufficient documentation

## 2021-02-23 DIAGNOSIS — Z66 Do not resuscitate: Secondary | ICD-10-CM | POA: Diagnosis present

## 2021-02-23 LAB — GLUCOSE, CAPILLARY
Glucose-Capillary: 102 mg/dL — ABNORMAL HIGH (ref 70–99)
Glucose-Capillary: 120 mg/dL — ABNORMAL HIGH (ref 70–99)
Glucose-Capillary: 112 mg/dL — ABNORMAL HIGH (ref 70–99)
Glucose-Capillary: 149 mg/dL — ABNORMAL HIGH (ref 70–99)

## 2021-02-23 MED ORDER — ALPRAZOLAM 0.5 MG PO TABS: 0.5000 mg | ORAL_TABLET | Freq: Three times a day (TID) | ORAL | Status: DC | PRN

## 2021-02-23 MED ORDER — QUETIAPINE FUMARATE 25 MG PO TABS: 25.0000 mg | ORAL_TABLET | Freq: Every day | ORAL | Status: DC

## 2021-02-23 NOTE — Progress Notes (Signed)
PROGRESS NOTE    Patient: [name]                            PCP: [name], FNP                    DOB: October 26, 1929            DOA: 02/21/2021 ZOX:[id number]             DOS: 02/23/2021, 10:44 AM   LOS: 0 days   Date of Service: The patient was seen and examined on 02/23/2021  Subjective:   The patient was seen and examined this morning, pleasantly confused, sleepy Mumbles speech is not clear unable to communicate clearly Extremities in bed Sitter at bedside  Brief Narrative:   Tyreke Kaeser  is a 87 y.o. male, past medical history of diabetes mellitus, insulin-dependent, dementia, GERD, Parkinson disease, hypertension, patient was brought to ED by his wife secondary to change in his mental status. She reports that the patient's agitation confusion is progressively getting worse especially over past week..  Patient also has had episodes of hypoglycemia and falls.  patient had wounds to the left buttocks, and left foot, wife report usually patient using walker and wheelchair, but he is with increased falls recently. ED his work-up was significant for creatinine of 1.48, platelet of 114 K, hemoglobin of 10.7, urinalysis is positive with significant pyuria and positive leukocyte esterase, his CT head with stable less than 3 mm chronic bilateral subdural hematomas or dural thickening, with no acute hemorrhage.     Assessment & Plan:   Active Problems:   Acute renal failure (HCC)   Diabetes (HCC)   Failure to thrive in adult   Dementia due to Parkinson's disease with behavioral disturbance (HCC)   Parkinson disease (HCC)   Dementia associated with other underlying disease with behavioral disturbance (HCC)   Acute lower UTI   Pressure injury of skin      Failure to thrive/with encephalopathy -Progressing to severe debility, worsening mentation (poor interaction, poor p.o. intake) No major changes... Hemodynamically stable -Encephalopathy likely metabolic worsening  by UTI-and underlying Parkinson dementia -Continue to treat underlying cause of infection UTI, -Encourage p.o. intake, -We will consult dietitian -Started PT OT, patient not ambulating, recommending SNF placement  Acute metabolic encephalopathy No major changes remains cephalopathic -Complicated by UTI, progressive Parkinson dementia, failure to thrive Poor p.o. intake leading to dehydration, malnutrition -CT of the head revealing old subdural hematoma likely not contributing Ammonia level normal      UTI -We will follow with urine culture -Continue currently started antibiotics of IV Rocephin -Afebrile normotensive   Parkinson disease-Parkinson dementia -We will continue with home medications include Sinemet -It seems that he is progressively declining with debility, poor mentation, No visible tremors at this time   Dementia-severe advanced -Continue with home medication include Depakote, Aricept and Namenda -We will hold Seroquel given altered mental status   Diabetes mellitus II  -Patient is on Lantus at home, resumed at lower dose due to poor p.o. intake -Checking CBG QA CHS, SSI coverage -will hold semaglutide   Hypertension  - continue with home medications -Stable   Hyperlipidemia - continue with statin -Stable   GERD - on PPI -stable no changes   CKD stage III  -Monitoring BUN/creatinine closely, avoiding nephrotoxins, -Creatinine at baseline   PVD -continue with cilostazol and statin -Stable   Subdural hematoma  -CT head showing  stable less than 3 mm chronic bilateral subdural hematomas or dural thickening. No acute hemorrhage.  -Continue to monitor      Skin Assessment: I have examined the patient's skin and I agree with the wound assessment as performed by wound care team As outlined belowe: Pressure Injury 02/21/21 Buttocks Left;Medial Stage 2 -  Partial thickness loss of dermis presenting as a shallow open injury with a red, pink wound bed  without slough. nickel sized, pink wound base, no drainage noted (Active)  02/21/21 1845  Location: Buttocks  Location Orientation: Left;Medial  Staging: Stage 2 -  Partial thickness loss of dermis presenting as a shallow open injury with a red, pink wound bed without slough.  Wound Description (Comments): nickel sized, pink wound base, no drainage noted  Present on Admission: Yes     Pressure Injury 02/21/21 Buttocks Right;Medial Stage 2 -  Partial thickness loss of dermis presenting as a shallow open injury with a red, pink wound bed without slough. pencil eraser sized, pink base, no drainage (Active)  02/21/21 1845  Location: Buttocks  Location Orientation: Right;Medial  Staging: Stage 2 -  Partial thickness loss of dermis presenting as a shallow open injury with a red, pink wound bed without slough.  Wound Description (Comments): pencil eraser sized, pink base, no drainage  Present on Admission: Yes   Ethics: Due to multiple comorbidities status decline, physically and mentally-prognosis remains poor, CODE STATUS remains DNR/DNI -currently we are recommending comfort care measures Possible hospice Palliative care consulted to determine goal of care  Severe debility -patient is now ambulating, status post evaluation by PT OT, recommending SNF  ------------------------------------------------------------------------------------------------------------------------------------------------- Cultures; Urine Culture  >>> NGT    Antimicrobials: IV Rocephin   Consultants: PT/OT   ------------------------------------------------------------------------------------------------------------------------------------------------  DVT prophylaxis:  SCD/Compression stockings and Heparin SQ Code Status:   Code Status: DNR Code Status DNR confirmed by wife  Family Communication: No family member present at bedside- attempt will be made to update his wife daily The above findings and plan of  care has been discussed with patient (and family)  in detail,  they expressed understanding and agreement of above. -Advance care planning has been discussed.   Admission status:   Status is: Inpt   The patient will require care spanning > 2 midnights and should be moved to inpatient because: Inpatient level of care appropriate due to severity of illness  Dispo: The patient is from: Home              Anticipated d/c is to: SNF in 2-3 days               Patient currently is not medically stable to d/c.   Difficult to place patient No      Level of care: Telemetry   Procedures:   No admission procedures for hospital encounter.    Antimicrobials:  Anti-infectives (From admission, onward)    Start     Dose/Rate Route Frequency Ordered Stop   02/21/21 2130  cefTRIAXone (ROCEPHIN) 1 g in sodium chloride 0.9 % 100 mL IVPB        1 g 200 mL/hr over 30 Minutes Intravenous Every 24 hours 02/21/21 2038          Medication:   amLODipine  2.5 mg Oral q morning   aspirin EC  81 mg Oral Daily   atorvastatin  40 mg Oral Daily   carbidopa-levodopa  1 tablet Oral TID   cilostazol  100 mg Oral Daily   divalproex  750 mg Oral Daily   donepezil  10 mg Oral QHS   gabapentin  100 mg Oral TID   heparin  5,000 Units Subcutaneous Q8H   insulin aspart  0-5 Units Subcutaneous QHS   insulin aspart  0-9 Units Subcutaneous TID WC   lisinopril  20 mg Oral Daily   memantine  5 mg Oral BID   pantoprazole  40 mg Oral Daily   QUEtiapine  25 mg Oral QHS   vitamin B-12  100 mcg Oral q morning       Objective:   Vitals:   02/22/21 0505 02/22/21 1322 02/22/21 1954 02/23/21 0423  BP: 132/70 127/66 123/64 132/73  Pulse: 82 (!) 101 93 100  Resp: 16 18 18 15   Temp: 98.4 F (36.9 C)  97.8 F (36.6 C) 98.6 F (37 C)  TempSrc:   Oral   SpO2: 96% 99% 97% 100%  Weight:      Height:        Intake/Output Summary (Last 24 hours) at 02/23/2021 1044 Last data filed at 02/23/2021 0636 Gross  per 24 hour  Intake 420 ml  Output 1100 ml  Net -680 ml   Filed Weights   02/21/21 1331  Weight: 83 kg     Examination:     Physical Exam:   General:  Pleasantly confused,   HEENT:  Normocephalic, PERRL, otherwise with in Normal limits   Neuro:  CNII-XII intact. , normal motor and sensation, reflexes intact   Lungs:   Clear to auscultation BL, Respirations unlabored, no wheezes / crackles  Cardio:    S1/S2, RRR, No murmure, No Rubs or Gallops   Abdomen:   Soft, non-tender, bowel sounds active all four quadrants,  no guarding or peritoneal signs.  Muscular skeletal:  Severe global weakness -currently bedbound  Limited exam - in bed, able to move all 4 extremities, Normal strength,  2+ pulses,  symmetric,   Skin:  Dry, warm to touch, negative for any Rashes,  Wounds: Please see nursing documentation  Pressure Injury 02/21/21 Buttocks Left;Medial Stage 2 -  Partial thickness loss of dermis presenting as a shallow open injury with a red, pink wound bed without slough. nickel sized, pink wound base, no drainage noted (Active)  02/21/21 1845  Location: Buttocks  Location Orientation: Left;Medial  Staging: Stage 2 -  Partial thickness loss of dermis presenting as a shallow open injury with a red, pink wound bed without slough.  Wound Description (Comments): nickel sized, pink wound base, no drainage noted  Present on Admission: Yes     Pressure Injury 02/21/21 Buttocks Right;Medial Stage 2 -  Partial thickness loss of dermis presenting as a shallow open injury with a red, pink wound bed without slough. pencil eraser sized, pink base, no drainage (Active)  02/21/21 1845  Location: Buttocks  Location Orientation: Right;Medial  Staging: Stage 2 -  Partial thickness loss of dermis presenting as a shallow open injury with a red, pink wound bed without slough.  Wound Description (Comments): pencil eraser sized, pink base, no drainage  Present on Admission: Yes         ------------------------------------------------------------------------------------------------------------------------------------------    LABs:  CBC Latest Ref Rng & Units 02/22/2021 02/21/2021 01/30/2021  WBC 4.0 - 10.5 K/uL 6.0 5.2 7.2  Hemoglobin 13.0 - 17.0 g/dL 11.6(L) 10.7(L) 10.3(L)  Hematocrit 39.0 - 52.0 % 36.3(L) 33.2(L) 32.0(L)  Platelets 150 - 400 K/uL 112(L) 114(L) 125(L)   CMP Latest Ref Rng & Units 02/22/2021 02/21/2021 01/30/2021  Glucose  70 - 99 mg/dL 841(Y112(H) 606(T167(H) 79  BUN 8 - 23 mg/dL 16 20 16   Creatinine 0.61 - 1.24 mg/dL 0.161.20 0.10(X1.48(H) 3.231.24  Sodium 135 - 145 mmol/L 138 137 136  Potassium 3.5 - 5.1 mmol/L 4.2 4.3 3.6  Chloride 98 - 111 mmol/L 107 105 105  CO2 22 - 32 mmol/L 23 24 21(L)  Calcium 8.9 - 10.3 mg/dL 9.0 5.5(D8.8(L) 3.2(K8.6(L)  Total Protein 6.5 - 8.1 g/dL - 7.0 -  Total Bilirubin 0.3 - 1.2 mg/dL - 0.6 -  Alkaline Phos 38 - 126 U/L - 68 -  AST 15 - 41 U/L - 15 -  ALT 0 - 44 U/L - 8 -       Micro Results Recent Results (from the past 240 hour(s))  Urine Culture     Status: None (Preliminary result)   Collection Time: 02/21/21  1:55 PM   Specimen: In/Out Cath Urine  Result Value Ref Range Status   Specimen Description   Final    IN/OUT CATH URINE Performed at [facility], [address] [place], [place] [address]    Special Requests   Final    NONE Performed at [facility], [address] [place], [place] [address]    Culture   Final    CULTURE REINCUBATED FOR BETTER GROWTH Performed at [facility], [address] [place], [place] [address]    Report Status PENDING  Incomplete  Resp Panel by RT-PCR (Flu A&B, Covid) Nasopharyngeal Swab     Status: None   Collection Time: 02/21/21  1:56 PM   Specimen: Nasopharyngeal Swab; Nasopharyngeal(NP) swabs in vial transport medium  Result Value Ref Range Status   SARS Coronavirus 2 by RT PCR NEGATIVE NEGATIVE Final    Comment: (NOTE) SARS-CoV-2 target nucleic acids are NOT DETECTED.  The  SARS-CoV-2 RNA is generally detectable in upper respiratory specimens during the acute phase of infection. The lowest concentration of SARS-CoV-2 viral copies this assay can detect is 138 copies/mL. A negative result does not preclude SARS-Cov-2 infection and should not be used as the sole basis for treatment or other patient management decisions. A negative result may occur with  improper specimen collection/handling, submission of specimen other than nasopharyngeal swab, presence of viral mutation(s) within the areas targeted by this assay, and inadequate number of viral copies(<138 copies/mL). A negative result must be combined with clinical observations, patient history, and epidemiological information. The expected result is Negative.  Fact Sheet for Patients:  BloggerCourse.comhttps://www.fda.gov/media/152166/download  Fact Sheet for Healthcare Providers:  SeriousBroker.ithttps://www.fda.gov/media/152162/download  This test is no t yet approved or cleared by the [place] FDA and  has been authorized for detection and/or diagnosis of SARS-CoV-2 by FDA under an Emergency Use Authorization (EUA). This EUA will remain  in effect (meaning this test can be used) for the duration of the COVID-19 declaration under Section 564(b)(1) of the Act, 21 U.S.C.section 360bbb-3(b)(1), unless the authorization is terminated  or revoked sooner.       Influenza A by PCR NEGATIVE NEGATIVE Final   Influenza B by PCR NEGATIVE NEGATIVE Final    Comment: (NOTE) The Xpert Xpress SARS-CoV-2/FLU/RSV plus assay is intended as an aid in the diagnosis of influenza from Nasopharyngeal swab specimens and should not be used as a sole basis for treatment. Nasal washings and aspirates are unacceptable for Xpert Xpress SARS-CoV-2/FLU/RSV testing.  Fact Sheet for Patients: BloggerCourse.comhttps://www.fda.gov/media/152166/download  Fact Sheet for Healthcare Providers: SeriousBroker.ithttps://www.fda.gov/media/152162/download  This test is not yet approved or  cleared  by the [place] and has been authorized for detection and/or diagnosis of SARS-CoV-2 by FDA under an Emergency Use Authorization (EUA). This EUA will remain in effect (meaning this test can be used) for the duration of the COVID-19 declaration under Section 564(b)(1) of the Act, 21 U.S.C. section 360bbb-3(b)(1), unless the authorization is terminated or revoked.  Performed at [facility], [address] [place], [place] [address]     Radiology Reports CT Head Wo Contrast  Result Date: 02/21/2021 CLINICAL DATA:  Dementia, Parkinson's disease, hypoglycemia, altered level of consciousness EXAM: CT HEAD WITHOUT CONTRAST TECHNIQUE: Contiguous axial images were obtained from the base of the skull through the vertex without intravenous contrast. COMPARISON:  06/05/2020 FINDINGS: Brain: No acute infarct or hemorrhage. Chronic small vessel ischemic changes are seen within the periventricular white matter. Lateral ventricles and midline structures are unremarkable. Stable bilateral less than 3 mm chronic subdural hematomas are again noted along the convexities. No acute extra-axial fluid collections. No mass effect. Vascular: No hyperdense vessel or unexpected calcification. Skull: Normal. Negative for fracture or focal lesion. Sinuses/Orbits: Polypoid mucosal thickening left sphenoid sinus. Remaining sinuses are clear. Chronic left mastoid effusion. Other: None. IMPRESSION: 1. Stable less than 3 mm chronic bilateral subdural hematomas or dural thickening. No acute hemorrhage. 2. Chronic small vessel ischemic changes.  No acute infarct. Electronically Signed   By: Sharlet Salina M.D.   On: 02/21/2021 15:56   DG Chest Portable 1 View  Result Date: 02/21/2021 CLINICAL DATA:  Altered. EXAM: PORTABLE CHEST 1 VIEW COMPARISON:  Radiograph 12/31/2020 FINDINGS: Low lung volumes. Stable heart size and mediastinal contours for technique. Aortic atherosclerosis. Streaky retrocardiac atelectasis. No  confluent airspace disease. No pulmonary edema. No pleural effusion or pneumothorax. No acute osseous abnormalities are seen. IMPRESSION: Low lung volumes with retrocardiac atelectasis. Electronically Signed   By: Narda Rutherford M.D.   On: 02/21/2021 17:15    SIGNED: Kendell Bane, MD, FHM. [facility],  Pager (please use [email or web address] to page/text) Please use Epic Secure Chat for non-urgent communication (7AM-7PM)  If 7PM-7AM, please contact night-coverage [email or web address], 02/23/2021, 10:44 AM

## 2021-02-23 NOTE — TOC Progression Note (Signed)
Transition of Care [facility]) - Progression Note    Patient Details  Name: [name] MRN: [id number] Date of Birth: [birth year]  Transition of Care [facility]) CM/SW Contact  Barry Brunner, LCSW Phone Number: 02/23/2021, 2:08 PM  Clinical Narrative:    Patient required Sitter. Bed offers pending time 24 hours without sitter. TOC to follow   Expected Discharge Plan: Skilled Nursing Facility Barriers to Discharge: Barriers Resolved  Expected Discharge Plan and Services Expected Discharge Plan: Skilled Nursing Facility       Living arrangements for the past 2 months: Single Family Home                                       Social Determinants of Health (SDOH) Interventions    Readmission Risk Interventions No flowsheet data found.

## 2021-02-24 ENCOUNTER — Inpatient Hospital Stay (HOSPITAL_COMMUNITY): Payer: Medicare Other

## 2021-02-24 DIAGNOSIS — E1169 Type 2 diabetes mellitus with other specified complication: Secondary | ICD-10-CM | POA: Diagnosis not present

## 2021-02-24 DIAGNOSIS — G20 Parkinson's disease: Secondary | ICD-10-CM | POA: Diagnosis not present

## 2021-02-24 DIAGNOSIS — N390 Urinary tract infection, site not specified: Secondary | ICD-10-CM | POA: Diagnosis not present

## 2021-02-24 DIAGNOSIS — L89300 Pressure ulcer of unspecified buttock, unstageable: Secondary | ICD-10-CM

## 2021-02-24 DIAGNOSIS — N179 Acute kidney failure, unspecified: Secondary | ICD-10-CM | POA: Diagnosis not present

## 2021-02-24 LAB — BASIC METABOLIC PANEL
Anion gap: 9 (ref 5–15)
BUN: 19 mg/dL (ref 8–23)
CO2: 24 mmol/L (ref 22–32)
Calcium: 9.1 mg/dL (ref 8.9–10.3)
Chloride: 104 mmol/L (ref 98–111)
Creatinine, Ser: 1.38 mg/dL — ABNORMAL HIGH (ref 0.61–1.24)
GFR, Estimated: 49 mL/min — ABNORMAL LOW (ref >60)
Glucose, Bld: 115 mg/dL — ABNORMAL HIGH (ref 70–99)
Potassium: 4.2 mmol/L (ref 3.5–5.1)
Sodium: 137 mmol/L (ref 135–145)

## 2021-02-24 LAB — URINE CULTURE: Culture: >=100000 — AB

## 2021-02-24 LAB — CBC
HCT: 38.8 % — ABNORMAL LOW (ref 39.0–52.0)
Hemoglobin: 12.2 g/dL — ABNORMAL LOW (ref 13.0–17.0)
MCH: 27.7 pg (ref 26.0–34.0)
MCHC: 31.4 g/dL (ref 30.0–36.0)
MCV: 88.2 fL (ref 80.0–100.0)
Platelets: 123 10*3/uL — ABNORMAL LOW (ref 150–400)
RBC: 4.4 MIL/uL (ref 4.22–5.81)
RDW: 14.2 % (ref 11.5–15.5)
WBC: 6.1 10*3/uL (ref 4.0–10.5)
nRBC: 0 % (ref 0.0–0.2)

## 2021-02-24 LAB — GLUCOSE, CAPILLARY
Glucose-Capillary: 99 mg/dL (ref 70–99)
Glucose-Capillary: 142 mg/dL — ABNORMAL HIGH (ref 70–99)
Glucose-Capillary: 172 mg/dL — ABNORMAL HIGH (ref 70–99)
Glucose-Capillary: 188 mg/dL — ABNORMAL HIGH (ref 70–99)

## 2021-02-24 MED ORDER — DIVALPROEX SODIUM 250 MG PO DR TAB: 250.0000 mg | DELAYED_RELEASE_TABLET | Freq: Every day | ORAL | Status: DC

## 2021-02-24 MED ORDER — SODIUM CHLORIDE 0.9 % IV SOLN: INTRAVENOUS | Status: AC

## 2021-02-24 MED ORDER — ALPRAZOLAM 1 MG PO TABS
1.0000 mg | ORAL_TABLET | Freq: Three times a day (TID) | ORAL | Status: DC | PRN
  Administered 2021-02-25 – 2021-02-27 (×3): 1 mg via ORAL
  Filled 2021-02-24 (×3): qty 1

## 2021-02-24 MED ORDER — DIVALPROEX SODIUM 250 MG PO DR TAB
500.0000 mg | DELAYED_RELEASE_TABLET | Freq: Every day | ORAL | Status: DC
  Administered 2021-02-24 – 2021-02-26 (×3): 500 mg via ORAL
  Filled 2021-02-24 (×3): qty 2

## 2021-02-24 NOTE — TOC Progression Note (Signed)
Transition of Care [facility]) - Progression Note    Patient Details  Name: [name] MRN: [id number] Date of Birth: 09-Sep-1929  Transition of Care [facility]) CM/SW Contact  Barry Brunner, LCSW Phone Number: 02/24/2021, 1:52 PM  Clinical Narrative:    CSW started [place]. TOC to follow.   Expected Discharge Plan: Skilled Nursing Facility Barriers to Discharge: Barriers Resolved  Expected Discharge Plan and Services Expected Discharge Plan: Skilled Nursing Facility       Living arrangements for the past 2 months: Single Family Home                                       Social Determinants of Health (SDOH) Interventions    Readmission Risk Interventions No flowsheet data found.

## 2021-02-24 NOTE — Progress Notes (Signed)
PROGRESS NOTE    Patient: [name]                            PCP: [name], FNP                    DOB: 01-29-1930            DOA: 02/21/2021 NWG:[id number]             DOS: 02/24/2021, 10:30 AM   LOS: 1 day   Date of Service: The patient was seen and examined on 02/24/2021  Subjective:   The patient was seen and examined this morning, lethargic, sleepy, confused no agitation Mildly hypertensive, BP meds were held Pursuing with CT of the head and EEG    Brief Narrative:   Apolonio Cutting  is a 87 y.o. male, past medical history of diabetes mellitus, insulin-dependent, dementia, GERD, Parkinson disease, hypertension, patient was brought to ED by his wife secondary to change in his mental status. She reports that the patient's agitation confusion is progressively getting worse especially over past week..  Patient also has had episodes of hypoglycemia and falls.  patient had wounds to the left buttocks, and left foot, wife report usually patient using walker and wheelchair, but he is with increased falls recently. ED his work-up was significant for creatinine of 1.48, platelet of 114 K, hemoglobin of 10.7, urinalysis is positive with significant pyuria and positive leukocyte esterase, his CT head with stable less than 3 mm chronic bilateral subdural hematomas or dural thickening, with no acute hemorrhage.     Assessment & Plan:   Active Problems:   Acute renal failure (HCC)   Diabetes (HCC)   Failure to thrive in adult   Dementia due to Parkinson's disease with behavioral disturbance (HCC)   Parkinson disease (HCC)   Dementia associated with other underlying disease with behavioral disturbance (HCC)   Acute lower UTI   Pressure injury of skin   Encephalopathy      Failure to thrive/with encephalopathy -Progressing to severe debility, worsening mentation (poor interaction, poor p.o. intake)   -Encephalopathy likely metabolic worsening by UTI-and underlying  Parkinson dementia -Continue to treat underlying cause of infection UTI, -Encourage p.o. intake, -We will consult dietitian -Started PT OT, patient not ambulating, recommending SNF placement  Acute metabolic encephalopathy No major changes, remaining lethargic, encephalopathic -CT of the head was repeated today 02/14/2021 reported stable nonacute bilateral subdural hematoma without cerebral mass affect -Repeating EEG -Cutting down his Keppra from 750 to 500 mg PO daily-Complicated by UTI, progressive Parkinson dementia, failure to thrive Poor p.o. intake leading to dehydration, malnutrition       UTI -We will follow with urine culture -Continue currently started antibiotics of IV Rocephin -Afebrile normotensive   Parkinson disease-Parkinson dementia -We will continue with home medications include Sinemet -It seems that he is progressively declining with debility, poor mentation, No visible tremors at this time   Dementia-severe advanced -Continue with home medication include Depakote, Aricept and Namenda -We will continue to hold Seroquel given altered mental status -Reducing Depakote, anticipating holding Aricept and Namenda   Diabetes mellitus II  -Patient is on Lantus at home, resumed at lower dose due to poor p.o. intake -Checking CBG QA CHS, SSI coverage -will hold semaglutide   Hypertension --hypotensive -Discontinued home medication of Norvasc and lisinopril -Continuing IV fluid   Hyperlipidemia -Discontinuing statins as the risk outweighs the benefit at his age  and continued decline   GERD - on PPI -stable no changes   CKD stage III  -Monitoring BUN/creatinine closely, avoiding nephrotoxins, -Creatinine at baseline   PVD -continue with cilostazol and statin -Stable   Subdural hematoma  -CT head showing stable less than 3 mm chronic bilateral subdural hematomas or dural thickening. No acute hemorrhage.  -Continue to monitor -Repeat CT head 02/24/2021  revealed bilateral unchanged subdural hematoma, no new changes      Skin Assessment: I have examined the patient's skin and I agree with the wound assessment as performed by wound care team As outlined belowe: Pressure Injury 02/21/21 Buttocks Left;Medial Stage 2 -  Partial thickness loss of dermis presenting as a shallow open injury with a red, pink wound bed without slough. nickel sized, pink wound base, no drainage noted (Active)  02/21/21 1845  Location: Buttocks  Location Orientation: Left;Medial  Staging: Stage 2 -  Partial thickness loss of dermis presenting as a shallow open injury with a red, pink wound bed without slough.  Wound Description (Comments): nickel sized, pink wound base, no drainage noted  Present on Admission: Yes     Pressure Injury 02/21/21 Buttocks Right;Medial Stage 2 -  Partial thickness loss of dermis presenting as a shallow open injury with a red, pink wound bed without slough. pencil eraser sized, pink base, no drainage (Active)  02/21/21 1845  Location: Buttocks  Location Orientation: Right;Medial  Staging: Stage 2 -  Partial thickness loss of dermis presenting as a shallow open injury with a red, pink wound bed without slough.  Wound Description (Comments): pencil eraser sized, pink base, no drainage  Present on Admission: Yes   Ethics: Due to multiple comorbidities status decline, physically and mentally-prognosis remains poor, CODE STATUS remains DNR/DNI -currently we are recommending comfort care measures Possible hospice Palliative care consulted to determine goal of care  Severe debility -patient is now ambulating, status post evaluation by PT OT, recommending SNF  ------------------------------------------------------------------------------------------------------------------------------------------------- Cultures; Urine Culture  >>> NGT    Antimicrobials: IV Rocephin   Consultants:  PT/OT   ------------------------------------------------------------------------------------------------------------------------------------------------  DVT prophylaxis:  SCD/Compression stockings and Heparin SQ Code Status:   Code Status: DNR Code Status DNR confirmed by wife  Family Communication: No family member present at bedside- attempt will be made to update his wife daily The above findings and plan of care has been discussed with patient (and family)  in detail,  they expressed understanding and agreement of above. -Advance care planning has been discussed.   Admission status:   Status is: Inpt   The patient will require care spanning > 2 midnights and should be moved to inpatient because: Inpatient level of care appropriate due to severity of illness  Dispo: The patient is from: Home              Anticipated d/c is to: SNF in 2-3 days               Patient currently is not medically stable to d/c.   Difficult to place patient No      Level of care: Telemetry   Procedures:   No admission procedures for hospital encounter.    Antimicrobials:  Anti-infectives (From admission, onward)    Start     Dose/Rate Route Frequency Ordered Stop   02/21/21 2130  cefTRIAXone (ROCEPHIN) 1 g in sodium chloride 0.9 % 100 mL IVPB        1 g 200 mL/hr over 30 Minutes Intravenous Every 24 hours 02/21/21 2038  Medication:   aspirin EC  81 mg Oral Daily   atorvastatin  40 mg Oral Daily   carbidopa-levodopa  1 tablet Oral TID   cilostazol  100 mg Oral Daily   divalproex  500 mg Oral Daily   donepezil  10 mg Oral QHS   gabapentin  100 mg Oral TID   heparin  5,000 Units Subcutaneous Q8H   insulin aspart  0-5 Units Subcutaneous QHS   insulin aspart  0-9 Units Subcutaneous TID WC   memantine  5 mg Oral BID   pantoprazole  40 mg Oral Daily   vitamin B-12  100 mcg Oral q morning       Objective:   Vitals:   02/23/21 1457 02/23/21 2053 02/24/21 0559  02/24/21 1010  BP: 126/71 104/66 (!) 121/58 (!) 89/56  Pulse: 89 93 78 97  Resp:  20 18 16   Temp:  98 F (36.7 C) 98 F (36.7 C) 97.6 F (36.4 C)  TempSrc:  Oral    SpO2:  91% 98% 99%  Weight:      Height:        Intake/Output Summary (Last 24 hours) at 02/24/2021 1030 Last data filed at 02/24/2021 0830 Gross per 24 hour  Intake 420 ml  Output 450 ml  Net -30 ml   Filed Weights   02/21/21 1331  Weight: 83 kg     Examination:      Physical Exam:   General:  Lethargic, difficult to arouse, very confused  HEENT:  Normocephalic, PERRL, otherwise with in Normal limits   Neuro:  CNII-XII intact. , normal motor and sensation, reflexes intact   Lungs:   Clear to auscultation BL, Respirations unlabored, no wheezes / crackles  Cardio:    S1/S2, RRR, No murmure, No Rubs or Gallops   Abdomen:   Soft, non-tender, bowel sounds active all four quadrants,  no guarding or peritoneal signs.  Muscular skeletal:  Severe global weakness Limited exam - in bed, able to move all 4 extremities,  2+ pulses,  symmetric, No pitting edema  Skin:  Dry, warm to touch, negative for any Rashes,  Wounds: Please see nursing documentation  Pressure Injury 02/21/21 Buttocks Left;Medial Stage 2 -  Partial thickness loss of dermis presenting as a shallow open injury with a red, pink wound bed without slough. nickel sized, pink wound base, no drainage noted (Active)  02/21/21 1845  Location: Buttocks  Location Orientation: Left;Medial  Staging: Stage 2 -  Partial thickness loss of dermis presenting as a shallow open injury with a red, pink wound bed without slough.  Wound Description (Comments): nickel sized, pink wound base, no drainage noted  Present on Admission: Yes     Pressure Injury 02/21/21 Buttocks Right;Medial Stage 2 -  Partial thickness loss of dermis presenting as a shallow open injury with a red, pink wound bed without slough. pencil eraser sized, pink base, no drainage (Active)  02/21/21  1845  Location: Buttocks  Location Orientation: Right;Medial  Staging: Stage 2 -  Partial thickness loss of dermis presenting as a shallow open injury with a red, pink wound bed without slough.  Wound Description (Comments): pencil eraser sized, pink base, no drainage  Present on Admission: Yes            ------------------------------------------------------------------------------------------------------------------------------------------    LABs:  CBC Latest Ref Rng & Units 02/24/2021 02/22/2021 02/21/2021  WBC 4.0 - 10.5 K/uL 6.1 6.0 5.2  Hemoglobin 13.0 - 17.0 g/dL 12.2(L) 11.6(L) 10.7(L)  Hematocrit 39.0 -  52.0 % 38.8(L) 36.3(L) 33.2(L)  Platelets 150 - 400 K/uL 123(L) 112(L) 114(L)   CMP Latest Ref Rng & Units 02/24/2021 02/22/2021 02/21/2021  Glucose 70 - 99 mg/dL 161(W) 960(A) 540(J)  BUN 8 - 23 mg/dL Creatinine 0.61 - 1.24 mg/dL 8.11(B) 1.47 8.29(F)  Sodium 135 - 145 mmol/L 137 138 137  Potassium 3.5 - 5.1 mmol/L 4.2 4.2 4.3  Chloride 98 - 111 mmol/L 104 107 105  CO2 22 - 32 mmol/L Calcium 8.9 - 10.3 mg/dL 9.1 9.0 6.2(Z)  Total Protein 6.5 - 8.1 g/dL - - 7.0  Total Bilirubin 0.3 - 1.2 mg/dL - - 0.6  Alkaline Phos 38 - 126 U/L - - 68  AST 15 - 41 U/L - - 15  ALT 0 - 44 U/L - - 8       Micro Results Recent Results (from the past 240 hour(s))  Urine Culture     Status: Abnormal   Collection Time: 02/21/21  1:55 PM   Specimen: In/Out Cath Urine  Result Value Ref Range Status   Specimen Description   Final    IN/OUT CATH URINE Performed at [facility], [address], [place], [place] [address]    Special Requests   Final    NONE Performed at [facility], [address], [place], [place] [address]    Culture >=100,000 COLONIES/mL STAPHYLOCOCCUS EPIDERMIDIS (A)  Final   Report Status 02/24/2021 FINAL  Final   Organism ID, Bacteria STAPHYLOCOCCUS EPIDERMIDIS (A)  Final      Susceptibility   Staphylococcus epidermidis - MIC*     CIPROFLOXACIN <=0.5 SENSITIVE Sensitive     GENTAMICIN <=0.5 SENSITIVE Sensitive     NITROFURANTOIN <=16 SENSITIVE Sensitive     OXACILLIN <=0.25 SENSITIVE Sensitive     TETRACYCLINE <=1 SENSITIVE Sensitive     VANCOMYCIN <=0.5 SENSITIVE Sensitive     TRIMETH/SULFA <=10 SENSITIVE Sensitive     CLINDAMYCIN <=0.25 SENSITIVE Sensitive     RIFAMPIN <=0.5 SENSITIVE Sensitive     Inducible Clindamycin NEGATIVE Sensitive     * >=100,000 COLONIES/mL STAPHYLOCOCCUS EPIDERMIDIS  Resp Panel by RT-PCR (Flu A&B, Covid) Nasopharyngeal Swab     Status: None   Collection Time: 02/21/21  1:56 PM   Specimen: Nasopharyngeal Swab; Nasopharyngeal(NP) swabs in vial transport medium  Result Value Ref Range Status   SARS Coronavirus 2 by RT PCR NEGATIVE NEGATIVE Final    Comment: (NOTE) SARS-CoV-2 target nucleic acids are NOT DETECTED.  The SARS-CoV-2 RNA is generally detectable in upper respiratory specimens during the acute phase of infection. The lowest concentration of SARS-CoV-2 viral copies this assay can detect is 138 copies/mL. A negative result does not preclude SARS-Cov-2 infection and should not be used as the sole basis for treatment or other patient management decisions. A negative result may occur with  improper specimen collection/handling, submission of specimen other than nasopharyngeal swab, presence of viral mutation(s) within the areas targeted by this assay, and inadequate number of viral copies(<138 copies/mL). A negative result must be combined with clinical observations, patient history, and epidemiological information. The expected result is Negative.  Fact Sheet for Patients:  BloggerCourse.com  Fact Sheet for Healthcare Providers:  SeriousBroker.it  This test is no t yet approved or cleared by the [place] FDA and  has been authorized for detection and/or diagnosis of SARS-CoV-2 by FDA under an Emergency Use  Authorization (EUA). This EUA will remain  in effect (meaning this test can  be used) for the duration of the COVID-19 declaration under Section 564(b)(1) of the Act, 21 U.S.C.section 360bbb-3(b)(1), unless the authorization is terminated  or revoked sooner.       Influenza A by PCR NEGATIVE NEGATIVE Final   Influenza B by PCR NEGATIVE NEGATIVE Final    Comment: (NOTE) The Xpert Xpress SARS-CoV-2/FLU/RSV plus assay is intended as an aid in the diagnosis of influenza from Nasopharyngeal swab specimens and should not be used as a sole basis for treatment. Nasal washings and aspirates are unacceptable for Xpert Xpress SARS-CoV-2/FLU/RSV testing.  Fact Sheet for Patients: BloggerCourse.com  Fact Sheet for Healthcare Providers: SeriousBroker.it  This test is not yet approved or cleared by the [place] FDA and has been authorized for detection and/or diagnosis of SARS-CoV-2 by FDA under an Emergency Use Authorization (EUA). This EUA will remain in effect (meaning this test can be used) for the duration of the COVID-19 declaration under Section 564(b)(1) of the Act, 21 U.S.C. section 360bbb-3(b)(1), unless the authorization is terminated or revoked.  Performed at [facility], [address] [place], [place] [address]     Radiology Reports CT HEAD WO CONTRAST  Result Date: 02/24/2021 CLINICAL DATA:  Follow-up subdural collection EXAM: CT HEAD WITHOUT CONTRAST TECHNIQUE: Contiguous axial images were obtained from the base of the skull through the vertex without intravenous contrast. COMPARISON:  Three days ago FINDINGS: Brain: Bilateral intermediate to low-density subdural collection/thickening measuring up to 3 mm in thickness and not causing any mass effect on the atrophic brain. No high-density/acute hemorrhage. No evidence of infarct, mass, or hydrocephalus. Generalized atrophy and chronic small vessel ischemia Vascular: No  hyperdense vessel or unexpected calcification. Skull: Normal. Negative for fracture or focal lesion. Sinuses/Orbits: No acute finding. Mucosal thickening in the left sphenoid sinus. IMPRESSION: Stable nonacute bilateral subdural hematoma without cerebral mass effect. No new abnormality. Electronically Signed   By: Marnee Spring M.D.   On: 02/24/2021 10:15   CT Head Wo Contrast  Result Date: 02/21/2021 CLINICAL DATA:  Dementia, Parkinson's disease, hypoglycemia, altered level of consciousness EXAM: CT HEAD WITHOUT CONTRAST TECHNIQUE: Contiguous axial images were obtained from the base of the skull through the vertex without intravenous contrast. COMPARISON:  06/05/2020 FINDINGS: Brain: No acute infarct or hemorrhage. Chronic small vessel ischemic changes are seen within the periventricular white matter. Lateral ventricles and midline structures are unremarkable. Stable bilateral less than 3 mm chronic subdural hematomas are again noted along the convexities. No acute extra-axial fluid collections. No mass effect. Vascular: No hyperdense vessel or unexpected calcification. Skull: Normal. Negative for fracture or focal lesion. Sinuses/Orbits: Polypoid mucosal thickening left sphenoid sinus. Remaining sinuses are clear. Chronic left mastoid effusion. Other: None. IMPRESSION: 1. Stable less than 3 mm chronic bilateral subdural hematomas or dural thickening. No acute hemorrhage. 2. Chronic small vessel ischemic changes.  No acute infarct. Electronically Signed   By: Sharlet Salina M.D.   On: 02/21/2021 15:56   DG Chest Portable 1 View  Result Date: 02/21/2021 CLINICAL DATA:  Altered. EXAM: PORTABLE CHEST 1 VIEW COMPARISON:  Radiograph 12/31/2020 FINDINGS: Low lung volumes. Stable heart size and mediastinal contours for technique. Aortic atherosclerosis. Streaky retrocardiac atelectasis. No confluent airspace disease. No pulmonary edema. No pleural effusion or pneumothorax. No acute osseous abnormalities are  seen. IMPRESSION: Low lung volumes with retrocardiac atelectasis. Electronically Signed   By: Narda Rutherford M.D.   On: 02/21/2021 17:15    SIGNED: Kendell Bane, MD, FHM. [facility],  Pager (please use [email or web address] to  page/text) Please use Epic Secure Chat for non-urgent communication (7AM-7PM)  If 7PM-7AM, please contact night-coverage [email or web address], 02/24/2021, 10:30 AM

## 2021-02-25 ENCOUNTER — Encounter (HOSPITAL_COMMUNITY): Payer: Self-pay | Admitting: Family Medicine

## 2021-02-25 ENCOUNTER — Inpatient Hospital Stay (HOSPITAL_COMMUNITY)
Admit: 2021-02-25 | Discharge: 2021-02-25 | Disposition: A | Discharge status: Home / Self Care | Payer: Medicare Other | Attending: Family Medicine | Admitting: Family Medicine

## 2021-02-25 DIAGNOSIS — F0281 Dementia in other diseases classified elsewhere with behavioral disturbance: Secondary | ICD-10-CM | POA: Diagnosis not present

## 2021-02-25 DIAGNOSIS — G9340 Encephalopathy, unspecified: Secondary | ICD-10-CM | POA: Diagnosis not present

## 2021-02-25 DIAGNOSIS — Z515 Encounter for palliative care: Secondary | ICD-10-CM

## 2021-02-25 DIAGNOSIS — N390 Urinary tract infection, site not specified: Secondary | ICD-10-CM | POA: Diagnosis not present

## 2021-02-25 DIAGNOSIS — N179 Acute kidney failure, unspecified: Secondary | ICD-10-CM | POA: Diagnosis not present

## 2021-02-25 DIAGNOSIS — G20 Parkinson's disease: Secondary | ICD-10-CM | POA: Diagnosis not present

## 2021-02-25 DIAGNOSIS — L8945 Pressure ulcer of contiguous site of back, buttock and hip, unstageable: Secondary | ICD-10-CM

## 2021-02-25 DIAGNOSIS — Z7189 Other specified counseling: Secondary | ICD-10-CM | POA: Diagnosis not present

## 2021-02-25 LAB — BASIC METABOLIC PANEL
Anion gap: 7 (ref 5–15)
BUN: 27 mg/dL — ABNORMAL HIGH (ref 8–23)
CO2: 22 mmol/L (ref 22–32)
Calcium: 7.9 mg/dL — ABNORMAL LOW (ref 8.9–10.3)
Chloride: 107 mmol/L (ref 98–111)
Creatinine, Ser: 1.57 mg/dL — ABNORMAL HIGH (ref 0.61–1.24)
GFR, Estimated: 42 mL/min — ABNORMAL LOW (ref >60)
Glucose, Bld: 156 mg/dL — ABNORMAL HIGH (ref 70–99)
Potassium: 3.9 mmol/L (ref 3.5–5.1)
Sodium: 136 mmol/L (ref 135–145)

## 2021-02-25 LAB — GLUCOSE, CAPILLARY
Glucose-Capillary: 139 mg/dL — ABNORMAL HIGH (ref 70–99)
Glucose-Capillary: 159 mg/dL — ABNORMAL HIGH (ref 70–99)
Glucose-Capillary: 155 mg/dL — ABNORMAL HIGH (ref 70–99)
Glucose-Capillary: 165 mg/dL — ABNORMAL HIGH (ref 70–99)

## 2021-02-25 LAB — URINALYSIS, ROUTINE W REFLEX MICROSCOPIC
Bacteria, UA: NONE SEEN
Bilirubin Urine: NEGATIVE
Glucose, UA: NEGATIVE mg/dL
Ketones, ur: NEGATIVE mg/dL
Nitrite: NEGATIVE
Protein, ur: NEGATIVE mg/dL
Specific Gravity, Urine: 1.009 (ref 1.005–1.030)
WBC, UA: >50 WBC/hpf — ABNORMAL HIGH (ref 0–5)
pH: 6 (ref 5.0–8.0)

## 2021-02-25 LAB — CBC
HCT: 32.6 % — ABNORMAL LOW (ref 39.0–52.0)
Hemoglobin: 10.6 g/dL — ABNORMAL LOW (ref 13.0–17.0)
MCH: 28.6 pg (ref 26.0–34.0)
MCHC: 32.5 g/dL (ref 30.0–36.0)
MCV: 87.9 fL (ref 80.0–100.0)
Platelets: 103 10*3/uL — ABNORMAL LOW (ref 150–400)
RBC: 3.71 MIL/uL — ABNORMAL LOW (ref 4.22–5.81)
RDW: 14.3 % (ref 11.5–15.5)
WBC: 6 10*3/uL (ref 4.0–10.5)
nRBC: 0 % (ref 0.0–0.2)

## 2021-02-25 MED ORDER — CIPROFLOXACIN HCL 250 MG PO TABS
500.0000 mg | ORAL_TABLET | Freq: Two times a day (BID) | ORAL | Status: DC
  Administered 2021-02-25 – 2021-02-26 (×4): 500 mg via ORAL
  Filled 2021-02-25 (×6): qty 2

## 2021-02-25 MED ORDER — SODIUM CHLORIDE 0.9 % IV SOLN: INTRAVENOUS | Status: AC

## 2021-02-25 NOTE — TOC Progression Note (Signed)
Transition of Care [facility]) - Progression Note    Patient Details  Name: [name] MRN: [id number] Date of Birth: [birth year]  [facility] CM/SW Contact  Karn [place], [place] Phone Number: 02/25/2021, 3:26 PM  Clinical Narrative:  LCSW spoke with pt's wife who requests [facility]. Facility has offered bed. LCSW also made referral to [facility] and Palliative Care for outpatient palliative follow up at [facility] Pt's wife aware and agreeable. CMA to update authorization with SNF selected.      Expected Discharge Plan: Skilled Nursing Facility Barriers to Discharge: Barriers Resolved  Expected Discharge Plan and Services Expected Discharge Plan: Skilled Nursing Facility       Living arrangements for the past 2 months: Single Family Home                                       Social Determinants of Health (SDOH) Interventions    Readmission Risk Interventions No flowsheet data found.

## 2021-02-25 NOTE — Procedures (Signed)
Patient Name: Demarian Epps  MRN: [id number]  Epilepsy Attending: Charlsie Quest  Referring Physician/Provider: Dr Nevin Bloodgood  Date: 02/25/2021 Duration: 23.50 mins  Patient history: 87 year old male with altered mental status.  EEG to evaluate for seizures.  Level of alertness: Awake  AEDs during EEG study: None  Technical aspects: This EEG study was done with scalp electrodes positioned according to the 10-20 International system of electrode placement. Electrical activity was acquired at a sampling rate of 500Hz  and reviewed with a high frequency filter of 70Hz  and a low frequency filter of 1Hz . EEG data were recorded continuously and digitally stored.   Description: The posterior dominant rhythm consists of 8 Hz activity of moderate voltage (25-35 uV) seen predominantly in posterior head regions, symmetric and reactive to eye opening and eye closing. Drowsiness was characterized by attenuation of the posterior background rhythm.  Patient was noted to have bilateral upper extremity tremors intermittently throughout the EEG.  Concomitant EEG before, during and after the event did not show any EEG changes suggest seizure.  Hyperventilation and photic stimulation were not performed.     IMPRESSION: This study is within normal limits. No seizures or epileptiform discharges were seen throughout the recording.  Patient was noted to have marked upper extremity tremors intermittently throughout the EEG without concomitant EEG change.  These episodes were not epileptic.  Kimblery Diop 

## 2021-02-25 NOTE — Progress Notes (Signed)
EEG completed, results pending. 

## 2021-02-25 NOTE — Progress Notes (Signed)
PROGRESS NOTE    Patient: [name]                            PCP: [name], FNP                    DOB: December 02, 1929            DOA: 02/21/2021 GYJ:[id number]             DOS: 02/25/2021, 9:31 AM   LOS: 2 days   Date of Service: The patient was seen and examined on 02/25/2021  Subjective:   The patient was seen and examined this morning, much more awake alert, pleasantly confused No sign of agitation or aggression overnight or this morning.     Brief Narrative:   [name]  is a 87 y.o. male, past medical history of diabetes mellitus, insulin-dependent, dementia, GERD, Parkinson disease, hypertension, patient was brought to ED by his wife secondary to change in his mental status. She reports that the patient's agitation confusion is progressively getting worse especially over past week..  Patient also has had episodes of hypoglycemia and falls.  patient had wounds to the left buttocks, and left foot, wife report usually patient using walker and wheelchair, but he is with increased falls recently. ED his work-up was significant for creatinine of 1.48, platelet of 114 K, hemoglobin of 10.7, urinalysis is positive with significant pyuria and positive leukocyte esterase, his CT head with stable less than 3 mm chronic bilateral subdural hematomas or dural thickening, with no acute hemorrhage.     Assessment & Plan:   Active Problems:   Acute renal failure (HCC)   Diabetes (HCC)   Failure to thrive in adult   Dementia due to Parkinson's disease with behavioral disturbance (HCC)   Parkinson disease (HCC)   Dementia associated with other underlying disease with behavioral disturbance (HCC)   Acute lower UTI   Pressure injury of skin   Encephalopathy      Failure to thrive/with encephalopathy -Not much improvement noted -Progressing to severe debility, worsening mentation (poor interaction, poor p.o. intake) -As mentation improves, will encourage more p.o.  intake   -Encephalopathy likely metabolic worsening by UTI-and underlying Parkinson dementia  -Continue to treat underlying cause of infection UTI, -Encourage p.o. intake, -We will consult dietitian -Started PT OT, patient not ambulating, recommending SNF placement  Acute metabolic encephalopathy Patient is less lethargic, more awake this morning pleasantly confused -All sedative medications been discontinued, -CT of the head was repeated today 02/14/2021 reported stable nonacute bilateral subdural hematoma without cerebral mass affect -Repeating EEG -Cutting down his Keppra from 750 to 500 mg PO daily-Complicated by UTI, progressive Parkinson dementia, failure to thrive Poor p.o. intake leading to dehydration, malnutrition   UTI -Urine culture greater than 100 K staph epididymis, pansensitive (questionable contaminant) -Patient has been on IV Rocephin, due to sensitivity will be switched to p.o. ciprofloxacin today 02/25/2021 -No signs of sirs or sepsis, afebrile, mildly hypertensive   Parkinson disease-Parkinson dementia -With encephalopathy, some improvement noted today -We will continue with home medications include Sinemet -It seems that he is progressively declining with debility, poor mentation, No visible tremors at this time   Dementia-severe advanced -Continue with home medication include Depakote, Aricept and Namenda -We will continue to hold Seroquel given altered mental status -Reducing Depakote, anticipating holding Aricept and Namenda   Diabetes mellitus II  -Patient is on Lantus at home, resumed at  lower dose due to poor p.o. intake -Checking CBG QA CHS, SSI coverage -will hold semaglutide   Hypertension --hypotensive -Discontinued home medication of Norvasc and lisinopril -Continuing IV fluid -Blood pressure has improved today   Hyperlipidemia -Discontinuing statins as the risk outweighs the benefit at his age and continued decline   GERD -Holding PPI  in face of antibiotics to reduce the chance of adverse effect including C. difficile   CKD stage III  -Worsening kidney function in the face of poor p.o. intake -Reinitiating IV fluid -Encouraging p.o. intake -Monitoring BUN/creatinine closely, avoiding nephrotoxins, -BUN 27, creatinine 1.57 >>>   PVD -continue with cilostazol and statin -Stable   Subdural hematoma  -CT head showing stable less than 3 mm chronic bilateral subdural hematomas or dural thickening. No acute hemorrhage.  -Continue to monitor -Repeat CT head 02/24/2021 revealed bilateral unchanged subdural hematoma, no new changes      Skin Assessment: I have examined the patient's skin and I agree with the wound assessment as performed by wound care team As outlined belowe: Pressure Injury 02/21/21 Buttocks Left;Medial Stage 2 -  Partial thickness loss of dermis presenting as a shallow open injury with a red, pink wound bed without slough. nickel sized, pink wound base, no drainage noted (Active)  02/21/21 1845  Location: Buttocks  Location Orientation: Left;Medial  Staging: Stage 2 -  Partial thickness loss of dermis presenting as a shallow open injury with a red, pink wound bed without slough.  Wound Description (Comments): nickel sized, pink wound base, no drainage noted  Present on Admission: Yes     Pressure Injury 02/21/21 Buttocks Right;Medial Stage 2 -  Partial thickness loss of dermis presenting as a shallow open injury with a red, pink wound bed without slough. pencil eraser sized, pink base, no drainage (Active)  02/21/21 1845  Location: Buttocks  Location Orientation: Right;Medial  Staging: Stage 2 -  Partial thickness loss of dermis presenting as a shallow open injury with a red, pink wound bed without slough.  Wound Description (Comments): pencil eraser sized, pink base, no drainage  Present on Admission: Yes   Ethics: Due to multiple comorbidities status decline, physically and mentally-prognosis  remains poor, CODE STATUS remains DNR/DNI -currently we are recommending comfort care measures Possible hospice Palliative care consulted to determine goal of care  Severe debility -patient is now ambulating, status post evaluation by PT OT, recommending SNF  ------------------------------------------------------------------------------------------------------------------------------------------------- Cultures; Urine Culture  >>> NGT    Antimicrobials: IV Rocephin   Consultants: PT/OT   ------------------------------------------------------------------------------------------------------------------------------------------------  DVT prophylaxis:  SCD/Compression stockings and Heparin SQ Code Status:   Code Status: DNR Code Status DNR confirmed by wife  Family Communication: Discussed with his wife over the phone She is agreeable to palliative care consult, if no improvement hospice She is agreeable to SNF placement   The above findings and plan of care has been discussed with patient (wife)  in detail,  they expressed understanding and agreement of above. -Advance care planning has been discussed.   Admission status:   Status is: Inpt   The patient will require care spanning > 2 midnights and should be moved to inpatient because: Inpatient level of care appropriate due to severity of illness  Dispo: The patient is from: Home              Anticipated d/c is to: SNF in 2-3 days               Patient currently is not medically stable to  d/c.   Difficult to place patient No      Level of care: Telemetry   Procedures:   No admission procedures for hospital encounter.    Antimicrobials:  Anti-infectives (From admission, onward)    Start     Dose/Rate Route Frequency Ordered Stop   02/25/21 0915  ciprofloxacin (CIPRO) tablet 500 mg        500 mg Oral 2 times daily 02/25/21 0822     02/21/21 2130  cefTRIAXone (ROCEPHIN) 1 g in sodium chloride 0.9 % 100 mL IVPB   Status:  Discontinued        1 g 200 mL/hr over 30 Minutes Intravenous Every 24 hours 02/21/21 2038 02/25/21 6063        Medication:   aspirin EC  81 mg Oral Daily   carbidopa-levodopa  1 tablet Oral TID   cilostazol  100 mg Oral Daily   ciprofloxacin  500 mg Oral BID   divalproex  500 mg Oral Daily   heparin  5,000 Units Subcutaneous Q8H   insulin aspart  0-5 Units Subcutaneous QHS   insulin aspart  0-9 Units Subcutaneous TID WC   vitamin B-12  100 mcg Oral q morning       Objective:   Vitals:   02/24/21 1010 02/24/21 1436 02/24/21 2007 02/25/21 0557  BP: (!) 89/56 (!) 97/58 93/66 103/60  Pulse: 97 96 94 83  Resp: 16 18 20 18   Temp: 97.6 F (36.4 C) 98.7 F (37.1 C) (!) 97.1 F (36.2 C) 98.9 F (37.2 C)  TempSrc:  Oral    SpO2: 99% 99% 100% 100%  Weight:      Height:        Intake/Output Summary (Last 24 hours) at 02/25/2021 0931 Last data filed at 02/25/2021 0849 Gross per 24 hour  Intake 1803.98 ml  Output 950 ml  Net 853.98 ml   Filed Weights   02/21/21 1331  Weight: 83 kg     Examination:       Physical Exam:   General:  More awake alert, pleasantly confused  HEENT:  Normocephalic, PERRL, otherwise with in Normal limits   Neuro:  CNII-XII intact. , normal motor and sensation, reflexes intact   Lungs:   Clear to auscultation BL, Respirations unlabored, no wheezes / crackles  Cardio:    S1/S2, RRR, No murmure, No Rubs or Gallops   Abdomen:   Soft, non-tender, bowel sounds active all four quadrants,  no guarding or peritoneal signs.  Muscular skeletal:  Severe global weakness, Limited exam - in bed, able to move all 4 extremities, Normal strength,  2+ pulses,  symmetric,   Skin:  Dry, warm to touch, negative for any Rashes,  Wounds: Please see nursing documentation  Pressure Injury 02/21/21 Buttocks Left;Medial Stage 2 -  Partial thickness loss of dermis presenting as a shallow open injury with a red, pink wound bed without slough. nickel  sized, pink wound base, no drainage noted (Active)  02/21/21 1845  Location: Buttocks  Location Orientation: Left;Medial  Staging: Stage 2 -  Partial thickness loss of dermis presenting as a shallow open injury with a red, pink wound bed without slough.  Wound Description (Comments): nickel sized, pink wound base, no drainage noted  Present on Admission: Yes     Pressure Injury 02/21/21 Buttocks Right;Medial Stage 2 -  Partial thickness loss of dermis presenting as a shallow open injury with a red, pink wound bed without slough. pencil eraser sized, pink base, no drainage (  Active)  02/21/21 1845  Location: Buttocks  Location Orientation: Right;Medial  Staging: Stage 2 -  Partial thickness loss of dermis presenting as a shallow open injury with a red, pink wound bed without slough.  Wound Description (Comments): pencil eraser sized, pink base, no drainage  Present on Admission: Yes               ------------------------------------------------------------------------------------------------------------------------------------------    LABs:  CBC Latest Ref Rng & Units 02/25/2021 02/24/2021 02/22/2021  WBC 4.0 - 10.5 K/uL 6.0 6.1 6.0  Hemoglobin 13.0 - 17.0 g/dL 10.6(L) 12.2(L) 11.6(L)  Hematocrit 39.0 - 52.0 % 32.6(L) 38.8(L) 36.3(L)  Platelets 150 - 400 K/uL 103(L) 123(L) 112(L)   CMP Latest Ref Rng & Units 02/25/2021 02/24/2021 02/22/2021  Glucose 70 - 99 mg/dL 578(I) 696(E) 952(W)  BUN 8 - 23 mg/dL 41(L) 19 16  Creatinine 0.61 - 1.24 mg/dL 2.44(W) 1.02(V) 2.53  Sodium 135 - 145 mmol/L 136 137 138  Potassium 3.5 - 5.1 mmol/L 3.9 4.2 4.2  Chloride 98 - 111 mmol/L 107 104 107  CO2 22 - 32 mmol/L Calcium 8.9 - 10.3 mg/dL 7.9(L) 9.1 9.0  Total Protein 6.5 - 8.1 g/dL - - -  Total Bilirubin 0.3 - 1.2 mg/dL - - -  Alkaline Phos 38 - 126 U/L - - -  AST 15 - 41 U/L - - -  ALT 0 - 44 U/L - - -       Micro Results Recent Results (from the past 240 hour(s))  Urine  Culture     Status: Abnormal   Collection Time: 02/21/21  1:55 PM   Specimen: In/Out Cath Urine  Result Value Ref Range Status   Specimen Description   Final    IN/OUT CATH URINE Performed at [facility], [address], [place], [place] [address]    Special Requests   Final    NONE Performed at [facility], [address] [place], [place] [address]    Culture >=100,000 COLONIES/mL STAPHYLOCOCCUS EPIDERMIDIS (A)  Final   Report Status 02/24/2021 FINAL  Final   Organism ID, Bacteria STAPHYLOCOCCUS EPIDERMIDIS (A)  Final      Susceptibility   Staphylococcus epidermidis - MIC*    CIPROFLOXACIN <=0.5 SENSITIVE Sensitive     GENTAMICIN <=0.5 SENSITIVE Sensitive     NITROFURANTOIN <=16 SENSITIVE Sensitive     OXACILLIN <=0.25 SENSITIVE Sensitive     TETRACYCLINE <=1 SENSITIVE Sensitive     VANCOMYCIN <=0.5 SENSITIVE Sensitive     TRIMETH/SULFA <=10 SENSITIVE Sensitive     CLINDAMYCIN <=0.25 SENSITIVE Sensitive     RIFAMPIN <=0.5 SENSITIVE Sensitive     Inducible Clindamycin NEGATIVE Sensitive     * >=100,000 COLONIES/mL STAPHYLOCOCCUS EPIDERMIDIS  Resp Panel by RT-PCR (Flu A&B, Covid) Nasopharyngeal Swab     Status: None   Collection Time: 02/21/21  1:56 PM   Specimen: Nasopharyngeal Swab; Nasopharyngeal(NP) swabs in vial transport medium  Result Value Ref Range Status   SARS Coronavirus 2 by RT PCR NEGATIVE NEGATIVE Final    Comment: (NOTE) SARS-CoV-2 target nucleic acids are NOT DETECTED.  The SARS-CoV-2 RNA is generally detectable in upper respiratory specimens during the acute phase of infection. The lowest concentration of SARS-CoV-2 viral copies this assay can detect is 138 copies/mL. A negative result does not preclude SARS-Cov-2 infection and should not be used as the sole basis for treatment or other patient management decisions. A negative result may occur with  improper specimen collection/handling, submission of  specimen other than nasopharyngeal swab, presence of  viral mutation(s) within the areas targeted by this assay, and inadequate number of viral copies(<138 copies/mL). A negative result must be combined with clinical observations, patient history, and epidemiological information. The expected result is Negative.  Fact Sheet for Patients:  BloggerCourse.com  Fact Sheet for Healthcare Providers:  SeriousBroker.it  This test is no t yet approved or cleared by the [place] FDA and  has been authorized for detection and/or diagnosis of SARS-CoV-2 by FDA under an Emergency Use Authorization (EUA). This EUA will remain  in effect (meaning this test can be used) for the duration of the COVID-19 declaration under Section 564(b)(1) of the Act, 21 U.S.C.section 360bbb-3(b)(1), unless the authorization is terminated  or revoked sooner.       Influenza A by PCR NEGATIVE NEGATIVE Final   Influenza B by PCR NEGATIVE NEGATIVE Final    Comment: (NOTE) The Xpert Xpress SARS-CoV-2/FLU/RSV plus assay is intended as an aid in the diagnosis of influenza from Nasopharyngeal swab specimens and should not be used as a sole basis for treatment. Nasal washings and aspirates are unacceptable for Xpert Xpress SARS-CoV-2/FLU/RSV testing.  Fact Sheet for Patients: BloggerCourse.com  Fact Sheet for Healthcare Providers: SeriousBroker.it  This test is not yet approved or cleared by the [place] FDA and has been authorized for detection and/or diagnosis of SARS-CoV-2 by FDA under an Emergency Use Authorization (EUA). This EUA will remain in effect (meaning this test can be used) for the duration of the COVID-19 declaration under Section 564(b)(1) of the Act, 21 U.S.C. section 360bbb-3(b)(1), unless the authorization is terminated or revoked.  Performed at [facility], [address] [place], [place] [address]     Radiology Reports CT HEAD WO  CONTRAST  Result Date: 02/24/2021 CLINICAL DATA:  Follow-up subdural collection EXAM: CT HEAD WITHOUT CONTRAST TECHNIQUE: Contiguous axial images were obtained from the base of the skull through the vertex without intravenous contrast. COMPARISON:  Three days ago FINDINGS: Brain: Bilateral intermediate to low-density subdural collection/thickening measuring up to 3 mm in thickness and not causing any mass effect on the atrophic brain. No high-density/acute hemorrhage. No evidence of infarct, mass, or hydrocephalus. Generalized atrophy and chronic small vessel ischemia Vascular: No hyperdense vessel or unexpected calcification. Skull: Normal. Negative for fracture or focal lesion. Sinuses/Orbits: No acute finding. Mucosal thickening in the left sphenoid sinus. IMPRESSION: Stable nonacute bilateral subdural hematoma without cerebral mass effect. No new abnormality. Electronically Signed   By: Marnee Spring M.D.   On: 02/24/2021 10:15   CT Head Wo Contrast  Result Date: 02/21/2021 CLINICAL DATA:  Dementia, Parkinson's disease, hypoglycemia, altered level of consciousness EXAM: CT HEAD WITHOUT CONTRAST TECHNIQUE: Contiguous axial images were obtained from the base of the skull through the vertex without intravenous contrast. COMPARISON:  06/05/2020 FINDINGS: Brain: No acute infarct or hemorrhage. Chronic small vessel ischemic changes are seen within the periventricular white matter. Lateral ventricles and midline structures are unremarkable. Stable bilateral less than 3 mm chronic subdural hematomas are again noted along the convexities. No acute extra-axial fluid collections. No mass effect. Vascular: No hyperdense vessel or unexpected calcification. Skull: Normal. Negative for fracture or focal lesion. Sinuses/Orbits: Polypoid mucosal thickening left sphenoid sinus. Remaining sinuses are clear. Chronic left mastoid effusion. Other: None. IMPRESSION: 1. Stable less than 3 mm chronic bilateral subdural hematomas  or dural thickening. No acute hemorrhage. 2. Chronic small vessel ischemic changes.  No acute infarct. Electronically Signed   By: Sharlet Salina  M.D.   On: 02/21/2021 15:56   DG Chest Portable 1 View  Result Date: 02/21/2021 CLINICAL DATA:  Altered. EXAM: PORTABLE CHEST 1 VIEW COMPARISON:  Radiograph 12/31/2020 FINDINGS: Low lung volumes. Stable heart size and mediastinal contours for technique. Aortic atherosclerosis. Streaky retrocardiac atelectasis. No confluent airspace disease. No pulmonary edema. No pleural effusion or pneumothorax. No acute osseous abnormalities are seen. IMPRESSION: Low lung volumes with retrocardiac atelectasis. Electronically Signed   By: Narda RutherfordMelanie  Sanford M.D.   On: 02/21/2021 17:15    SIGNED: Kendell BaneSeyed A Karelly Dewalt, MD, FHM. [facility],  Pager (please use [email or web address] to page/text) Please use Epic Secure Chat for non-urgent communication (7AM-7PM)  If 7PM-7AM, please contact night-coverage [email or web address], 02/25/2021, 9:31 AM

## 2021-02-25 NOTE — Plan of Care (Signed)
  Problem: Education: Goal: Knowledge of General Education information will improve Description Including pain rating scale, medication(s)/side effects and non-pharmacologic comfort measures Outcome: Progressing   Problem: Health Behavior/Discharge Planning: Goal: Ability to manage health-related needs will improve Outcome: Progressing   

## 2021-02-25 NOTE — Progress Notes (Signed)
Physical Therapy Treatment Patient Details Name: [name] MRN: [id number] DOB: [birth year] Today's Date: 02/25/2021    History of Present Illness [name]  is a 87 y.o. male, past medical history of diabetes mellitus, insulin-dependent, dementia, GERD, Parkinson disease, hypertension, patient was brought to ED by his wife secondary to ental status, she reports patient with worsening agitation, confusion, progressive over the past week, as well patient with episodes of hypoglycemia, and fall where EMS assessed him for couple times, but patient refused evaluation, patient had wounds to the left buttocks, and left foot, wife report usually patient using walker and wheelchair, but he is with increased falls recently.  -In ED his work-up was significant for creatinine of 1.48, platelet of 114 K, hemoglobin of 10.7, urinalysis is positive with significant pyuria and positive leukocyte esterase, his CT head with stable less than 3 mm chronic bilateral subdural hematomas or dural thickening, with no acute hemorrhage.  [facility] hospitalist consulted to admit.    PT Comments    Patient agreeable to participating in therapy session today. Patient required min guard to min assist for bed mobility, especially assist from trunk with supine to sit.  Patient performed fairly well sitting at EOB and exhibited good tolerance. Patient required min assist for anterior-posterior scoots and mod assist for sit to stand and stand pivot transfers exhibiting a strong posterior lean. PT observed patient was actively having a BM during short lateral steps along the bedside with RW. Patient required multimodal cuing and physical assist to move RW to the right. Patient limited by fatigue. Patient assisted back to bed in supine position. Nursing notified patient would require clothing management and personal hygiene assistance.    Follow Up Recommendations  SNF     Equipment Recommendations  None recommended by PT     Recommendations for Other Services       Precautions / Restrictions Precautions Precautions: Fall Restrictions Weight Bearing Restrictions: No    Mobility  Bed Mobility Overal bed mobility: Needs Assistance Bed Mobility: Supine to Sit;Sit to Supine     Supine to sit: Min guard;Min assist;HOB elevated Sit to supine: Min assist;HOB elevated   General bed mobility comments: min assist for trunk and lower extremities; increased time; labored movement    Transfers Overall transfer level: Needs assistance Equipment used: Rolling walker (2 wheeled) Transfers: Sit to/from [employer];Anterior-Posterior Transfer Sit to Stand: Mod assist Stand pivot transfers: Mod assist   Anterior-Posterior transfers: Min assist   General transfer comment: increased time, slow labored movement, strong posterior lean  Ambulation/Gait Ambulation/Gait assistance: Mod assist Gait Distance (Feet): 4 Feet Assistive device: Rolling walker (2 wheeled) Gait Pattern/deviations: Step-to pattern;Decreased step length - right;Decreased step length - left;Decreased stride length;Decreased dorsiflexion - right;Decreased dorsiflexion - left;Shuffle;Leaning posteriorly;Narrow base of support Gait velocity: decreased   General Gait Details: slow labored gait with RW requiring mod assist from strong posterior lean, short steps, assist to move RW to right; gait limited to 5-6 short steps along bedside to the right towards hed of bed; limited by fatigue; on room air.   Stairs             Wheelchair Mobility    Modified Rankin (Stroke Patients Only)       Balance Overall balance assessment: Needs assistance Sitting-balance support: Feet supported;No upper extremity supported Sitting balance-Leahy Scale: Fair (fair to good) Sitting balance - Comments: EOB Postural control: Posterior lean Standing balance support: Bilateral upper extremity supported;During functional activity Standing  balance-Leahy Scale: Poor Standing  balance comment: using RW                            Cognition Arousal/Alertness: Awake/alert Behavior During Therapy: WFL for tasks assessed/performed Overall Cognitive Status: History of cognitive impairments - at baseline                                        Exercises      General Comments        Pertinent Vitals/Pain Pain Assessment: No/denies pain    Home Living                      Prior Function            PT Goals (current goals can now be found in the care plan section) Acute Rehab PT Goals Patient Stated Goal: pt unable to participate Progress towards PT goals: Progressing toward goals    Frequency    Min 3X/week      PT Plan Current plan remains appropriate       AM-PAC PT "6 Clicks" Mobility   Outcome Measure  Help needed turning from your back to your side while in a flat bed without using bedrails?: None Help needed moving from lying on your back to sitting on the side of a flat bed without using bedrails?: A Little Help needed moving to and from a bed to a chair (including a wheelchair)?: A Lot Help needed standing up from a chair using your arms (e.g., wheelchair or bedside chair)?: A Lot Help needed to walk in hospital room?: A Lot Help needed climbing 3-5 steps with a railing? : A Lot 6 Click Score: 15    End of Session Equipment Utilized During Treatment: Gait belt Activity Tolerance: Patient tolerated treatment well;Patient limited by fatigue Patient left: in bed;with call bell/phone within reach;with bed alarm set Nurse Communication: Mobility status PT Visit Diagnosis: Unsteadiness on feet (R26.81);Other abnormalities of gait and mobility (R26.89);Muscle weakness (generalized) (M62.81);History of falling (Z91.81)     Time: 3646-8032 PT Time Calculation (min) (ACUTE ONLY): 25 min  Charges:  $Therapeutic Activity: 23-37 mins                     Katina Dung.  Hartnett-Rands, MS, PT [facility] [place] [phone]  Britta Mccreedy  Hartnett-Rands 02/25/2021, 11:22 AM

## 2021-02-25 NOTE — Consult Note (Signed)
Consultation Note Date: 02/25/2021   Patient Name: [name]  DOB: [birth year]  MRN: [id number]  Age / Sex: 87 y.o., male  PCP: [name], [name] Referring Physician: Kendell Kramer, [name]  Reason for Consultation: Establishing goals of care  HPI/Patient Profile: 87 y.o. male  with past medical history of IDDM, dementia, Parkinson's disease, HTN/HLD, GERD, hearing loss, arthritis admitted on 02/21/2021 with acute renal failure, failure to thrive, with encephalopathy.   Clinical Assessment and Goals of Care: I have reviewed medical records including EPIC notes, labs and imaging, received report from RN, assessed the patient.  [name] is lying quietly in bed with physical therapy at bedside.  He will briefly make but not keep eye contact.  He appears chronically ill and somewhat frail.  He has known memory loss, but is able to tell me his name.  He tells me [place] in [place].  I am not sure that he can make his basic needs known.  There is no family at bedside at this time.    Call to wife, [name]  to discuss diagnosis prognosis, GOC, EOL wishes, disposition and options.   I introduced Palliative Medicine as specialized medical care for people living with serious illness. It focuses on providing relief from the symptoms and stress of a serious illness. The goal is to improve quality of life for both the patient and the family.  We discussed a brief life review of the patient.  Mr. and [name] have been married for 68 years.  They have an adult daughter who lives nearby and a grandson who helps with [name] care, but he will be leaving for college next month.  Mrs. [name] shares that her spouse worked in the [employer] for many years.  [place] to [place] years ago.  [name] continue to work until about 4 to 5 years ago.  She states that she has seen a decline over the last 2  years, last 1 year in particular.  He was first diagnosed with memory loss/Parkinson's about 5 years ago.  She shares that he has not left the home in about 2 years.  She shares that he sleeps in his lift chair, unable to sleep in a bed.  She shares that he needs assistance toileting, and at this point is unable to self toilet, must be lifted on and off the pot.      We talked about [name] acute kidney injury, failure to thrive, diabetes.we talked about the chronic illness pathway, what is normal and expected for those with memory loss.  Mrs. [name] tells me that she understands that memory loss is a progressive disease, but she continues to state "he has to be cared for".  The natural disease trajectory and expectations at EOL were discussed.  I attempted to elicit values and goals of care important to the patient.  Mrs. [name] continues to share that she feels she is unable to care for her husband at home.  I  ask if he would ever want to live in a nursing home, and she shares that he is mad at her now because she brought him to the hospital.  We talked about short-term rehab, but the reality is that he will not be better than he was prior to hospitalization, he will still need maximum support at home.  We talked about short-term rehab.  At this point [name] is agreeable to discharge to short-term rehab, [name] is facility of choice, with outpatient palliative services to follow.   Advanced directives, concepts specific to code status, were considered and discussed.  [name] is DNR  Hospice and Palliative Care services outpatient were explained and offered.  At this point, [name] is agreeable to outpatient palliative services to follow at short-term rehab.  We continue to explore what is important to [name].  We talked about the benefits of at home hospice for continued support.  Mrs. Brandon Epley shares that she is open to at home "treat the treatable" hospice in the future, but clearly needs  more time and education about hospice care.  Discussed the importance of continued conversation with family and the medical providers regarding overall plan of care and treatment options, ensuring decisions are within the context of the patient's values and GOCs.  Questions and concerns were addressed.   The family was encouraged to call with questions or concerns.  PMT will continue to support holistically.  Conference with attending, bedside nursing staff, transition of care team related to patient condition, needs, goals of care, disposition.  HCPOA   NEXT OF KIN -wife of 68 years, "Brandon" [name].  They have a daughter who lives nearby, and a grandson who helps with care at times.   SUMMARY OF RECOMMENDATIONS   Continue to treat the treatable but no CPR or intubation. Agreeable to short-term rehab, [facility] facility of choice Outpatient palliative to follow. Interested and at home "treat the treatable" hospice care in the future   Code Status/Advance Care Planning: DNR  Symptom Management:  Per hospitalist, no additional needs at this time.  Palliative Prophylaxis:  Frequent Pain Assessment and Oral Care  Additional Recommendations (Limitations, Scope, Preferences): Treat the treatable but no CPR or intubation  Psycho-social/Spiritual:  Desire for further Chaplaincy support:no Additional Recommendations: Caregiving  Support/Resources and Education on Hospice  Prognosis:  Unable to determine, based on outcomes.  6 to 12 months or less would not be surprising based on decreasing functional status over the last year, frailty, chronic illness burden.  Discharge Planning:  Short-term rehab, requesting [facility], with outpatient palliative to service.  Possible need for long-term care versus home with hospice care in the future.       Primary Diagnoses: Present on Admission:  Failure to thrive in adult  Acute renal failure (HCC)  Encephalopathy   I have reviewed the medical  record, interviewed the patient and family, and examined the patient. The following aspects are pertinent.  Past Medical History:  Diagnosis Date   Arthritis    Dementia (HCC)    early stages   Diabetes mellitus    Eosinophilic colitis    GERD (gastroesophageal reflux disease)    HOH (hard of hearing)    Hypercholesterolemia    Hypertension    Social History   Socioeconomic History   Marital status: Married    Spouse name: Not on file   Number of children: Not on file   Years of education: Not on file   Highest education level: Not on  file  Occupational History   Not on file  Tobacco Use   Smoking status: Former    Packs/day: 3.00    Years: 30.00    Pack years: 90.00    Types: Cigarettes    Quit date: 10/11/1993    Years since quitting: 27.3   Smokeless tobacco: Former   Tobacco comments:    Quit approx 20 years ago  Building services engineer Use: Never used  Substance and Sexual Activity   Alcohol use: No    Alcohol/week: 0.0 standard drinks   Drug use: No   Sexual activity: Yes    Birth control/protection: None  Other Topics Concern   Not on file  Social History Narrative   Not on file   Social Determinants of Health   Financial Resource Strain: Not on file  Food Insecurity: Not on file  Transportation Needs: Not on file  Physical Activity: Not on file  Stress: Not on file  Social Connections: Not on file   Family History  Problem Relation Age of Onset   Arthritis Other    Cancer Other    Diabetes Other    Cancer Mother    Colon cancer Neg Hx    Scheduled Meds:  aspirin EC  81 mg Oral Daily   carbidopa-levodopa  1 tablet Oral TID   cilostazol  100 mg Oral Daily   ciprofloxacin  500 mg Oral BID   divalproex  500 mg Oral Daily   heparin  5,000 Units Subcutaneous Q8H   insulin aspart  0-5 Units Subcutaneous QHS   insulin aspart  0-9 Units Subcutaneous TID WC   vitamin B-12  100 mcg Oral q morning   Continuous Infusions:  sodium chloride 75 mL/hr at  02/25/21 1021   PRN Meds:.ALPRAZolam Medications Prior to Admission:  Prior to Admission medications   Medication Sig Start Date End Date Taking? Authorizing Provider  amLODipine (NORVASC) 2.5 MG tablet Take 2.5 mg by mouth every morning. 12/11/17  Yes Provider, Historical, Kramer  aspirin EC 81 MG tablet Take 81 mg by mouth daily.    Yes Provider, Historical, Kramer  atorvastatin (LIPITOR) 40 MG tablet Take 40 mg by mouth at bedtime. 12/26/20  Yes Provider, Historical, Kramer  bismuth subsalicylate (PEPTO BISMOL) 262 MG chewable tablet Chew 262 mg by mouth daily as needed for diarrhea or loose stools.   Yes Provider, Historical, Kramer  carbidopa-levodopa (SINEMET IR) 25-100 MG tablet Take 1 tablet by mouth 3 (three) times daily. 12/06/20  Yes Provider, Historical, Kramer  cilostazol (PLETAL) 100 MG tablet TAKE 1 TABLET(100 MG) BY MOUTH TWICE DAILY BEFORE A MEAL Patient taking differently: Take 100 mg by mouth daily. 03/26/20  Yes Maeola Harman, Kramer  Cyanocobalamin (VITAMIN B12 PO) Take 1 tablet by mouth every morning.   Yes Provider, Historical, Kramer  divalproex (DEPAKOTE) 250 MG DR tablet Take 250 mg by mouth 3 (three) times daily. 01/04/21  Yes Provider, Historical, Kramer  donepezil (ARICEPT) 10 MG tablet Take 10 mg by mouth at bedtime.  07/16/17  Yes Provider, Historical, Kramer  fish oil-omega-3 fatty acids 1000 MG capsule Take 1 g by mouth 2 (two) times daily.    Yes Provider, Historical, Kramer  fluticasone (FLONASE) 50 MCG/ACT nasal spray Place 2 sprays into both nostrils daily as needed for allergies.   Yes Provider, Historical, Kramer  insulin aspart (NOVOLOG) 100 UNIT/ML injection Inject 24-36 Units into the skin See admin instructions. 24 units at lunch time, and 36 units at dinner *  Do not take if levels are under 150   Yes Provider, Historical, Kramer  insulin detemir (LEVEMIR) 100 UNIT/ML injection Inject 36-55 Units into the skin 2 (two) times daily. 55 units each morning, and 36 units at bedtime   Yes Provider,  Historical, Kramer  lisinopril (PRINIVIL,ZESTRIL) 40 MG tablet Take 20 mg by mouth daily.   Yes Provider, Historical, Kramer  loperamide (IMODIUM A-D) 2 MG tablet Take 2 mg by mouth as needed for diarrhea or loose stools.   Yes Provider, Historical, Kramer  loratadine (CLARITIN) 10 MG tablet Take 10 mg by mouth daily.   Yes Provider, Historical, Kramer  meloxicam (MOBIC) 7.5 MG tablet TAKE 1 TABLET(7.5 MG) BY MOUTH DAILY Patient taking differently: Take 7.5 mg by mouth every morning. 09/03/20  Yes Vickki Hearing, Kramer  memantine (NAMENDA) 5 MG tablet Take 5 mg by mouth 2 (two) times daily.   Yes Provider, Historical, Kramer  Multiple Vitamin (MULTIVITAMIN WITH MINERALS) TABS tablet Take 1 tablet by mouth every morning.   Yes Provider, Historical, Kramer  niacin (SLO-NIACIN) 500 MG tablet Take 500 mg by mouth at bedtime.   Yes Provider, Historical, Kramer  Omega-3 Fatty Acids (FISH OIL) 1000 MG CAPS Take 1 capsule by mouth daily.   Yes Provider, Historical, Kramer  pantoprazole (PROTONIX) 40 MG tablet Take 40 mg by mouth daily.   Yes Provider, Historical, Kramer  QUEtiapine (SEROQUEL) 25 MG tablet Take 25 mg by mouth at bedtime. 12/05/20  Yes Provider, Historical, Kramer  Semaglutide 3 MG TABS Take 3 mg by mouth every morning.   Yes Provider, Historical, Kramer   No Known Allergies Review of Systems  Unable to perform ROS: Dementia   Physical Exam Vitals and nursing note reviewed.  Constitutional:      General: He is not in acute distress.    Appearance: He is ill-appearing.  HENT:     Mouth/Throat:     Mouth: Mucous membranes are moist.  Cardiovascular:     Rate and Rhythm: Normal rate.  Pulmonary:     Effort: Pulmonary effort is normal. No respiratory distress.  Musculoskeletal:        General: No swelling.  Skin:    General: Skin is warm and dry.  Neurological:     Mental Status: He is alert.     Comments: Known dementia   Psychiatric:     Comments: Calm and cooperative     Vital Signs: BP 124/66 (BP Location: Left  Arm)   Pulse 99   Temp 98 F (36.7 C) (Oral)   Resp 18   Ht 5\' 10"  (1.778 m)   Wt 83 kg   SpO2 99%   BMI 26.26 kg/m  Pain Scale: 0-10   Pain Score: Asleep   SpO2: SpO2: 99 % O2 Device:SpO2: 99 % O2 Flow Rate: .O2 Flow Rate (L/min): 0 L/min  IO: Intake/output summary:  Intake/Output Summary (Last 24 hours) at 02/25/2021 1344 Last data filed at 02/25/2021 1137 Gross per 24 hour  Intake 1563.98 ml  Output 1300 ml  Net 263.98 ml    LBM: Last BM Date: 02/24/21 Baseline Weight: Weight: 83 kg Most recent weight: Weight: 83 kg     Palliative Assessment/Data:   Flowsheet Rows    Flowsheet Row Most Recent Value  Intake Tab   Referral Department Hospitalist  Unit at Time of Referral Cardiac/Telemetry Unit  Palliative Care Primary Diagnosis Neurology  Date Notified 02/21/21  Palliative Care Type New Palliative care  Reason for referral Clarify  Goals of Care  Date of Admission 02/21/21  Date first seen by Palliative Care 02/25/21  # of days Palliative referral response time 4 Day(s)  # of days IP prior to Palliative referral 0  Clinical Assessment   Palliative Performance Scale Score 30%  Pain Max last 24 hours Not able to report  Pain Min Last 24 hours Not able to report  Dyspnea Max Last 24 Hours Not able to report  Dyspnea Min Last 24 hours Not able to report  Psychosocial & Spiritual Assessment   Palliative Care Outcomes        Time In: 1330  Time Out: 1440  Time Total: 70 minutes  Greater than 50%  of this time was spent counseling and coordinating care related to the above assessment and plan.  Signed by: Katheran Awe, NP   Please contact Palliative Medicine Team phone at [phone] for questions and concerns.  For individual provider: See Loretha Stapler

## 2021-02-26 DIAGNOSIS — F0281 Dementia in other diseases classified elsewhere with behavioral disturbance: Secondary | ICD-10-CM | POA: Diagnosis not present

## 2021-02-26 DIAGNOSIS — N179 Acute kidney failure, unspecified: Secondary | ICD-10-CM | POA: Diagnosis not present

## 2021-02-26 DIAGNOSIS — N390 Urinary tract infection, site not specified: Secondary | ICD-10-CM | POA: Diagnosis not present

## 2021-02-26 DIAGNOSIS — E1169 Type 2 diabetes mellitus with other specified complication: Secondary | ICD-10-CM | POA: Diagnosis not present

## 2021-02-26 LAB — BASIC METABOLIC PANEL
Anion gap: 8 (ref 5–15)
BUN: 17 mg/dL (ref 8–23)
CO2: 24 mmol/L (ref 22–32)
Calcium: 9.3 mg/dL (ref 8.9–10.3)
Chloride: 109 mmol/L (ref 98–111)
Creatinine, Ser: 1.04 mg/dL (ref 0.61–1.24)
GFR, Estimated: >60 mL/min (ref >60)
Glucose, Bld: 130 mg/dL — ABNORMAL HIGH (ref 70–99)
Potassium: 4.5 mmol/L (ref 3.5–5.1)
Sodium: 141 mmol/L (ref 135–145)

## 2021-02-26 LAB — CBC
HCT: 34.9 % — ABNORMAL LOW (ref 39.0–52.0)
Hemoglobin: 10.9 g/dL — ABNORMAL LOW (ref 13.0–17.0)
MCH: 28.2 pg (ref 26.0–34.0)
MCHC: 31.2 g/dL (ref 30.0–36.0)
MCV: 90.4 fL (ref 80.0–100.0)
Platelets: 99 10*3/uL — ABNORMAL LOW (ref 150–400)
RBC: 3.86 MIL/uL — ABNORMAL LOW (ref 4.22–5.81)
RDW: 14.3 % (ref 11.5–15.5)
WBC: 5.9 10*3/uL (ref 4.0–10.5)
nRBC: 0 % (ref 0.0–0.2)

## 2021-02-26 LAB — GLUCOSE, CAPILLARY
Glucose-Capillary: 105 mg/dL — ABNORMAL HIGH (ref 70–99)
Glucose-Capillary: 121 mg/dL — ABNORMAL HIGH (ref 70–99)
Glucose-Capillary: 120 mg/dL — ABNORMAL HIGH (ref 70–99)
Glucose-Capillary: 138 mg/dL — ABNORMAL HIGH (ref 70–99)

## 2021-02-26 MED ORDER — CIPROFLOXACIN HCL 500 MG PO TABS: 500.0000 mg | ORAL_TABLET | Freq: Two times a day (BID) | ORAL | 0 refills | Status: AC

## 2021-02-26 MED ORDER — DIVALPROEX SODIUM 250 MG PO DR TAB: 250.0000 mg | DELAYED_RELEASE_TABLET | Freq: Two times a day (BID) | ORAL | 2 refills | Status: DC

## 2021-02-26 MED ORDER — LACTINEX PO CHEW: 1.0000 | CHEWABLE_TABLET | Freq: Three times a day (TID) | ORAL | 0 refills | Status: AC

## 2021-02-26 NOTE — Care Management Important Message (Signed)
Important Message  Patient Details  Name: [name] MRN: [id number] Date of Birth: [birth year]   Medicare Important Message Given:  Yes     Corey Harold 02/26/2021, 11:59 AM

## 2021-02-26 NOTE — Discharge Summary (Signed)
Physician Discharge Summary [facility]    Patient: [name]                   Admit date: 02/21/2021   DOB: 07/17/1930             Discharge date:02/26/2021/11:35 AM TDS:[id number]                          PCP: [name], FNP  Disposition:  SNF Recommendations for Outpatient Follow-up:   Follow up: With PCP and palliative care within next 2 - 3 days Note: patient's home medication was significant modified as patient has been declining mentally and physically  Discharge Condition: Stable   Code Status:   Code Status: DNR  Diet recommendation: Regular healthy diet   Discharge Diagnoses:    Active Problems:   Acute renal failure (HCC)   Diabetes (HCC)   Failure to thrive in adult   Dementia due to Parkinson's disease with behavioral disturbance (HCC)   Parkinson disease (HCC)   Dementia associated with other underlying disease with behavioral disturbance (HCC)   Acute lower UTI   Pressure injury of skin   Encephalopathy   History of Present Illness/ Hospital Course [name] Summary:     [name]  is a 87 y.o. male, past medical history of diabetes mellitus, insulin-dependent, dementia, GERD, Parkinson disease, hypertension, patient was brought to ED by his wife secondary to change in his mental status. She reports that the patient's agitation confusion is progressively getting worse especially over past week..  Patient also has had episodes of hypoglycemia and falls.   patient had wounds to the left buttocks, and left foot, wife report usually patient using walker and wheelchair, but he is with increased falls recently. ED his work-up was significant for creatinine of 1.48, platelet of 114 K, hemoglobin of 10.7, urinalysis is positive with significant pyuria and positive leukocyte esterase, his CT head with stable less than 3 mm chronic bilateral subdural hematomas or dural thickening, with no acute hemorrhage.        Failure to thrive/with  encephalopathy -Not much improvement noted -Progressing to severe debility, worsening mentation (poor interaction, poor p.o. intake) -As mentation improves, will encourage more p.o. intake -No new finding on repeat CT of the head or EEG     -Encephalopathy likely metabolic worsening by UTI-and underlying Parkinson dementia   -Continue to treat underlying cause of infection UTI, -Encourage p.o. intake, -We will consult dietitian -Started PT OT, patient not ambulating, recommending SNF placement   Acute metabolic encephalopathy Patient is less lethargic, more awake this morning pleasantly confused -All sedative medications been discontinued, -CT of the head was repeated today 02/14/2021 reported stable nonacute bilateral subdural hematoma without cerebral mass affect -Repeating EEG .>>>  No seizure or abnormal waveform has been identified -Cutting down his Keppra from 750 to 500 mg PO daily (250 twice daily) -Complicated by UTI, progressive Parkinson dementia, failure to thrive Poor p.o. intake leading to dehydration, malnutrition     UTI -Urine culture greater than 100 K staph epididymis, pansensitive (questionable contaminant) -Patient has been on IV Rocephin, due to sensitivity will be switched to p.o. ciprofloxacin  02/25/2021 -No signs of sirs or sepsis, afebrile, mildly hypertensive     Parkinson disease-Parkinson dementia -With encephalopathy, some improvement noted today -We will continue with home medications include Sinemet -It seems that he is progressively declining with debility, poor mentation, No visible tremors at this time  Dementia-severe advanced -Continue with home medication include Depakote, Aricept and Namenda -We will continue to hold Seroquel given altered mental status -Reducing Depakote, anticipating holding Aricept and Namenda   Diabetes mellitus II -Patient is on Lantus at home, resumed at lower dose due to poor p.o. intake -Checking CBG QA CHS,  SSI coverage -will hold semaglutide   Hypertension --hypotensive -Discontinued home medication of Norvasc and lisinopril Fluid resuscitation, blood pressure has stabilized    Hyperlipidemia -Discontinuing statins as the risk outweighs the benefit at his age and continued decline   GERD -Holding PPI in face of antibiotics to reduce the chance of adverse effect including C. difficile   CKD stage III  -Worsening kidney function in the face of poor p.o. intake -Reinitiating IV fluid -Encouraging p.o. intake -Monitoring BUN/creatinine closely, avoiding nephrotoxins, -BUN 27, creatinine 1.57 >>> 1.04   PVD -continue with cilostazol and statin -- D/ced  -Stable   Subdural hematoma -CT head showing stable less than 3 mm chronic bilateral subdural hematomas or dural thickening. No acute hemorrhage.  -Continue to monitor -Repeat CT head 02/24/2021 revealed bilateral unchanged subdural hematoma, no new changes         Skin Assessment: I have examined the patient's skin and I agree with the wound assessment as performed by wound care team As outlined belowe: Pressure Injury 02/21/21 Buttocks Left;Medial Stage 2 -  Partial thickness loss of dermis presenting as a shallow open injury with a red, pink wound bed without slough. nickel sized, pink wound base, no drainage noted (Active)  02/21/21 1845  Location: Buttocks  Location Orientation: Left;Medial  Staging: Stage 2 -  Partial thickness loss of dermis presenting as a shallow open injury with a red, pink wound bed without slough.  Wound Description (Comments): nickel sized, pink wound base, no drainage noted  Present on Admission: Yes     Pressure Injury 02/21/21 Buttocks Right;Medial Stage 2 -  Partial thickness loss of dermis presenting as a shallow open injury with a red, pink wound bed without slough. pencil eraser sized, pink base, no drainage (Active)  02/21/21 1845  Location: Buttocks  Location Orientation: Right;Medial   Staging: Stage 2 -  Partial thickness loss of dermis presenting as a shallow open injury with a red, pink wound bed without slough.  Wound Description (Comments): pencil eraser sized, pink base, no drainage  Present on Admission: Yes    Ethics: Due to multiple comorbidities status decline, physically and mentally-prognosis remains poor, CODE STATUS remains DNR/DNI -currently we are recommending comfort care measures Possible hospice Palliative care was consulted, discussed with the patient wife also, she is agreeable to proceed with SNF, if continued decline hospice.   Severe debility -patient is now ambulating, status post evaluation by PT OT, recommending SNF   ----------------------------------------------------------------------------------------------------------------------------------------------- Cultures; Urine Culture  >>> NGT     Antimicrobials: IV Rocephin >>> switch to p.o. Cipro     Consultants: PT/OT....     ------------------------------------------------------------------------------------------------------------------------------------------------   DVT prophylaxis:  SCD/Compression stockings and Heparin SQ Code Status:   Code Status: DNR Code Status DNR confirmed by wife   Family Communication: Discussed with his wife over the phone She is agreeable to palliative care consult, if no improvement hospice She is agreeable to SNF placement     The above findings and plan of care has been discussed with patient (wife)  in detail, they expressed understanding and agreement of above. -Advance care planning has been discussed.     Dispo: The patient is from: Home  Anticipated d/c is to: SNF       Discharge Instructions:   Discharge Instructions     Activity as tolerated - No restrictions   Complete by: As directed    Diet - low sodium heart healthy   Complete by: As directed    Discharge instructions   Complete by: As directed     Follow-up with palliative care at facility, continue current modified medication   Discharge wound care:   Complete by: As directed    Per nursing staff, wound care daily   Increase activity slowly   Complete by: As directed         Medication List     STOP taking these medications    amLODipine 2.5 MG tablet Commonly known as: NORVASC   atorvastatin 40 MG tablet Commonly known as: LIPITOR   Fish Oil 1000 MG Caps   fish oil-omega-3 fatty acids 1000 MG capsule   lisinopril 40 MG tablet Commonly known as: ZESTRIL   loperamide 2 MG tablet Commonly known as: IMODIUM A-D   meloxicam 7.5 MG tablet Commonly known as: MOBIC   pantoprazole 40 MG tablet Commonly known as: PROTONIX   QUEtiapine 25 MG tablet Commonly known as: SEROQUEL       TAKE these medications    aspirin EC 81 MG tablet Take 81 mg by mouth daily.   bismuth subsalicylate 262 MG chewable tablet Commonly known as: PEPTO BISMOL Chew 262 mg by mouth daily as needed for diarrhea or loose stools.   carbidopa-levodopa 25-100 MG tablet Commonly known as: SINEMET IR Take 1 tablet by mouth 3 (three) times daily.   ciprofloxacin 500 MG tablet Commonly known as: CIPRO Take 1 tablet (500 mg total) by mouth 2 (two) times daily for 5 days.   divalproex 250 MG DR tablet Commonly known as: DEPAKOTE Take 1 tablet (250 mg total) by mouth 2 (two) times daily. What changed: when to take this   donepezil 10 MG tablet Commonly known as: ARICEPT Take 10 mg by mouth at bedtime.   fluticasone 50 MCG/ACT nasal spray Commonly known as: FLONASE Place 2 sprays into both nostrils daily as needed for allergies.   insulin aspart 100 UNIT/ML injection Commonly known as: novoLOG Inject 24-36 Units into the skin See admin instructions. 24 units at lunch time, and 36 units at dinner *Do not take if levels are under 150   insulin detemir 100 UNIT/ML injection Commonly known as: LEVEMIR Inject 36-55 Units into the skin  2 (two) times daily. 55 units each morning, and 36 units at bedtime   lactobacillus acidophilus & bulgar chewable tablet Chew 1 tablet by mouth 3 (three) times daily with meals for 5 days.   loratadine 10 MG tablet Commonly known as: CLARITIN Take 10 mg by mouth daily.   memantine 5 MG tablet Commonly known as: NAMENDA Take 5 mg by mouth 2 (two) times daily.   multivitamin with minerals Tabs tablet Take 1 tablet by mouth every morning.   niacin 500 MG tablet Commonly known as: SLO-NIACIN Take 500 mg by mouth at bedtime.   Semaglutide 3 MG Tabs Take 3 mg by mouth every morning.   VITAMIN B12 PO Take 1 tablet by mouth every morning.       ASK your doctor about these medications    cilostazol 100 MG tablet Commonly known as: PLETAL TAKE 1 TABLET(100 MG) BY MOUTH TWICE DAILY BEFORE A MEAL        Contact information for after-discharge care  Destination     [facility] .   Service: Skilled Nursing Contact information: [address] [place] [address] [phone]                    No Known Allergies   Procedures /Studies:   CT HEAD WO CONTRAST  Result Date: 02/24/2021 CLINICAL DATA:  Follow-up subdural collection EXAM: CT HEAD WITHOUT CONTRAST TECHNIQUE: Contiguous axial images were obtained from the base of the skull through the vertex without intravenous contrast. COMPARISON:  Three days ago FINDINGS: Brain: Bilateral intermediate to low-density subdural collection/thickening measuring up to 3 mm in thickness and not causing any mass effect on the atrophic brain. No high-density/acute hemorrhage. No evidence of infarct, mass, or hydrocephalus. Generalized atrophy and chronic small vessel ischemia Vascular: No hyperdense vessel or unexpected calcification. Skull: Normal. Negative for fracture or focal lesion. Sinuses/Orbits: No acute finding. Mucosal thickening in the left sphenoid sinus. IMPRESSION:  Stable nonacute bilateral subdural hematoma without cerebral mass effect. No new abnormality. Electronically Signed   By: Marnee Spring M.D.   On: 02/24/2021 10:15   CT Head Wo Contrast  Result Date: 02/21/2021 CLINICAL DATA:  Dementia, Parkinson's disease, hypoglycemia, altered level of consciousness EXAM: CT HEAD WITHOUT CONTRAST TECHNIQUE: Contiguous axial images were obtained from the base of the skull through the vertex without intravenous contrast. COMPARISON:  06/05/2020 FINDINGS: Brain: No acute infarct or hemorrhage. Chronic small vessel ischemic changes are seen within the periventricular white matter. Lateral ventricles and midline structures are unremarkable. Stable bilateral less than 3 mm chronic subdural hematomas are again noted along the convexities. No acute extra-axial fluid collections. No mass effect. Vascular: No hyperdense vessel or unexpected calcification. Skull: Normal. Negative for fracture or focal lesion. Sinuses/Orbits: Polypoid mucosal thickening left sphenoid sinus. Remaining sinuses are clear. Chronic left mastoid effusion. Other: None. IMPRESSION: 1. Stable less than 3 mm chronic bilateral subdural hematomas or dural thickening. No acute hemorrhage. 2. Chronic small vessel ischemic changes.  No acute infarct. Electronically Signed   By: Sharlet Salina M.D.   On: 02/21/2021 15:56   DG Chest Portable 1 View  Result Date: 02/21/2021 CLINICAL DATA:  Altered. EXAM: PORTABLE CHEST 1 VIEW COMPARISON:  Radiograph 12/31/2020 FINDINGS: Low lung volumes. Stable heart size and mediastinal contours for technique. Aortic atherosclerosis. Streaky retrocardiac atelectasis. No confluent airspace disease. No pulmonary edema. No pleural effusion or pneumothorax. No acute osseous abnormalities are seen. IMPRESSION: Low lung volumes with retrocardiac atelectasis. Electronically Signed   By: Narda Rutherford M.D.   On: 02/21/2021 17:15   EEG adult  Result Date: 02/25/2021 Charlsie Quest,  MD     02/25/2021  3:00 PM Patient Name: Kahlen Morais MRN: [id number] Epilepsy Attending: Charlsie Quest Referring Physician/Provider: Dr Nevin Bloodgood Date: 02/25/2021 Duration: 23.50 mins Patient history: 87 year old male with altered mental status.  EEG to evaluate for seizures. Level of alertness: Awake AEDs during EEG study: None Technical aspects: This EEG study was done with scalp electrodes positioned according to the 10-20 International system of electrode placement. Electrical activity was acquired at a sampling rate of 500Hz  and reviewed with a high frequency filter of 70Hz  and a low frequency filter of 1Hz . EEG data were recorded continuously and digitally stored. Description: The posterior dominant rhythm consists of 8 Hz activity of moderate voltage (25-35 uV) seen predominantly in posterior head regions, symmetric and reactive to eye opening and eye closing. Drowsiness was characterized by attenuation of the posterior background rhythm.  Patient was noted to have bilateral upper extremity tremors intermittently throughout the EEG.  Concomitant EEG before, during and after the event did not show any EEG changes suggest seizure.  Hyperventilation and photic stimulation were not performed.   IMPRESSION: This study is within normal limits. No seizures or epileptiform discharges were seen throughout the recording. Patient was noted to have marked upper extremity tremors intermittently throughout the EEG without concomitant EEG change.  These episodes were not epileptic. Priyanka Annabelle Harman    Subjective:   Patient was seen and examined 02/26/2021, 11:35 AM Patient stable today. No acute distress.  No issues overnight Stable for discharge.  Discharge Exam:    Vitals:   02/25/21 0557 02/25/21 1323 02/25/21 2107 02/26/21 0517  BP: 103/60 124/66 (!) 142/63 (!) 124/59  Pulse: 83 99 93 (!) 111  Resp: 18 18 20 16   Temp: 98.9 F (37.2 C) 98 F (36.7 C) 98.1 F (36.7 C) 97.9 F (36.6 C)   TempSrc:  Oral  Axillary  SpO2: 100% 99% 100% 93%  Weight:      Height:        General: Pt lying comfortably in bed & appears in no obvious distress. Cardiovascular: S1 & S2 heard, RRR, S1/S2 +. No murmurs, rubs, gallops or clicks. No JVD or pedal edema. Respiratory: Clear to auscultation without wheezing, rhonchi or crackles. No increased work of breathing. Abdominal:  Non-distended, non-tender & soft. No organomegaly or masses appreciated. Normal bowel sounds heard. CNS: Alert and oriented. No focal deficits. Extremities: no edema, no cyanosis Pressure Injury 02/21/21 Buttocks Left;Medial Stage 2 -  Partial thickness loss of dermis presenting as a shallow open injury with a red, pink wound bed without slough. nickel sized, pink wound base, no drainage noted (Active)  02/21/21 1845  Location: Buttocks  Location Orientation: Left;Medial  Staging: Stage 2 -  Partial thickness loss of dermis presenting as a shallow open injury with a red, pink wound bed without slough.  Wound Description (Comments): nickel sized, pink wound base, no drainage noted  Present on Admission: Yes     Pressure Injury 02/21/21 Buttocks Right;Medial Stage 2 -  Partial thickness loss of dermis presenting as a shallow open injury with a red, pink wound bed without slough. pencil eraser sized, pink base, no drainage (Active)  02/21/21 1845  Location: Buttocks  Location Orientation: Right;Medial  Staging: Stage 2 -  Partial thickness loss of dermis presenting as a shallow open injury with a red, pink wound bed without slough.  Wound Description (Comments): pencil eraser sized, pink base, no drainage  Present on Admission: Yes      The results of significant diagnostics from this hospitalization (including imaging, microbiology, ancillary and laboratory) are listed below for reference.      Microbiology:   Recent Results (from the past 240 hour(s))  Urine Culture     Status: Abnormal   Collection Time:  02/21/21  1:55 PM   Specimen: In/Out Cath Urine  Result Value Ref Range Status   Specimen Description   Final    IN/OUT CATH URINE Performed at [facility], [address] [place], [place]    Special Requests   Final    NONE Performed at [facility], [address], [place], [place]    Culture >=100,000 COLONIES/mL STAPHYLOCOCCUS EPIDERMIDIS (A)  Final   Report Status 02/24/2021 FINAL  Final   Organism ID, Bacteria STAPHYLOCOCCUS EPIDERMIDIS (A)  Final      Susceptibility   Staphylococcus  epidermidis - MIC*    CIPROFLOXACIN <=0.5 SENSITIVE Sensitive     GENTAMICIN <=0.5 SENSITIVE Sensitive     NITROFURANTOIN <=16 SENSITIVE Sensitive     OXACILLIN <=0.25 SENSITIVE Sensitive     TETRACYCLINE <=1 SENSITIVE Sensitive     VANCOMYCIN <=0.5 SENSITIVE Sensitive     TRIMETH/SULFA <=10 SENSITIVE Sensitive     CLINDAMYCIN <=0.25 SENSITIVE Sensitive     RIFAMPIN <=0.5 SENSITIVE Sensitive     Inducible Clindamycin NEGATIVE Sensitive     * >=100,000 COLONIES/mL STAPHYLOCOCCUS EPIDERMIDIS  Resp Panel by RT-PCR (Flu A&B, Covid) Nasopharyngeal Swab     Status: None   Collection Time: 02/21/21  1:56 PM   Specimen: Nasopharyngeal Swab; Nasopharyngeal(NP) swabs in vial transport medium  Result Value Ref Range Status   SARS Coronavirus 2 by RT PCR NEGATIVE NEGATIVE Final    Comment: (NOTE) SARS-CoV-2 target nucleic acids are NOT DETECTED.  The SARS-CoV-2 RNA is generally detectable in upper respiratory specimens during the acute phase of infection. The lowest concentration of SARS-CoV-2 viral copies this assay can detect is 138 copies/mL. A negative result does not preclude SARS-Cov-2 infection and should not be used as the sole basis for treatment or other patient management decisions. A negative result may occur with  improper specimen collection/handling, submission of specimen other than nasopharyngeal swab, presence of viral mutation(s) within the areas targeted by  this assay, and inadequate number of viral copies(<138 copies/mL). A negative result must be combined with clinical observations, patient history, and epidemiological information. The expected result is Negative.  Fact Sheet for Patients:  BloggerCourse.comhttps://www.fda.gov/media/152166/download  Fact Sheet for Healthcare Providers:  SeriousBroker.ithttps://www.fda.gov/media/152162/download  This test is no t yet approved or cleared by the [place] FDA and  has been authorized for detection and/or diagnosis of SARS-CoV-2 by FDA under an Emergency Use Authorization (EUA). This EUA will remain  in effect (meaning this test can be used) for the duration of the COVID-19 declaration under Section 564(b)(1) of the Act, 21 U.S.C.section 360bbb-3(b)(1), unless the authorization is terminated  or revoked sooner.       Influenza A by PCR NEGATIVE NEGATIVE Final   Influenza B by PCR NEGATIVE NEGATIVE Final    Comment: (NOTE) The Xpert Xpress SARS-CoV-2/FLU/RSV plus assay is intended as an aid in the diagnosis of influenza from Nasopharyngeal swab specimens and should not be used as a sole basis for treatment. Nasal washings and aspirates are unacceptable for Xpert Xpress SARS-CoV-2/FLU/RSV testing.  Fact Sheet for Patients: BloggerCourse.comhttps://www.fda.gov/media/152166/download  Fact Sheet for Healthcare Providers: SeriousBroker.ithttps://www.fda.gov/media/152162/download  This test is not yet approved or cleared by the [place] FDA and has been authorized for detection and/or diagnosis of SARS-CoV-2 by FDA under an Emergency Use Authorization (EUA). This EUA will remain in effect (meaning this test can be used) for the duration of the COVID-19 declaration under Section 564(b)(1) of the Act, 21 U.S.C. section 360bbb-3(b)(1), unless the authorization is terminated or revoked.  Performed at [facility], [address] [place], [place] [address]      Labs:   CBC: Recent Labs  Lab 02/21/21 1335 02/22/21 0457 02/24/21 0640  02/25/21 0436 02/26/21 0447  WBC 5.2 6.0 6.1 6.0 5.9  NEUTROABS 2.6  --   --   --   --   HGB 10.7* 11.6* 12.2* 10.6* 10.9*  HCT 33.2* 36.3* 38.8* 32.6* 34.9*  MCV 89.2 88.8 88.2 87.9 90.4  PLT 114* 112* 123* 103* 99*   Basic Metabolic Panel: Recent Labs  Lab 02/21/21 1335 02/22/21  1610 02/24/21 0640 02/25/21 0436 02/26/21 0447  NA 137 138 137 136 141  K 4.3 4.2 4.2 3.9 4.5  CL 105 107 104 107 109  CO2 GLUCOSE 167* 112* 115* 156* 130*  BUN 27* 17  CREATININE 1.48* 1.20 1.38* 1.57* 1.04  CALCIUM 8.8* 9.0 9.1 7.9* 9.3   Liver Function Tests: Recent Labs  Lab 02/21/21 1335  AST 15  ALT 8  ALKPHOS 68  BILITOT 0.6  PROT 7.0  ALBUMIN 3.6   BNP (last 3 results) No results for input(s): BNP in the last 8760 hours. Cardiac Enzymes: No results for input(s): CKTOTAL, CKMB, CKMBINDEX, TROPONINI in the last 168 hours. CBG: Recent Labs  Lab 02/25/21 0740 02/25/21 1116 02/25/21 1620 02/25/21 2109 02/26/21 0813  GLUCAP 139* 159* 155* 165* 105*   Hgb A1c No results for input(s): HGBA1C in the last 72 hours. Lipid Profile No results for input(s): CHOL, HDL, LDLCALC, TRIG, CHOLHDL, LDLDIRECT in the last 72 hours. Thyroid function studies No results for input(s): TSH, T4TOTAL, T3FREE, THYROIDAB in the last 72 hours.  Invalid input(s): FREET3 Anemia work up No results for input(s): VITAMINB12, FOLATE, FERRITIN, TIBC, IRON, RETICCTPCT in the last 72 hours. Urinalysis    Component Value Date/Time   COLORURINE YELLOW 02/25/2021 0822   APPEARANCEUR HAZY (A) 02/25/2021 0822   LABSPEC 1.009 02/25/2021 0822   PHURINE 6.0 02/25/2021 0822   GLUCOSEU NEGATIVE 02/25/2021 0822   HGBUR SMALL (A) 02/25/2021 0822   BILIRUBINUR NEGATIVE 02/25/2021 0822   KETONESUR NEGATIVE 02/25/2021 0822   PROTEINUR NEGATIVE 02/25/2021 0822   UROBILINOGEN 0.2 03/26/2015 1325   NITRITE NEGATIVE 02/25/2021 0822   LEUKOCYTESUR LARGE (A) 02/25/2021 0822   Pressure Injury  02/21/21 Buttocks Left;Medial Stage 2 -  Partial thickness loss of dermis presenting as a shallow open injury with a red, pink wound bed without slough. nickel sized, pink wound base, no drainage noted (Active)  02/21/21 1845  Location: Buttocks  Location Orientation: Left;Medial  Staging: Stage 2 -  Partial thickness loss of dermis presenting as a shallow open injury with a red, pink wound bed without slough.  Wound Description (Comments): nickel sized, pink wound base, no drainage noted  Present on Admission: Yes     Pressure Injury 02/21/21 Buttocks Right;Medial Stage 2 -  Partial thickness loss of dermis presenting as a shallow open injury with a red, pink wound bed without slough. pencil eraser sized, pink base, no drainage (Active)  02/21/21 1845  Location: Buttocks  Location Orientation: Right;Medial  Staging: Stage 2 -  Partial thickness loss of dermis presenting as a shallow open injury with a red, pink wound bed without slough.  Wound Description (Comments): pencil eraser sized, pink base, no drainage  Present on Admission: Yes       Time coordinating discharge: Over 455 minutes  SIGNED: Kendell Bane, MD, FACP, FHM. [facility],  Please use [email or web address] to Page If 7PM-7AM, please contact night-coverage [email or web address] 02/26/2021, 11:35 AM

## 2021-02-26 NOTE — TOC Transition Note (Signed)
Transition of Care [facility]) - CM/SW Discharge Note   Patient Details  Name: [name] MRN: [id number] Date of Birth: 02-Dec-1929  [facility] CM/SW Contact:  Karn Cassis, LCSW Phone Number: 02/26/2021, 12:57 PM   Clinical Narrative:  Pt d/c today to [facility]. Debbie at facility aware and reports authorization received. D/C summary sent. Pt's wife also aware and agreeable to d/c to [facility] today. She requests transport via [facility]. RN given number to call report.      Final next level of care: Skilled Nursing Facility Barriers to Discharge: Barriers Resolved   Patient Goals and CMS Choice Patient states their goals for this hospitalization and ongoing recovery are:: rehab with SNF CMS Medicare.gov Compare Post Acute Care list provided to:: Patient Choice offered to / list presented to : Patient  Discharge Placement              Patient chooses bed at: Other - please specify in the comment section below: ([facility]) Patient to be transferred to facility by: [facility] Name of family member notified: Malachi Bonds- wife Patient and family notified of of transfer: 02/26/21  Discharge Plan and Services                DME Arranged: N/A                    Social Determinants of Health (SDOH) Interventions     Readmission Risk Interventions No flowsheet data found.

## 2021-02-26 NOTE — Progress Notes (Signed)
Called wife to give discharge paperwork/information she verbalized understanding. Packet ready at desk. Waiting to hear from Social worker when bed is ready.

## 2021-02-26 NOTE — Plan of Care (Signed)

## 2021-02-26 NOTE — Progress Notes (Signed)
Called report to [facility] RN with [employer]. Awaiting EMS to arrive to transfer patient. Will continue to monitor patient.

## 2021-02-27 DIAGNOSIS — N390 Urinary tract infection, site not specified: Secondary | ICD-10-CM | POA: Diagnosis not present

## 2021-02-27 DIAGNOSIS — N171 Acute kidney failure with acute cortical necrosis: Secondary | ICD-10-CM

## 2021-02-27 DIAGNOSIS — F0281 Dementia in other diseases classified elsewhere with behavioral disturbance: Secondary | ICD-10-CM | POA: Diagnosis not present

## 2021-02-27 DIAGNOSIS — E1169 Type 2 diabetes mellitus with other specified complication: Secondary | ICD-10-CM | POA: Diagnosis not present

## 2021-02-27 LAB — BASIC METABOLIC PANEL
Anion gap: 8 (ref 5–15)
BUN: 13 mg/dL (ref 8–23)
CO2: 22 mmol/L (ref 22–32)
Calcium: 9 mg/dL (ref 8.9–10.3)
Chloride: 105 mmol/L (ref 98–111)
Creatinine, Ser: 0.9 mg/dL (ref 0.61–1.24)
GFR, Estimated: >60 mL/min (ref >60)
Glucose, Bld: 117 mg/dL — ABNORMAL HIGH (ref 70–99)
Potassium: 3.9 mmol/L (ref 3.5–5.1)
Sodium: 135 mmol/L (ref 135–145)

## 2021-02-27 LAB — CBC
HCT: 38.6 % — ABNORMAL LOW (ref 39.0–52.0)
Hemoglobin: 12.4 g/dL — ABNORMAL LOW (ref 13.0–17.0)
MCH: 28.2 pg (ref 26.0–34.0)
MCHC: 32.1 g/dL (ref 30.0–36.0)
MCV: 87.9 fL (ref 80.0–100.0)
Platelets: 100 10*3/uL — ABNORMAL LOW (ref 150–400)
RBC: 4.39 MIL/uL (ref 4.22–5.81)
RDW: 14.1 % (ref 11.5–15.5)
WBC: 6.3 10*3/uL (ref 4.0–10.5)
nRBC: 0 % (ref 0.0–0.2)

## 2021-02-27 LAB — GLUCOSE, CAPILLARY: Glucose-Capillary: 118 mg/dL — ABNORMAL HIGH (ref 70–99)

## 2021-02-27 NOTE — Progress Notes (Signed)
Nsg Discharge Note  Admit Date:  02/21/2021 Discharge date: 02/27/2021   Zared Knoth to be D/C'd Nursing Home per MD order.  AVS completed.  Copy for chart, and copy for patient signed, and dated. Patient/caregiver able to verbalize understanding. Wife was called via phone and notified pt was moved by EMS to [facility]. Pt belongings such as hearing aid, charger and snacks was sent with patient.  Discharge Medication: Allergies as of 02/27/2021   No Known Allergies      Medication List     STOP taking these medications    amLODipine 2.5 MG tablet Commonly known as: NORVASC   atorvastatin 40 MG tablet Commonly known as: LIPITOR   Fish Oil 1000 MG Caps   fish oil-omega-3 fatty acids 1000 MG capsule   lisinopril 40 MG tablet Commonly known as: ZESTRIL   loperamide 2 MG tablet Commonly known as: IMODIUM A-D   meloxicam 7.5 MG tablet Commonly known as: MOBIC   pantoprazole 40 MG tablet Commonly known as: PROTONIX   QUEtiapine 25 MG tablet Commonly known as: SEROQUEL       TAKE these medications    aspirin EC 81 MG tablet Take 81 mg by mouth daily.   bismuth subsalicylate 262 MG chewable tablet Commonly known as: PEPTO BISMOL Chew 262 mg by mouth daily as needed for diarrhea or loose stools.   carbidopa-levodopa 25-100 MG tablet Commonly known as: SINEMET IR Take 1 tablet by mouth 3 (three) times daily.   ciprofloxacin 500 MG tablet Commonly known as: CIPRO Take 1 tablet (500 mg total) by mouth 2 (two) times daily for 5 days.   divalproex 250 MG DR tablet Commonly known as: DEPAKOTE Take 1 tablet (250 mg total) by mouth 2 (two) times daily. What changed: when to take this   donepezil 10 MG tablet Commonly known as: ARICEPT Take 10 mg by mouth at bedtime.   fluticasone 50 MCG/ACT nasal spray Commonly known as: FLONASE Place 2 sprays into both nostrils daily as needed for allergies.   insulin aspart 100 UNIT/ML injection Commonly known as:  novoLOG Inject 24-36 Units into the skin See admin instructions. 24 units at lunch time, and 36 units at dinner *Do not take if levels are under 150   insulin detemir 100 UNIT/ML injection Commonly known as: LEVEMIR Inject 36-55 Units into the skin 2 (two) times daily. 55 units each morning, and 36 units at bedtime   lactobacillus acidophilus & bulgar chewable tablet Chew 1 tablet by mouth 3 (three) times daily with meals for 5 days.   loratadine 10 MG tablet Commonly known as: CLARITIN Take 10 mg by mouth daily.   memantine 5 MG tablet Commonly known as: NAMENDA Take 5 mg by mouth 2 (two) times daily.   multivitamin with minerals Tabs tablet Take 1 tablet by mouth every morning.   niacin 500 MG tablet Commonly known as: SLO-NIACIN Take 500 mg by mouth at bedtime.   Semaglutide 3 MG Tabs Take 3 mg by mouth every morning.   VITAMIN B12 PO Take 1 tablet by mouth every morning.       ASK your doctor about these medications    cilostazol 100 MG tablet Commonly known as: PLETAL TAKE 1 TABLET(100 MG) BY MOUTH TWICE DAILY BEFORE A MEAL               Discharge Care Instructions  (From admission, onward)           Start     Ordered  02/26/21 0000  Discharge wound care:       Comments: Per nursing staff, wound care daily   02/26/21 1134            Discharge Assessment: Vitals:   02/26/21 1948 02/27/21 0340  BP: (!) 127/57 117/63  Pulse: (!) 101 93  Resp: 19 19  Temp: 97.7 F (36.5 C) 98.2 F (36.8 C)  SpO2: 91% 100%   Skin clean, dry and intact without evidence of skin break down, no evidence of skin tears noted. IV catheter discontinued intact. Site without signs and symptoms of complications - no redness or edema noted at insertion site, patient denies c/o pain - only slight tenderness at site.  Dressing with slight pressure applied.  D/c Instructions-Education: Discharge instructions given to patient/family with verbalized understanding. D/c  education completed with patient/family including follow up instructions, medication list, d/c activities limitations if indicated, with other d/c instructions as indicated by MD - patient able to verbalize understanding, all questions fully answered. Patient instructed to return to ED, call 911, or call MD for any changes in condition.  Patient escorted via WC, and D/C home via private auto.  Demetrio Lapping, LPN 5/39/7673 4:19 AM

## 2021-02-27 NOTE — Discharge Summary (Signed)
Physician Discharge Summary [facility]    Patient: [name]                   Admit date: 02/21/2021   DOB: January 16, 1930             Discharge date:02/27/2021/9:07 AM ZOX:[id number]                          PCP: [name], FNP  Disposition:  SNF Recommendations for Outpatient Follow-up:   Follow up: With PCP and palliative care within next 2 - 3 days Note: patient's home medication was significant modified as patient has been declining mentally and physically  Discharge Condition: Stable   Code Status:   Code Status: DNR  Diet recommendation: Regular healthy diet    The patient was seen and examined this morning, remained somnolent.  No further changes a Ready to be discharged to SNF.    Discharge Diagnoses:    Active Problems:   Acute renal failure (HCC)   Diabetes (HCC)   Failure to thrive in adult   Dementia due to Parkinson's disease with behavioral disturbance (HCC)   Parkinson disease (HCC)   Dementia associated with other underlying disease with behavioral disturbance (HCC)   Acute lower UTI   Pressure injury of skin   Encephalopathy   History of Present Illness/ Hospital Course [name] Summary:     [name]  is a 87 y.o. male, past medical history of diabetes mellitus, insulin-dependent, dementia, GERD, Parkinson disease, hypertension, patient was brought to ED by his wife secondary to change in his mental status. She reports that the patient's agitation confusion is progressively getting worse especially over past week..  Patient also has had episodes of hypoglycemia and falls.   patient had wounds to the left buttocks, and left foot, wife report usually patient using walker and wheelchair, but he is with increased falls recently. ED his work-up was significant for creatinine of 1.48, platelet of 114 K, hemoglobin of 10.7, urinalysis is positive with significant pyuria and positive leukocyte esterase, his CT head with stable less  than 3 mm chronic bilateral subdural hematomas or dural thickening, with no acute hemorrhage.        Failure to thrive/with encephalopathy -Not much improvement noted -Progressing to severe debility, worsening mentation (poor interaction, poor p.o. intake) -As mentation improves, will encourage more p.o. intake -No new finding on repeat CT of the head or EEG     -Encephalopathy likely metabolic worsening by UTI-and underlying Parkinson dementia   -Continue to treat underlying cause of infection UTI, -Encourage p.o. intake, -We will consult dietitian -Started PT OT, patient not ambulating, recommending SNF placement   Acute metabolic encephalopathy Patient is less lethargic, more awake this morning pleasantly confused -All sedative medications been discontinued, -CT of the head was repeated today 02/14/2021 reported stable nonacute bilateral subdural hematoma without cerebral mass affect -Repeating EEG .>>>  No seizure or abnormal waveform has been identified -Cutting down his Keppra from 750 to 500 mg PO daily (250 twice daily) -Complicated by UTI, progressive Parkinson dementia, failure to thrive Poor p.o. intake leading to dehydration, malnutrition     UTI -Urine culture greater than 100 K staph epididymis, pansensitive (questionable contaminant) -Patient has been on IV Rocephin, due to sensitivity will be switched to p.o. ciprofloxacin  02/25/2021 -No signs of sirs or sepsis, afebrile, mildly hypertensive     Parkinson disease-Parkinson dementia -With encephalopathy, some improvement noted today -We  will continue with home medications include Sinemet -It seems that he is progressively declining with debility, poor mentation, No visible tremors at this time   Dementia-severe advanced -Continue with home medication include Depakote, Aricept and Namenda -We will continue to hold Seroquel given altered mental status -Reducing Depakote, anticipating holding Aricept and Namenda    Diabetes mellitus II -Patient is on Lantus at home, resumed at lower dose due to poor p.o. intake -Checking CBG QA CHS, SSI coverage -will hold semaglutide   Hypertension --hypotensive -Discontinued home medication of Norvasc and lisinopril Fluid resuscitation, blood pressure has stabilized    Hyperlipidemia -Discontinuing statins as the risk outweighs the benefit at his age and continued decline   GERD -Holding PPI in face of antibiotics to reduce the chance of adverse effect including C. difficile   CKD stage III  -Worsening kidney function in the face of poor p.o. intake -Reinitiating IV fluid -Encouraging p.o. intake -Monitoring BUN/creatinine closely, avoiding nephrotoxins, -BUN 27, creatinine 1.57 >>> 1.04   PVD -continue with cilostazol and statin -- D/ced  -Stable   Subdural hematoma -CT head showing stable less than 3 mm chronic bilateral subdural hematomas or dural thickening. No acute hemorrhage.  -Continue to monitor -Repeat CT head 02/24/2021 revealed bilateral unchanged subdural hematoma, no new changes         Skin Assessment: I have examined the patient's skin and I agree with the wound assessment as performed by wound care team As outlined belowe: Pressure Injury 02/21/21 Buttocks Left;Medial Stage 2 -  Partial thickness loss of dermis presenting as a shallow open injury with a red, pink wound bed without slough. nickel sized, pink wound base, no drainage noted (Active)  02/21/21 1845  Location: Buttocks  Location Orientation: Left;Medial  Staging: Stage 2 -  Partial thickness loss of dermis presenting as a shallow open injury with a red, pink wound bed without slough.  Wound Description (Comments): nickel sized, pink wound base, no drainage noted  Present on Admission: Yes     Pressure Injury 02/21/21 Buttocks Right;Medial Stage 2 -  Partial thickness loss of dermis presenting as a shallow open injury with a red, pink wound bed without slough. pencil  eraser sized, pink base, no drainage (Active)  02/21/21 1845  Location: Buttocks  Location Orientation: Right;Medial  Staging: Stage 2 -  Partial thickness loss of dermis presenting as a shallow open injury with a red, pink wound bed without slough.  Wound Description (Comments): pencil eraser sized, pink base, no drainage  Present on Admission: Yes    Ethics: Due to multiple comorbidities status decline, physically and mentally-prognosis remains poor, CODE STATUS remains DNR/DNI -currently we are recommending comfort care measures Possible hospice Palliative care was consulted, discussed with the patient wife also, she is agreeable to proceed with SNF, if continued decline hospice.   Severe debility -patient is now ambulating, status post evaluation by PT OT, recommending SNF   ----------------------------------------------------------------------------------------------------------------------------------------------- Cultures; Urine Culture  >>> NGT     Antimicrobials: IV Rocephin >>> switch to p.o. Cipro     Consultants: PT/OT....     ------------------------------------------------------------------------------------------------------------------------------------------------   DVT prophylaxis:  SCD/Compression stockings and Heparin SQ Code Status:   Code Status: DNR Code Status DNR confirmed by wife   Family Communication: Discussed with his wife over the phone She is agreeable to palliative care consult, if no improvement hospice She is agreeable to SNF placement     The above findings and plan of care has been discussed with patient (wife)  in detail, they expressed understanding and agreement of above. -Advance care planning has been discussed.     Dispo: The patient is from: Home              Anticipated d/c is to: SNF       Discharge Instructions:   Discharge Instructions     Activity as tolerated - No restrictions   Complete by: As directed    Diet  - low sodium heart healthy   Complete by: As directed    Discharge instructions   Complete by: As directed    Follow-up with palliative care at facility, continue current modified medication   Discharge wound care:   Complete by: As directed    Per nursing staff, wound care daily   Increase activity slowly   Complete by: As directed         Medication List     STOP taking these medications    amLODipine 2.5 MG tablet Commonly known as: NORVASC   atorvastatin 40 MG tablet Commonly known as: LIPITOR   Fish Oil 1000 MG Caps   fish oil-omega-3 fatty acids 1000 MG capsule   lisinopril 40 MG tablet Commonly known as: ZESTRIL   loperamide 2 MG tablet Commonly known as: IMODIUM A-D   meloxicam 7.5 MG tablet Commonly known as: MOBIC   pantoprazole 40 MG tablet Commonly known as: PROTONIX   QUEtiapine 25 MG tablet Commonly known as: SEROQUEL       TAKE these medications    aspirin EC 81 MG tablet Take 81 mg by mouth daily.   bismuth subsalicylate 262 MG chewable tablet Commonly known as: PEPTO BISMOL Chew 262 mg by mouth daily as needed for diarrhea or loose stools.   carbidopa-levodopa 25-100 MG tablet Commonly known as: SINEMET IR Take 1 tablet by mouth 3 (three) times daily.   ciprofloxacin 500 MG tablet Commonly known as: CIPRO Take 1 tablet (500 mg total) by mouth 2 (two) times daily for 5 days.   divalproex 250 MG DR tablet Commonly known as: DEPAKOTE Take 1 tablet (250 mg total) by mouth 2 (two) times daily. What changed: when to take this   donepezil 10 MG tablet Commonly known as: ARICEPT Take 10 mg by mouth at bedtime.   fluticasone 50 MCG/ACT nasal spray Commonly known as: FLONASE Place 2 sprays into both nostrils daily as needed for allergies.   insulin aspart 100 UNIT/ML injection Commonly known as: novoLOG Inject 24-36 Units into the skin See admin instructions. 24 units at lunch time, and 36 units at dinner *Do not take if levels are  under 150   insulin detemir 100 UNIT/ML injection Commonly known as: LEVEMIR Inject 36-55 Units into the skin 2 (two) times daily. 55 units each morning, and 36 units at bedtime   lactobacillus acidophilus & bulgar chewable tablet Chew 1 tablet by mouth 3 (three) times daily with meals for 5 days.   loratadine 10 MG tablet Commonly known as: CLARITIN Take 10 mg by mouth daily.   memantine 5 MG tablet Commonly known as: NAMENDA Take 5 mg by mouth 2 (two) times daily.   multivitamin with minerals Tabs tablet Take 1 tablet by mouth every morning.   niacin 500 MG tablet Commonly known as: SLO-NIACIN Take 500 mg by mouth at bedtime.   Semaglutide 3 MG Tabs Take 3 mg by mouth every morning.   VITAMIN B12 PO Take 1 tablet by mouth every morning.       ASK your  doctor about these medications    cilostazol 100 MG tablet Commonly known as: PLETAL TAKE 1 TABLET(100 MG) BY MOUTH TWICE DAILY BEFORE A MEAL        Contact information for after-discharge care     Destination     [facility] .   Service: Skilled Nursing Contact information: [address] [place] [address] [phone]                    No Known Allergies   Procedures /Studies:   CT HEAD WO CONTRAST  Result Date: 02/24/2021 CLINICAL DATA:  Follow-up subdural collection EXAM: CT HEAD WITHOUT CONTRAST TECHNIQUE: Contiguous axial images were obtained from the base of the skull through the vertex without intravenous contrast. COMPARISON:  Three days ago FINDINGS: Brain: Bilateral intermediate to low-density subdural collection/thickening measuring up to 3 mm in thickness and not causing any mass effect on the atrophic brain. No high-density/acute hemorrhage. No evidence of infarct, mass, or hydrocephalus. Generalized atrophy and chronic small vessel ischemia Vascular: No hyperdense vessel or unexpected calcification. Skull: Normal. Negative for  fracture or focal lesion. Sinuses/Orbits: No acute finding. Mucosal thickening in the left sphenoid sinus. IMPRESSION: Stable nonacute bilateral subdural hematoma without cerebral mass effect. No new abnormality. Electronically Signed   By: Marnee Spring M.D.   On: 02/24/2021 10:15   CT Head Wo Contrast  Result Date: 02/21/2021 CLINICAL DATA:  Dementia, Parkinson's disease, hypoglycemia, altered level of consciousness EXAM: CT HEAD WITHOUT CONTRAST TECHNIQUE: Contiguous axial images were obtained from the base of the skull through the vertex without intravenous contrast. COMPARISON:  06/05/2020 FINDINGS: Brain: No acute infarct or hemorrhage. Chronic small vessel ischemic changes are seen within the periventricular white matter. Lateral ventricles and midline structures are unremarkable. Stable bilateral less than 3 mm chronic subdural hematomas are again noted along the convexities. No acute extra-axial fluid collections. No mass effect. Vascular: No hyperdense vessel or unexpected calcification. Skull: Normal. Negative for fracture or focal lesion. Sinuses/Orbits: Polypoid mucosal thickening left sphenoid sinus. Remaining sinuses are clear. Chronic left mastoid effusion. Other: None. IMPRESSION: 1. Stable less than 3 mm chronic bilateral subdural hematomas or dural thickening. No acute hemorrhage. 2. Chronic small vessel ischemic changes.  No acute infarct. Electronically Signed   By: Sharlet Salina M.D.   On: 02/21/2021 15:56   DG Chest Portable 1 View  Result Date: 02/21/2021 CLINICAL DATA:  Altered. EXAM: PORTABLE CHEST 1 VIEW COMPARISON:  Radiograph 12/31/2020 FINDINGS: Low lung volumes. Stable heart size and mediastinal contours for technique. Aortic atherosclerosis. Streaky retrocardiac atelectasis. No confluent airspace disease. No pulmonary edema. No pleural effusion or pneumothorax. No acute osseous abnormalities are seen. IMPRESSION: Low lung volumes with retrocardiac atelectasis.  Electronically Signed   By: Narda Rutherford M.D.   On: 02/21/2021 17:15   EEG adult  Result Date: 02/25/2021 Charlsie Quest, MD     02/25/2021  3:00 PM Patient Name: Wilmar Prabhakar MRN: [id number] Epilepsy Attending: Charlsie Quest Referring Physician/Provider: Dr Nevin Bloodgood Date: 02/25/2021 Duration: 23.50 mins Patient history: 87 year old male with altered mental status.  EEG to evaluate for seizures. Level of alertness: Awake AEDs during EEG study: None Technical aspects: This EEG study was done with scalp electrodes positioned according to the 10-20 International system of electrode placement. Electrical activity was acquired at a sampling rate of  and reviewed with a high frequency filter of  and a low frequency filter of . EEG data were recorded continuously and  digitally stored. Description: The posterior dominant rhythm consists of 8 Hz activity of moderate voltage (25-35 uV) seen predominantly in posterior head regions, symmetric and reactive to eye opening and eye closing. Drowsiness was characterized by attenuation of the posterior background rhythm.  Patient was noted to have bilateral upper extremity tremors intermittently throughout the EEG.  Concomitant EEG before, during and after the event did not show any EEG changes suggest seizure.  Hyperventilation and photic stimulation were not performed.   IMPRESSION: This study is within normal limits. No seizures or epileptiform discharges were seen throughout the recording. Patient was noted to have marked upper extremity tremors intermittently throughout the EEG without concomitant EEG change.  These episodes were not epileptic. Priyanka Annabelle Harman    Subjective:   Patient was seen and examined 02/27/2021, 9:07 AM Patient stable today. No acute distress.  No issues overnight Stable for discharge.  Discharge Exam:    Vitals:   02/26/21 0517 02/26/21 1344 02/26/21 1948 02/27/21 0340  BP: (!) 124/59 (!) 121/98 (!) 127/57  117/63  Pulse: (!) 111 82 (!) 101 93  Resp: 16 18 19 19   Temp: 97.9 F (36.6 C) (!) 97.4 F (36.3 C) 97.7 F (36.5 C) 98.2 F (36.8 C)  TempSrc: Axillary Axillary    SpO2: 93% 100% 91% 100%  Weight:      Height:        General: Pt lying comfortably in bed & appears in no obvious distress. Cardiovascular: S1 & S2 heard, RRR, S1/S2 +. No murmurs, rubs, gallops or clicks. No JVD or pedal edema. Respiratory: Clear to auscultation without wheezing, rhonchi or crackles. No increased work of breathing. Abdominal:  Non-distended, non-tender & soft. No organomegaly or masses appreciated. Normal bowel sounds heard. CNS: Alert and oriented. No focal deficits. Extremities: no edema, no cyanosis Pressure Injury 02/21/21 Buttocks Left;Medial Stage 2 -  Partial thickness loss of dermis presenting as a shallow open injury with a red, pink wound bed without slough. nickel sized, pink wound base, no drainage noted (Active)  02/21/21 1845  Location: Buttocks  Location Orientation: Left;Medial  Staging: Stage 2 -  Partial thickness loss of dermis presenting as a shallow open injury with a red, pink wound bed without slough.  Wound Description (Comments): nickel sized, pink wound base, no drainage noted  Present on Admission: Yes     Pressure Injury 02/21/21 Buttocks Right;Medial Stage 2 -  Partial thickness loss of dermis presenting as a shallow open injury with a red, pink wound bed without slough. pencil eraser sized, pink base, no drainage (Active)  02/21/21 1845  Location: Buttocks  Location Orientation: Right;Medial  Staging: Stage 2 -  Partial thickness loss of dermis presenting as a shallow open injury with a red, pink wound bed without slough.  Wound Description (Comments): pencil eraser sized, pink base, no drainage  Present on Admission: Yes      The results of significant diagnostics from this hospitalization (including imaging, microbiology, ancillary and laboratory) are listed below  for reference.      Microbiology:   Recent Results (from the past 240 hour(s))  Urine Culture     Status: Abnormal   Collection Time: 02/21/21  1:55 PM   Specimen: In/Out Cath Urine  Result Value Ref Range Status   Specimen Description   Final    IN/OUT CATH URINE Performed at [facility], [address], [place], [place]    Special Requests   Final    NONE Performed at [facility], [address], [place], [place] [address]    Culture >=100,000 COLONIES/mL STAPHYLOCOCCUS EPIDERMIDIS (A)  Final   Report Status 02/24/2021 FINAL  Final   Organism ID, Bacteria STAPHYLOCOCCUS EPIDERMIDIS (A)  Final      Susceptibility   Staphylococcus epidermidis - MIC*    CIPROFLOXACIN <=0.5 SENSITIVE Sensitive     GENTAMICIN <=0.5 SENSITIVE Sensitive     NITROFURANTOIN <=16 SENSITIVE Sensitive     OXACILLIN <=0.25 SENSITIVE Sensitive     TETRACYCLINE <=1 SENSITIVE Sensitive     VANCOMYCIN <=0.5 SENSITIVE Sensitive     TRIMETH/SULFA <=10 SENSITIVE Sensitive     CLINDAMYCIN <=0.25 SENSITIVE Sensitive     RIFAMPIN <=0.5 SENSITIVE Sensitive     Inducible Clindamycin NEGATIVE Sensitive     * >=100,000 COLONIES/mL STAPHYLOCOCCUS EPIDERMIDIS  Resp Panel by RT-PCR (Flu A&B, Covid) Nasopharyngeal Swab     Status: None   Collection Time: 02/21/21  1:56 PM   Specimen: Nasopharyngeal Swab; Nasopharyngeal(NP) swabs in vial transport medium  Result Value Ref Range Status   SARS Coronavirus 2 by RT PCR NEGATIVE NEGATIVE Final    Comment: (NOTE) SARS-CoV-2 target nucleic acids are NOT DETECTED.  The SARS-CoV-2 RNA is generally detectable in upper respiratory specimens during the acute phase of infection. The lowest concentration of SARS-CoV-2 viral copies this assay can detect is 138 copies/mL. A negative result does not preclude SARS-Cov-2 infection and should not be used as the sole basis for treatment or other patient management decisions. A negative result may occur with  improper  specimen collection/handling, submission of specimen other than nasopharyngeal swab, presence of viral mutation(s) within the areas targeted by this assay, and inadequate number of viral copies(<138 copies/mL). A negative result must be combined with clinical observations, patient history, and epidemiological information. The expected result is Negative.  Fact Sheet for Patients:  BloggerCourse.com  Fact Sheet for Healthcare Providers:  SeriousBroker.it  This test is no t yet approved or cleared by the [place] FDA and  has been authorized for detection and/or diagnosis of SARS-CoV-2 by FDA under an Emergency Use Authorization (EUA). This EUA will remain  in effect (meaning this test can be used) for the duration of the COVID-19 declaration under Section 564(b)(1) of the Act, 21 U.S.C.section 360bbb-3(b)(1), unless the authorization is terminated  or revoked sooner.       Influenza A by PCR NEGATIVE NEGATIVE Final   Influenza B by PCR NEGATIVE NEGATIVE Final    Comment: (NOTE) The Xpert Xpress SARS-CoV-2/FLU/RSV plus assay is intended as an aid in the diagnosis of influenza from Nasopharyngeal swab specimens and should not be used as a sole basis for treatment. Nasal washings and aspirates are unacceptable for Xpert Xpress SARS-CoV-2/FLU/RSV testing.  Fact Sheet for Patients: BloggerCourse.com  Fact Sheet for Healthcare Providers: SeriousBroker.it  This test is not yet approved or cleared by the [place] FDA and has been authorized for detection and/or diagnosis of SARS-CoV-2 by FDA under an Emergency Use Authorization (EUA). This EUA will remain in effect (meaning this test can be used) for the duration of the COVID-19 declaration under Section 564(b)(1) of the Act, 21 U.S.C. section 360bbb-3(b)(1), unless the authorization is terminated or revoked.  Performed at  [facility], [address], [place], [place] [address]      Labs:   CBC: Recent Labs  Lab 02/21/21 1335 02/22/21 0457 02/24/21 0640 02/25/21 0436 02/26/21 0447 02/27/21 0434  WBC 5.2 6.0 6.1 6.0 5.9 6.3  NEUTROABS 2.6  --   --   --   --   --  HGB 10.7* 11.6* 12.2* 10.6* 10.9* 12.4*  HCT 33.2* 36.3* 38.8* 32.6* 34.9* 38.6*  MCV 89.2 88.8 88.2 87.9 90.4 87.9  PLT 114* 112* 123* 103* 99* 100*   Basic Metabolic Panel: Recent Labs  Lab 02/22/21 0457 02/24/21 0640 02/25/21 0436 02/26/21 0447 02/27/21 0434  NA 138 137 136 141 135  K 4.2 4.2 3.9 4.5 3.9  CL 107 104 107 109 105  CO2 23 24 22 24 22   GLUCOSE 112* 115* 156* 130* 117*  BUN 16 19 27* 17 13  CREATININE 1.20 1.38* 1.57* 1.04 0.90  CALCIUM 9.0 9.1 7.9* 9.3 9.0   Liver Function Tests: Recent Labs  Lab 02/21/21 1335  AST 15  ALT 8  ALKPHOS 68  BILITOT 0.6  PROT 7.0  ALBUMIN 3.6   BNP (last 3 results) No results for input(s): BNP in the last 8760 hours. Cardiac Enzymes: No results for input(s): CKTOTAL, CKMB, CKMBINDEX, TROPONINI in the last 168 hours. CBG: Recent Labs  Lab 02/26/21 0813 02/26/21 1138 02/26/21 1706 02/26/21 1946 02/27/21 0730  GLUCAP 105* 121* 120* 138* 118*   Hgb A1c No results for input(s): HGBA1C in the last 72 hours. Lipid Profile No results for input(s): CHOL, HDL, LDLCALC, TRIG, CHOLHDL, LDLDIRECT in the last 72 hours. Thyroid function studies No results for input(s): TSH, T4TOTAL, T3FREE, THYROIDAB in the last 72 hours.  Invalid input(s): FREET3 Anemia work up No results for input(s): VITAMINB12, FOLATE, FERRITIN, TIBC, IRON, RETICCTPCT in the last 72 hours. Urinalysis    Component Value Date/Time   COLORURINE YELLOW 02/25/2021 0822   APPEARANCEUR HAZY (A) 02/25/2021 0822   LABSPEC 1.009 02/25/2021 0822   PHURINE 6.0 02/25/2021 0822   GLUCOSEU NEGATIVE 02/25/2021 0822   HGBUR SMALL (A) 02/25/2021 0822   BILIRUBINUR NEGATIVE 02/25/2021 0822   KETONESUR NEGATIVE  02/25/2021 0822   PROTEINUR NEGATIVE 02/25/2021 0822   UROBILINOGEN 0.2 03/26/2015 1325   NITRITE NEGATIVE 02/25/2021 0822   LEUKOCYTESUR LARGE (A) 02/25/2021 0822   Pressure Injury 02/21/21 Buttocks Left;Medial Stage 2 -  Partial thickness loss of dermis presenting as a shallow open injury with a red, pink wound bed without slough. nickel sized, pink wound base, no drainage noted (Active)  02/21/21 1845  Location: Buttocks  Location Orientation: Left;Medial  Staging: Stage 2 -  Partial thickness loss of dermis presenting as a shallow open injury with a red, pink wound bed without slough.  Wound Description (Comments): nickel sized, pink wound base, no drainage noted  Present on Admission: Yes     Pressure Injury 02/21/21 Buttocks Right;Medial Stage 2 -  Partial thickness loss of dermis presenting as a shallow open injury with a red, pink wound bed without slough. pencil eraser sized, pink base, no drainage (Active)  02/21/21 1845  Location: Buttocks  Location Orientation: Right;Medial  Staging: Stage 2 -  Partial thickness loss of dermis presenting as a shallow open injury with a red, pink wound bed without slough.  Wound Description (Comments): pencil eraser sized, pink base, no drainage  Present on Admission: Yes       Time coordinating discharge: Over 55 minutes  SIGNED: 02/23/21, MD, FACP, [facility]. [facility],  Please use [email or web address] to Page If 7PM-7AM, please contact night-coverage [email or web address], Password [facility] 02/27/2021, 9:07 AM

## 2021-02-28 LAB — URINE CULTURE: Culture: NO GROWTH

## 2021-03-05 ENCOUNTER — Emergency Department (HOSPITAL_COMMUNITY): Payer: Medicare Other

## 2021-03-05 ENCOUNTER — Inpatient Hospital Stay (HOSPITAL_COMMUNITY)
Admission: EM | Admit: 2021-03-05 | Discharge: 2021-03-07 | DRG: 682 | Disposition: A | Discharge status: Other Acute Inpatient Hospital | Payer: Medicare Other | Source: Skilled Nursing Facility | Attending: Internal Medicine | Admitting: Internal Medicine

## 2021-03-05 DIAGNOSIS — E1165 Type 2 diabetes mellitus with hyperglycemia: Secondary | ICD-10-CM | POA: Diagnosis present

## 2021-03-05 DIAGNOSIS — E870 Hyperosmolality and hypernatremia: Secondary | ICD-10-CM | POA: Diagnosis present

## 2021-03-05 DIAGNOSIS — L89322 Pressure ulcer of left buttock, stage 2: Secondary | ICD-10-CM | POA: Diagnosis present

## 2021-03-05 DIAGNOSIS — S065X9A Traumatic subdural hemorrhage with loss of consciousness of unspecified duration, initial encounter: Secondary | ICD-10-CM | POA: Diagnosis not present

## 2021-03-05 DIAGNOSIS — D696 Thrombocytopenia, unspecified: Secondary | ICD-10-CM | POA: Diagnosis present

## 2021-03-05 DIAGNOSIS — Z66 Do not resuscitate: Secondary | ICD-10-CM | POA: Diagnosis present

## 2021-03-05 DIAGNOSIS — E7800 Pure hypercholesterolemia, unspecified: Secondary | ICD-10-CM | POA: Diagnosis present

## 2021-03-05 DIAGNOSIS — Z20822 Contact with and (suspected) exposure to covid-19: Secondary | ICD-10-CM | POA: Diagnosis present

## 2021-03-05 DIAGNOSIS — L89312 Pressure ulcer of right buttock, stage 2: Secondary | ICD-10-CM | POA: Diagnosis present

## 2021-03-05 DIAGNOSIS — I6203 Nontraumatic chronic subdural hemorrhage: Secondary | ICD-10-CM | POA: Diagnosis present

## 2021-03-05 DIAGNOSIS — D72829 Elevated white blood cell count, unspecified: Secondary | ICD-10-CM | POA: Diagnosis not present

## 2021-03-05 DIAGNOSIS — A419 Sepsis, unspecified organism: Secondary | ICD-10-CM

## 2021-03-05 DIAGNOSIS — R627 Adult failure to thrive: Secondary | ICD-10-CM | POA: Diagnosis present

## 2021-03-05 DIAGNOSIS — G9341 Metabolic encephalopathy: Secondary | ICD-10-CM | POA: Diagnosis present

## 2021-03-05 DIAGNOSIS — N179 Acute kidney failure, unspecified: Secondary | ICD-10-CM | POA: Diagnosis present

## 2021-03-05 DIAGNOSIS — G20 Parkinson's disease: Secondary | ICD-10-CM

## 2021-03-05 DIAGNOSIS — M1990 Unspecified osteoarthritis, unspecified site: Secondary | ICD-10-CM | POA: Diagnosis present

## 2021-03-05 DIAGNOSIS — G9340 Encephalopathy, unspecified: Secondary | ICD-10-CM

## 2021-03-05 DIAGNOSIS — I10 Essential (primary) hypertension: Secondary | ICD-10-CM | POA: Diagnosis present

## 2021-03-05 DIAGNOSIS — E872 Acidosis, unspecified: Secondary | ICD-10-CM

## 2021-03-05 DIAGNOSIS — R6510 Systemic inflammatory response syndrome (SIRS) of non-infectious origin without acute organ dysfunction: Secondary | ICD-10-CM | POA: Diagnosis present

## 2021-03-05 DIAGNOSIS — Z515 Encounter for palliative care: Secondary | ICD-10-CM | POA: Diagnosis not present

## 2021-03-05 DIAGNOSIS — F0281 Dementia in other diseases classified elsewhere with behavioral disturbance: Secondary | ICD-10-CM | POA: Diagnosis present

## 2021-03-05 DIAGNOSIS — K219 Gastro-esophageal reflux disease without esophagitis: Secondary | ICD-10-CM | POA: Diagnosis present

## 2021-03-05 DIAGNOSIS — E871 Hypo-osmolality and hyponatremia: Secondary | ICD-10-CM | POA: Diagnosis present

## 2021-03-05 DIAGNOSIS — Z87891 Personal history of nicotine dependence: Secondary | ICD-10-CM

## 2021-03-05 DIAGNOSIS — Z833 Family history of diabetes mellitus: Secondary | ICD-10-CM | POA: Diagnosis not present

## 2021-03-05 DIAGNOSIS — I739 Peripheral vascular disease, unspecified: Secondary | ICD-10-CM

## 2021-03-05 DIAGNOSIS — Z7189 Other specified counseling: Secondary | ICD-10-CM | POA: Diagnosis not present

## 2021-03-05 DIAGNOSIS — E860 Dehydration: Secondary | ICD-10-CM | POA: Diagnosis present

## 2021-03-05 DIAGNOSIS — E1151 Type 2 diabetes mellitus with diabetic peripheral angiopathy without gangrene: Secondary | ICD-10-CM | POA: Diagnosis present

## 2021-03-05 DIAGNOSIS — H9190 Unspecified hearing loss, unspecified ear: Secondary | ICD-10-CM | POA: Diagnosis present

## 2021-03-05 DIAGNOSIS — Z794 Long term (current) use of insulin: Secondary | ICD-10-CM

## 2021-03-05 DIAGNOSIS — Z79899 Other long term (current) drug therapy: Secondary | ICD-10-CM

## 2021-03-05 DIAGNOSIS — Z7982 Long term (current) use of aspirin: Secondary | ICD-10-CM

## 2021-03-05 DIAGNOSIS — S065XAA Traumatic subdural hemorrhage with loss of consciousness status unknown, initial encounter: Secondary | ICD-10-CM

## 2021-03-05 DIAGNOSIS — Z800 Family history of malignant neoplasm of digestive organs: Secondary | ICD-10-CM

## 2021-03-05 LAB — COMPREHENSIVE METABOLIC PANEL
ALT: 12 U/L (ref 0–44)
AST: 28 U/L (ref 15–41)
Albumin: 4.2 g/dL (ref 3.5–5.0)
Alkaline Phosphatase: 69 U/L (ref 38–126)
Anion gap: 12 (ref 5–15)
BUN: 94 mg/dL — ABNORMAL HIGH (ref 8–23)
CO2: 20 mmol/L — ABNORMAL LOW (ref 22–32)
Calcium: 9.3 mg/dL (ref 8.9–10.3)
Chloride: 116 mmol/L — ABNORMAL HIGH (ref 98–111)
Creatinine, Ser: 3.22 mg/dL — ABNORMAL HIGH (ref 0.61–1.24)
GFR, Estimated: 18 mL/min — ABNORMAL LOW (ref >60)
Glucose, Bld: 294 mg/dL — ABNORMAL HIGH (ref 70–99)
Potassium: 4.5 mmol/L (ref 3.5–5.1)
Sodium: 148 mmol/L — ABNORMAL HIGH (ref 135–145)
Total Bilirubin: 1.1 mg/dL (ref 0.3–1.2)
Total Protein: 7.7 g/dL (ref 6.5–8.1)

## 2021-03-05 LAB — RESP PANEL BY RT-PCR (FLU A&B, COVID) ARPGX2
Influenza A by PCR: NEGATIVE
Influenza B by PCR: NEGATIVE
SARS Coronavirus 2 by RT PCR: NEGATIVE

## 2021-03-05 LAB — URINALYSIS, COMPLETE (UACMP) WITH MICROSCOPIC
Bacteria, UA: NONE SEEN
Bilirubin Urine: NEGATIVE
Glucose, UA: 50 mg/dL — AB
Ketones, ur: 5 mg/dL — AB
Leukocytes,Ua: NEGATIVE
Nitrite: NEGATIVE
Protein, ur: NEGATIVE mg/dL
Specific Gravity, Urine: 1.015 (ref 1.005–1.030)
pH: 5 (ref 5.0–8.0)

## 2021-03-05 LAB — PROTIME-INR
INR: 1.1 (ref 0.8–1.2)
Prothrombin Time: 13.7 seconds (ref 11.4–15.2)

## 2021-03-05 LAB — CBC WITH DIFFERENTIAL/PLATELET
Abs Immature Granulocytes: 0.77 10*3/uL — ABNORMAL HIGH (ref 0.00–0.07)
Basophils Absolute: 0.1 10*3/uL (ref 0.0–0.1)
Basophils Relative: 0 %
Eosinophils Absolute: 0 10*3/uL (ref 0.0–0.5)
Eosinophils Relative: 0 %
HCT: 49 % (ref 39.0–52.0)
Hemoglobin: 15.4 g/dL (ref 13.0–17.0)
Immature Granulocytes: 3 %
Lymphocytes Relative: 11 %
Lymphs Abs: 2.6 10*3/uL (ref 0.7–4.0)
MCH: 28.8 pg (ref 26.0–34.0)
MCHC: 31.4 g/dL (ref 30.0–36.0)
MCV: 91.6 fL (ref 80.0–100.0)
Monocytes Absolute: 2.6 10*3/uL — ABNORMAL HIGH (ref 0.1–1.0)
Monocytes Relative: 11 %
Neutro Abs: 17 10*3/uL — ABNORMAL HIGH (ref 1.7–7.7)
Neutrophils Relative %: 75 %
Platelets: 142 10*3/uL — ABNORMAL LOW (ref 150–400)
RBC: 5.35 MIL/uL (ref 4.22–5.81)
RDW: 15.9 % — ABNORMAL HIGH (ref 11.5–15.5)
WBC: 23.1 10*3/uL — ABNORMAL HIGH (ref 4.0–10.5)
nRBC: 0 % (ref 0.0–0.2)

## 2021-03-05 LAB — GLUCOSE, CAPILLARY: Glucose-Capillary: 235 mg/dL — ABNORMAL HIGH (ref 70–99)

## 2021-03-05 LAB — LACTIC ACID, PLASMA
Lactic Acid, Venous: 1 mmol/L (ref 0.5–1.9)
Lactic Acid, Venous: 2.2 mmol/L (ref 0.5–1.9)

## 2021-03-05 MED ORDER — VANCOMYCIN HCL IN DEXTROSE 1-5 GM/200ML-% IV SOLN: 1000.0000 mg | INTRAVENOUS | Status: DC

## 2021-03-05 MED ORDER — METRONIDAZOLE 500 MG/100ML IV SOLN
500.0000 mg | Freq: Once | INTRAVENOUS | Status: AC
  Administered 2021-03-05: 500 mg via INTRAVENOUS
  Filled 2021-03-05: qty 100

## 2021-03-05 MED ORDER — LACTATED RINGERS IV SOLN: INTRAVENOUS | Status: DC

## 2021-03-05 MED ORDER — SODIUM CHLORIDE 0.9 % IV BOLUS
1000.0000 mL | Freq: Once | INTRAVENOUS | Status: AC
  Administered 2021-03-05: 1000 mL via INTRAVENOUS

## 2021-03-05 MED ORDER — SODIUM CHLORIDE 0.9 % IV BOLUS (SEPSIS)
1500.0000 mL | Freq: Once | INTRAVENOUS | Status: AC
  Administered 2021-03-05: 1500 mL via INTRAVENOUS

## 2021-03-05 MED ORDER — SODIUM CHLORIDE 0.9 % IV SOLN: 2.0000 g | INTRAVENOUS | Status: DC

## 2021-03-05 MED ORDER — INSULIN ASPART 100 UNIT/ML IJ SOLN
0.0000 [IU] | INTRAMUSCULAR | Status: DC
  Administered 2021-03-05: 3 [IU] via SUBCUTANEOUS
  Administered 2021-03-06: 2 [IU] via SUBCUTANEOUS
  Administered 2021-03-06: 1 [IU] via SUBCUTANEOUS
  Administered 2021-03-06: 3 [IU] via SUBCUTANEOUS

## 2021-03-05 MED ORDER — SODIUM CHLORIDE 0.9 % IV SOLN
2.0000 g | Freq: Once | INTRAVENOUS | Status: AC
  Administered 2021-03-05: 2 g via INTRAVENOUS
  Filled 2021-03-05: qty 2

## 2021-03-05 MED ORDER — VANCOMYCIN HCL 1500 MG/300ML IV SOLN
1500.0000 mg | Freq: Once | INTRAVENOUS | Status: AC
  Administered 2021-03-05: 1500 mg via INTRAVENOUS
  Filled 2021-03-05: qty 300

## 2021-03-05 MED ORDER — SODIUM CHLORIDE 0.9 % IV SOLN: INTRAVENOUS | Status: DC

## 2021-03-05 MED ORDER — VANCOMYCIN HCL IN DEXTROSE 1-5 GM/200ML-% IV SOLN: 1000.0000 mg | Freq: Once | INTRAVENOUS | Status: DC

## 2021-03-05 NOTE — ED Provider Notes (Signed)
[facility] EMERGENCY DEPARTMENT Provider Note   CSN: [id number] Arrival date & time: 03/05/21  1525     History Chief Complaint  Patient presents with   Altered Mental Status    [name] is a 87 y.o. male.  HPI Patient presents for local nursing facility with concern for altered mental status.  History is obtained by nursing staff from EMS.  Reportedly the patient was at his nursing facility, having arrived there about 5 days ago following discharge from this facility.  He was seen and evaluated here 2 weeks ago, admitted for altered mental status.  On discharge the patient was reportedly talkative.  Seemingly of the past day the patient has had altered mental status, with minimal responsiveness.  No reported fall, fever, no change in medication, diet. Level 5 caveat secondary to mental status changes.    Past Medical History:  Diagnosis Date   Arthritis    Dementia (HCC)    early stages   Diabetes mellitus    Eosinophilic colitis    GERD (gastroesophageal reflux disease)    HOH (hard of hearing)    Hypercholesterolemia    Hypertension     Patient Active Problem List   Diagnosis Date Noted   Pressure injury of skin 02/23/2021   Encephalopathy 02/23/2021   Failure to thrive in adult 02/21/2021   Dementia due to Parkinson's disease with behavioral disturbance (HCC) 02/21/2021   Parkinson disease (HCC) 02/21/2021   Dementia associated with other underlying disease with behavioral disturbance (HCC)    Acute lower UTI    Diarrhea 04/23/2018   Abdominal pain 04/23/2018   Atherosclerosis of artery of extremity with intermittent claudication (HCC) 03/03/2018   AC joint arthropathy    Eosinophilic colitis 67/89/3810   IBD (inflammatory bowel disease)    Mucosal abnormality of colon    Diverticulosis of colon with hemorrhage    Intractable diarrhea    Leukocytosis    Acute renal failure (HCC) 03/25/2015   Diabetes (HCC) 03/25/2015   Chronic diarrhea of unknown  origin 03/25/2015   Rotator cuff tear 01/04/2013   S/P rotator cuff repair right  08/20/17 01/04/2013   Arthritis, shoulder region 10/15/2012   Biceps tendonitis on left 10/15/2012   Adhesive bursitis of left shoulder 09/01/2012   Partial tear of rotator cuff(726.13) 09/01/2012   Pain in joint, shoulder region 07/09/2011   Muscle weakness (generalized) 07/09/2011   Bursitis of shoulder, left 07/03/2011    Past Surgical History:  Procedure Laterality Date   CATARACT EXTRACTION W/PHACO Left 03/07/2014   Procedure: CATARACT EXTRACTION PHACO AND INTRAOCULAR LENS PLACEMENT LEFT EYE CDE=7.11;  Surgeon: Loraine Leriche T. Nile Riggs, MD;  Location: [facility];  Service: Ophthalmology;  Laterality: Left;   CATARACT EXTRACTION W/PHACO Right 03/14/2014   Procedure: CATARACT EXTRACTION PHACO AND INTRAOCULAR LENS PLACEMENT RIGHT EYE CDE=10.62;  Surgeon: Loraine Leriche T. Nile Riggs, MD;  Location: [facility];  Service: Ophthalmology;  Laterality: Right;   COLONOSCOPY  2009   Dr. Darrick Penna: pancolonic diverticulosis, internal hemorrhoids   COLONOSCOPY N/A 03/29/2015   RMR: inflammatory changes in the proximal rectum, colon and  terminal ileum suspicious for inflammatory bowel disease. ie. primarily Crohns colitis. Status post biopsy as described. concomitant NSAID exposure could excaerbate pre-existing inflammatory bowel disease but I do not think todays finding are primarly related to recent ibuprofen use.    KNEE ARTHROSCOPY Left    RESECTION DISTAL CLAVICAL Left 10/15/2012   Procedure: RESECTION DISTAL CLAVICAL;  Surgeon: Vickki Hearing, MD;  Location: [facility];  Service: Orthopedics;  Laterality: Left;   SHOULDER ACROMIOPLASTY Left 10/15/2012   Procedure: SHOULDER ACROMIOPLASTY;  Surgeon: [name], MD;  Location: [facility];  Service: Orthopedics;  Laterality: Left;   SHOULDER ARTHROSCOPY WITH OPEN ROTATOR CUFF REPAIR Left 10/15/2012   Procedure: SHOULDER ARTHROSCOPY WITH OPEN ROTATOR CUFF REPAIR;  Surgeon: [name], MD;   Location: [facility];  Service: Orthopedics;  Laterality: Left;  open at 0912; with bicep tendon debridement;    SHOULDER OPEN ROTATOR CUFF REPAIR Right 08/20/2017   Procedure: ROTATOR CUFF REPAIR SHOULDER OPEN with removal of distal clavicle;  Surgeon: Vickki HearingHarrison, Stanley E, MD;  Location: [facility];  Service: Orthopedics;  Laterality: Right;   TONSILLECTOMY         Family History  Problem Relation Age of Onset   Arthritis Other    Cancer Other    Diabetes Other    Cancer Mother    Colon cancer Neg Hx     Social History   Tobacco Use   Smoking status: Former    Packs/day: 3.00    Years: 30.00    Pack years: 90.00    Types: Cigarettes    Quit date: 10/11/1993    Years since quitting: 27.4   Smokeless tobacco: Former   Tobacco comments:    Quit approx 20 years ago  Building services engineerVaping Use   Vaping Use: Never used  Substance Use Topics   Alcohol use: No    Alcohol/week: 0.0 standard drinks   Drug use: No    Home Medications Prior to Admission medications   Medication Sig Start Date End Date Taking? Authorizing Provider  aspirin EC 81 MG tablet Take 81 mg by mouth daily.    Yes [provider]  bismuth subsalicylate (PEPTO BISMOL) 262 MG chewable tablet Chew 262 mg by mouth daily as needed for diarrhea or loose stools.   Yes [provider]  carbidopa-levodopa (SINEMET IR) 25-100 MG tablet Take 1 tablet by mouth 3 (three) times daily. 12/06/20  Yes [provider]  cilostazol (PLETAL) 100 MG tablet TAKE 1 TABLET(100 MG) BY MOUTH TWICE DAILY BEFORE A MEAL Patient taking differently: Take 100 mg by mouth daily. 03/26/20  Yes [name], Brandon Christopher, MD  Cyanocobalamin (VITAMIN B12 PO) Take 1 tablet by mouth every morning.   Yes [provider]  divalproex (DEPAKOTE) 250 MG DR tablet Take 1 tablet (250 mg total) by mouth 2 (two) times daily. 02/26/21 03/28/21 Yes Shahmehdi, Seyed A, MD  donepezil (ARICEPT) 10 MG tablet Take 10 mg by mouth at bedtime.  07/16/17  Yes  [provider]  fluticasone (FLONASE) 50 MCG/ACT nasal spray Place 2 sprays into both nostrils daily as needed for allergies.   Yes [provider]  insulin aspart (NOVOLOG) 100 UNIT/ML injection Inject 24-36 Units into the skin See admin instructions. 24 units at lunch time, and 36 units at dinner *Do not take if levels are under 150   Yes [provider]  insulin detemir (LEVEMIR) 100 UNIT/ML injection Inject 36-55 Units into the skin 2 (two) times daily. 55 units each morning, and 36 units at bedtime   Yes [provider]  loratadine (CLARITIN) 10 MG tablet Take 10 mg by mouth daily.   Yes [provider]  memantine (NAMENDA) 5 MG tablet Take 5 mg by mouth 2 (two) times daily.   Yes [provider]  Multiple Vitamin (MULTIVITAMIN WITH MINERALS) TABS tablet Take 1 tablet by mouth every morning.   Yes [provider]  niacin (  SLO-NIACIN) 500 MG tablet Take 500 mg by mouth at bedtime.   Yes [provider]  Semaglutide 3 MG TABS Take 3 mg by mouth every morning.   Yes [provider]  vitamin B-12 (CYANOCOBALAMIN) 500 MCG tablet Take 1 tablet by mouth daily. 12/12/20  Yes [provider]    Allergies    Patient has no known allergies.  Review of Systems   Review of Systems  Unable to perform ROS: Acuity of condition   Physical Exam Updated Vital Signs BP 132/72   Pulse (!) 117   Temp 97.9 F (36.6 C) (Axillary)   Resp 19   Ht  (1.778 m)   Wt 83 kg   SpO2 99%   BMI 26.26 kg/m   Physical Exam Vitals and nursing note reviewed.  Constitutional:      Appearance: He is well-developed. He is ill-appearing.     Comments: Withdrawn elderly male minimally responsive to painful stimuli  HENT:     Head: Normocephalic and atraumatic.  Eyes:     Conjunctiva/sclera: Conjunctivae normal.  Cardiovascular:     Rate and Rhythm: Regular rhythm. Tachycardia present.  Pulmonary:     Effort: Pulmonary  effort is normal. No respiratory distress.     Breath sounds: No stridor.  Abdominal:     General: There is no distension.     Tenderness: There is no guarding.  Musculoskeletal:        General: No deformity.  Skin:    General: Skin is warm and dry.     Coloration: Skin is pale.  Neurological:     Comments: Minimally responsive to stimuli, moves extremities spontaneously, not to command, not speaking.  Psychiatric:     Comments: Impaired    ED Results / Procedures / Treatments   Labs (all labs ordered are listed, but only abnormal results are displayed) Labs Reviewed  COMPREHENSIVE METABOLIC PANEL - Abnormal; Notable for the following components:      Result Value   Sodium 148 (*)    Chloride 116 (*)    CO2 20 (*)    Glucose, Bld 294 (*)    BUN 94 (*)    Creatinine, Ser 3.22 (*)    GFR, Estimated 18 (*)    All other components within normal limits  CBC WITH DIFFERENTIAL/PLATELET - Abnormal; Notable for the following components:   WBC 23.1 (*)    RDW 15.9 (*)    Platelets 142 (*)    Neutro Abs 17.0 (*)    Monocytes Absolute 2.6 (*)    Abs Immature Granulocytes 0.77 (*)    All other components within normal limits  LACTIC ACID, PLASMA - Abnormal; Notable for the following components:   Lactic Acid, Venous 2.2 (*)    All other components within normal limits  CULTURE, BLOOD (ROUTINE X 2)  CULTURE, BLOOD (ROUTINE X 2)  RESP PANEL BY RT-PCR (FLU A&B, COVID) ARPGX2  PROTIME-INR  LACTIC ACID, PLASMA  URINALYSIS, COMPLETE (UACMP) WITH MICROSCOPIC  CREATININE, SERUM    EKG EKG Interpretation  Date/Time:  Tuesday March 05 2021 15:59:08 EDT Ventricular Rate:  127 PR Interval:  88 QRS Duration: 137 QT Interval:  327 QTC Calculation: 476 R Axis:   -54 Text Interpretation: Sinus tachycardia Right bundle branch block Artifact Abnormal ECG Confirmed by Gerhard Munch [phone]) on 03/05/2021 4:09:57 PM  Radiology CT HEAD WO CONTRAST  Result Date: 03/05/2021 CLINICAL DATA:   Altered level of consciousness today EXAM: CT HEAD WITHOUT CONTRAST TECHNIQUE:  Contiguous axial images were obtained from the base of the skull through the vertex without intravenous contrast. COMPARISON:  02/24/2021 FINDINGS: Brain: No acute infarct or hemorrhage. There are stable chronic bilateral subdural hematomas measuring up to 3 mm each, without mass effect. Chronic small vessel ischemic changes are seen within the periventricular white matter. The lateral ventricles and midline structures are unremarkable. Vascular: No hyperdense vessel or unexpected calcification. Skull: Normal. Negative for fracture or focal lesion. Sinuses/Orbits: Near complete opacification of the left sphenoid sinus. Remaining sinuses are clear. Other: None. IMPRESSION: 1. Stable chronic bilateral subdural hematomas measuring 3 mm, without mass effect. 2. No acute intracranial process. 3. Sphenoid sinus disease. Electronically Signed   By: Sharlet Salina M.D.   On: 03/05/2021 18:57   DG Chest Port 1 View  Result Date: 03/05/2021 CLINICAL DATA:  Altered level of consciousness EXAM: PORTABLE CHEST 1 VIEW COMPARISON:  02/21/2021 FINDINGS: Single frontal view of the chest demonstrates an unremarkable cardiac silhouette. No airspace disease, effusion, or pneumothorax. No acute bony abnormalities. Stable degenerative and postsurgical changes of the shoulders. IMPRESSION: 1. No acute intrathoracic process. Electronically Signed   By: Sharlet Salina M.D.   On: 03/05/2021 17:14    Procedures Procedures   Medications Ordered in ED Medications  sodium chloride 0.9 % bolus 1,000 mL (0 mLs Intravenous Stopped 03/05/21 1800)    And  0.9 %  sodium chloride infusion (has no administration in time range)  lactated ringers infusion (has no administration in time range)  vancomycin (VANCOREADY) IVPB 1500 mg/300 mL (1,500 mg Intravenous New Bag/Given 03/05/21 1805)  ceFEPIme (MAXIPIME) 2 g in sodium chloride 0.9 % 100 mL IVPB (has no  administration in time range)  vancomycin (VANCOCIN) IVPB 1000 mg/200 mL premix (has no administration in time range)  sodium chloride 0.9 % bolus 1,500 mL (1,500 mLs Intravenous New Bag/Given 03/05/21 1730)  ceFEPIme (MAXIPIME) 2 g in sodium chloride 0.9 % 100 mL IVPB (0 g Intravenous Stopped 03/05/21 1819)  metroNIDAZOLE (FLAGYL) IVPB 500 mg (0 mg Intravenous Stopped 03/05/21 1806)    ED Course  I have reviewed the triage vital signs and the nursing notes.  Pertinent labs & imaging results that were available during my care of the patient were reviewed by me and considered in my medical decision making (see chart for details).  With broad differential for altered mental status including sepsis versus intracranial hemorrhage versus encephalopathy versus stroke CT, x-ray, labs ordered.  Patient placed on continuous cardiac monitoring, pulse oximetry.  Cardiac 120 sinus tach abnormal Pulse ox 98% room air normal Chart review notable for recent admission with history of chronic subdural.   Patient with multiple SIRS criteria on arrival, tachycardia, tachypnea, and with borderline fever and leukocytosis on initial labs meets sepsis criteria, received broad-spectrum antibiotics.  7:38 PM Patient accompanied by his wife.  Heart rate has improved since arrival, though his interactivity has not changed appreciably.  She and I discussed all findings, results thus far Results concerning for sepsis with acute kidney injury, likely contributing to the patient's encephalopathy.  Head CT without substantial changes from chronic subdural hematoma demonstrated last month.  X-ray without evidence for absolute pneumonia. MDM Rules/Calculators/A&P MDM Number of Diagnoses or Management Options AKI (acute kidney injury) (HCC): new, needed workup Encephalopathy: new, needed workup Severe sepsis [facility]): new, needed workup   Amount and/or Complexity of Data Reviewed Clinical lab tests: reviewed and ordered Tests in  the radiology section of CPT: ordered and reviewed Tests in the medicine  section of CPT: ordered and reviewed Decide to obtain previous medical records or to obtain history from someone other than the patient: yes Obtain history from someone other than the patient: yes Review and summarize past medical records: yes Discuss the patient with other providers: yes Independent visualization of images, tracings, or specimens: yes  Risk of Complications, Morbidity, and/or Mortality Presenting problems: high Diagnostic procedures: high Management options: high  Critical Care Total time providing critical care: 30-74 minutes (45)  Patient Progress Patient progress: stable   Final Clinical Impression(s) / ED Diagnoses Final diagnoses:  Severe sepsis (HCC)  AKI (acute kidney injury) (HCC)  Encephalopathy     Gerhard Munch, MD 03/05/21 1940

## 2021-03-05 NOTE — ED Triage Notes (Signed)
Pt here by [facility] from [facility] for [facility]; [facility] en route 330

## 2021-03-05 NOTE — H&P (Signed)
History and Physical  [name] ION:[id number] DOB: [birth year] DOA: 03/05/2021  Referring physician: Carmin Muskrat, MD PCP: Denyce Robert, [place]  Patient coming from: [facility] SNF Chief Complaint: Altered mental status  HPI: [name] is an 87 y.o. male with medical history significant for IDDM, GERD, Parkinson disease, hypertension and dementia who presents to the emergency department from [facility] via EMS due to altered mental status.  Patient was unable to provide a history, history was obtained from ED physician and ED medical record.  Per report, patient was noted with 1 day of altered mental status with decreased responsiveness. He was admitted on 7/21 and discharged on 7/26 due to acute metabolic encephalopathy which was thought to be due to UTI and underlying Parkinson dementia.  He was then discharged to [facility], patient was reported to be talkative on discharge.  There was no report of fever, chest pain, fall, change in diet or medication.  ED Course:  In the emergency department, he was tachycardic and intermittently tachypneic, but other vital signs were within normal range.  Work-up in the ED showed leukocytosis, thrombocytopenia, hyponatremia, BUN/creatinine 94/3.22 (baseline creatinine at 0.9-1.2) and hyperglycemia.  Lactic acid 1.0 > 2.2.  influenza A, B, SARS coronavirus 2 was negative. CT of head without contrast showed stable chronic bilateral subdural hematomas measuring 3 mm, without mass-effect and no acute intracranial process Chest x-ray showed no acute intrathoracic process. Patient was provided with empiric IV antibiotics (vancomycin, Flagyl and cefepime) due to suspicion of infectious process, IV hydration was provided.  Hospitalist was asked to admit patient for further evaluation and management.  Review of Systems: This cannot be obtained at this time due to patient's current condition  Past Medical History:  Diagnosis Date   Arthritis    Dementia ([place])     early stages   Diabetes mellitus    Eosinophilic colitis    GERD (gastroesophageal reflux disease)    HOH (hard of hearing)    Hypercholesterolemia    Hypertension    Past Surgical History:  Procedure Laterality Date   CATARACT EXTRACTION W/PHACO Left 03/07/2014   Procedure: CATARACT EXTRACTION PHACO AND INTRAOCULAR LENS PLACEMENT LEFT EYE CDE=7.11;  Surgeon: Elta Guadeloupe T. Gershon Crane, MD;  Location: [facility];  Service: Ophthalmology;  Laterality: Left;   CATARACT EXTRACTION W/PHACO Right 03/14/2014   Procedure: CATARACT EXTRACTION PHACO AND INTRAOCULAR LENS PLACEMENT RIGHT EYE CDE=10.62;  Surgeon: Elta Guadeloupe T. Gershon Crane, MD;  Location: [facility];  Service: Ophthalmology;  Laterality: Right;   COLONOSCOPY  2009   Dr. Oneida Alar: pancolonic diverticulosis, internal hemorrhoids   COLONOSCOPY N/A 03/29/2015   RMR: inflammatory changes in the proximal rectum, colon and  terminal ileum suspicious for inflammatory bowel disease. ie. primarily Crohns colitis. Status post biopsy as described. concomitant NSAID exposure could excaerbate pre-existing inflammatory bowel disease but I do not think todays finding are primarly related to recent ibuprofen use.    KNEE ARTHROSCOPY Left    RESECTION DISTAL CLAVICAL Left 10/15/2012   Procedure: RESECTION DISTAL CLAVICAL;  Surgeon: Carole Civil, MD;  Location: [facility];  Service: Orthopedics;  Laterality: Left;   SHOULDER ACROMIOPLASTY Left 10/15/2012   Procedure: SHOULDER ACROMIOPLASTY;  Surgeon: Carole Civil, MD;  Location: [facility];  Service: Orthopedics;  Laterality: Left;   SHOULDER ARTHROSCOPY WITH OPEN ROTATOR CUFF REPAIR Left 10/15/2012   Procedure: SHOULDER ARTHROSCOPY WITH OPEN ROTATOR CUFF REPAIR;  Surgeon: Carole Civil, MD;  Location: [facility];  Service: Orthopedics;  Laterality: Left;  open at 0912; with bicep tendon debridement;  SHOULDER OPEN ROTATOR CUFF REPAIR Right 08/20/2017   Procedure: ROTATOR CUFF REPAIR SHOULDER OPEN with removal of distal clavicle;   Surgeon: Carole Civil, MD;  Location: [facility];  Service: Orthopedics;  Laterality: Right;   TONSILLECTOMY      Social History:  reports that he quit smoking about 27 years ago. His smoking use included cigarettes. He has a 90.00 pack-year smoking history. He has quit using smokeless tobacco. He reports that he does not drink alcohol and does not use drugs.   No Known Allergies  Family History  Problem Relation Age of Onset   Arthritis Other    Cancer Other    Diabetes Other    Cancer Mother    Colon cancer Neg Hx      Prior to Admission medications   Medication Sig Start Date End Date Taking? Authorizing Provider  aspirin EC 81 MG tablet Take 81 mg by mouth daily.    Yes [provider]  bismuth subsalicylate (PEPTO BISMOL) 262 MG chewable tablet Chew 262 mg by mouth daily as needed for diarrhea or loose stools.   Yes [provider]  carbidopa-levodopa (SINEMET IR) 25-100 MG tablet Take 1 tablet by mouth 3 (three) times daily. 12/06/20  Yes [provider]  cilostazol (PLETAL) 100 MG tablet TAKE 1 TABLET(100 MG) BY MOUTH TWICE DAILY BEFORE A MEAL Patient taking differently: Take 100 mg by mouth daily. 03/26/20  Yes Waynetta Sandy, MD  Cyanocobalamin (VITAMIN B12 PO) Take 1 tablet by mouth every morning.   Yes [provider]  divalproex (DEPAKOTE) 250 MG DR tablet Take 1 tablet (250 mg total) by mouth 2 (two) times daily. 02/26/21 03/28/21 Yes Shahmehdi, Seyed A, MD  donepezil (ARICEPT) 10 MG tablet Take 10 mg by mouth at bedtime.  07/16/17  Yes [provider]  fluticasone (FLONASE) 50 MCG/ACT nasal spray Place 2 sprays into both nostrils daily as needed for allergies.   Yes [provider]  insulin aspart (NOVOLOG) 100 UNIT/ML injection Inject 24-36 Units into the skin See admin instructions. 24 units at lunch time, and 36 units at dinner *Do not take if levels are under 150   Yes [provider]  insulin  detemir (LEVEMIR) 100 UNIT/ML injection Inject 36-55 Units into the skin 2 (two) times daily. 55 units each morning, and 36 units at bedtime   Yes [provider]  loratadine (CLARITIN) 10 MG tablet Take 10 mg by mouth daily.   Yes [provider]  memantine (NAMENDA) 5 MG tablet Take 5 mg by mouth 2 (two) times daily.   Yes [provider]  Multiple Vitamin (MULTIVITAMIN WITH MINERALS) TABS tablet Take 1 tablet by mouth every morning.   Yes [provider]  niacin (SLO-NIACIN) 500 MG tablet Take 500 mg by mouth at bedtime.   Yes [provider]  Semaglutide 3 MG TABS Take 3 mg by mouth every morning.   Yes [provider]  vitamin B-12 (CYANOCOBALAMIN) 500 MCG tablet Take 1 tablet by mouth daily. 12/12/20  Yes [provider]    Physical Exam: BP 123/82   Pulse (!) 118   Temp 98.8 F (37.1 C) (Axillary)   Resp (!) 23   Ht '5\' 10"'  (1.778 m)   Wt 83 kg   SpO2 97%   BMI 26.26 kg/m   General: 87 y.o. year-old male well developed ill-appearing but in no acute distress.  Somnolent but easily arousable HEENT: Dry mucous membrane.  NCAT, opens  only right eye to stimulation  Neck: Supple, trachea medial Cardiovascular: Tachycardia.  Regular rate and rhythm with no rubs or gallops.  No thyromegaly or JVD noted.  2/4 pulses in all 4 extremities. Respiratory: Clear to auscultation with no wheezes or rales. Good inspiratory effort. Abdomen: Soft, nontender nondistended with normal bowel sounds x4 quadrants. Muskuloskeletal: No cyanosis, clubbing or edema noted bilaterally Neuro: Patient does not follow command, patient was nonverbal.  Sensation appears intact. Skin: No ulcerative lesions noted or rashes Psychiatry: This cannot be assessed at this time due to patient's current condition         Labs on Admission:  Basic Metabolic Panel: Recent Labs  Lab 02/27/21 0434 03/05/21 1622  NA 135 148*  K 3.9 4.5  CL 105 116*  CO2 22  20*  GLUCOSE 117* 294*  BUN 13 94*  CREATININE 0.90 3.22*  CALCIUM 9.0 9.3   Liver Function Tests: Recent Labs  Lab 03/05/21 1622  AST 28  ALT 12  ALKPHOS 69  BILITOT 1.1  PROT 7.7  ALBUMIN 4.2   No results for input(s): LIPASE, AMYLASE in the last 168 hours. No results for input(s): AMMONIA in the last 168 hours. CBC: Recent Labs  Lab 02/27/21 0434 03/05/21 1622  WBC 6.3 23.1*  NEUTROABS  --  17.0*  HGB 12.4* 15.4  HCT 38.6* 49.0  MCV 87.9 91.6  PLT 100* 142*   Cardiac Enzymes: No results for input(s): CKTOTAL, CKMB, CKMBINDEX, TROPONINI in the last 168 hours.  BNP (last 3 results) No results for input(s): BNP in the last 8760 hours.  ProBNP (last 3 results) No results for input(s): PROBNP in the last 8760 hours.  CBG: Recent Labs  Lab 02/27/21 0730  GLUCAP 118*    Radiological Exams on Admission: CT HEAD WO CONTRAST  Result Date: 03/05/2021 CLINICAL DATA:  Altered level of consciousness today EXAM: CT HEAD WITHOUT CONTRAST TECHNIQUE: Contiguous axial images were obtained from the base of the skull through the vertex without intravenous contrast. COMPARISON:  02/24/2021 FINDINGS: Brain: No acute infarct or hemorrhage. There are stable chronic bilateral subdural hematomas measuring up to 3 mm each, without mass effect. Chronic small vessel ischemic changes are seen within the periventricular white matter. The lateral ventricles and midline structures are unremarkable. Vascular: No hyperdense vessel or unexpected calcification. Skull: Normal. Negative for fracture or focal lesion. Sinuses/Orbits: Near complete opacification of the left sphenoid sinus. Remaining sinuses are clear. Other: None. IMPRESSION: 1. Stable chronic bilateral subdural hematomas measuring 3 mm, without mass effect. 2. No acute intracranial process. 3. Sphenoid sinus disease. Electronically Signed   By: Randa Ngo M.D.   On: 03/05/2021 18:57   DG Chest Port 1 View  Result Date:  03/05/2021 CLINICAL DATA:  Altered level of consciousness EXAM: PORTABLE CHEST 1 VIEW COMPARISON:  02/21/2021 FINDINGS: Single frontal view of the chest demonstrates an unremarkable cardiac silhouette. No airspace disease, effusion, or pneumothorax. No acute bony abnormalities. Stable degenerative and postsurgical changes of the shoulders. IMPRESSION: 1. No acute intrathoracic process. Electronically Signed   By: Randa Ngo M.D.   On: 03/05/2021 17:14    EKG: I independently viewed the EKG done and my findings are as followed: Sinus tachycardia at a rate of 1 127 bpm with RBBB  Assessment/Plan Present on Admission:  Acute metabolic encephalopathy  Leukocytosis  Dementia due to Parkinson's disease with behavioral disturbance ([place])  Principal Problem:   Acute metabolic encephalopathy Active Problems:   AKI (acute kidney injury) ([place])  Leukocytosis   Dementia due to Parkinson's disease with behavioral disturbance (HCC)   Thrombocytopenia (HCC)   Hyperglycemia due to diabetes mellitus (HCC)   Hypernatremia   Lactic acidosis   PVD (peripheral vascular disease) (HCC)   Subdural hematoma (HCC)  Acute metabolic encephalopathy possibly due to multifactorial SIRS criteria in the setting of above Patient was reported to be occasionally talkative, however, he was reported to have been minimally responsive for 1 day Patient was tachycardic and intermittently tachypneic, WBC was elevated at 23.1 (met SIRS criteria).  No known source of infection at this time.  Urinalysis was unimpressive for UTI He was empirically started on IV vancomycin and cefepime, we shall continue same at this time with plan to de-escalate discontinue based on blood culture and procalcitonin CT of head without contrast showed stable chronic bilateral subdural hematomas measuring 3 mm, without mass-effect MRI of brain without contrast will be done in the morning Continue fall precaution and neurochecks Bedside swallow eval  prior to oral intake Consider neurology consult based on MRI finding  Leukocytosis possibly reactive vs infectious WBC 23.1, continue management as described above  Lactic acidosis possibly due to multifactorial including possible infectious process, dehydration, acute kidney injury Lactic acid 1.0 > 2.2; continue to trend lactic acid  Acute kidney injury/Dehydration BUN/creatinine 94/3.22 (baseline creatinine at 0.9-1.2 Continue IV hydration Renally adjust medications, avoid nephrotoxic agents/dehydration/hypotension  Thrombocytopenia possibly reactive Platelets 142; continue to monitor platelet levels  Hyperglycemia secondary to T2DM Continue ISS and hypoglycemia protocol  Hypernatremia Na 148, corrected sodium level due to hyperglycemia (294) = 151 IV hydration was provided in the ED Continue IV hydration and check serial BMP  Dementia due to Parkinson's disease Continue home meds when patient is able to tolerate oral intake  Peripheral Vascular disease Continue home meds when patient is able to tolerate oral intake  Subdural hematoma CT of head showed chronic stable bilateral subdural hematoma MRI of brain without contrast in the morning due to acute metabolic encephalopathy   DVT prophylaxis: SCDs  Code Status: DNR  Family Communication: None at bedside  Disposition Plan:  Patient is from:                        home Anticipated DC to:                   SNF or family members home Anticipated DC date:               2-3 days Anticipated DC barriers:         patient requires inpatient management due to acute metabolic encephalopathy and acute kidney injury requiring further management and further work-up   Consults called: None  Admission status: Inpatient    Bernadette Hoit MD [facility]  03/05/2021, 9:00 PM

## 2021-03-05 NOTE — ED Notes (Signed)
Pt adjusted in the bed, extra linens removed from under the patient, and pt turned on his side. Pt resting comfortably at this time and will continue to check on and monitor

## 2021-03-05 NOTE — Progress Notes (Signed)
Pharmacy Antibiotic Note  [name] is a 87 y.o. male admitted on 03/05/2021 with  unknown source of infection .  Pharmacy has been consulted for Vancomycin and Cefepime dosing.  Plan: Vancomycin 1500 mg IV x 1 dose. Vancomycin 1000 mg IV every 48 hours. Cefepime 2000 mg IV every 24 hours. Monitor labs, c/s, and vanco level as indicated.  Height: 5\' 10"  (177.8 cm) Weight: 83 kg (182 lb 15.7 oz) IBW/kg (Calculated) : 73  Temp (24hrs), Avg:99.3 F (37.4 C), Min:99.3 F (37.4 C), Max:99.3 F (37.4 C)  Recent Labs  Lab 02/27/21 0434 03/05/21 1622  WBC 6.3 23.1*  CREATININE 0.90 3.22*  LATICACIDVEN  --  1.0    Estimated Creatinine Clearance: 16.1 mL/min (A) (by C-G formula based on SCr of 3.22 mg/dL (H)).    No Known Allergies  Antimicrobials this admission: Vanco 8/2 >> Cefepime 8/2 >> Flagyl 8/2  Microbiology results: 8/2 BCx: pending   Thank you for allowing pharmacy to be a part of this patient's care.  10/2 03/05/2021 5:21 PM

## 2021-03-06 ENCOUNTER — Encounter (HOSPITAL_COMMUNITY): Payer: Self-pay | Admitting: Internal Medicine

## 2021-03-06 ENCOUNTER — Inpatient Hospital Stay (HOSPITAL_COMMUNITY): Payer: Medicare Other

## 2021-03-06 ENCOUNTER — Other Ambulatory Visit: Payer: Self-pay

## 2021-03-06 DIAGNOSIS — Z7189 Other specified counseling: Secondary | ICD-10-CM

## 2021-03-06 DIAGNOSIS — Z515 Encounter for palliative care: Secondary | ICD-10-CM

## 2021-03-06 LAB — CBC
HCT: 44.1 % (ref 39.0–52.0)
Hemoglobin: 12.9 g/dL — ABNORMAL LOW (ref 13.0–17.0)
MCH: 28.3 pg (ref 26.0–34.0)
MCHC: 29.3 g/dL — ABNORMAL LOW (ref 30.0–36.0)
MCV: 96.7 fL (ref 80.0–100.0)
Platelets: 84 10*3/uL — ABNORMAL LOW (ref 150–400)
RBC: 4.56 MIL/uL (ref 4.22–5.81)
RDW: 15.9 % — ABNORMAL HIGH (ref 11.5–15.5)
WBC: 17.6 10*3/uL — ABNORMAL HIGH (ref 4.0–10.5)
nRBC: 0 % (ref 0.0–0.2)

## 2021-03-06 LAB — LACTIC ACID, PLASMA
Lactic Acid, Venous: 3.7 mmol/L (ref 0.5–1.9)
Lactic Acid, Venous: 2.7 mmol/L (ref 0.5–1.9)

## 2021-03-06 LAB — PROTIME-INR
INR: 1.2 (ref 0.8–1.2)
Prothrombin Time: 15.4 seconds — ABNORMAL HIGH (ref 11.4–15.2)

## 2021-03-06 LAB — COMPREHENSIVE METABOLIC PANEL
ALT: 22 U/L (ref 0–44)
AST: 40 U/L (ref 15–41)
Albumin: 3.2 g/dL — ABNORMAL LOW (ref 3.5–5.0)
Alkaline Phosphatase: 58 U/L (ref 38–126)
Anion gap: 8 (ref 5–15)
BUN: 73 mg/dL — ABNORMAL HIGH (ref 8–23)
CO2: 21 mmol/L — ABNORMAL LOW (ref 22–32)
Calcium: 8.3 mg/dL — ABNORMAL LOW (ref 8.9–10.3)
Chloride: 125 mmol/L — ABNORMAL HIGH (ref 98–111)
Creatinine, Ser: 2.12 mg/dL — ABNORMAL HIGH (ref 0.61–1.24)
GFR, Estimated: 29 mL/min — ABNORMAL LOW (ref >60)
Glucose, Bld: 184 mg/dL — ABNORMAL HIGH (ref 70–99)
Potassium: 4.1 mmol/L (ref 3.5–5.1)
Sodium: 154 mmol/L — ABNORMAL HIGH (ref 135–145)
Total Bilirubin: 0.8 mg/dL (ref 0.3–1.2)
Total Protein: 6 g/dL — ABNORMAL LOW (ref 6.5–8.1)

## 2021-03-06 LAB — BASIC METABOLIC PANEL
Anion gap: 8 (ref 5–15)
BUN: 83 mg/dL — ABNORMAL HIGH (ref 8–23)
CO2: 21 mmol/L — ABNORMAL LOW (ref 22–32)
Calcium: 8.1 mg/dL — ABNORMAL LOW (ref 8.9–10.3)
Chloride: 123 mmol/L — ABNORMAL HIGH (ref 98–111)
Creatinine, Ser: 2.71 mg/dL — ABNORMAL HIGH (ref 0.61–1.24)
GFR, Estimated: 22 mL/min — ABNORMAL LOW (ref >60)
Glucose, Bld: 246 mg/dL — ABNORMAL HIGH (ref 70–99)
Potassium: 4.5 mmol/L (ref 3.5–5.1)
Sodium: 152 mmol/L — ABNORMAL HIGH (ref 135–145)

## 2021-03-06 LAB — PHOSPHORUS: Phosphorus: 5.2 mg/dL — ABNORMAL HIGH (ref 2.5–4.6)

## 2021-03-06 LAB — GLUCOSE, CAPILLARY
Glucose-Capillary: 213 mg/dL — ABNORMAL HIGH (ref 70–99)
Glucose-Capillary: 146 mg/dL — ABNORMAL HIGH (ref 70–99)
Glucose-Capillary: 161 mg/dL — ABNORMAL HIGH (ref 70–99)

## 2021-03-06 LAB — PROCALCITONIN: Procalcitonin: 0.19 ng/mL

## 2021-03-06 LAB — APTT: aPTT: 28 seconds (ref 24–36)

## 2021-03-06 LAB — MAGNESIUM: Magnesium: 2.3 mg/dL (ref 1.7–2.4)

## 2021-03-06 MED ORDER — LORAZEPAM 2 MG/ML PO CONC: 1.0000 mg | ORAL | Status: DC | PRN

## 2021-03-06 MED ORDER — ONDANSETRON 4 MG PO TBDP: 4.0000 mg | ORAL_TABLET | Freq: Four times a day (QID) | ORAL | Status: DC | PRN

## 2021-03-06 MED ORDER — CARBIDOPA-LEVODOPA 25-100 MG PO TABS: 1.0000 | ORAL_TABLET | Freq: Three times a day (TID) | ORAL | Status: DC

## 2021-03-06 MED ORDER — HALOPERIDOL LACTATE 2 MG/ML PO CONC: 0.5000 mg | ORAL | Status: DC | PRN

## 2021-03-06 MED ORDER — BIOTENE DRY MOUTH MT LIQD: 15.0000 mL | OROMUCOSAL | Status: DC | PRN

## 2021-03-06 MED ORDER — HALOPERIDOL 0.5 MG PO TABS: 0.5000 mg | ORAL_TABLET | ORAL | Status: DC | PRN

## 2021-03-06 MED ORDER — ONDANSETRON HCL 4 MG/2ML IJ SOLN: 4.0000 mg | Freq: Four times a day (QID) | INTRAMUSCULAR | Status: DC | PRN

## 2021-03-06 MED ORDER — MORPHINE SULFATE (CONCENTRATE) 10 MG/0.5ML PO SOLN: 2.6000 mg | ORAL | Status: DC | PRN

## 2021-03-06 MED ORDER — ACETAMINOPHEN 650 MG RE SUPP: 650.0000 mg | RECTAL | Status: DC | PRN

## 2021-03-06 MED ORDER — INSULIN DETEMIR 100 UNIT/ML ~~LOC~~ SOLN
5.0000 [IU] | Freq: Two times a day (BID) | SUBCUTANEOUS | Status: DC
  Administered 2021-03-06: 5 [IU] via SUBCUTANEOUS
  Filled 2021-03-06 (×3): qty 0.05

## 2021-03-06 MED ORDER — GLYCOPYRROLATE 0.2 MG/ML IJ SOLN: 0.2000 mg | INTRAMUSCULAR | Status: DC | PRN

## 2021-03-06 MED ORDER — ACETAMINOPHEN 650 MG RE SUPP: 650.0000 mg | Freq: Four times a day (QID) | RECTAL | Status: DC | PRN

## 2021-03-06 MED ORDER — GLYCOPYRROLATE 1 MG PO TABS: 1.0000 mg | ORAL_TABLET | ORAL | Status: DC | PRN

## 2021-03-06 MED ORDER — HALOPERIDOL LACTATE 5 MG/ML IJ SOLN: 0.5000 mg | INTRAMUSCULAR | Status: DC | PRN

## 2021-03-06 MED ORDER — POLYVINYL ALCOHOL 1.4 % OP SOLN: 1.0000 [drp] | Freq: Four times a day (QID) | OPHTHALMIC | Status: DC | PRN

## 2021-03-06 MED ORDER — DEXTROSE-NACL 5-0.45 % IV SOLN: INTRAVENOUS | Status: DC

## 2021-03-06 MED ORDER — ACETAMINOPHEN 325 MG PO TABS: 650.0000 mg | ORAL_TABLET | Freq: Four times a day (QID) | ORAL | Status: DC | PRN

## 2021-03-06 MED ORDER — BISACODYL 10 MG RE SUPP: 10.0000 mg | Freq: Every day | RECTAL | Status: DC | PRN

## 2021-03-06 MED ORDER — LORAZEPAM 1 MG PO TABS: 1.0000 mg | ORAL_TABLET | ORAL | Status: DC | PRN

## 2021-03-06 MED ORDER — LORAZEPAM 2 MG/ML IJ SOLN
1.0000 mg | INTRAMUSCULAR | Status: DC | PRN
  Administered 2021-03-07: 1 mg via INTRAVENOUS
  Filled 2021-03-06: qty 1

## 2021-03-06 NOTE — TOC Progression Note (Signed)
Transition of Care [facility]) - Progression Note    Patient Details  Name: [name] MRN: [id number] Date of Birth: [birth year]  Transition of Care [facility] CM/SW Contact  Karn Cassis, [place] Phone Number: 03/06/2021, 3:41 PM  Clinical Narrative:  Per palliative, family requesting residential hospice. Referral sent to [facility]. No beds at this time. TOC will follow up in AM and will discuss GIP if no beds.       Barriers to Discharge: Continued Medical Work up  Expected Discharge Plan and Services                                                 Social Determinants of Health (SDOH) Interventions    Readmission Risk Interventions Readmission Risk Prevention Plan 03/06/2021  Transportation Screening Complete  HRI or Home Care Consult Complete  Social Work Consult for Recovery Care Planning/Counseling Complete  Palliative Care Screening Not Applicable  Medication Review Oceanographer) Complete  Some recent data might be hidden

## 2021-03-06 NOTE — Plan of Care (Signed)
Pt admitted for AMS, hx of parkinson, dementia/alzhiemers- BP 108/76 (BP Location: Left Arm)   Pulse 99   Temp 98.7 F (37.1 C) (Oral)   Resp 20   Ht 5\' 10"  (1.778 m)   Wt 83 kg   SpO2 98%   BMI 26.26 kg/m  WNL , pt on tele, with NSR established. IV fluids started . No s/s of distress or pain. Bed in lowest position, call bell near by, bed alarm on. Pt has no skin issues.  03/06/21 12:03 AM

## 2021-03-06 NOTE — TOC Initial Note (Signed)
Transition of Care [facility]) - Initial/Assessment Note    Patient Details  Name: [name] MRN: [id number] Date of Birth: 1930/03/26  Transition of Care [facility]) CM/SW Contact:    Karn Cassis, LCSW Phone Number: 03/06/2021, 9:31 AM  Clinical Narrative:   Pt admitted due to acute metabolic encephalopathy. LCSW spoke with pt's wife to complete assessment as pt oriented to self only. Pt known to LCSW from admission last week. He d/c to [facility] for rehab. Pt's wife confirms plan to return to [facility] when medically stable. LCSW also spoke with Debbie at [facility] who is agreeable to return. Pt will need authorization prior to d/c. TOC will continue to follow.                   Barriers to Discharge: Continued Medical Work up   Patient Goals and CMS Choice        Expected Discharge Plan and Services                                                Prior Living Arrangements/Services                       Activities of Daily Living      Permission Sought/Granted                  Emotional Assessment              Admission diagnosis:  Encephalopathy [G93.40] AKI (acute kidney injury) (HCC) [N17.9] Severe sepsis (HCC) [A41.9, R65.20] Acute metabolic encephalopathy [G93.41] Patient Active Problem List   Diagnosis Date Noted   Acute metabolic encephalopathy 03/05/2021   Thrombocytopenia (HCC) 03/05/2021   Hyperglycemia due to diabetes mellitus (HCC) 03/05/2021   Hypernatremia 03/05/2021   Lactic acidosis 03/05/2021   PVD (peripheral vascular disease) (HCC) 03/05/2021   Subdural hematoma (HCC) 03/05/2021   Pressure injury of skin 02/23/2021   Encephalopathy 02/23/2021   Failure to thrive in adult 02/21/2021   Dementia due to Parkinson's disease with behavioral disturbance (HCC) 02/21/2021   Parkinson disease (HCC) 02/21/2021   Dementia associated with other underlying disease with behavioral disturbance (HCC)    Acute lower UTI     Diarrhea 04/23/2018   Abdominal pain 04/23/2018   Atherosclerosis of artery of extremity with intermittent claudication (HCC) 03/03/2018   AC joint arthropathy    Eosinophilic colitis 20/94/7096   IBD (inflammatory bowel disease)    Mucosal abnormality of colon    Diverticulosis of colon with hemorrhage    Intractable diarrhea    Leukocytosis    AKI (acute kidney injury) (HCC) 03/25/2015   Diabetes (HCC) 03/25/2015   Chronic diarrhea of unknown origin 03/25/2015   Rotator cuff tear 01/04/2013   S/P rotator cuff repair right  08/20/17 01/04/2013   Arthritis, shoulder region 10/15/2012   Biceps tendonitis on left 10/15/2012   Adhesive bursitis of left shoulder 09/01/2012   Partial tear of rotator cuff(726.13) 09/01/2012   Pain in joint, shoulder region 07/09/2011   Muscle weakness (generalized) 07/09/2011   Bursitis of shoulder, left 07/03/2011   PCP:  [name], FNP Pharmacy:   [facility] [phone] - [place], [place] - [address] AT [facility] OF [address] [place] [address] Phone: [phone] Fax: [phone]  [facility] [place] - [place], [place] -  [address] [place] [address] Phone: [phone] Fax: [phone]     Social Determinants of Health (SDOH) Interventions    Readmission Risk Interventions Readmission Risk Prevention Plan 03/06/2021  Transportation Screening Complete  HRI or Home Care Consult Complete  Social Work Consult for Recovery Care Planning/Counseling Complete  Palliative Care Screening Not Applicable  Medication Review Oceanographer) Complete  Some recent data might be hidden

## 2021-03-06 NOTE — NC FL2 (Signed)
[place] MEDICAID FL2 LEVEL OF CARE SCREENING TOOL     IDENTIFICATION  Patient Name: [name] Birthdate: [birth year] Sex: male Admission Date (Current Location): 03/05/2021  [facility] and [place] Number:  [employer] and Address:  [facility],  [address], [place] [address]      Provider Number: [id number]  Attending Physician Name and Address:  Osvaldo Shipper, MD  Relative Name and Phone Number:  Kyros, Salzwedel (Spouse)   [phone]    Current Level of Care: Hospital Recommended Level of Care: Skilled Nursing Facility Prior Approval Number:    Date Approved/Denied:   PASRR Number:    Discharge Plan: SNF    Current Diagnoses: Patient Active Problem List   Diagnosis Date Noted   Acute metabolic encephalopathy 03/05/2021   Thrombocytopenia (HCC) 03/05/2021   Hyperglycemia due to diabetes mellitus (HCC) 03/05/2021   Hypernatremia 03/05/2021   Lactic acidosis 03/05/2021   PVD (peripheral vascular disease) (HCC) 03/05/2021   Subdural hematoma (HCC) 03/05/2021   Pressure injury of skin 02/23/2021   Encephalopathy 02/23/2021   Failure to thrive in adult 02/21/2021   Dementia due to Parkinson's disease with behavioral disturbance (HCC) 02/21/2021   Parkinson disease (HCC) 02/21/2021   Dementia associated with other underlying disease with behavioral disturbance (HCC)    Acute lower UTI    Diarrhea 04/23/2018   Abdominal pain 04/23/2018   Atherosclerosis of artery of extremity with intermittent claudication (HCC) 03/03/2018   AC joint arthropathy    Eosinophilic colitis 89/21/1941   IBD (inflammatory bowel disease)    Mucosal abnormality of colon    Diverticulosis of colon with hemorrhage    Intractable diarrhea    Leukocytosis    AKI (acute kidney injury) (HCC) 03/25/2015   Diabetes (HCC) 03/25/2015   Chronic diarrhea of unknown origin 03/25/2015   Rotator cuff tear 01/04/2013   S/P rotator cuff repair right  08/20/17 01/04/2013    Arthritis, shoulder region 10/15/2012   Biceps tendonitis on left 10/15/2012   Adhesive bursitis of left shoulder 09/01/2012   Partial tear of rotator cuff(726.13) 09/01/2012   Pain in joint, shoulder region 07/09/2011   Muscle weakness (generalized) 07/09/2011   Bursitis of shoulder, left 07/03/2011    Orientation RESPIRATION BLADDER Height & Weight     Self  Normal External catheter Weight: 158 lb 15.2 oz (72.1 kg) Height:  5\' 10"  (177.8 cm)  BEHAVIORAL SYMPTOMS/MOOD NEUROLOGICAL BOWEL NUTRITION STATUS      Incontinent Diet (DIET DYS 2 Room service appropriate? Yes; Fluid consistency: Thin)  AMBULATORY STATUS COMMUNICATION OF NEEDS Skin   Extensive Assist Verbally Other (Comment) (Preassure injury Stage 2 buttock)                       Personal Care Assistance Level of Assistance  Bathing, Feeding, Dressing Bathing Assistance: Maximum assistance Feeding assistance: Limited assistance Dressing Assistance: Maximum assistance     Functional Limitations Info  Sight, Hearing, Speech Sight Info: Impaired Hearing Info: Impaired Speech Info: Adequate    SPECIAL CARE FACTORS FREQUENCY  PT (By licensed PT)                    Contractures Contractures Info: Not present    Additional Factors Info  Code Status, Allergies, Insulin Sliding Scale, Psychotropic Code Status Info: DNR Allergies Info: No known allergies Psychotropic Info: Depakote         Current Medications (03/06/2021):  This is the current hospital active medication list Current Facility-Administered  Medications  Medication Dose Route Frequency Provider Last Rate Last Admin   0.9 %  sodium chloride infusion   Intravenous Continuous Adefeso, Oladapo, DO 125 mL/hr at 03/05/21 2211 New Bag at 03/05/21 2211   ceFEPIme (MAXIPIME) 2 g in sodium chloride 0.9 % 100 mL IVPB  2 g Intravenous Q24H Adefeso, Oladapo, DO       insulin aspart (novoLOG) injection 0-9 Units  0-9 Units Subcutaneous Q4H Adefeso, Oladapo,  DO   1 Units at 03/06/21 9735   lactated ringers infusion   Intravenous Continuous Adefeso, Oladapo, DO 100 mL/hr at 03/06/21 0137 Rate Change at 03/06/21 0137   [START ON 03/25/2021] vancomycin (VANCOCIN) IVPB 1000 mg/200 mL premix  1,000 mg Intravenous Q48H Adefeso, Oladapo, DO         Discharge Medications: Please see discharge summary for a list of discharge medications.  Relevant Imaging Results:  Relevant Lab Results:   Additional Information PT SSN: [id number]  Karn Cassis, LCSW

## 2021-03-06 NOTE — Progress Notes (Signed)
[facility] PROGRESS NOTE   [name] RUE:[id number] DOB: 1930-03-14 DOA: 03/05/2021  PCP: [name], FNP  Brief History/Interval Summary: 87 y.o. male with medical history significant for IDDM, GERD, Parkinson disease, essential hypertension and dementia who presented to the emergency department from [facility] via EMS due to altered mental status.  Patient was recently hospitalized about a week ago with a similar issues.  At that time symptoms were thought to be due to UTI and underlying dementia.  He was discharged to skilled nursing facility.  In the emergency department patient was started on broad-spectrum antibiotics and was hospitalized again.   Consultants: Palliative care  Procedures: None yet  Antibiotics: Anti-infectives (From admission, onward)    Start     Dose/Rate Route Frequency Ordered Stop   2021/03/11 1800  vancomycin (VANCOCIN) IVPB 1000 mg/200 mL premix        1,000 mg 200 mL/hr over 60 Minutes Intravenous Every 48 hours 03/05/21 1721     03/06/21 1800  ceFEPIme (MAXIPIME) 2 g in sodium chloride 0.9 % 100 mL IVPB        2 g 200 mL/hr over 30 Minutes Intravenous Every 24 hours 03/05/21 1714     03/05/21 1730  vancomycin (VANCOREADY) IVPB 1500 mg/300 mL        1,500 mg 150 mL/hr over 120 Minutes Intravenous  Once 03/05/21 1658 03/05/21 1955   03/05/21 1700  ceFEPIme (MAXIPIME) 2 g in sodium chloride 0.9 % 100 mL IVPB        2 g 200 mL/hr over 30 Minutes Intravenous  Once 03/05/21 1650 03/05/21 1819   03/05/21 1700  metroNIDAZOLE (FLAGYL) IVPB 500 mg        500 mg 100 mL/hr over 60 Minutes Intravenous  Once 03/05/21 1650 03/05/21 1806   03/05/21 1700  vancomycin (VANCOCIN) IVPB 1000 mg/200 mL premix  Status:  Discontinued        1,000 mg 200 mL/hr over 60 Minutes Intravenous  Once 03/05/21 1650 03/05/21 1658       Subjective/Interval History: Patient obtunded.  Opens his eyes briefly when his name is called but does not really communicate  any.  Does not follow any commands.     Assessment/Plan:  Acute metabolic encephalopathy/SIRS Recently hospitalized for similar presentation.  At that time he was found to have UTI.  At the time of presentation he was noted to be tachycardic.  Had elevated WBC of 23.1.  However urine analysis was unimpressive for UTI.  Empirically started on vancomycin and cefepime.  Follow-up on cultures.  Procalcitonin only 0.19.  Lactic acid level was 3.7 improving to 2.7.  WBC improved to 17.6 this morning.  Influenza and COVID-19 test were negative. Continue antibacterials for today and we will consider de-escalating tomorrow based on culture data. Imaging studies did not show any focal findings.  No acute infarct noted on MRI brain.  Acute kidney injury/hypernatremia Patient had normal creatinine on July 27.  Presented with elevated BUN of 94 and creatinine of 3.22.  Most likely this is secondary to poor oral intake.  Patient being hydrated.  His sodium level is actually higher than yesterday.  We will change his IV fluids to gradually reduce this.  Monitor urine output.  Avoid nephrotoxic agents.  Lactic acidosis See above.  Seems to be trending down.  Thrombocytopenia Low counts could be due to acute illness.  No evidence of overt bleeding.  Hold heparin products.  Diabetes mellitus type 2, uncontrolled with hyperglycemia Monitor CBGs.  Continue SSI.  History of Parkinson's disease with dementia Appears to be primary reason for his frequent decompensation.  History of peripheral vascular disease Stable.  History of chronic subdural hematoma Stable.  Goals of care Patient was seen by palliative care during previous hospitalization.  He has returned to the hospital with similar presentation.  I believe that this is all likely due to his underlying issue or for Parkinson's disease and dementia.  We will request palliative care to reevaluate and continue goals of care conversation with family.    Pressure injury stage II Pressure Injury 02/21/21 Buttocks Left;Medial Stage 2 -  Partial thickness loss of dermis presenting as a shallow open injury with a red, pink wound bed without slough. nickel sized, pink wound base, no drainage noted (Active)  02/21/21 1845  Location: Buttocks  Location Orientation: Left;Medial  Staging: Stage 2 -  Partial thickness loss of dermis presenting as a shallow open injury with a red, pink wound bed without slough.  Wound Description (Comments): nickel sized, pink wound base, no drainage noted  Present on Admission: Yes     Pressure Injury 02/21/21 Buttocks Right;Medial Stage 2 -  Partial thickness loss of dermis presenting as a shallow open injury with a red, pink wound bed without slough. pencil eraser sized, pink base, no drainage (Active)  02/21/21 1845  Location: Buttocks  Location Orientation: Right;Medial  Staging: Stage 2 -  Partial thickness loss of dermis presenting as a shallow open injury with a red, pink wound bed without slough.  Wound Description (Comments): pencil eraser sized, pink base, no drainage  Present on Admission: Yes    DVT Prophylaxis: SCDs Code Status: DNR Family Communication: No family at bedside Disposition Plan: To be determined  Status is: Inpatient  Remains inpatient appropriate because:Altered mental status  Dispo: The patient is from: SNF              Anticipated d/c is to: SNF              Patient currently is not medically stable to d/c.   Difficult to place patient No       Medications: Scheduled:  insulin aspart  0-9 Units Subcutaneous Q4H   Continuous:  sodium chloride 125 mL/hr at 03/05/21 2211   ceFEPime (MAXIPIME) IV     lactated ringers 100 mL/hr at 03/06/21 0137   [START ON 03/12/2021] vancomycin     PRN:   Objective:  Vital Signs  Vitals:   03/06/21 0600 03/06/21 0700 03/06/21 0810 03/06/21 0820  BP: 125/70     Pulse: (!) 107  (!) 104 100  Resp:      Temp:      TempSrc:       SpO2:      Weight:  72.1 kg    Height:        Intake/Output Summary (Last 24 hours) at 03/06/2021 1156 Last data filed at 03/06/2021 0706 Gross per 24 hour  Intake 2093.74 ml  Output 450 ml  Net 1643.74 ml   Filed Weights   03/05/21 1532 03/06/21 0700  Weight: 83 kg 72.1 kg    General appearance: Somewhat obtunded.  Minimally responsive. Resp: Clear to auscultation bilaterally.  Normal effort Cardio: S1-S2 is normal regular.  No S3-S4.  No rubs murmurs or bruit GI: Abdomen is soft.  Nontender nondistended.  Bowel sounds are present normal.  No masses organomegaly Extremities: No edema.   Neurologic: Cogwheel rigidity noted.  Remains poorly responsive for the most part.  Lab Results:  Data Reviewed: I have personally reviewed following labs and imaging studies  CBC: Recent Labs  Lab 03/05/21 1622 03/06/21 0532  WBC 23.1* 17.6*  NEUTROABS 17.0*  --   HGB 15.4 12.9*  HCT 49.0 44.1  MCV 91.6 96.7  PLT 142* 84*    Basic Metabolic Panel: Recent Labs  Lab 03/05/21 1622 03/06/21 0019 03/06/21 0532 03/06/21 0612  NA 148* 152*  --  154*  K 4.5 4.5  --  4.1  CL 116* 123*  --  125*  CO2 20* 21*  --  21*  GLUCOSE 294* 246*  --  184*  BUN 94* 83*  --  73*  CREATININE 3.22* 2.71*  --  2.12*  CALCIUM 9.3 8.1*  --  8.3*  MG  --   --  2.3  --   PHOS  --   --  5.2*  --     GFR: Estimated Creatinine Clearance: 24.1 mL/min (A) (by C-G formula based on SCr of 2.12 mg/dL (H)).  Liver Function Tests: Recent Labs  Lab 03/05/21 1622 03/06/21 0612  AST 28 40  ALT 12 22  ALKPHOS 69 58  BILITOT 1.1 0.8  PROT 7.7 6.0*  ALBUMIN 4.2 3.2*     Coagulation Profile: Recent Labs  Lab 03/05/21 1622 03/06/21 0532  INR 1.1 1.2     CBG: Recent Labs  Lab 03/05/21 2210 03/06/21 0136 03/06/21 0724 03/06/21 1105  GLUCAP 235* 213* 146* 161*     Recent Results (from the past 240 hour(s))  Urine Culture     Status: None   Collection Time: 02/26/21 10:51 AM    Specimen: Urine, Clean Catch  Result Value Ref Range Status   Specimen Description   Final    URINE, CLEAN CATCH Performed at [facility], [address] [place], [place] [address]    Special Requests   Final    NONE Performed at [facility], [address], [place], [place] [address]    Culture   Final    NO GROWTH Performed at [facility], [address] [place], [place] [address]    Report Status 02/28/2021 FINAL  Final  Culture, blood (routine x 2)     Status: None (Preliminary result)   Collection Time: 03/05/21  4:22 PM   Specimen: BLOOD LEFT HAND  Result Value Ref Range Status   Specimen Description BLOOD LEFT HAND  Final   Special Requests   Final    BOTTLES DRAWN AEROBIC AND ANAEROBIC Blood Culture adequate volume   Culture   Final    NO GROWTH < 24 HOURS Performed at [facility], [address] [place], [place] [address]    Report Status PENDING  Incomplete  Culture, blood (routine x 2)     Status: None (Preliminary result)   Collection Time: 03/05/21  4:22 PM   Specimen: Right Antecubital; Blood  Result Value Ref Range Status   Specimen Description RIGHT ANTECUBITAL  Final   Special Requests   Final    BOTTLES DRAWN AEROBIC AND ANAEROBIC Blood Culture adequate volume   Culture   Final    NO GROWTH < 24 HOURS Performed at [facility], [address] [place], [place] [address]    Report Status PENDING  Incomplete  Resp Panel by RT-PCR (Flu A&B, Covid) Nasopharyngeal Swab     Status: None   Collection Time: 03/05/21  7:02 PM   Specimen: Nasopharyngeal Swab; Nasopharyngeal(NP) swabs in vial transport medium  Result  Value Ref Range Status   SARS Coronavirus 2 by RT PCR NEGATIVE NEGATIVE Final    Comment: (NOTE) SARS-CoV-2 target nucleic acids are NOT DETECTED.  The SARS-CoV-2 RNA is generally detectable in upper respiratory specimens during the acute phase of infection. The lowest concentration of SARS-CoV-2 viral copies this assay can detect is 138  copies/mL. A negative result does not preclude SARS-Cov-2 infection and should not be used as the sole basis for treatment or other patient management decisions. A negative result may occur with  improper specimen collection/handling, submission of specimen other than nasopharyngeal swab, presence of viral mutation(s) within the areas targeted by this assay, and inadequate number of viral copies(<138 copies/mL). A negative result must be combined with clinical observations, patient history, and epidemiological information. The expected result is Negative.  Fact Sheet for Patients:  BloggerCourse.com  Fact Sheet for Healthcare Providers:  SeriousBroker.it  This test is no t yet approved or cleared by the [place] FDA and  has been authorized for detection and/or diagnosis of SARS-CoV-2 by FDA under an Emergency Use Authorization (EUA). This EUA will remain  in effect (meaning this test can be used) for the duration of the COVID-19 declaration under Section 564(b)(1) of the Act, 21 U.S.C.section 360bbb-3(b)(1), unless the authorization is terminated  or revoked sooner.       Influenza A by PCR NEGATIVE NEGATIVE Final   Influenza B by PCR NEGATIVE NEGATIVE Final    Comment: (NOTE) The Xpert Xpress SARS-CoV-2/FLU/RSV plus assay is intended as an aid in the diagnosis of influenza from Nasopharyngeal swab specimens and should not be used as a sole basis for treatment. Nasal washings and aspirates are unacceptable for Xpert Xpress SARS-CoV-2/FLU/RSV testing.  Fact Sheet for Patients: BloggerCourse.com  Fact Sheet for Healthcare Providers: SeriousBroker.it  This test is not yet approved or cleared by the [place] FDA and has been authorized for detection and/or diagnosis of SARS-CoV-2 by FDA under an Emergency Use Authorization (EUA). This EUA will remain in effect (meaning  this test can be used) for the duration of the COVID-19 declaration under Section 564(b)(1) of the Act, 21 U.S.C. section 360bbb-3(b)(1), unless the authorization is terminated or revoked.  Performed at [facility], [address], [place], [place] [address]       Radiology Studies: CT HEAD WO CONTRAST  Result Date: 03/05/2021 CLINICAL DATA:  Altered level of consciousness today EXAM: CT HEAD WITHOUT CONTRAST TECHNIQUE: Contiguous axial images were obtained from the base of the skull through the vertex without intravenous contrast. COMPARISON:  02/24/2021 FINDINGS: Brain: No acute infarct or hemorrhage. There are stable chronic bilateral subdural hematomas measuring up to 3 mm each, without mass effect. Chronic small vessel ischemic changes are seen within the periventricular white matter. The lateral ventricles and midline structures are unremarkable. Vascular: No hyperdense vessel or unexpected calcification. Skull: Normal. Negative for fracture or focal lesion. Sinuses/Orbits: Near complete opacification of the left sphenoid sinus. Remaining sinuses are clear. Other: None. IMPRESSION: 1. Stable chronic bilateral subdural hematomas measuring 3 mm, without mass effect. 2. No acute intracranial process. 3. Sphenoid sinus disease. Electronically Signed   By: Sharlet Salina M.D.   On: 03/05/2021 18:57   MR BRAIN WO CONTRAST  Result Date: 03/06/2021 CLINICAL DATA:  Neuro deficit, acute, stroke suspected EXAM: MRI HEAD WITHOUT CONTRAST TECHNIQUE: Multiplanar, multiecho pulse sequences of the brain and surrounding structures were obtained without intravenous contrast. COMPARISON:  2019 FINDINGS: DWI, sagittal T1, GRE, and axial T2 sequences were attempted. Motion  artifact is present. Patient could not tolerate remainder of study. Brain: There is no acute infarction or intracranial hemorrhage. No intracranial mass or mass effect. Suspect trace bilateral cerebral convexity chronic subdural hematomas.  Prominence of the ventricles and sulci reflects generalized parenchymal volume loss patchy and confluent areas of T2 hyperintensity in the supratentorial white matter are nonspecific but may reflect chronic microvascular ischemic changes. Vascular: Major vessel flow voids at the skull base are preserved. Skull and upper cervical spine: Normal marrow signal is preserved. Sinuses/Orbits: Left sphenoid sinus inflammatory changes. Bilateral lens replacements. Other: Sella is unremarkable.  Chronic left mastoid effusion. IMPRESSION: No acute infarction. Chronic microvascular ischemic changes. Trace bilateral cerebral convexity chronic subdural hematomas. Electronically Signed   By: Guadlupe SpanishPraneil  Patel M.D.   On: 03/06/2021 08:50   DG Chest Port 1 View  Result Date: 03/05/2021 CLINICAL DATA:  Altered level of consciousness EXAM: PORTABLE CHEST 1 VIEW COMPARISON:  02/21/2021 FINDINGS: Single frontal view of the chest demonstrates an unremarkable cardiac silhouette. No airspace disease, effusion, or pneumothorax. No acute bony abnormalities. Stable degenerative and postsurgical changes of the shoulders. IMPRESSION: 1. No acute intrathoracic process. Electronically Signed   By: Sharlet SalinaMichael  Brown M.D.   On: 03/05/2021 17:14       LOS: 1 day   [name]  [facility] Pager on [email or web address]  03/06/2021, 11:56 AM

## 2021-03-06 NOTE — Consult Note (Signed)
Consultation Note Date: 03/06/2021   Patient Name: [name]  DOB: [birth year]  MRN: [id number]  Age / Sex: 87 y.o., male  PCP: Denyce Robert, [place] Referring Physician: Bonnielee Haff, MD  Reason for Consultation: Establishing goals of care and Hospice Evaluation  HPI/Patient Profile: 87 y.o. male  with past medical history of IDDM, Parkinson's disease, dementia, HTN, GERD, recent hospitalization 1 week ago then discharged to short-term rehab, readmitted with electrolyte imbalance admitted on 03/05/2021 with acute metabolic encephalopathy, acute kidney injury, hypernatremia, failure to thrive and dementia.   Clinical Assessment and Goals of Care: I have reviewed medical records including EPIC notes, labs and imaging, received report from RN, assessed the patient (this morning upon exam, Mr. Hickel does not interact with me in any meaningful way, and there is no family at bedside) and then later in the day met at the bedside along with wife of 32 years, May Largent and eldest daughter, Manus Gunning, to discuss diagnosis prognosis, GOC, EOL wishes, disposition and options.   I introduced Palliative Medicine as specialized medical care for people living with serious illness. It focuses on providing relief from the symptoms and stress of a serious illness. The goal is to improve quality of life for both the patient and the family.  We discussed their current illness, and both May and Manus Gunning describe a decline since the beginning of the year, a marked decline over the last 1 month to 2 weeks where Mr. Turnbough is no longer eating and drinking enough to sustain himself. The natural disease trajectory and expectations at EOL were discussed.  I attempted to elicit values and goals of care important to the patient.  Family shares that Mr. Maclaughlin would not want artificial nutrition and hydration to keep him alive  The difference  between aggressive medical intervention and comfort care was considered in light of the patient's goals of care.  We talked about residential hospice, comfort and dignity at end-of-life to let nature take its course.  Family asks appropriate questions about hospice care.  They are requesting comfort care, residential hospice.  Provider choice offered and family chooses [facility] of [place].  Family states that they would like a face-to-face meeting at [facility] to discuss details.  Discussed the importance of continued conversation with family and the medical providers regarding overall plan of care and treatment options, ensuring decisions are within the context of the patient's values and GOCs. Questions and concerns were addressed.  The family was encouraged to call with questions or concerns.  PMT will continue to support holistically.  Conference with attending, bedside nursing staff, transition of care team related to patient condition, needs, goals of care, request for comfort and dignity at end-of-life, residential hospice at [facility] to let nature take its course.  HCPOA   NEXT OF KIN -wife of 23 years, may Lupi.  Oldest daughter, Manus Gunning, is present at bedside to assist with support and decision-making.    SUMMARY OF RECOMMENDATIONS   Comfort care Stopping IV fluids Residential hospice  at [place] Planning: DNR  Symptom Management:  End-of-life order set implemented  Palliative Prophylaxis:  Frequent Pain Assessment, Oral Care, and Turn Reposition  Additional Recommendations (Limitations, Scope, Preferences): Full Comfort Care  Psycho-social/Spiritual:  Desire for further Chaplaincy support:yes Additional Recommendations: Caregiving  Support/Resources and Education on Hospice  Prognosis:  < 2 weeks, expected based on poor by mouth intake, decreased functional status, chronic illness burden.    Discharge  Planning:  Requesting comfort and dignity at end-of-life, residential hospice at [facility].       Primary Diagnoses: Present on Admission:  Acute metabolic encephalopathy  Leukocytosis  Dementia due to Parkinson's disease with behavioral disturbance ([place])   I have reviewed the medical record, interviewed the patient and family, and examined the patient. The following aspects are pertinent.  Past Medical History:  Diagnosis Date   Arthritis    Dementia ([place])    early stages   Diabetes mellitus    Eosinophilic colitis    GERD (gastroesophageal reflux disease)    HOH (hard of hearing)    Hypercholesterolemia    Hypertension    Social History   Socioeconomic History   Marital status: Married    Spouse name: Not on file   Number of children: Not on file   Years of education: Not on file   Highest education level: Not on file  Occupational History   Not on file  Tobacco Use   Smoking status: Former    Packs/day: 3.00    Years: 30.00    Pack years: 90.00    Types: Cigarettes    Quit date: 10/11/1993    Years since quitting: 27.4   Smokeless tobacco: Former   Tobacco comments:    Quit approx 20 years ago  Scientific laboratory technician Use: Never used  Substance and Sexual Activity   Alcohol use: No    Alcohol/week: 0.0 standard drinks   Drug use: No   Sexual activity: Yes    Birth control/protection: None  Other Topics Concern   Not on file  Social History Narrative   Not on file   Social Determinants of Health   Financial Resource Strain: Not on file  Food Insecurity: Not on file  Transportation Needs: Not on file  Physical Activity: Not on file  Stress: Not on file  Social Connections: Not on file   Family History  Problem Relation Age of Onset   Arthritis Other    Cancer Other    Diabetes Other    Cancer Mother    Colon cancer Neg Hx    Scheduled Meds:  carbidopa-levodopa  1 tablet Oral TID   insulin aspart  0-9 Units Subcutaneous Q4H    insulin detemir  5 Units Subcutaneous BID   Continuous Infusions:  ceFEPime (MAXIPIME) IV     dextrose 5 % and 0.45% NaCl 75 mL/hr at 03/06/21 1332   [START ON 03/13/2021] vancomycin     PRN Meds:.acetaminophen Medications Prior to Admission:  Prior to Admission medications   Medication Sig Start Date End Date Taking? Authorizing Provider  aspirin EC 81 MG tablet Take 81 mg by mouth daily.    Yes [provider]  bismuth subsalicylate (PEPTO BISMOL) 262 MG chewable tablet Chew 262 mg by mouth daily as needed for diarrhea or loose stools.   Yes [provider]  carbidopa-levodopa (SINEMET IR) 25-100 MG tablet Take 1 tablet by mouth 3 (three) times daily. 12/06/20  Yes [provider]  cilostazol (PLETAL) 100 MG tablet TAKE 1 TABLET(100 MG) BY MOUTH TWICE DAILY BEFORE A MEAL Patient taking differently: Take 100 mg by mouth daily. 03/26/20  Yes Waynetta [facility], MD  Cyanocobalamin (VITAMIN B12 PO) Take 1 tablet by mouth every morning.   Yes [provider]  divalproex (DEPAKOTE) 250 MG DR tablet Take 1 tablet (250 mg total) by mouth 2 (two) times daily. 02/26/21 03/28/21 Yes Shahmehdi, Seyed A, MD  donepezil (ARICEPT) 10 MG tablet Take 10 mg by mouth at bedtime.  07/16/17  Yes [provider]  fluticasone (FLONASE) 50 MCG/ACT nasal spray Place 2 sprays into both nostrils daily as needed for allergies.   Yes [provider]  insulin aspart (NOVOLOG) 100 UNIT/ML injection Inject 24-36 Units into the skin See admin instructions. 24 units at lunch time, and 36 units at dinner *Do not take if levels are under 150   Yes [provider]  insulin detemir (LEVEMIR) 100 UNIT/ML injection Inject 36-55 Units into the skin 2 (two) times daily. 55 units each morning, and 36 units at bedtime   Yes [provider]  loratadine (CLARITIN) 10 MG tablet Take 10 mg by mouth daily.   Yes [provider]  memantine (NAMENDA) 5 MG  tablet Take 5 mg by mouth 2 (two) times daily.   Yes [provider]  Multiple Vitamin (MULTIVITAMIN WITH MINERALS) TABS tablet Take 1 tablet by mouth every morning.   Yes [provider]  niacin (SLO-NIACIN) 500 MG tablet Take 500 mg by mouth at bedtime.   Yes [provider]  Semaglutide 3 MG TABS Take 3 mg by mouth every morning.   Yes [provider]  vitamin B-12 (CYANOCOBALAMIN) 500 MCG tablet Take 1 tablet by mouth daily. 12/12/20  Yes [provider]   No Known Allergies Review of Systems  Unable to perform ROS: Dementia   Physical Exam Vitals and nursing note reviewed.  Constitutional:      General: He is not in acute distress.    Appearance: He is not ill-appearing.  Cardiovascular:     Rate and Rhythm: Normal rate.  Pulmonary:     Effort: Pulmonary effort is normal. No respiratory distress.  Skin:    General: Skin is warm and dry.  Neurological:     Comments: Known dementia     Vital Signs: BP 120/62 (BP Location: Right Arm)   Pulse (!) 106   Temp 98.8 F (37.1 C) (Oral)   Resp 18   Ht _0  (1.778 m)   Wt 72.1 kg   SpO2 97%   BMI 22.81 kg/m  Pain Scale: PAINAD   Pain Score: 0-No pain   SpO2: SpO2: 97 % O2 Device:SpO2: 97 % O2 Flow Rate: .O2 Flow Rate (L/min): 0 L/min  IO: Intake/output summary:  Intake/Output Summary (Last 24 hours) at 03/06/2021 1452 Last data filed at 03/06/2021 1400 Gross per 24 hour  Intake 2093.74 ml  Output 1150 ml  Net 943.74 ml    LBM:   Baseline Weight: Weight: 83 kg Most recent weight: Weight: 72.1 kg     Palliative Assessment/Data:   Flowsheet Rows    Flowsheet Row Most Recent Value  Intake Tab   Referral Department Hospitalist  Unit at Time of Referral Med/Surg Unit  Palliative Care Primary Diagnosis Neurology  Date Notified 03/06/21  Palliative Care Type Return patient Palliative Care  Reason for referral Clarify Goals of Care  Date of Admission 03/05/21  Date first  seen by Palliative Care 03/06/21  # of days Palliative referral response time 0 Day(s)  # of days IP prior to Palliative referral 1  Clinical Assessment   Palliative Performance Scale Score 20%  Pain Max last 24 hours Not able to report  Pain Min Last 24 hours Not able to report  Dyspnea Max Last 24 Hours Not able to report  Dyspnea Min Last 24 hours Not able to report  Psychosocial & Spiritual Assessment   Palliative Care Outcomes        Time In: 1410 Time Out: 1520 Time Total: 70 minutes Greater than 50%  of this time was spent counseling and coordinating care related to the above assessment and plan.  Signed by: Drue Novel, NP   Please contact Palliative Medicine Team phone at [phone] for questions and concerns.  For individual provider: See Shea Evans

## 2021-03-07 ENCOUNTER — Inpatient Hospital Stay (HOSPITAL_COMMUNITY)
Admission: RE | Admit: 2021-03-07 | Discharge: 2021-04-04 | DRG: 951 | Disposition: E | Source: Skilled Nursing Facility | Attending: Internal Medicine | Admitting: Internal Medicine

## 2021-03-07 DIAGNOSIS — E870 Hyperosmolality and hypernatremia: Secondary | ICD-10-CM | POA: Diagnosis present

## 2021-03-07 DIAGNOSIS — M1990 Unspecified osteoarthritis, unspecified site: Secondary | ICD-10-CM | POA: Diagnosis present

## 2021-03-07 DIAGNOSIS — Z79899 Other long term (current) drug therapy: Secondary | ICD-10-CM | POA: Diagnosis not present

## 2021-03-07 DIAGNOSIS — Z809 Family history of malignant neoplasm, unspecified: Secondary | ICD-10-CM

## 2021-03-07 DIAGNOSIS — I6203 Nontraumatic chronic subdural hemorrhage: Secondary | ICD-10-CM | POA: Diagnosis present

## 2021-03-07 DIAGNOSIS — Z20822 Contact with and (suspected) exposure to covid-19: Secondary | ICD-10-CM | POA: Diagnosis present

## 2021-03-07 DIAGNOSIS — R000 Tachycardia, unspecified: Secondary | ICD-10-CM | POA: Diagnosis present

## 2021-03-07 DIAGNOSIS — G20A1 Parkinson's disease without dyskinesia, without mention of fluctuations: Secondary | ICD-10-CM | POA: Diagnosis present

## 2021-03-07 DIAGNOSIS — L89322 Pressure ulcer of left buttock, stage 2: Secondary | ICD-10-CM | POA: Diagnosis present

## 2021-03-07 DIAGNOSIS — Z961 Presence of intraocular lens: Secondary | ICD-10-CM | POA: Diagnosis present

## 2021-03-07 DIAGNOSIS — Z9841 Cataract extraction status, right eye: Secondary | ICD-10-CM | POA: Diagnosis not present

## 2021-03-07 DIAGNOSIS — R944 Abnormal results of kidney function studies: Secondary | ICD-10-CM | POA: Diagnosis present

## 2021-03-07 DIAGNOSIS — L89312 Pressure ulcer of right buttock, stage 2: Secondary | ICD-10-CM | POA: Diagnosis present

## 2021-03-07 DIAGNOSIS — K219 Gastro-esophageal reflux disease without esophagitis: Secondary | ICD-10-CM | POA: Diagnosis present

## 2021-03-07 DIAGNOSIS — G9341 Metabolic encephalopathy: Secondary | ICD-10-CM | POA: Diagnosis present

## 2021-03-07 DIAGNOSIS — Z515 Encounter for palliative care: Secondary | ICD-10-CM

## 2021-03-07 DIAGNOSIS — G20 Parkinson's disease: Secondary | ICD-10-CM | POA: Diagnosis present

## 2021-03-07 DIAGNOSIS — D696 Thrombocytopenia, unspecified: Secondary | ICD-10-CM | POA: Diagnosis present

## 2021-03-07 DIAGNOSIS — N390 Urinary tract infection, site not specified: Secondary | ICD-10-CM | POA: Diagnosis present

## 2021-03-07 DIAGNOSIS — E872 Acidosis: Secondary | ICD-10-CM | POA: Diagnosis present

## 2021-03-07 DIAGNOSIS — Z794 Long term (current) use of insulin: Secondary | ICD-10-CM | POA: Diagnosis not present

## 2021-03-07 DIAGNOSIS — R6510 Systemic inflammatory response syndrome (SIRS) of non-infectious origin without acute organ dysfunction: Secondary | ICD-10-CM | POA: Diagnosis present

## 2021-03-07 DIAGNOSIS — F0281 Dementia in other diseases classified elsewhere with behavioral disturbance: Secondary | ICD-10-CM | POA: Diagnosis present

## 2021-03-07 DIAGNOSIS — Z833 Family history of diabetes mellitus: Secondary | ICD-10-CM

## 2021-03-07 DIAGNOSIS — Z9842 Cataract extraction status, left eye: Secondary | ICD-10-CM | POA: Diagnosis not present

## 2021-03-07 DIAGNOSIS — H9190 Unspecified hearing loss, unspecified ear: Secondary | ICD-10-CM | POA: Diagnosis present

## 2021-03-07 DIAGNOSIS — F0390 Unspecified dementia without behavioral disturbance: Secondary | ICD-10-CM | POA: Diagnosis present

## 2021-03-07 DIAGNOSIS — E1165 Type 2 diabetes mellitus with hyperglycemia: Secondary | ICD-10-CM | POA: Diagnosis present

## 2021-03-07 DIAGNOSIS — Z66 Do not resuscitate: Secondary | ICD-10-CM | POA: Diagnosis present

## 2021-03-07 DIAGNOSIS — I10 Essential (primary) hypertension: Secondary | ICD-10-CM | POA: Diagnosis present

## 2021-03-07 DIAGNOSIS — N179 Acute kidney failure, unspecified: Secondary | ICD-10-CM | POA: Diagnosis present

## 2021-03-07 DIAGNOSIS — E1151 Type 2 diabetes mellitus with diabetic peripheral angiopathy without gangrene: Secondary | ICD-10-CM | POA: Diagnosis present

## 2021-03-07 DIAGNOSIS — E7800 Pure hypercholesterolemia, unspecified: Secondary | ICD-10-CM | POA: Diagnosis present

## 2021-03-07 MED ORDER — BIOTENE DRY MOUTH MT LIQD: 15.0000 mL | OROMUCOSAL | Status: DC | PRN

## 2021-03-07 MED ORDER — HALOPERIDOL 0.5 MG PO TABS: 0.5000 mg | ORAL_TABLET | ORAL | Status: DC | PRN

## 2021-03-07 MED ORDER — HALOPERIDOL LACTATE 2 MG/ML PO CONC
0.5000 mg | ORAL | Status: DC | PRN
  Filled 2021-03-07: qty 0.3

## 2021-03-07 MED ORDER — ONDANSETRON 4 MG PO TBDP: 4.0000 mg | ORAL_TABLET | Freq: Four times a day (QID) | ORAL | 0 refills | Status: AC | PRN

## 2021-03-07 MED ORDER — ONDANSETRON HCL 4 MG/2ML IJ SOLN: 4.0000 mg | Freq: Four times a day (QID) | INTRAMUSCULAR | 0 refills | Status: AC | PRN

## 2021-03-07 MED ORDER — LORAZEPAM 2 MG/ML PO CONC: 1.0000 mg | ORAL | Status: DC | PRN

## 2021-03-07 MED ORDER — GLYCOPYRROLATE 0.2 MG/ML IJ SOLN: 0.2000 mg | INTRAMUSCULAR | Status: DC | PRN

## 2021-03-07 MED ORDER — POLYVINYL ALCOHOL 1.4 % OP SOLN: 1.0000 [drp] | Freq: Four times a day (QID) | OPHTHALMIC | 0 refills | Status: AC | PRN

## 2021-03-07 MED ORDER — GLYCOPYRROLATE 0.2 MG/ML IJ SOLN: 0.2000 mg | INTRAMUSCULAR | Status: AC | PRN

## 2021-03-07 MED ORDER — LORAZEPAM 2 MG/ML IJ SOLN: 1.0000 mg | INTRAMUSCULAR | 0 refills | Status: AC | PRN

## 2021-03-07 MED ORDER — HALOPERIDOL LACTATE 5 MG/ML IJ SOLN: 0.5000 mg | INTRAMUSCULAR | Status: AC | PRN

## 2021-03-07 MED ORDER — MORPHINE SULFATE (CONCENTRATE) 10 MG/0.5ML PO SOLN: 5.0000 mg | ORAL | Status: DC | PRN

## 2021-03-07 MED ORDER — ONDANSETRON HCL 4 MG/2ML IJ SOLN: 4.0000 mg | Freq: Four times a day (QID) | INTRAMUSCULAR | Status: DC | PRN

## 2021-03-07 MED ORDER — LORAZEPAM 1 MG PO TABS: 1.0000 mg | ORAL_TABLET | ORAL | Status: DC | PRN

## 2021-03-07 MED ORDER — ACETAMINOPHEN 325 MG PO TABS: 650.0000 mg | ORAL_TABLET | Freq: Four times a day (QID) | ORAL | Status: DC | PRN

## 2021-03-07 MED ORDER — DIPHENHYDRAMINE HCL 50 MG/ML IJ SOLN: 12.5000 mg | INTRAMUSCULAR | Status: DC | PRN

## 2021-03-07 MED ORDER — HALOPERIDOL LACTATE 5 MG/ML IJ SOLN: 0.5000 mg | INTRAMUSCULAR | Status: DC | PRN

## 2021-03-07 MED ORDER — POLYVINYL ALCOHOL 1.4 % OP SOLN: 1.0000 [drp] | Freq: Four times a day (QID) | OPHTHALMIC | Status: DC | PRN

## 2021-03-07 MED ORDER — MORPHINE SULFATE (PF) 2 MG/ML IV SOLN
1.0000 mg | INTRAVENOUS | Status: DC | PRN
  Administered 2021-03-08: 1 mg via INTRAVENOUS
  Filled 2021-03-07: qty 1

## 2021-03-07 MED ORDER — BISACODYL 10 MG RE SUPP: 10.0000 mg | Freq: Every day | RECTAL | 0 refills | Status: AC | PRN

## 2021-03-07 MED ORDER — LORAZEPAM 2 MG/ML IJ SOLN
1.0000 mg | INTRAMUSCULAR | Status: DC | PRN
  Administered 2021-03-08: 1 mg via INTRAVENOUS
  Filled 2021-03-07: qty 1

## 2021-03-07 MED ORDER — HALOPERIDOL 0.5 MG PO TABS: 0.5000 mg | ORAL_TABLET | ORAL | Status: AC | PRN

## 2021-03-07 MED ORDER — MORPHINE SULFATE (CONCENTRATE) 10 MG/0.5ML PO SOLN: 2.6000 mg | ORAL | 0 refills | Status: AC | PRN

## 2021-03-07 MED ORDER — GLYCOPYRROLATE 1 MG PO TABS: 1.0000 mg | ORAL_TABLET | ORAL | Status: AC | PRN

## 2021-03-07 MED ORDER — GLYCOPYRROLATE 1 MG PO TABS: 1.0000 mg | ORAL_TABLET | ORAL | Status: DC | PRN

## 2021-03-07 MED ORDER — ACETAMINOPHEN 650 MG RE SUPP: 650.0000 mg | Freq: Four times a day (QID) | RECTAL | Status: DC | PRN

## 2021-03-07 MED ORDER — ONDANSETRON 4 MG PO TBDP: 4.0000 mg | ORAL_TABLET | Freq: Four times a day (QID) | ORAL | Status: DC | PRN

## 2021-03-07 MED ORDER — LORAZEPAM 1 MG PO TABS: 1.0000 mg | ORAL_TABLET | ORAL | 0 refills | Status: AC | PRN

## 2021-03-07 NOTE — Progress Notes (Signed)
[facility] PROGRESS NOTE   [name] ZOX:[id number] DOB: 27-Dec-1929 DOA: 03/05/2021  PCP: Marylynn PearsonMcCorkle, Tenika, FNP  Brief History/Interval Summary: 87 y.o. male with medical history significant for IDDM, GERD, Parkinson disease, essential hypertension and dementia who presented to the emergency department [facility] due to altered mental status.  Patient was recently hospitalized about a week ago with a similar issues.  At that time symptoms were thought to be due to UTI and underlying dementia.  He was discharged to skilled nursing facility.  In the emergency department patient was started on broad-spectrum antibiotics and was hospitalized again.   Consultants: Palliative care  Procedures: None yet  Antibiotics: Anti-infectives (From admission, onward)    Start     Dose/Rate Route Frequency Ordered Stop   04-26-2021 1800  vancomycin (VANCOCIN) IVPB 1000 mg/200 mL premix  Status:  Discontinued        1,000 mg 200 mL/hr over 60 Minutes Intravenous Every 48 hours 03/05/21 1721 03/06/21 1531   03/06/21 1800  ceFEPIme (MAXIPIME) 2 g in sodium chloride 0.9 % 100 mL IVPB  Status:  Discontinued        2 g 200 mL/hr over 30 Minutes Intravenous Every 24 hours 03/05/21 1714 03/06/21 1531   03/05/21 1730  vancomycin (VANCOREADY) IVPB 1500 mg/300 mL        1,500 mg 150 mL/hr over 120 Minutes Intravenous  Once 03/05/21 1658 03/05/21 1955   03/05/21 1700  ceFEPIme (MAXIPIME) 2 g in sodium chloride 0.9 % 100 mL IVPB        2 g 200 mL/hr over 30 Minutes Intravenous  Once 03/05/21 1650 03/05/21 1819   03/05/21 1700  metroNIDAZOLE (FLAGYL) IVPB 500 mg        500 mg 100 mL/hr over 60 Minutes Intravenous  Once 03/05/21 1650 03/05/21 1806   03/05/21 1700  vancomycin (VANCOCIN) IVPB 1000 mg/200 mL premix  Status:  Discontinued        1,000 mg 200 mL/hr over 60 Minutes Intravenous  Once 03/05/21 1650 03/05/21 1658       Subjective/Interval History: Patient minimally  responsive.     Assessment/Plan:  Acute metabolic encephalopathy/SIRS Recently hospitalized for similar presentation.  At that time he was found to have UTI.  At the time of presentation he was noted to be tachycardic.  Had elevated WBC of 23.1.  However urine analysis was unimpressive for UTI.  Empirically started on vancomycin and cefepime.  Influenza and COVID-19 test were negative. Palliative care was consulted yesterday.  Patient transitioned to comfort care.    Acute kidney injury/hypernatremia Patient had normal creatinine on July 27.  Presented with elevated BUN of 94 and creatinine of 3.22.  Most likely this is secondary to poor oral intake secondary to his underlying medical problems.  Lactic acidosis   Thrombocytopenia Low counts could be due to acute illness.  No evidence of overt bleeding.    Diabetes mellitus type 2, uncontrolled with hyperglycemia   History of Parkinson's disease with dementia Appears to be primary reason for his frequent decompensation.  History of peripheral vascular disease Stable.  History of chronic subdural hematoma Stable.  Goals of care Patient transitioned to comfort care after discussion between family and palliative care.    Pressure injury stage II Pressure Injury 02/21/21 Buttocks Left;Medial Stage 2 -  Partial thickness loss of dermis presenting as a shallow open injury with a red, pink wound bed without slough. nickel sized, pink wound base, no drainage noted (Active)  02/21/21 1845  Location: Buttocks  Location Orientation: Left;Medial  Staging: Stage 2 -  Partial thickness loss of dermis presenting as a shallow open injury with a red, pink wound bed without slough.  Wound Description (Comments): nickel sized, pink wound base, no drainage noted  Present on Admission: Yes     Pressure Injury 02/21/21 Buttocks Right;Medial Stage 2 -  Partial thickness loss of dermis presenting as a shallow open injury with a red, pink wound bed  without slough. pencil eraser sized, pink base, no drainage (Active)  02/21/21 1845  Location: Buttocks  Location Orientation: Right;Medial  Staging: Stage 2 -  Partial thickness loss of dermis presenting as a shallow open injury with a red, pink wound bed without slough.  Wound Description (Comments): pencil eraser sized, pink base, no drainage  Present on Admission: Yes    DVT Prophylaxis: SCDs Code Status: DNR Family Communication: No family at bedside Disposition Plan: To be determined  Status is: Inpatient  Remains inpatient appropriate because:Altered mental status  Dispo: The patient is from: SNF              Anticipated d/c is to: SNF              Patient currently is not medically stable to d/c.   Difficult to place patient No       Medications: Scheduled:   Continuous:   PRN:   Objective:  Vital Signs  Vitals:   03/06/21 0810 03/06/21 0820 03/06/21 1436 Mar 30, 2021 1336  BP:   120/62 93/69  Pulse: (!) 104 100 (!) 106 82  Resp:   18 18  Temp:   98.8 F (37.1 C) 98.1 F (36.7 C)  TempSrc:   Oral   SpO2:   97% 93%  Weight:      Height:        Intake/Output Summary (Last 24 hours) at 30-Mar-2021 1456 Last data filed at Mar 30, 2021 1300 Gross per 24 hour  Intake 500 ml  Output 1001 ml  Net -501 ml    Filed Weights   03/05/21 1532 03/06/21 0700  Weight: 83 kg 72.1 kg   Patient is unresponsive.  Moaning occasionally.  Lab Results:  Data Reviewed: I have personally reviewed following labs and imaging studies  CBC: Recent Labs  Lab 03/05/21 1622 03/06/21 0532  WBC 23.1* 17.6*  NEUTROABS 17.0*  --   HGB 15.4 12.9*  HCT 49.0 44.1  MCV 91.6 96.7  PLT 142* 84*     Basic Metabolic Panel: Recent Labs  Lab 03/05/21 1622 03/06/21 0019 03/06/21 0532 03/06/21 0612  NA 148* 152*  --  154*  K 4.5 4.5  --  4.1  CL 116* 123*  --  125*  CO2 20* 21*  --  21*  GLUCOSE 294* 246*  --  184*  BUN 94* 83*  --  73*  CREATININE 3.22* 2.71*  --   2.12*  CALCIUM 9.3 8.1*  --  8.3*  MG  --   --  2.3  --   PHOS  --   --  5.2*  --      GFR: Estimated Creatinine Clearance: 24.1 mL/min (A) (by C-G formula based on SCr of 2.12 mg/dL (H)).  Liver Function Tests: Recent Labs  Lab 03/05/21 1622 03/06/21 0612  AST 28 40  ALT 12 22  ALKPHOS 69 58  BILITOT 1.1 0.8  PROT 7.7 6.0*  ALBUMIN 4.2 3.2*      Coagulation Profile: Recent Labs  Lab  03/05/21 1622 03/06/21 0532  INR 1.1 1.2      CBG: Recent Labs  Lab 03/05/21 2210 03/06/21 0136 03/06/21 0724 03/06/21 1105  GLUCAP 235* 213* 146* 161*      Recent Results (from the past 240 hour(s))  Urine Culture     Status: None   Collection Time: 02/26/21 10:51 AM   Specimen: Urine, Clean Catch  Result Value Ref Range Status   Specimen Description   Final    URINE, CLEAN CATCH Performed at [facility], [address], [place], [place] [address]    Special Requests   Final    NONE Performed at [facility], [address] [place], [place] [address]    Culture   Final    NO GROWTH Performed at [facility], [address] [place], [place] [address]    Report Status 02/28/2021 FINAL  Final  Culture, blood (routine x 2)     Status: None (Preliminary result)   Collection Time: 03/05/21  4:22 PM   Specimen: BLOOD LEFT HAND  Result Value Ref Range Status   Specimen Description BLOOD LEFT HAND  Final   Special Requests   Final    BOTTLES DRAWN AEROBIC AND ANAEROBIC Blood Culture adequate volume   Culture   Final    NO GROWTH < 24 HOURS Performed at [facility], [address] [place], [place] [address]    Report Status PENDING  Incomplete  Culture, blood (routine x 2)     Status: None (Preliminary result)   Collection Time: 03/05/21  4:22 PM   Specimen: Right Antecubital; Blood  Result Value Ref Range Status   Specimen Description RIGHT ANTECUBITAL  Final   Special Requests   Final    BOTTLES DRAWN AEROBIC AND ANAEROBIC Blood Culture adequate volume    Culture   Final    NO GROWTH < 24 HOURS Performed at [facility], [address], [place], [place] [address]    Report Status PENDING  Incomplete  Resp Panel by RT-PCR (Flu A&B, Covid) Nasopharyngeal Swab     Status: None   Collection Time: 03/05/21  7:02 PM   Specimen: Nasopharyngeal Swab; Nasopharyngeal(NP) swabs in vial transport medium  Result Value Ref Range Status   SARS Coronavirus 2 by RT PCR NEGATIVE NEGATIVE Final    Comment: (NOTE) SARS-CoV-2 target nucleic acids are NOT DETECTED.  The SARS-CoV-2 RNA is generally detectable in upper respiratory specimens during the acute phase of infection. The lowest concentration of SARS-CoV-2 viral copies this assay can detect is 138 copies/mL. A negative result does not preclude SARS-Cov-2 infection and should not be used as the sole basis for treatment or other patient management decisions. A negative result may occur with  improper specimen collection/handling, submission of specimen other than nasopharyngeal swab, presence of viral mutation(s) within the areas targeted by this assay, and inadequate number of viral copies(<138 copies/mL). A negative result must be combined with clinical observations, patient history, and epidemiological information. The expected result is Negative.  Fact Sheet for Patients:  BloggerCourse.com  Fact Sheet for Healthcare Providers:  SeriousBroker.it  This test is no t yet approved or cleared by the [place] FDA and  has been authorized for detection and/or diagnosis of SARS-CoV-2 by FDA under an Emergency Use Authorization (EUA). This EUA will remain  in effect (meaning this test can be used) for the duration of the COVID-19 declaration under Section 564(b)(1) of the Act, 21 U.S.C.section 360bbb-3(b)(1), unless the authorization is terminated  or revoked sooner.       Influenza A by PCR NEGATIVE NEGATIVE Final   Influenza B by PCR  NEGATIVE NEGATIVE Final    Comment: (NOTE) The Xpert Xpress SARS-CoV-2/FLU/RSV plus assay is intended as an aid in the diagnosis of influenza from Nasopharyngeal swab specimens and should not be used as a sole basis for treatment. Nasal washings and aspirates are unacceptable for Xpert Xpress SARS-CoV-2/FLU/RSV testing.  Fact Sheet for Patients: BloggerCourse.com  Fact Sheet for Healthcare Providers: SeriousBroker.it  This test is not yet approved or cleared by the [place] FDA and has been authorized for detection and/or diagnosis of SARS-CoV-2 by FDA under an Emergency Use Authorization (EUA). This EUA will remain in effect (meaning this test can be used) for the duration of the COVID-19 declaration under Section 564(b)(1) of the Act, 21 U.S.C. section 360bbb-3(b)(1), unless the authorization is terminated or revoked.  Performed at [facility], [address], [place], [place] [address]        Radiology Studies: CT HEAD WO CONTRAST  Result Date: 03/05/2021 CLINICAL DATA:  Altered level of consciousness today EXAM: CT HEAD WITHOUT CONTRAST TECHNIQUE: Contiguous axial images were obtained from the base of the skull through the vertex without intravenous contrast. COMPARISON:  02/24/2021 FINDINGS: Brain: No acute infarct or hemorrhage. There are stable chronic bilateral subdural hematomas measuring up to 3 mm each, without mass effect. Chronic small vessel ischemic changes are seen within the periventricular white matter. The lateral ventricles and midline structures are unremarkable. Vascular: No hyperdense vessel or unexpected calcification. Skull: Normal. Negative for fracture or focal lesion. Sinuses/Orbits: Near complete opacification of the left sphenoid sinus. Remaining sinuses are clear. Other: None. IMPRESSION: 1. Stable chronic bilateral subdural hematomas measuring 3 mm, without mass effect. 2. No acute intracranial  process. 3. Sphenoid sinus disease. Electronically Signed   By: Sharlet Salina M.D.   On: 03/05/2021 18:57   MR BRAIN WO CONTRAST  Result Date: 03/06/2021 CLINICAL DATA:  Neuro deficit, acute, stroke suspected EXAM: MRI HEAD WITHOUT CONTRAST TECHNIQUE: Multiplanar, multiecho pulse sequences of the brain and surrounding structures were obtained without intravenous contrast. COMPARISON:  2019 FINDINGS: DWI, sagittal T1, GRE, and axial T2 sequences were attempted. Motion artifact is present. Patient could not tolerate remainder of study. Brain: There is no acute infarction or intracranial hemorrhage. No intracranial mass or mass effect. Suspect trace bilateral cerebral convexity chronic subdural hematomas. Prominence of the ventricles and sulci reflects generalized parenchymal volume loss patchy and confluent areas of T2 hyperintensity in the supratentorial white matter are nonspecific but may reflect chronic microvascular ischemic changes. Vascular: Major vessel flow voids at the skull base are preserved. Skull and upper cervical spine: Normal marrow signal is preserved. Sinuses/Orbits: Left sphenoid sinus inflammatory changes. Bilateral lens replacements. Other: Sella is unremarkable.  Chronic left mastoid effusion. IMPRESSION: No acute infarction. Chronic microvascular ischemic changes. Trace bilateral cerebral convexity chronic subdural hematomas. Electronically Signed   By: Guadlupe Spanish M.D.   On: 03/06/2021 08:50   DG Chest Port 1 View  Result Date: 03/05/2021 CLINICAL DATA:  Altered level of consciousness EXAM: PORTABLE CHEST 1 VIEW COMPARISON:  02/21/2021 FINDINGS: Single frontal view of the chest demonstrates an unremarkable cardiac silhouette. No airspace disease, effusion, or pneumothorax. No acute bony abnormalities. Stable degenerative and postsurgical changes of the shoulders. IMPRESSION: 1. No acute intrathoracic process. Electronically Signed   By: Sharlet Salina M.D.   On: 03/05/2021 17:14        LOS: 2 days  [name] [facility]  [facility] Web designer on [email or web address]  03-18-21, 2:56 PM

## 2021-03-07 NOTE — Care Management Important Message (Signed)
Important Message  Patient Details  Name: [name] MRN: [id number] Date of Birth: October 05, 1929   Medicare Important Message Given:  Yes     Corey Harold 04/03/2021, 11:58 AM

## 2021-03-07 NOTE — Progress Notes (Signed)
Palliative: Mr. Bollard is lying quietly in bed.  He is now full comfort care, and overall appears comfortable.  He does not interact with me in any meaningful way when I call his name.  He is unable to make his basic needs known.  There is no family at bedside at this time.  Family has elected comfort and dignity at end-of-life, residential hospice with [facility].  He has been accepted by hospice of [place], but there are no beds, therefore he will be transition to general inpatient status.  Medications reviewed.  Conference with attending, bedside nursing staff, transition of care team related to patient condition, needs, goals of care, hospice care.  Plan: Comfort and dignity at end-of-life, requesting residential hospice care at [facility].  End-of-life order set implemented.  25 minutes Lillia Carmel, NP Palliative medicine team Team phone [phone] Greater than 50% of this time was spent counseling and coordinating care related to the above assessment and plan.

## 2021-03-07 NOTE — H&P (Signed)
[facility] History and Physical  [name] VHQ:[id number] DOB: [birth year] DOA: 03/18/2021   PCP: Marylynn PearsonMcCorkle, Tenika, FNP  Specialists: None  Chief Complaint: unresponsive  HPI: [name] is a 87 y.o. male with medical history significant for IDDM, GERD, Parkinson disease, essential hypertension and dementia who [facility] due to altered mental status.  Patient was recently hospitalized about a week ago with a similar issues.  At that time symptoms were thought to be due to UTI and underlying dementia.  He was discharged to skilled nursing facility.  In the emergency department patient was started on broad-spectrum antibiotics and was hospitalized again. Subsequently transitioned to comfort care and to hospice.  Home Medications: Prior to Admission medications   Medication Sig Start Date End Date Taking? Authorizing Provider  bisacodyl (DULCOLAX) 10 MG suppository Place 1 suppository (10 mg total) rectally daily as needed for moderate constipation. 04/03/2021   Osvaldo ShipperKrishnan, Heraclio Seidman, MD  glycopyrrolate (ROBINUL) 0.2 MG/ML injection Inject 1 mL (0.2 mg total) into the skin every 4 (four) hours as needed (excessive secretions). 03/09/2021   Osvaldo ShipperKrishnan, Brandalyn Harting, MD  glycopyrrolate (ROBINUL) 0.2 MG/ML injection Inject 1 mL (0.2 mg total) into the vein every 4 (four) hours as needed (excessive secretions). 03/09/2021   Osvaldo ShipperKrishnan, Bakari Nikolai, MD  glycopyrrolate (ROBINUL) 1 MG tablet Take 1 tablet (1 mg total) by mouth every 4 (four) hours as needed (excessive secretions). 03/22/2021   Osvaldo ShipperKrishnan, Shelley Cocke, MD  haloperidol (HALDOL) 0.5 MG tablet Take 1 tablet (0.5 mg total) by mouth every 4 (four) hours as needed for agitation (or delirium). 03/04/2021   Osvaldo ShipperKrishnan, Charlies Rayburn, MD  haloperidol lactate (HALDOL) 5 MG/ML injection Inject 0.1 mLs (0.5 mg total) into the vein every 4 (four) hours as needed (or delirium). 03/29/2021   Osvaldo ShipperKrishnan, Leemon Ayala, MD  LORazepam (ATIVAN) 1 MG tablet Take 1  tablet (1 mg total) by mouth every 4 (four) hours as needed for anxiety. 04/02/2021   Osvaldo ShipperKrishnan, Fraida Veldman, MD  LORazepam (ATIVAN) 2 MG/ML injection Inject 0.5 mLs (1 mg total) into the vein every 4 (four) hours as needed for anxiety. 03/19/2021   Osvaldo ShipperKrishnan, Laderius Valbuena, MD  Morphine Sulfate (MORPHINE CONCENTRATE) 10 MG/0.5ML SOLN concentrated solution Take 0.13-0.25 mLs (2.6-5 mg total) by mouth every 2 (two) hours as needed for moderate pain, anxiety or shortness of breath (EOL care). 03/05/2021   Osvaldo ShipperKrishnan, Lorenso Quirino, MD  ondansetron [facility](ZOFRAN) 4 MG/2ML SOLN injection Inject 2 mLs (4 mg total) into the vein every 6 (six) hours as needed for nausea. 04/02/2021   Osvaldo ShipperKrishnan, Evonna Stoltz, MD  ondansetron (ZOFRAN-ODT) 4 MG disintegrating tablet Take 1 tablet (4 mg total) by mouth every 6 (six) hours as needed for nausea. 03/24/2021   Osvaldo ShipperKrishnan, Sriansh Farra, MD  polyvinyl alcohol (LIQUIFILM TEARS) 1.4 % ophthalmic solution Place 1 drop into both eyes 4 (four) times daily as needed for dry eyes. 03/07/21   Osvaldo ShipperKrishnan, Ryken Paschal, MD    Allergies: No Known Allergies  Past Medical History: Past Medical History:  Diagnosis Date   Arthritis    Dementia (HCC)    early stages   Diabetes mellitus    Eosinophilic colitis    GERD (gastroesophageal reflux disease)    HOH (hard of hearing)    Hypercholesterolemia    Hypertension     Past Surgical History:  Procedure Laterality Date   CATARACT EXTRACTION W/PHACO Left 03/07/2014   Procedure: CATARACT EXTRACTION PHACO AND INTRAOCULAR LENS PLACEMENT LEFT EYE CDE=7.11;  Surgeon: [name], MD;  Location: [facility];  Service: Ophthalmology;  Laterality: Left;   CATARACT EXTRACTION W/PHACO Right 03/14/2014   Procedure: CATARACT EXTRACTION PHACO AND INTRAOCULAR LENS PLACEMENT RIGHT EYE CDE=10.62;  Surgeon: Loraine Leriche T. Nile Riggs, MD;  Location: [facility];  Service: Ophthalmology;  Laterality: Right;   COLONOSCOPY  2009   Dr. Darrick Penna: pancolonic diverticulosis, internal hemorrhoids   COLONOSCOPY N/A 03/29/2015   RMR: inflammatory  changes in the proximal rectum, colon and  terminal ileum suspicious for inflammatory bowel disease. ie. primarily Crohns colitis. Status post biopsy as described. concomitant NSAID exposure could excaerbate pre-existing inflammatory bowel disease but I do not think todays finding are primarly related to recent ibuprofen use.    KNEE ARTHROSCOPY Left    RESECTION DISTAL CLAVICAL Left 10/15/2012   Procedure: RESECTION DISTAL CLAVICAL;  Surgeon: Vickki Hearing, MD;  Location: [facility];  Service: Orthopedics;  Laterality: Left;   SHOULDER ACROMIOPLASTY Left 10/15/2012   Procedure: SHOULDER ACROMIOPLASTY;  Surgeon: Vickki Hearing, MD;  Location: [facility];  Service: Orthopedics;  Laterality: Left;   SHOULDER ARTHROSCOPY WITH OPEN ROTATOR CUFF REPAIR Left 10/15/2012   Procedure: SHOULDER ARTHROSCOPY WITH OPEN ROTATOR CUFF REPAIR;  Surgeon: Vickki Hearing, MD;  Location: [facility];  Service: Orthopedics;  Laterality: Left;  open at 0912; with bicep tendon debridement;    SHOULDER OPEN ROTATOR CUFF REPAIR Right 08/20/2017   Procedure: ROTATOR CUFF REPAIR SHOULDER OPEN with removal of distal clavicle;  Surgeon: Vickki Hearing, MD;  Location: [facility];  Service: Orthopedics;  Laterality: Right;   TONSILLECTOMY      Social History: Unable to obtain due to dementia   Family History:  Family History  Problem Relation Age of Onset   Arthritis Other    Cancer Other    Diabetes Other    Cancer Mother    Colon cancer Neg Hx      Review of Systems - Unable to obtain due to dementia  Physical Examination Patietn is unresponsive. Moaning occasional.  Coarse breath sounds bilaterally. S1S2 normal regular. Abdomen is soft.   Labs on Admission: I have personally reviewed following labs and imaging studies  CBC: Recent Labs  Lab 03/05/21 1622 03/06/21 0532  WBC 23.1* 17.6*  NEUTROABS 17.0*  --   HGB 15.4 12.9*  HCT 49.0 44.1  MCV 91.6 96.7  PLT 142* 84*   Basic Metabolic Panel: Recent  Labs  Lab 03/05/21 1622 03/06/21 0019 03/06/21 0532 03/06/21 0612  NA 148* 152*  --  154*  K 4.5 4.5  --  4.1  CL 116* 123*  --  125*  CO2 20* 21*  --  21*  GLUCOSE 294* 246*  --  184*  BUN 94* 83*  --  73*  CREATININE 3.22* 2.71*  --  2.12*  CALCIUM 9.3 8.1*  --  8.3*  MG  --   --  2.3  --   PHOS  --   --  5.2*  --    GFR: Estimated Creatinine Clearance: 24.1 mL/min (A) (by C-G formula based on SCr of 2.12 mg/dL (H)). Liver Function Tests: Recent Labs  Lab 03/05/21 1622 03/06/21 0612  AST 28 40  ALT 12 22  ALKPHOS 69 58  BILITOT 1.1 0.8  PROT 7.7 6.0*  ALBUMIN 4.2 3.2*    Coagulation Profile: Recent Labs  Lab 03/05/21 1622 03/06/21 0532  INR 1.1 1.2    CBG: Recent Labs  Lab 03/05/21 2210 03/06/21 0136 03/06/21 0724 03/06/21 1105  GLUCAP 235* 213* 146* 161*  Radiological Exams on Admission: CT HEAD WO CONTRAST  Result Date: 03/05/2021 CLINICAL DATA:  Altered level of consciousness today EXAM: CT HEAD WITHOUT CONTRAST TECHNIQUE: Contiguous axial images were obtained from the base of the skull through the vertex without intravenous contrast. COMPARISON:  02/24/2021 FINDINGS: Brain: No acute infarct or hemorrhage. There are stable chronic bilateral subdural hematomas measuring up to 3 mm each, without mass effect. Chronic small vessel ischemic changes are seen within the periventricular white matter. The lateral ventricles and midline structures are unremarkable. Vascular: No hyperdense vessel or unexpected calcification. Skull: Normal. Negative for fracture or focal lesion. Sinuses/Orbits: Near complete opacification of the left sphenoid sinus. Remaining sinuses are clear. Other: None. IMPRESSION: 1. Stable chronic bilateral subdural hematomas measuring 3 mm, without mass effect. 2. No acute intracranial process. 3. Sphenoid sinus disease. Electronically Signed   By: Sharlet Salina M.D.   On: 03/05/2021 18:57   MR BRAIN WO CONTRAST  Result Date:  03/06/2021 CLINICAL DATA:  Neuro deficit, acute, stroke suspected EXAM: MRI HEAD WITHOUT CONTRAST TECHNIQUE: Multiplanar, multiecho pulse sequences of the brain and surrounding structures were obtained without intravenous contrast. COMPARISON:  2019 FINDINGS: DWI, sagittal T1, GRE, and axial T2 sequences were attempted. Motion artifact is present. Patient could not tolerate remainder of study. Brain: There is no acute infarction or intracranial hemorrhage. No intracranial mass or mass effect. Suspect trace bilateral cerebral convexity chronic subdural hematomas. Prominence of the ventricles and sulci reflects generalized parenchymal volume loss patchy and confluent areas of T2 hyperintensity in the supratentorial white matter are nonspecific but may reflect chronic microvascular ischemic changes. Vascular: Major vessel flow voids at the skull base are preserved. Skull and upper cervical spine: Normal marrow signal is preserved. Sinuses/Orbits: Left sphenoid sinus inflammatory changes. Bilateral lens replacements. Other: Sella is unremarkable.  Chronic left mastoid effusion. IMPRESSION: No acute infarction. Chronic microvascular ischemic changes. Trace bilateral cerebral convexity chronic subdural hematomas. Electronically Signed   By: Guadlupe Spanish M.D.   On: 03/06/2021 08:50   DG Chest Port 1 View  Result Date: 03/05/2021 CLINICAL DATA:  Altered level of consciousness EXAM: PORTABLE CHEST 1 VIEW COMPARISON:  02/21/2021 FINDINGS: Single frontal view of the chest demonstrates an unremarkable cardiac silhouette. No airspace disease, effusion, or pneumothorax. No acute bony abnormalities. Stable degenerative and postsurgical changes of the shoulders. IMPRESSION: 1. No acute intrathoracic process. Electronically Signed   By: Sharlet Salina M.D.   On: 03/05/2021 17:14       Assessment/Plan:   Acute metabolic encephalopathy/SIRS Recently hospitalized for similar presentation.  At that time he was found to have  UTI.  At the time of presentation he was noted to be tachycardic.  Had elevated WBC of 23.1.  However urine analysis was unimpressive for UTI.  Empirically started on vancomycin and cefepime.  Procalcitonin only 0.19.  Lactic acid level was 3.7 improving to 2.7.  Influenza and COVID-19 test were negative.  Patient's antibacterials were continued.  Discussions were held with patient's family.  Palliative care was consulted again.  After discussions with family patient was changed over to comfort care. Comfort care measures in place.   Acute kidney injury/hypernatremia Patient had normal creatinine on July 27.  Presented with elevated BUN of 94 and creatinine of 3.22.  Most likely this is secondary to poor oral intake.  This is all secondary to his underlying Parkinson's disease and advanced dementia.    Lactic acidosis   Thrombocytopenia Low counts could be due to acute illness.  No evidence  of overt bleeding.     Diabetes mellitus type 2, uncontrolled with hyperglycemia     History of Parkinson's disease with dementia Appears to be primary reason for his frequent decompensation.   History of peripheral vascular disease Stable.  History of chronic subdural hematoma Stable.    Pressure injury stage II Pressure Injury 02/21/21 Buttocks Left;Medial Stage 2 -  Partial thickness loss of dermis presenting as a shallow open injury with a red, pink wound bed without slough. nickel sized, pink wound base, no drainage noted (Active)  02/21/21 1845  Location: Buttocks  Location Orientation: Left;Medial  Staging: Stage 2 -  Partial thickness loss of dermis presenting as a shallow open injury with a red, pink wound bed without slough.  Wound Description (Comments): nickel sized, pink wound base, no drainage noted  Present on Admission: Yes     Pressure Injury 02/21/21 Buttocks Right;Medial Stage 2 -  Partial thickness loss of dermis presenting as a shallow open injury with a red, pink wound bed  without slough. pencil eraser sized, pink base, no drainage (Active)  02/21/21 1845  Location: Buttocks  Location Orientation: Right;Medial  Staging: Stage 2 -  Partial thickness loss of dermis presenting as a shallow open injury with a red, pink wound bed without slough.  Wound Description (Comments): pencil eraser sized, pink base, no drainage  Present on Admission: Yes       DVT Prophylaxis: Comfort care Code Status: DNR  Admission Status: Hospice inpatient  Odysseus Cada [facility]  [facility] Pager on [email or web address]  03/16/21, 4:21 PM

## 2021-03-07 NOTE — TOC Progression Note (Signed)
Transition of Care [facility]) - Progression Note    Patient Details  Name: [name] MRN: [id number] Date of Birth: [birth year]  Transition of [facility] CM/SW Contact  Elliot Gault, LCSW Phone Number: 03/09/2021, 11:39 AM  Clinical Narrative:     TOC following. Per Lelon Mast at [facility], there are no residential hospice beds today. Hospice RN will be here at [facility] between 1:30-2:00 to assess and complete GIP admission. Hospice RN will notify treatment team when she has completed her process and then pt's status can be changed to GIP.  Updated MD and RN. TOC will follow.    Barriers to Discharge: Continued Medical Work up  Expected Discharge Plan and Services           Expected Discharge Date: 03/17/2021                                     Social Determinants of Health (SDOH) Interventions    Readmission Risk Interventions Readmission Risk Prevention Plan 03/06/2021  Transportation Screening Complete  HRI or Home Care Consult Complete  Social Work Consult for Recovery Care Planning/Counseling Complete  Palliative Care Screening Not Applicable  Medication Review Oceanographer) Complete  Some recent data might be hidden

## 2021-03-07 NOTE — Discharge Summary (Signed)
[facility]  Physician Discharge Summary   Patient ID: [name] MRN: [id number] DOB/AGE: 1930-05-01 87 y.o.  Admit date: 03/05/2021 Discharge date:   03/11/2021   PCP: [name], FNP  DISCHARGE DIAGNOSES:  Advanced dementia in the setting of Parkinson's disease Acute metabolic encephalopathy Acute kidney injury Hypernatremia Thrombocytopenia Diabetes mellitus type 2, uncontrolled with hyperglycemia History of peripheral vascular disease Chronic subdural hematoma    CODE STATUS: DNR  DISCHARGE CONDITION: poor  Diet recommendation: Comfort feeds  INITIAL HISTORY: 87 y.o. male with medical history significant for IDDM, GERD, Parkinson disease, essential hypertension and dementia who presented to the emergency department from [facility] via EMS due to altered mental status.  Patient was recently hospitalized about a week ago with a similar issues.  At that time symptoms were thought to be due to UTI and underlying dementia.  He was discharged to skilled nursing facility.  In the emergency department patient was started on broad-spectrum antibiotics and was hospitalized again.  Consultations: Palliative care   HOSPITAL COURSE:   Acute metabolic encephalopathy/SIRS Recently hospitalized for similar presentation.  At that time he was found to have UTI.  At the time of presentation he was noted to be tachycardic.  Had elevated WBC of 23.1.  However urine analysis was unimpressive for UTI.  Empirically started on vancomycin and cefepime.  Procalcitonin only 0.19.  Lactic acid level was 3.7 improving to 2.7.  Influenza and COVID-19 test were negative.  Patient's antibacterials were continued.  Discussions were held with patient's family.  Palliative care was consulted again.  After discussions patient was changed over to comfort care.   Acute kidney injury/hypernatremia Patient had normal creatinine on July 27.  Presented with elevated BUN of 94 and creatinine of  3.22.  Most likely this is secondary to poor oral intake.  This is all secondary to his underlying Parkinson's disease and advanced dementia.    Lactic acidosis   Thrombocytopenia Low counts could be due to acute illness.  No evidence of overt bleeding.     Diabetes mellitus type 2, uncontrolled with hyperglycemia    History of Parkinson's disease with dementia Appears to be primary reason for his frequent decompensation.   History of peripheral vascular disease Stable.  History of chronic subdural hematoma Stable.    Pressure injury stage II Pressure Injury 02/21/21 Buttocks Left;Medial Stage 2 -  Partial thickness loss of dermis presenting as a shallow open injury with a red, pink wound bed without slough. nickel sized, pink wound base, no drainage noted (Active)  02/21/21 1845  Location: Buttocks  Location Orientation: Left;Medial  Staging: Stage 2 -  Partial thickness loss of dermis presenting as a shallow open injury with a red, pink wound bed without slough.  Wound Description (Comments): nickel sized, pink wound base, no drainage noted  Present on Admission: Yes     Pressure Injury 02/21/21 Buttocks Right;Medial Stage 2 -  Partial thickness loss of dermis presenting as a shallow open injury with a red, pink wound bed without slough. pencil eraser sized, pink base, no drainage (Active)  02/21/21 1845  Location: Buttocks  Location Orientation: Right;Medial  Staging: Stage 2 -  Partial thickness loss of dermis presenting as a shallow open injury with a red, pink wound bed without slough.  Wound Description (Comments): pencil eraser sized, pink base, no drainage  Present on Admission: Yes       PERTINENT LABS:  The results of significant diagnostics from this hospitalization (including imaging, microbiology, ancillary and laboratory)  are listed below for reference.    Microbiology: Recent Results (from the past 240 hour(s))  Urine Culture     Status: None    Collection Time: 02/26/21 10:51 AM   Specimen: Urine, Clean Catch  Result Value Ref Range Status   Specimen Description   Final    URINE, CLEAN CATCH Performed at [facility], [address] [place], [place] [address]    Special Requests   Final    NONE Performed at [facility], [address] [place], [place] [address]    Culture   Final    NO GROWTH Performed at [facility], [address] [place], [place] [address]    Report Status 02/28/2021 FINAL  Final  Culture, blood (routine x 2)     Status: None (Preliminary result)   Collection Time: 03/05/21  4:22 PM   Specimen: BLOOD LEFT HAND  Result Value Ref Range Status   Specimen Description BLOOD LEFT HAND  Final   Special Requests   Final    BOTTLES DRAWN AEROBIC AND ANAEROBIC Blood Culture adequate volume   Culture   Final    NO GROWTH < 24 HOURS Performed at [facility], [address], [place], [place] [address]    Report Status PENDING  Incomplete  Culture, blood (routine x 2)     Status: None (Preliminary result)   Collection Time: 03/05/21  4:22 PM   Specimen: Right Antecubital; Blood  Result Value Ref Range Status   Specimen Description RIGHT ANTECUBITAL  Final   Special Requests   Final    BOTTLES DRAWN AEROBIC AND ANAEROBIC Blood Culture adequate volume   Culture   Final    NO GROWTH < 24 HOURS Performed at [facility], [address], [place], [place] [address]    Report Status PENDING  Incomplete  Resp Panel by RT-PCR (Flu A&B, Covid) Nasopharyngeal Swab     Status: None   Collection Time: 03/05/21  7:02 PM   Specimen: Nasopharyngeal Swab; Nasopharyngeal(NP) swabs in vial transport medium  Result Value Ref Range Status   SARS Coronavirus 2 by RT PCR NEGATIVE NEGATIVE Final    Comment: (NOTE) SARS-CoV-2 target nucleic acids are NOT DETECTED.  The SARS-CoV-2 RNA is generally detectable in upper respiratory specimens during the acute phase of infection. The lowest concentration of SARS-CoV-2 viral  copies this assay can detect is 138 copies/mL. A negative result does not preclude SARS-Cov-2 infection and should not be used as the sole basis for treatment or other patient management decisions. A negative result may occur with  improper specimen collection/handling, submission of specimen other than nasopharyngeal swab, presence of viral mutation(s) within the areas targeted by this assay, and inadequate number of viral copies(<138 copies/mL). A negative result must be combined with clinical observations, patient history, and epidemiological information. The expected result is Negative.  Fact Sheet for Patients:  BloggerCourse.com  Fact Sheet for Healthcare Providers:  SeriousBroker.it  This test is no t yet approved or cleared by the [place] FDA and  has been authorized for detection and/or diagnosis of SARS-CoV-2 by FDA under an Emergency Use Authorization (EUA). This EUA will remain  in effect (meaning this test can be used) for the duration of the COVID-19 declaration under Section 564(b)(1) of the Act, 21 U.S.C.section 360bbb-3(b)(1), unless the authorization is terminated  or revoked sooner.       Influenza A by PCR NEGATIVE NEGATIVE Final   Influenza B by PCR NEGATIVE NEGATIVE Final  Comment: (NOTE) The Xpert Xpress SARS-CoV-2/FLU/RSV plus assay is intended as an aid in the diagnosis of influenza from Nasopharyngeal swab specimens and should not be used as a sole basis for treatment. Nasal washings and aspirates are unacceptable for Xpert Xpress SARS-CoV-2/FLU/RSV testing.  Fact Sheet for Patients: BloggerCourse.com  Fact Sheet for Healthcare Providers: SeriousBroker.it  This test is not yet approved or cleared by the [place] FDA and has been authorized for detection and/or diagnosis of SARS-CoV-2 by FDA under an Emergency Use Authorization (EUA). This  EUA will remain in effect (meaning this test can be used) for the duration of the COVID-19 declaration under Section 564(b)(1) of the Act, 21 U.S.C. section 360bbb-3(b)(1), unless the authorization is terminated or revoked.  Performed at [facility], [address] [place], [place] [address]      Labs:  COVID-19 Labs   Lab Results  Component Value Date   SARSCOV2NAA NEGATIVE 03/05/2021   SARSCOV2NAA NEGATIVE 02/21/2021      Basic Metabolic Panel: Recent Labs  Lab 03/05/21 1622 03/06/21 0019 03/06/21 0532 03/06/21 0612  NA 148* 152*  --  154*  K 4.5 4.5  --  4.1  CL 116* 123*  --  125*  CO2 20* 21*  --  21*  GLUCOSE 294* 246*  --  184*  BUN 94* 83*  --  73*  CREATININE 3.22* 2.71*  --  2.12*  CALCIUM 9.3 8.1*  --  8.3*  MG  --   --  2.3  --   PHOS  --   --  5.2*  --    Liver Function Tests: Recent Labs  Lab 03/05/21 1622 03/06/21 0612  AST 28 40  ALT 12 22  ALKPHOS 69 58  BILITOT 1.1 0.8  PROT 7.7 6.0*  ALBUMIN 4.2 3.2*    CBC: Recent Labs  Lab 03/05/21 1622 03/06/21 0532  WBC 23.1* 17.6*  NEUTROABS 17.0*  --   HGB 15.4 12.9*  HCT 49.0 44.1  MCV 91.6 96.7  PLT 142* 84*     CBG: Recent Labs  Lab 03/05/21 2210 03/06/21 0136 03/06/21 0724 03/06/21 1105  GLUCAP 235* 213* 146* 161*     IMAGING STUDIES CT HEAD WO CONTRAST  Result Date: 03/05/2021 CLINICAL DATA:  Altered level of consciousness today EXAM: CT HEAD WITHOUT CONTRAST TECHNIQUE: Contiguous axial images were obtained from the base of the skull through the vertex without intravenous contrast. COMPARISON:  02/24/2021 FINDINGS: Brain: No acute infarct or hemorrhage. There are stable chronic bilateral subdural hematomas measuring up to 3 mm each, without mass effect. Chronic small vessel ischemic changes are seen within the periventricular white matter. The lateral ventricles and midline structures are unremarkable. Vascular: No hyperdense vessel or unexpected calcification. Skull:  Normal. Negative for fracture or focal lesion. Sinuses/Orbits: Near complete opacification of the left sphenoid sinus. Remaining sinuses are clear. Other: None. IMPRESSION: 1. Stable chronic bilateral subdural hematomas measuring 3 mm, without mass effect. 2. No acute intracranial process. 3. Sphenoid sinus disease. Electronically Signed   By: Sharlet Salina M.D.   On: 03/05/2021 18:57   CT HEAD WO CONTRAST  Result Date: 02/24/2021 CLINICAL DATA:  Follow-up subdural collection EXAM: CT HEAD WITHOUT CONTRAST TECHNIQUE: Contiguous axial images were obtained from the base of the skull through the vertex without intravenous contrast. COMPARISON:  Three days ago FINDINGS: Brain: Bilateral intermediate to low-density subdural collection/thickening measuring up to 3 mm in thickness and not causing any mass effect on the atrophic brain. No high-density/acute  hemorrhage. No evidence of infarct, mass, or hydrocephalus. Generalized atrophy and chronic small vessel ischemia Vascular: No hyperdense vessel or unexpected calcification. Skull: Normal. Negative for fracture or focal lesion. Sinuses/Orbits: No acute finding. Mucosal thickening in the left sphenoid sinus. IMPRESSION: Stable nonacute bilateral subdural hematoma without cerebral mass effect. No new abnormality. Electronically Signed   By: Marnee SpringJonathon  Watts M.D.   On: 02/24/2021 10:15   CT Head Wo Contrast  Result Date: 02/21/2021 CLINICAL DATA:  Dementia, Parkinson's disease, hypoglycemia, altered level of consciousness EXAM: CT HEAD WITHOUT CONTRAST TECHNIQUE: Contiguous axial images were obtained from the base of the skull through the vertex without intravenous contrast. COMPARISON:  06/05/2020 FINDINGS: Brain: No acute infarct or hemorrhage. Chronic small vessel ischemic changes are seen within the periventricular white matter. Lateral ventricles and midline structures are unremarkable. Stable bilateral less than 3 mm chronic subdural hematomas are again noted  along the convexities. No acute extra-axial fluid collections. No mass effect. Vascular: No hyperdense vessel or unexpected calcification. Skull: Normal. Negative for fracture or focal lesion. Sinuses/Orbits: Polypoid mucosal thickening left sphenoid sinus. Remaining sinuses are clear. Chronic left mastoid effusion. Other: None. IMPRESSION: 1. Stable less than 3 mm chronic bilateral subdural hematomas or dural thickening. No acute hemorrhage. 2. Chronic small vessel ischemic changes.  No acute infarct. Electronically Signed   By: Sharlet SalinaMichael  Brown M.D.   On: 02/21/2021 15:56   MR BRAIN WO CONTRAST  Result Date: 03/06/2021 CLINICAL DATA:  Neuro deficit, acute, stroke suspected EXAM: MRI HEAD WITHOUT CONTRAST TECHNIQUE: Multiplanar, multiecho pulse sequences of the brain and surrounding structures were obtained without intravenous contrast. COMPARISON:  2019 FINDINGS: DWI, sagittal T1, GRE, and axial T2 sequences were attempted. Motion artifact is present. Patient could not tolerate remainder of study. Brain: There is no acute infarction or intracranial hemorrhage. No intracranial mass or mass effect. Suspect trace bilateral cerebral convexity chronic subdural hematomas. Prominence of the ventricles and sulci reflects generalized parenchymal volume loss patchy and confluent areas of T2 hyperintensity in the supratentorial white matter are nonspecific but may reflect chronic microvascular ischemic changes. Vascular: Major vessel flow voids at the skull base are preserved. Skull and upper cervical spine: Normal marrow signal is preserved. Sinuses/Orbits: Left sphenoid sinus inflammatory changes. Bilateral lens replacements. Other: Sella is unremarkable.  Chronic left mastoid effusion. IMPRESSION: No acute infarction. Chronic microvascular ischemic changes. Trace bilateral cerebral convexity chronic subdural hematomas. Electronically Signed   By: Guadlupe SpanishPraneil  Patel M.D.   On: 03/06/2021 08:50   DG Chest Port 1  View  Result Date: 03/05/2021 CLINICAL DATA:  Altered level of consciousness EXAM: PORTABLE CHEST 1 VIEW COMPARISON:  02/21/2021 FINDINGS: Single frontal view of the chest demonstrates an unremarkable cardiac silhouette. No airspace disease, effusion, or pneumothorax. No acute bony abnormalities. Stable degenerative and postsurgical changes of the shoulders. IMPRESSION: 1. No acute intrathoracic process. Electronically Signed   By: Sharlet SalinaMichael  Brown M.D.   On: 03/05/2021 17:14   DG Chest Portable 1 View  Result Date: 02/21/2021 CLINICAL DATA:  Altered. EXAM: PORTABLE CHEST 1 VIEW COMPARISON:  Radiograph 12/31/2020 FINDINGS: Low lung volumes. Stable heart size and mediastinal contours for technique. Aortic atherosclerosis. Streaky retrocardiac atelectasis. No confluent airspace disease. No pulmonary edema. No pleural effusion or pneumothorax. No acute osseous abnormalities are seen. IMPRESSION: Low lung volumes with retrocardiac atelectasis. Electronically Signed   By: Narda RutherfordMelanie  Sanford M.D.   On: 02/21/2021 17:15   EEG adult  Result Date: 02/25/2021 Charlsie QuestYadav, Priyanka O, MD     02/25/2021  3:00 PM Patient Name: Jachin Coury MRN: [id number] Epilepsy Attending: Charlsie Quest Referring Physician/Provider: Dr Nevin Bloodgood Date: 02/25/2021 Duration: 23.50 mins Patient history: 87 year old male with altered mental status.  EEG to evaluate for seizures. Level of alertness: Awake AEDs during EEG study: None Technical aspects: This EEG study was done with scalp electrodes positioned according to the 10-20 International system of electrode placement. Electrical activity was acquired at a sampling rate of  and reviewed with a high frequency filter of  and a low frequency filter of . EEG data were recorded continuously and digitally stored. Description: The posterior dominant rhythm consists of 8 Hz activity of moderate voltage (25-35 uV) seen predominantly in posterior head regions, symmetric and reactive  to eye opening and eye closing. Drowsiness was characterized by attenuation of the posterior background rhythm.  Patient was noted to have bilateral upper extremity tremors intermittently throughout the EEG.  Concomitant EEG before, during and after the event did not show any EEG changes suggest seizure.  Hyperventilation and photic stimulation were not performed.   IMPRESSION: This study is within normal limits. No seizures or epileptiform discharges were seen throughout the recording. Patient was noted to have marked upper extremity tremors intermittently throughout the EEG without concomitant EEG change.  These episodes were not epileptic. Priyanka O Yadav    DISCHARGE EXAMINATION: Vitals:   03/06/21 0700 03/06/21 0810 03/06/21 0820 03/06/21 1436  BP:    120/62  Pulse:  (!) 104 100 (!) 106  Resp:    18  Temp:    98.8 F (37.1 C)  TempSrc:    Oral  SpO2:    97%  Weight: 72.1 kg     Height:       Patient not very responsive this morning.  Moaning occasionally.   DISPOSITION: Hospice inpatient      Allergies as of Mar 08, 2021   No Known Allergies      Medication List     STOP taking these medications    aspirin EC 81 MG tablet   bismuth subsalicylate 262 MG chewable tablet Commonly known as: PEPTO BISMOL   carbidopa-levodopa 25-100 MG tablet Commonly known as: SINEMET IR   cilostazol 100 MG tablet Commonly known as: PLETAL   divalproex 250 MG DR tablet Commonly known as: DEPAKOTE   donepezil 10 MG tablet Commonly known as: ARICEPT   fluticasone 50 MCG/ACT nasal spray Commonly known as: FLONASE   insulin aspart 100 UNIT/ML injection Commonly known as: novoLOG   insulin detemir 100 UNIT/ML injection Commonly known as: LEVEMIR   loratadine 10 MG tablet Commonly known as: CLARITIN   memantine 5 MG tablet Commonly known as: NAMENDA   multivitamin with minerals Tabs tablet   niacin 500 MG tablet Commonly known as: SLO-NIACIN   Semaglutide 3 MG Tabs    vitamin B-12 500 MCG tablet Commonly known as: CYANOCOBALAMIN   VITAMIN B12 PO       TAKE these medications    bisacodyl 10 MG suppository Commonly known as: DULCOLAX Place 1 suppository (10 mg total) rectally daily as needed for moderate constipation.   glycopyrrolate 1 MG tablet Commonly known as: ROBINUL Take 1 tablet (1 mg total) by mouth every 4 (four) hours as needed (excessive secretions).   glycopyrrolate 0.2 MG/ML injection Commonly known as: ROBINUL Inject 1 mL (0.2 mg total) into the skin every 4 (four) hours as needed (excessive secretions).   glycopyrrolate 0.2 MG/ML injection Commonly known as: ROBINUL Inject 1 mL (0.2 mg total) into the  vein every 4 (four) hours as needed (excessive secretions).   haloperidol 0.5 MG tablet Commonly known as: HALDOL Take 1 tablet (0.5 mg total) by mouth every 4 (four) hours as needed for agitation (or delirium).   haloperidol lactate 5 MG/ML injection Commonly known as: HALDOL Inject 0.1 mLs (0.5 mg total) into the vein every 4 (four) hours as needed (or delirium).   LORazepam 1 MG tablet Commonly known as: ATIVAN Take 1 tablet (1 mg total) by mouth every 4 (four) hours as needed for anxiety.   LORazepam 2 MG/ML injection Commonly known as: ATIVAN Inject 0.5 mLs (1 mg total) into the vein every 4 (four) hours as needed for anxiety.   morphine CONCENTRATE 10 MG/0.5ML Soln concentrated solution Take 0.13-0.25 mLs (2.6-5 mg total) by mouth every 2 (two) hours as needed for moderate pain, anxiety or shortness of breath (EOL care).   ondansetron 4 MG disintegrating tablet Commonly known as: ZOFRAN-ODT Take 1 tablet (4 mg total) by mouth every 6 (six) hours as needed for nausea.   ondansetron 4 MG/2ML Soln injection Commonly known as: ZOFRAN Inject 2 mLs (4 mg total) into the vein every 6 (six) hours as needed for nausea.   polyvinyl alcohol 1.4 % ophthalmic solution Commonly known as: LIQUIFILM TEARS Place 1 drop into  both eyes 4 (four) times daily as needed for dry eyes.           TOTAL DISCHARGE TIME: 25 mins  Soren Lazarz [employer] on [email or web address]  2021-04-06, 12:58 PM

## 2021-03-08 LAB — GLUCOSE, CAPILLARY: Glucose-Capillary: 251 mg/dL — ABNORMAL HIGH (ref 70–99)

## 2021-03-08 MED ORDER — MORPHINE 100MG IN NS 100ML (1MG/ML) PREMIX INFUSION
4.0000 mg/h | INTRAVENOUS | Status: DC
Start: 2021-03-08 — End: 2021-03-08
  Administered 2021-03-08: 4 mg/h via INTRAVENOUS
  Filled 2021-03-08: qty 100

## 2021-03-08 MED ORDER — MORPHINE SULFATE (PF) 2 MG/ML IV SOLN
2.0000 mg | INTRAVENOUS | Status: DC | PRN
Start: 2021-03-08 — End: 2021-03-08

## 2021-03-10 LAB — CULTURE, BLOOD (ROUTINE X 2)
Culture: NO GROWTH
Culture: NO GROWTH
Special Requests: ADEQUATE
Special Requests: ADEQUATE

## 2021-03-17 NOTE — H&P (Signed)
TRH H&P   Patient Demographics:    [name], is a 87 y.o. male  MRN: [id number]   DOB - 1930-01-31  Admit Date - 02/21/2021  Outpatient Primary MD for the patient is [name], FNP  Referring MD/NP/PA: Home    Patient coming from: Home  Chief Complaint  Patient presents with   Altered Mental Status      HPI:    [name]  is a 87 y.o. male, past medical history of diabetes mellitus, insulin-dependent, dementia, GERD, Parkinson disease, hypertension, patient was brought to ED by his wife secondary to ental status, she reports patient with worsening agitation, confusion, progressive over the past week, as well patient with episodes of hypoglycemia, and fall where EMS assessed him for couple times, but patient refused evaluation, patient had wounds to the left buttocks, and left foot, wife report usually patient using walker and wheelchair, but he is with increased falls recently. -In ED his work-up was significant for creatinine of 1.48, platelet of 114 K, hemoglobin of 10.7, urinalysis is positive with significant pyuria and positive leukocyte esterase, his CT head with stable less than 3 mm chronic bilateral subdural hematomas or dural thickening, with no acute hemorrhage.  [facility] hospitalist consulted to admit.    Review of systems:    We will to obtain appropriate review of system given has dementia   With Past History of the following :    Past Medical History:  Diagnosis Date   Arthritis    Dementia (HCC)    early stages   Diabetes mellitus    Eosinophilic colitis    GERD (gastroesophageal reflux disease)    HOH (hard of hearing)    Hypercholesterolemia    Hypertension       Past Surgical History:  Procedure Laterality Date   CATARACT EXTRACTION W/PHACO Left 03/07/2014   Procedure: CATARACT EXTRACTION PHACO AND INTRAOCULAR LENS PLACEMENT LEFT EYE  CDE=7.11;  Surgeon: Loraine Leriche T. Nile Riggs, MD;  Location: [facility];  Service: Ophthalmology;  Laterality: Left;   CATARACT EXTRACTION W/PHACO Right 03/14/2014   Procedure: CATARACT EXTRACTION PHACO AND INTRAOCULAR LENS PLACEMENT RIGHT EYE CDE=10.62;  Surgeon: Loraine Leriche T. Nile Riggs, MD;  Location: [facility];  Service: Ophthalmology;  Laterality: Right;   COLONOSCOPY  2009   Dr. Darrick Penna: pancolonic diverticulosis, internal hemorrhoids   COLONOSCOPY N/A 03/29/2015   RMR: inflammatory changes in the proximal rectum, colon and  terminal ileum suspicious for inflammatory bowel disease. ie. primarily Crohns colitis. Status post biopsy as described. concomitant NSAID exposure could excaerbate pre-existing inflammatory bowel disease but I do not think todays finding are primarly related to recent ibuprofen use.    KNEE ARTHROSCOPY Left    RESECTION DISTAL CLAVICAL Left 10/15/2012   Procedure: RESECTION DISTAL CLAVICAL;  Surgeon: Vickki Hearing, MD;  Location: [facility];  Service: Orthopedics;  Laterality: Left;   SHOULDER ACROMIOPLASTY Left 10/15/2012   Procedure:  SHOULDER ACROMIOPLASTY;  Surgeon: Vickki Hearing, MD;  Location: [facility];  Service: Orthopedics;  Laterality: Left;   SHOULDER ARTHROSCOPY WITH OPEN ROTATOR CUFF REPAIR Left 10/15/2012   Procedure: SHOULDER ARTHROSCOPY WITH OPEN ROTATOR CUFF REPAIR;  Surgeon: Vickki Hearing, MD;  Location: [facility];  Service: Orthopedics;  Laterality: Left;  open at 0912; with bicep tendon debridement;    SHOULDER OPEN ROTATOR CUFF REPAIR Right 08/20/2017   Procedure: ROTATOR CUFF REPAIR SHOULDER OPEN with removal of distal clavicle;  Surgeon: Vickki Hearing, MD;  Location: [facility];  Service: Orthopedics;  Laterality: Right;   TONSILLECTOMY        Social History:     Social History   Tobacco Use   Smoking status: Former    Packs/day: 3.00    Years: 30.00    Pack years: 90.00    Types: Cigarettes    Quit date: 10/11/1993    Years since quitting: 27.4   Smokeless  tobacco: Former   Tobacco comments:    Quit approx 20 years ago  Substance Use Topics   Alcohol use: No    Alcohol/week: 0.0 standard drinks       Family History :     Family History  Problem Relation Age of Onset   Arthritis Other    Cancer Other    Diabetes Other    Cancer Mother    Colon cancer Neg Hx      Home Medications:   Prior to Admission medications   Medication Sig Start Date End Date Taking? Authorizing Provider  amLODipine (NORVASC) 2.5 MG tablet Take 2.5 mg by mouth every morning. 12/11/17  Yes [provider]  aspirin EC 81 MG tablet Take 81 mg by mouth daily.    Yes [provider]  atorvastatin (LIPITOR) 40 MG tablet Take 40 mg by mouth at bedtime. 12/26/20  Yes [provider]  bismuth subsalicylate (PEPTO BISMOL) 262 MG chewable tablet Chew 262 mg by mouth daily as needed for diarrhea or loose stools.   Yes [provider]  carbidopa-levodopa (SINEMET IR) 25-100 MG tablet Take 1 tablet by mouth 3 (three) times daily. 12/06/20  Yes [provider]  cilostazol (PLETAL) 100 MG tablet TAKE 1 TABLET(100 MG) BY MOUTH TWICE DAILY BEFORE A MEAL Patient taking differently: Take 100 mg by mouth daily. 03/26/20  Yes Maeola Harman, MD  Cyanocobalamin (VITAMIN B12 PO) Take 1 tablet by mouth every morning.   Yes [provider]  divalproex (DEPAKOTE) 250 MG DR tablet Take 250 mg by mouth 3 (three) times daily. 01/04/21  Yes [provider]  donepezil (ARICEPT) 10 MG tablet Take 10 mg by mouth at bedtime.  07/16/17  Yes [provider]  fish oil-omega-3 fatty acids 1000 MG capsule Take 1 g by mouth 2 (two) times daily.    Yes [provider]  fluticasone (FLONASE) 50 MCG/ACT nasal spray Place 2 sprays into both nostrils daily as needed for allergies.   Yes [provider]  insulin aspart (NOVOLOG) 100 UNIT/ML injection Inject 24-36 Units into the skin See admin instructions. 24  units at lunch time, and 36 units at dinner *Do not take if levels are under 150   Yes [provider]  insulin detemir (LEVEMIR) 100 UNIT/ML injection Inject 36-55 Units into the skin 2 (two) times daily. 55 units each morning, and 36 units at bedtime   Yes [provider]  lisinopril (PRINIVIL,ZESTRIL) 40 MG tablet Take 20 mg by mouth  daily.   Yes [provider]  loperamide (IMODIUM A-D) 2 MG tablet Take 2 mg by mouth as needed for diarrhea or loose stools.   Yes [provider]  loratadine (CLARITIN) 10 MG tablet Take 10 mg by mouth daily.   Yes [provider]  meloxicam (MOBIC) 7.5 MG tablet TAKE 1 TABLET(7.5 MG) BY MOUTH DAILY Patient taking differently: Take 7.5 mg by mouth every morning. 09/03/20  Yes Vickki Hearing, MD  memantine (NAMENDA) 5 MG tablet Take 5 mg by mouth 2 (two) times daily.   Yes [provider]  Multiple Vitamin (MULTIVITAMIN WITH MINERALS) TABS tablet Take 1 tablet by mouth every morning.   Yes [provider]  niacin (SLO-NIACIN) 500 MG tablet Take 500 mg by mouth at bedtime.   Yes [provider]  Omega-3 Fatty Acids (FISH OIL) 1000 MG CAPS Take 1 capsule by mouth daily.   Yes [provider]  pantoprazole (PROTONIX) 40 MG tablet Take 40 mg by mouth daily.   Yes [provider]  QUEtiapine (SEROQUEL) 25 MG tablet Take 25 mg by mouth at bedtime. 12/05/20  Yes [provider]  Semaglutide 3 MG TABS Take 3 mg by mouth every morning.   Yes [provider]     Allergies:    No Known Allergies   Physical Exam:   Vitals  Blood pressure 117/63, pulse 93, temperature 98.2 F (36.8 C), resp. rate 19, height 5\' 10"  (1.778 m), weight 83 kg, SpO2 100 %.   1. General Trembly frail deconditioned male, laying in bed, no apparent distress  2.  Patient is demented, awake, alert to name only , significantly impaired cognition and insight .  3. No F.N deficits, ALL  C.Nerves Intact, fingers all extremities with no gross deficits  4. Ears and Eyes appear Normal, Conjunctivae clear, PERRLA.dry  Oral Mucosa.  5. Supple Neck, No JVD, No cervical lymphadenopathy appriciated, No Carotid Bruits.  6. Symmetrical Chest wall movement, Good air movement bilaterally, CTAB.  7. RRR, No Gallops, Rubs or Murmurs, No Parasternal Heave.  8. Positive Bowel Sounds, Abdomen Soft, No tenderness, No organomegaly appriciated,No rebound -guarding or rigidity.  9.  No Cyanosis, Normal Skin Turgor, No Skin Rash or Bruise.  10.  joints appear normal , no effusions, Normal ROM.  11. No Palpable Lymph Nodes in Neck or Axillae   Data Review:    CBC No results for input(s): WBC, HGB, HCT, PLT, MCV, MCH, MCHC, RDW, LYMPHSABS, MONOABS, EOSABS, BASOSABS, BANDABS in the last 168 hours.  Invalid input(s): NEUTRABS, BANDSABD  ------------------------------------------------------------------------------------------------------------------  Chemistries  No results for input(s): NA, K, CL, CO2, GLUCOSE, BUN, CREATININE, CALCIUM, MG, AST, ALT, ALKPHOS, BILITOT in the last 168 hours.  Invalid input(s): GFRCGP  ------------------------------------------------------------------------------------------------------------------ estimated creatinine clearance is 24.4 mL/min (A) (by C-G formula based on SCr of 2.12 mg/dL (H)). ------------------------------------------------------------------------------------------------------------------ No results for input(s): TSH, T4TOTAL, T3FREE, THYROIDAB in the last 72 hours.  Invalid input(s): FREET3   Coagulation profile No results for input(s): INR, PROTIME in the last 168 hours. ------------------------------------------------------------------------------------------------------------------- No results for input(s): DDIMER in the last 72  hours. -------------------------------------------------------------------------------------------------------------------  Cardiac Enzymes No results for input(s): CKMB, TROPONINI, MYOGLOBIN in the last 168 hours.  Invalid input(s): CK ------------------------------------------------------------------------------------------------------------------ No results found for: BNP   ---------------------------------------------------------------------------------------------------------------  Urinalysis    Component Value Date/Time   COLORURINE YELLOW 03/05/2021 1920   APPEARANCEUR CLEAR 03/05/2021 1920   LABSPEC 1.015 03/05/2021 1920   PHURINE 5.0 03/05/2021 1920   GLUCOSEU  50 (A) 03/05/2021 1920   HGBUR MODERATE (A) 03/05/2021 1920   BILIRUBINUR NEGATIVE 03/05/2021 1920   KETONESUR 5 (A) 03/05/2021 1920   PROTEINUR NEGATIVE 03/05/2021 1920   UROBILINOGEN 0.2 03/26/2015 1325   NITRITE NEGATIVE 03/05/2021 1920   LEUKOCYTESUR NEGATIVE 03/05/2021 1920    ----------------------------------------------------------------------------------------------------------------   Imaging Results:    No results found.  My personal review of EKG:   Vent. rate 99 BPM PR interval 177 ms QRS duration 132 ms QT/QTcB 378/486 ms P-R-T axes 32 -74 41 Sinus rhythm RBBB and LAFB    Assessment & Plan:    Active Problems:   AKI (acute kidney injury) (HCC)   Diabetes (HCC)   Failure to thrive in adult   Dementia due to Parkinson's disease with behavioral disturbance (HCC)   Parkinson disease (HCC)   Dementia associated with other underlying disease with behavioral disturbance (HCC)   Acute lower UTI   Pressure injury of skin   Encephalopathy    Failure to thrive -Patient brought by his wife due to to worsening mentation, less interactive, poor oral intake. -His baseline most likely worsened due to UTI, but it does appear he is with poor baseline given his dementia and Parkinson's  disease -Consult PT/OT, likely will need subacute rehab as discussed with wife -Aute metabolic encephalopathy due to UTI, ET head with no acute findings (old name versus subdural hematoma), ammonia WNL.   UTI -Patient altered, cannot provide any complaints, will send urine culture and start on Rocephin  Parkinson disease -We will continue with home medications include Sinemet  Dementia -Continue with home medication include Depakote, Aricept and Namenda -We will hold Seroquel given altered mental status  Diabetes mellitus -Resume Lantus at a lower dose given oral intake is unpredictable, will add insulin sliding scale, will hold semaglutide  Hypertension  - continue with home medications  Hyperlipidemia - continue with statin  GERD - on PPI  CKD stage III  -At baseline  PVD  -continue with cilostazol and statin  CT head showing stable less than 3 mm chronic bilateral subdural hematomas or dural thickening. No acute hemorrhage.  DVT Prophylaxis Heparin  AM Labs Ordered, also please review Full Orders  Family Communication: Admission, patients condition and plan of care including tests being ordered have been discussed with wife at bedside who indicate understanding and agree with the plan and Code Status.  Code Status DNR confirmed by wife  Likely DC to likely will need SNF placement  Condition GUARDED    Consults called: None  Admission status: Observation  Time spent in minutes : 60 minutes   Huey Bienenstock M.D on 03/17/2021 at 8:56 AM   [facility] - Office  [phone]

## 2021-04-04 NOTE — Progress Notes (Signed)
Patient tachypnic.  Hospice nurse at the bedside.  Family notified.  1  mg IV Morphine given Morphine drip ordered 1 mg IV Ativan given   Will continue to monitor.

## 2021-04-04 NOTE — Progress Notes (Signed)
Chaplain engaged in an initial visit with [name]' family as they grieved the loss of [name].  Chaplain was able to learn about [name] through his wife and two daughters.  He was a hardworking father, husband and grandfather.  He hated to be idle and loved to be outside in nature.  His grandchildren loved riding his tractor.  [name] shared that they have been married over 60 years and that she was his primary caregiver.  [name] became sicker in the last three years in battling several health issues.  His family finds solace in him no longer having to suffer.  Chaplain offered prayer, empathic listening, and support to family.      03/07/2021 1300  Clinical Encounter Type  Visited With Patient and family together  Visit Type Death;Spiritual support  Referral From Family;Nurse  Spiritual Encounters  Spiritual Needs Grief support;Prayer

## 2021-04-04 NOTE — Progress Notes (Signed)
Patient expired at 1230 with family at his bedside.  The chaplain was contacted and came to pray with the family.   Body prepared and sent to the morgue.

## 2021-04-04 NOTE — Progress Notes (Signed)
Nutrition Brief Note  Chart reviewed. RD drawn to patient due to Low Braden.  Pt transitioning to comfort care.   No nutrition interventions planned at this time.    Royann Shivers MS, RD, LDN,CSG Contact: Loretha Stapler

## 2021-04-04 NOTE — Progress Notes (Signed)
Patient noted to be unresponsive.  He is noted to be grimacing.  Noted to be tachypneic. Coarse breath sounds bilaterally. S1-S2 is tachycardic regular Abdomen soft Patient is unresponsive.  This is a 87 year old male with a past medical history of Parkinson's disease, advanced dementia secondary to Parkinson's, history of insulin-dependent diabetes mellitus who has had recurrent hospitalizations for dehydration and altered mental status.  Recently discharged after stay in the hospital last week.  Presented with similar issues.  Discussions were held with family.  Palliative care was consulted.  Patient was transitioned to comfort care.  Patient noted to be tachypneic this morning.  He has been getting morphine.  We will change it to infusion to provide him more comfort as he does appear to be struggling to breathe.    Discussed with nursing staff.  Quite likely the patient will pass away within the next 24 hours.  We will continue to follow.  [name] 04/03/2021

## 2021-04-04 NOTE — Death Summary Note (Signed)
DEATH SUMMARY   Patient Details  Name: [name] MRN: [id number] DOB: [birth year]  Admission/Discharge Information   Admit Date:  15-Mar-2021  Date of Death: Date of Death: 2021/03/16  Time of Death: Time of Death: November 18, 1228  Length of Stay: 1  Referring Physician: [name], FNP   Reason(s) for Hospitalization  Dementia associated with Parkinson's disease Acute metabolic encephalopathy  Diagnoses   Preliminary cause of death: Dementia associated with Parkinson's disease (HCC)  Secondary Diagnoses (including complications and co-morbidities):  Active Problems:   Dementia due to Parkinson's disease with behavioral disturbance (HCC)   Acute metabolic encephalopathy   Dementia [facility])   Brief Hospital Course (including significant findings, care, treatment, and services provided and events leading to death)   Brief HPI: [name] is a 87 y.o. male with medical history significant for IDDM, GERD, Parkinson disease, essential hypertension and dementia who presented to the emergency department from [facility] via EMS due to altered mental status.  Patient was recently hospitalized about a week ago with a similar issues.  At that time symptoms were thought to be due to UTI and underlying dementia.  He was discharged to skilled nursing facility.  In the emergency department patient was started on broad-spectrum antibiotics and was hospitalized again. Subsequently transitioned to comfort care and to hospice.  Hospital Course  Acute metabolic encephalopathy/SIRS Recently hospitalized for similar presentation.  At that time he was found to have UTI.  At the time of presentation he was noted to be tachycardic.  Had elevated WBC of 23.1.  However urine analysis was unimpressive for UTI.  Empirically started on vancomycin and cefepime.  Procalcitonin only 0.19.  Lactic acid level was 3.7 improving to 2.7.  Influenza and COVID-19 test were negative.  Patient's antibacterials were continued.   Discussions were held with patient's family.  Palliative care was consulted again.  After discussions with family patient was changed over to comfort care.  Patient was waiting to go to residential hospice however his condition worsened and he expired on 03/16/21 at 12:30 PM.   Acute kidney injury/hypernatremia Patient had normal creatinine on July 27.  Presented with elevated BUN of 94 and creatinine of 3.22.  Most likely this is secondary to poor oral intake.  This is all secondary to his underlying Parkinson's disease and advanced dementia.    Lactic acidosis   Thrombocytopenia Low counts could be due to acute illness.     Diabetes mellitus type 2, uncontrolled with hyperglycemia     History of Parkinson's disease with dementia Appears to be primary reason for his frequent decompensation.   History of peripheral vascular disease Stable.  History of chronic subdural hematoma Stable.    Pressure injury stage II Pressure Injury 02/21/21 Buttocks Left;Medial Stage 2 -  Partial thickness loss of dermis presenting as a shallow open injury with a red, pink wound bed without slough. nickel sized, pink wound base, no drainage noted (Active)  02/21/21 1845  Location: Buttocks  Location Orientation: Left;Medial  Staging: Stage 2 -  Partial thickness loss of dermis presenting as a shallow open injury with a red, pink wound bed without slough.  Wound Description (Comments): nickel sized, pink wound base, no drainage noted  Present on Admission: Yes     Pressure Injury 02/21/21 Buttocks Right;Medial Stage 2 -  Partial thickness loss of dermis presenting as a shallow open injury with a red, pink wound bed without slough. pencil eraser sized, pink base, no drainage (Active)  02/21/21 1845  Location: Buttocks  Location Orientation: Right;Medial  Staging: Stage 2 -  Partial thickness loss of dermis presenting as a shallow open injury with a red, pink wound bed without slough.  Wound  Description (Comments): pencil eraser sized, pink base, no drainage  Present on Admission: Yes      Pertinent Labs and Studies  Significant Diagnostic Studies CT HEAD WO CONTRAST  Result Date: 03/05/2021 CLINICAL DATA:  Altered level of consciousness today EXAM: CT HEAD WITHOUT CONTRAST TECHNIQUE: Contiguous axial images were obtained from the base of the skull through the vertex without intravenous contrast. COMPARISON:  02/24/2021 FINDINGS: Brain: No acute infarct or hemorrhage. There are stable chronic bilateral subdural hematomas measuring up to 3 mm each, without mass effect. Chronic small vessel ischemic changes are seen within the periventricular white matter. The lateral ventricles and midline structures are unremarkable. Vascular: No hyperdense vessel or unexpected calcification. Skull: Normal. Negative for fracture or focal lesion. Sinuses/Orbits: Near complete opacification of the left sphenoid sinus. Remaining sinuses are clear. Other: None. IMPRESSION: 1. Stable chronic bilateral subdural hematomas measuring 3 mm, without mass effect. 2. No acute intracranial process. 3. Sphenoid sinus disease. Electronically Signed   By: Sharlet Salina M.D.   On: 03/05/2021 18:57   CT HEAD WO CONTRAST  Result Date: 02/24/2021 CLINICAL DATA:  Follow-up subdural collection EXAM: CT HEAD WITHOUT CONTRAST TECHNIQUE: Contiguous axial images were obtained from the base of the skull through the vertex without intravenous contrast. COMPARISON:  Three days ago FINDINGS: Brain: Bilateral intermediate to low-density subdural collection/thickening measuring up to 3 mm in thickness and not causing any mass effect on the atrophic brain. No high-density/acute hemorrhage. No evidence of infarct, mass, or hydrocephalus. Generalized atrophy and chronic small vessel ischemia Vascular: No hyperdense vessel or unexpected calcification. Skull: Normal. Negative for fracture or focal lesion. Sinuses/Orbits: No acute finding.  Mucosal thickening in the left sphenoid sinus. IMPRESSION: Stable nonacute bilateral subdural hematoma without cerebral mass effect. No new abnormality. Electronically Signed   By: Marnee Spring M.D.   On: 02/24/2021 10:15   CT Head Wo Contrast  Result Date: 02/21/2021 CLINICAL DATA:  Dementia, Parkinson's disease, hypoglycemia, altered level of consciousness EXAM: CT HEAD WITHOUT CONTRAST TECHNIQUE: Contiguous axial images were obtained from the base of the skull through the vertex without intravenous contrast. COMPARISON:  06/05/2020 FINDINGS: Brain: No acute infarct or hemorrhage. Chronic small vessel ischemic changes are seen within the periventricular white matter. Lateral ventricles and midline structures are unremarkable. Stable bilateral less than 3 mm chronic subdural hematomas are again noted along the convexities. No acute extra-axial fluid collections. No mass effect. Vascular: No hyperdense vessel or unexpected calcification. Skull: Normal. Negative for fracture or focal lesion. Sinuses/Orbits: Polypoid mucosal thickening left sphenoid sinus. Remaining sinuses are clear. Chronic left mastoid effusion. Other: None. IMPRESSION: 1. Stable less than 3 mm chronic bilateral subdural hematomas or dural thickening. No acute hemorrhage. 2. Chronic small vessel ischemic changes.  No acute infarct. Electronically Signed   By: Sharlet Salina M.D.   On: 02/21/2021 15:56   MR BRAIN WO CONTRAST  Result Date: 03/06/2021 CLINICAL DATA:  Neuro deficit, acute, stroke suspected EXAM: MRI HEAD WITHOUT CONTRAST TECHNIQUE: Multiplanar, multiecho pulse sequences of the brain and surrounding structures were obtained without intravenous contrast. COMPARISON:  2019 FINDINGS: DWI, sagittal T1, GRE, and axial T2 sequences were attempted. Motion artifact is present. Patient could not tolerate remainder of study. Brain: There is no acute infarction or intracranial hemorrhage. No intracranial mass or mass effect. Suspect  trace  bilateral cerebral convexity chronic subdural hematomas. Prominence of the ventricles and sulci reflects generalized parenchymal volume loss patchy and confluent areas of T2 hyperintensity in the supratentorial white matter are nonspecific but may reflect chronic microvascular ischemic changes. Vascular: Major vessel flow voids at the skull base are preserved. Skull and upper cervical spine: Normal marrow signal is preserved. Sinuses/Orbits: Left sphenoid sinus inflammatory changes. Bilateral lens replacements. Other: Sella is unremarkable.  Chronic left mastoid effusion. IMPRESSION: No acute infarction. Chronic microvascular ischemic changes. Trace bilateral cerebral convexity chronic subdural hematomas. Electronically Signed   By: Guadlupe SpanishPraneil  Patel M.D.   On: 03/06/2021 08:50   DG Chest Port 1 View  Result Date: 03/05/2021 CLINICAL DATA:  Altered level of consciousness EXAM: PORTABLE CHEST 1 VIEW COMPARISON:  02/21/2021 FINDINGS: Single frontal view of the chest demonstrates an unremarkable cardiac silhouette. No airspace disease, effusion, or pneumothorax. No acute bony abnormalities. Stable degenerative and postsurgical changes of the shoulders. IMPRESSION: 1. No acute intrathoracic process. Electronically Signed   By: Sharlet SalinaMichael  Brown M.D.   On: 03/05/2021 17:14   DG Chest Portable 1 View  Result Date: 02/21/2021 CLINICAL DATA:  Altered. EXAM: PORTABLE CHEST 1 VIEW COMPARISON:  Radiograph 12/31/2020 FINDINGS: Low lung volumes. Stable heart size and mediastinal contours for technique. Aortic atherosclerosis. Streaky retrocardiac atelectasis. No confluent airspace disease. No pulmonary edema. No pleural effusion or pneumothorax. No acute osseous abnormalities are seen. IMPRESSION: Low lung volumes with retrocardiac atelectasis. Electronically Signed   By: Narda RutherfordMelanie  Sanford M.D.   On: 02/21/2021 17:15   EEG adult  Result Date: 02/25/2021 Charlsie QuestYadav, Priyanka O, MD     02/25/2021  3:00 PM Patient Name: [name] MRN: [id number] Epilepsy Attending: Charlsie QuestPriyanka O Yadav Referring Physician/Provider: Dr Nevin BloodgoodShahmehdi, Seyed Date: 02/25/2021 Duration: 23.50 mins Patient history: 87 year old male with altered mental status.  EEG to evaluate for seizures. Level of alertness: Awake AEDs during EEG study: None Technical aspects: This EEG study was done with scalp electrodes positioned according to the 10-20 International system of electrode placement. Electrical activity was acquired at a sampling rate of 500Hz  and reviewed with a high frequency filter of 70Hz  and a low frequency filter of 1Hz . EEG data were recorded continuously and digitally stored. Description: The posterior dominant rhythm consists of 8 Hz activity of moderate voltage (25-35 uV) seen predominantly in posterior head regions, symmetric and reactive to eye opening and eye closing. Drowsiness was characterized by attenuation of the posterior background rhythm.  Patient was noted to have bilateral upper extremity tremors intermittently throughout the EEG.  Concomitant EEG before, during and after the event did not show any EEG changes suggest seizure.  Hyperventilation and photic stimulation were not performed.   IMPRESSION: This study is within normal limits. No seizures or epileptiform discharges were seen throughout the recording. Patient was noted to have marked upper extremity tremors intermittently throughout the EEG without concomitant EEG change.  These episodes were not epileptic. Charlsie QuestPriyanka O Yadav    Microbiology Recent Results (from the past 240 hour(s))  Culture, blood (routine x 2)     Status: None (Preliminary result)   Collection Time: 03/05/21  4:22 PM   Specimen: BLOOD LEFT HAND  Result Value Ref Range Status   Specimen Description BLOOD LEFT HAND  Final   Special Requests   Final    BOTTLES DRAWN AEROBIC AND ANAEROBIC Blood Culture adequate volume   Culture   Final    NO GROWTH 4 DAYS Performed at [facility], [address]  [place], [place] [address]    Report Status PENDING  Incomplete  Culture, blood (routine x 2)     Status: None (Preliminary result)   Collection Time: 03/05/21  4:22 PM   Specimen: Right Antecubital; Blood  Result Value Ref Range Status   Specimen Description RIGHT ANTECUBITAL  Final   Special Requests   Final    BOTTLES DRAWN AEROBIC AND ANAEROBIC Blood Culture adequate volume   Culture   Final    NO GROWTH 4 DAYS Performed at [facility], [address] [place], [place] [address]    Report Status PENDING  Incomplete  Resp Panel by RT-PCR (Flu A&B, Covid) Nasopharyngeal Swab     Status: None   Collection Time: 03/05/21  7:02 PM   Specimen: Nasopharyngeal Swab; Nasopharyngeal(NP) swabs in vial transport medium  Result Value Ref Range Status   SARS Coronavirus 2 by RT PCR NEGATIVE NEGATIVE Final    Comment: (NOTE) SARS-CoV-2 target nucleic acids are NOT DETECTED.  The SARS-CoV-2 RNA is generally detectable in upper respiratory specimens during the acute phase of infection. The lowest concentration of SARS-CoV-2 viral copies this assay can detect is 138 copies/mL. A negative result does not preclude SARS-Cov-2 infection and should not be used as the sole basis for treatment or other patient management decisions. A negative result may occur with  improper specimen collection/handling, submission of specimen other than nasopharyngeal swab, presence of viral mutation(s) within the areas targeted by this assay, and inadequate number of viral copies(<138 copies/mL). A negative result must be combined with clinical observations, patient history, and epidemiological information. The expected result is Negative.  Fact Sheet for Patients:  BloggerCourse.com  Fact Sheet for Healthcare Providers:  SeriousBroker.it  This test is no t yet approved or cleared by the [place] FDA and  has been authorized for detection and/or diagnosis of  SARS-CoV-2 by FDA under an Emergency Use Authorization (EUA). This EUA will remain  in effect (meaning this test can be used) for the duration of the COVID-19 declaration under Section 564(b)(1) of the Act, 21 U.S.C.section 360bbb-3(b)(1), unless the authorization is terminated  or revoked sooner.       Influenza A by PCR NEGATIVE NEGATIVE Final   Influenza B by PCR NEGATIVE NEGATIVE Final    Comment: (NOTE) The Xpert Xpress SARS-CoV-2/FLU/RSV plus assay is intended as an aid in the diagnosis of influenza from Nasopharyngeal swab specimens and should not be used as a sole basis for treatment. Nasal washings and aspirates are unacceptable for Xpert Xpress SARS-CoV-2/FLU/RSV testing.  Fact Sheet for Patients: BloggerCourse.com  Fact Sheet for Healthcare Providers: SeriousBroker.it  This test is not yet approved or cleared by the [place] FDA and has been authorized for detection and/or diagnosis of SARS-CoV-2 by FDA under an Emergency Use Authorization (EUA). This EUA will remain in effect (meaning this test can be used) for the duration of the COVID-19 declaration under Section 564(b)(1) of the Act, 21 U.S.C. section 360bbb-3(b)(1), unless the authorization is terminated or revoked.  Performed at [facility], [address], [place], [place] [address]     Lab Basic Metabolic Panel: Recent Labs  Lab 03/05/21 1622 03/06/21 0019 03/06/21 0532 03/06/21 0612  NA 148* 152*  --  154*  K 4.5 4.5  --  4.1  CL 116* 123*  --  125*  CO2 20* 21*  --  21*  GLUCOSE 294* 246*  --  184*  BUN 94* 83*  --  73*  CREATININE 3.22* 2.71*  --  2.12*  CALCIUM 9.3 8.1*  --  8.3*  MG  --   --  2.3  --   PHOS  --   --  5.2*  --    Liver Function Tests: Recent Labs  Lab 03/05/21 1622 03/06/21 0612  AST 28 40  ALT 12 22  ALKPHOS 69 58  BILITOT 1.1 0.8  PROT 7.7 6.0*  ALBUMIN 4.2 3.2*    CBC: Recent Labs  Lab 03/05/21 1622  03/06/21 0532  WBC 23.1* 17.6*  NEUTROABS 17.0*  --   HGB 15.4 12.9*  HCT 49.0 44.1  MCV 91.6 96.7  PLT 142* 84*    Sepsis Labs: Recent Labs  Lab 03/05/21 1622 03/05/21 1848 03/06/21 0019 03/06/21 0532  PROCALCITON  --   --   --  0.19  WBC 23.1*  --   --  17.6*  LATICACIDVEN 1.0 2.2* 3.7* 2.7*      [name] 03/09/2021, 2:45 PM

## 2021-04-04 DEATH — deceased

## 2022-08-07 IMAGING — CT CT HEAD W/O CM
4 of 5 series · 15 of 47 positions shown, 17 images · non-contrast
Comparison: Head CT October 31, 2019.

CLINICAL DATA: Repeated falls.

EXAM:
CT HEAD WITHOUT CONTRAST
TECHNIQUE: Contiguous axial images were obtained from the base of the skull
through the vertex without intravenous contrast.

[Series 2: head w o · axial · 0.41mm/px · z∈[+90,+185]mm · 4 of 33 slices shown]
[im 7/33  brain]
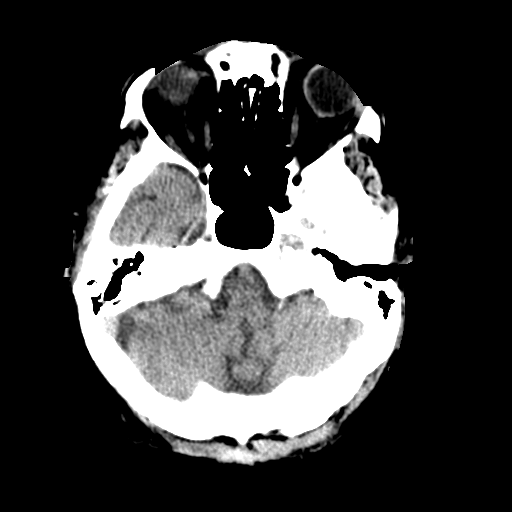
[im 13/33  brain]
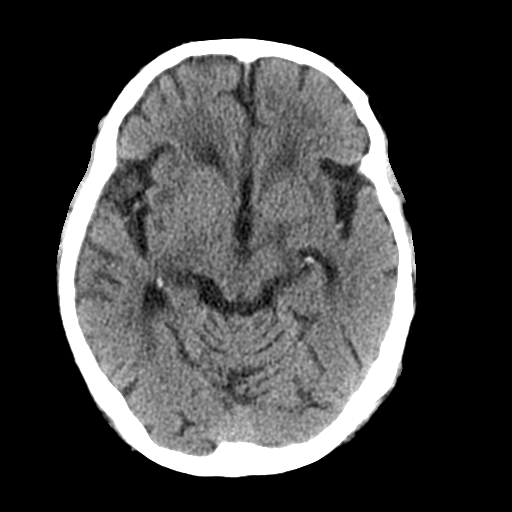
[im 20/33  brain]
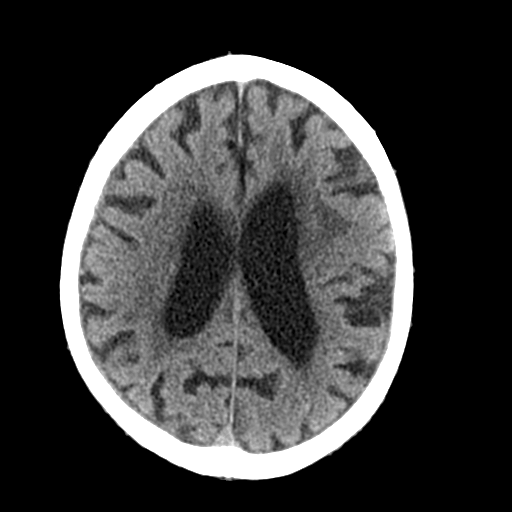
[im 26/33  brain]
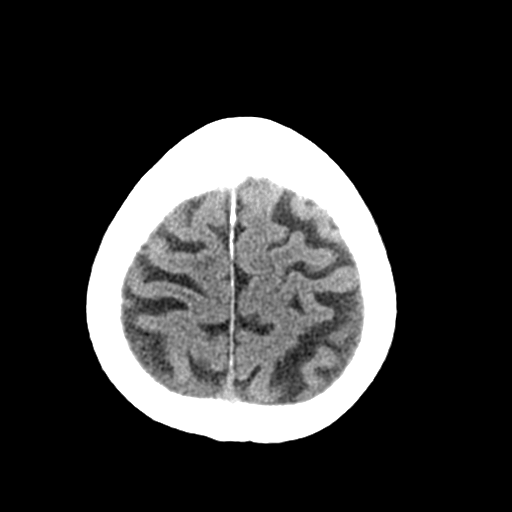

[Series 4: coronal soft · coronal · 0.33mm/px · 3 of 66 slices shown]
[im 22/66  brain]
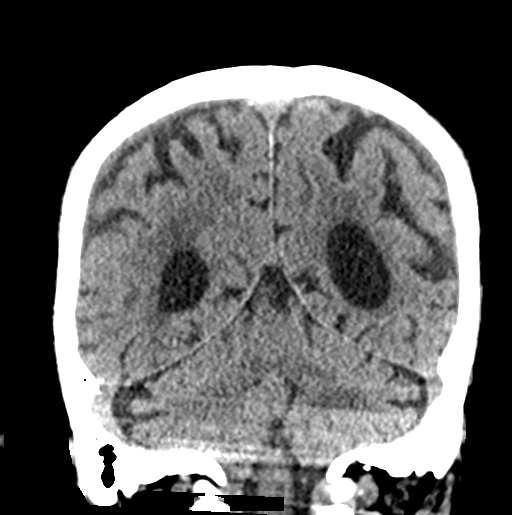
[im 29/66  brain]
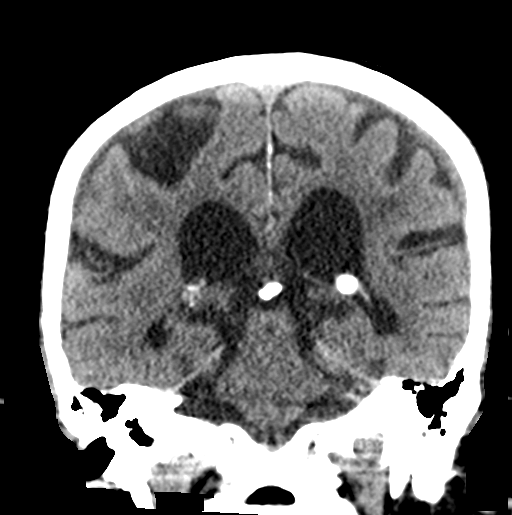
[im 37/66  brain]
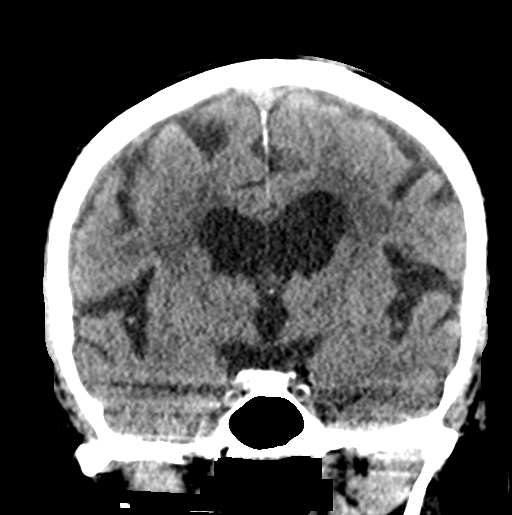

[Series 5: sagittal soft · sagittal · 0.33mm/px · 3 of 56 slices shown]
[im 19/56  brain]
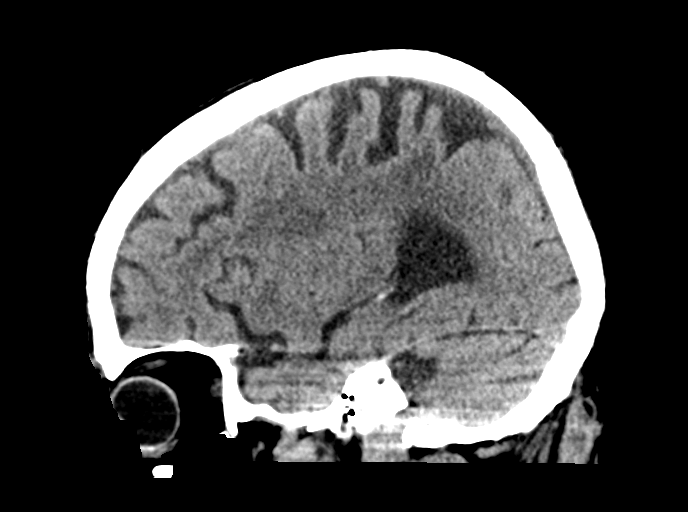
[im 28/56  brain]
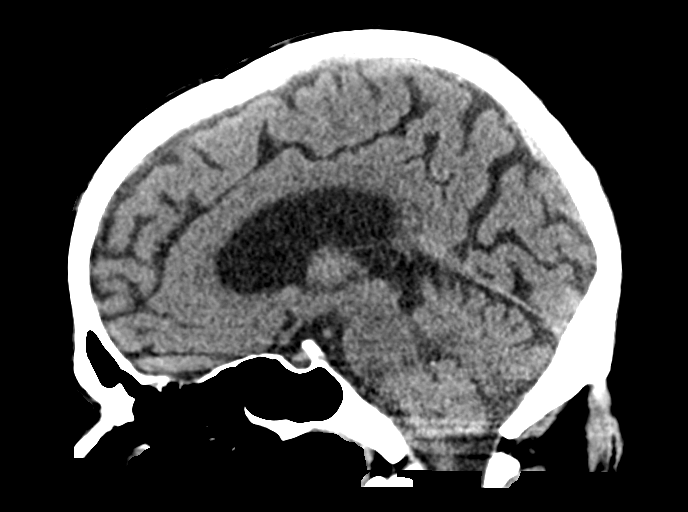
[im 37/56  brain]
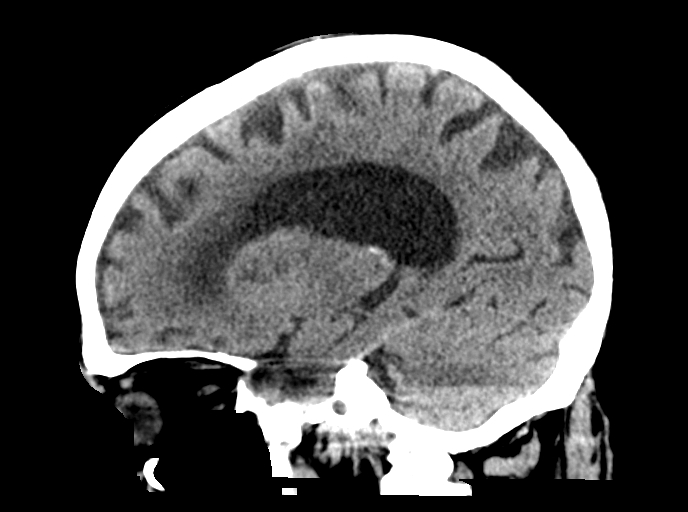

[Series 6: head ax w o · axial · 0.33mm/px · z∈[+75,+183]mm · 5 of 34 slices shown, 7 images]
[im 6/34  brain]
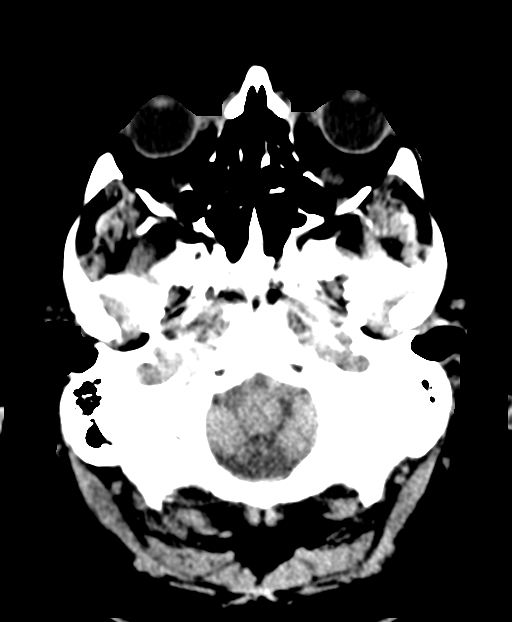
[im 6/34  bone]
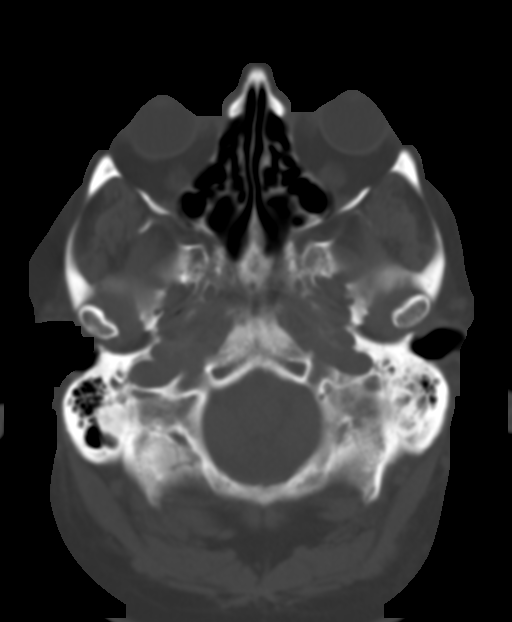
[im 12/34  brain]
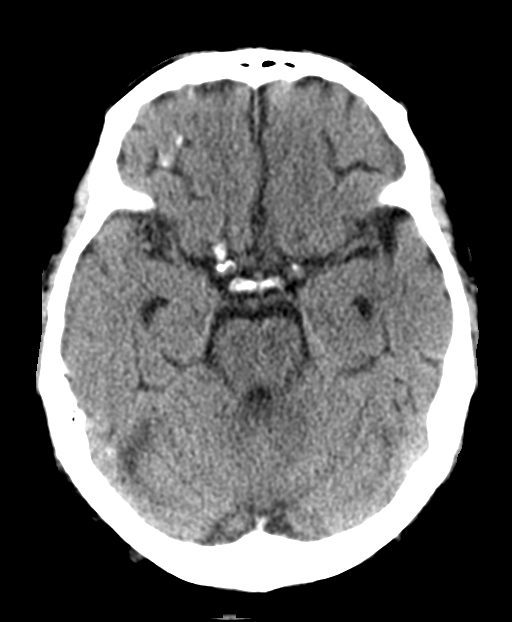
[im 17/34  brain]
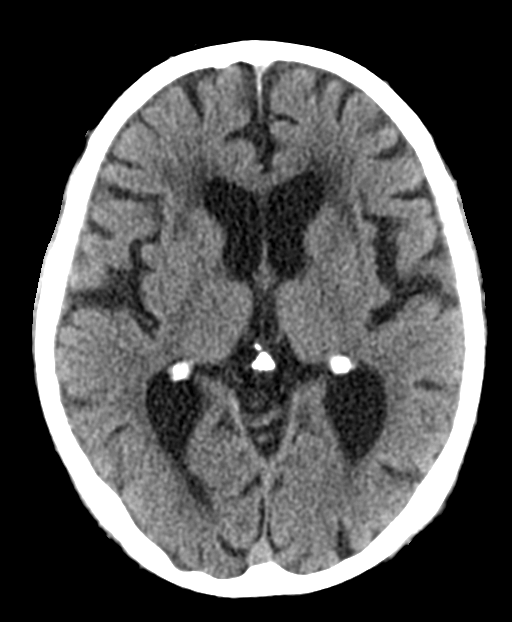
[im 23/34  brain]
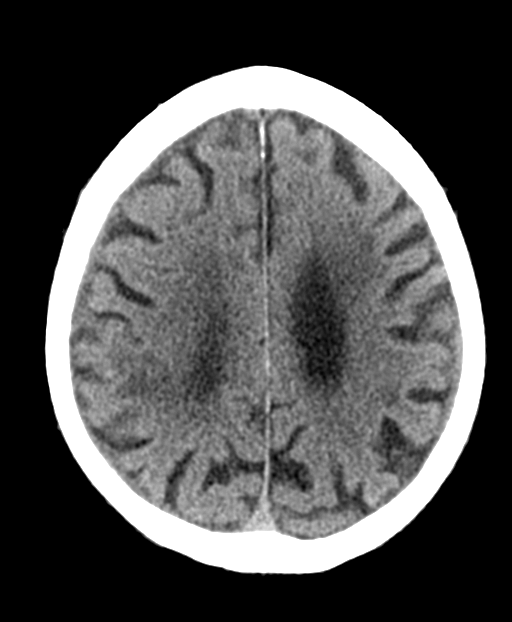
[im 28/34  brain]
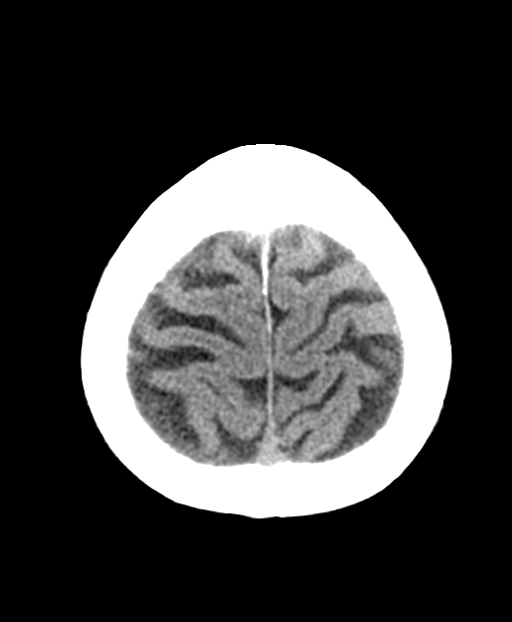
[im 28/34  bone]
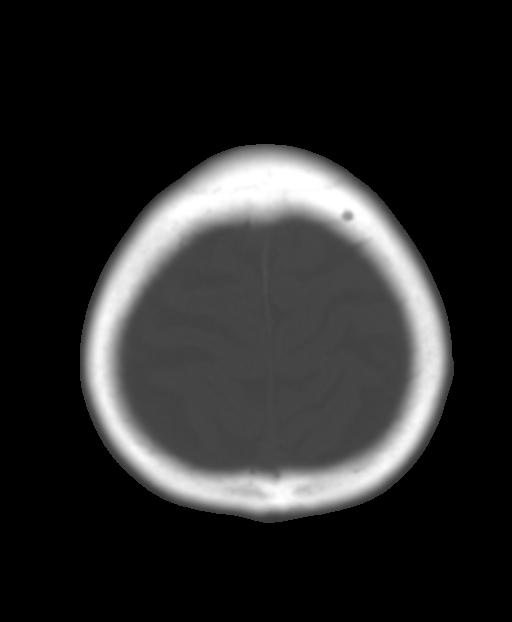

[15 of 47 positions shown; findings below may reference images not displayed]

FINDINGS: Brain: No evidence of acute infarction, hemorrhage, hydrocephalus or
mass lesion/mass effect.

Stable appearance of bilateral thin chronic subdural hematomas in
the cerebral convexities, measuring up to 3 mm on the right and 4 mm
on the left. There is no mass effect on the brain parenchyma.

Hypodensities within the white matter of the cerebral hemispheres,
predominantly periventricular, nonspecific, most likely related to
chronic small vessel ischemia, stable from prior. Moderate
parenchymal volume loss.

Vascular: Densely calcified plaques in the bilateral carotid
siphons.

Skull: Normal. Negative for fracture or focal lesion.

Sinuses/Orbits: No acute finding.

Other: None.
IMPRESSION: 1. No acute intracranial findings.
2. Stable appearance of bilateral thin chronic subdural hematomas.
3. Moderate parenchymal volume loss and chronic small vessel
ischemia.
